# Patient Record
Sex: Female | Born: 1955 | Race: Black or African American | Hispanic: No | Marital: Single | Smoking: Never smoker
Health system: Southern US, Community
[De-identification: ages and names within clinical notes are randomized; demographics above are authoritative.]

## PROBLEM LIST (undated history)

## (undated) DIAGNOSIS — D649 Anemia, unspecified: Secondary | ICD-10-CM

## (undated) DIAGNOSIS — C787 Secondary malignant neoplasm of liver and intrahepatic bile duct: Secondary | ICD-10-CM

## (undated) DIAGNOSIS — R011 Cardiac murmur, unspecified: Secondary | ICD-10-CM

## (undated) DIAGNOSIS — L309 Dermatitis, unspecified: Secondary | ICD-10-CM

## (undated) DIAGNOSIS — I10 Essential (primary) hypertension: Secondary | ICD-10-CM

## (undated) DIAGNOSIS — Z923 Personal history of irradiation: Secondary | ICD-10-CM

## (undated) DIAGNOSIS — Z9289 Personal history of other medical treatment: Secondary | ICD-10-CM

## (undated) DIAGNOSIS — K921 Melena: Secondary | ICD-10-CM

## (undated) DIAGNOSIS — C801 Malignant (primary) neoplasm, unspecified: Secondary | ICD-10-CM

## (undated) DIAGNOSIS — C189 Malignant neoplasm of colon, unspecified: Secondary | ICD-10-CM

## (undated) DIAGNOSIS — G709 Myoneural disorder, unspecified: Secondary | ICD-10-CM

## (undated) DIAGNOSIS — F419 Anxiety disorder, unspecified: Secondary | ICD-10-CM

## (undated) HISTORY — PX: ABDOMINAL HYSTERECTOMY: SHX81

## (undated) HISTORY — PX: BREAST LUMPECTOMY: SHX2

## (undated) HISTORY — DX: Malignant (primary) neoplasm, unspecified: C80.1

---

## 1997-08-23 ENCOUNTER — Encounter: Admission: RE | Admit: 1997-08-23 | Discharge: 1997-08-23 | Payer: Self-pay | Admitting: Hematology and Oncology

## 1997-09-12 ENCOUNTER — Encounter: Admission: RE | Admit: 1997-09-12 | Discharge: 1997-09-12 | Payer: Self-pay | Admitting: Internal Medicine

## 1997-12-05 ENCOUNTER — Encounter: Admission: RE | Admit: 1997-12-05 | Discharge: 1997-12-05 | Payer: Self-pay | Admitting: Internal Medicine

## 1998-11-13 ENCOUNTER — Emergency Department (HOSPITAL_COMMUNITY): Admission: EM | Admit: 1998-11-13 | Discharge: 1998-11-13 | Payer: Self-pay | Admitting: Emergency Medicine

## 1998-11-13 ENCOUNTER — Encounter: Payer: Self-pay | Admitting: Emergency Medicine

## 1999-01-01 ENCOUNTER — Encounter: Admission: RE | Admit: 1999-01-01 | Discharge: 1999-01-01 | Payer: Self-pay | Admitting: Internal Medicine

## 1999-02-28 ENCOUNTER — Emergency Department (HOSPITAL_COMMUNITY): Admission: EM | Admit: 1999-02-28 | Discharge: 1999-02-28 | Payer: Self-pay | Admitting: Emergency Medicine

## 1999-02-28 ENCOUNTER — Encounter: Payer: Self-pay | Admitting: Emergency Medicine

## 1999-03-04 ENCOUNTER — Emergency Department (HOSPITAL_COMMUNITY): Admission: EM | Admit: 1999-03-04 | Discharge: 1999-03-04 | Payer: Self-pay | Admitting: Emergency Medicine

## 1999-05-28 ENCOUNTER — Encounter: Payer: Self-pay | Admitting: Emergency Medicine

## 1999-05-28 ENCOUNTER — Emergency Department (HOSPITAL_COMMUNITY): Admission: EM | Admit: 1999-05-28 | Discharge: 1999-05-28 | Payer: Self-pay | Admitting: Emergency Medicine

## 1999-05-30 ENCOUNTER — Encounter: Admission: RE | Admit: 1999-05-30 | Discharge: 1999-05-30 | Payer: Self-pay | Admitting: Internal Medicine

## 1999-07-03 ENCOUNTER — Encounter: Admission: RE | Admit: 1999-07-03 | Discharge: 1999-07-03 | Payer: Self-pay | Admitting: Internal Medicine

## 1999-10-29 ENCOUNTER — Emergency Department (HOSPITAL_COMMUNITY): Admission: EM | Admit: 1999-10-29 | Discharge: 1999-10-29 | Payer: Self-pay | Admitting: *Deleted

## 1999-10-29 ENCOUNTER — Encounter: Payer: Self-pay | Admitting: Emergency Medicine

## 1999-11-05 ENCOUNTER — Encounter: Admission: RE | Admit: 1999-11-05 | Discharge: 1999-11-05 | Payer: Self-pay | Admitting: Hematology and Oncology

## 2000-06-16 ENCOUNTER — Encounter: Admission: RE | Admit: 2000-06-16 | Discharge: 2000-06-16 | Payer: Self-pay | Admitting: Internal Medicine

## 2000-07-29 ENCOUNTER — Encounter: Admission: RE | Admit: 2000-07-29 | Discharge: 2000-07-29 | Payer: Self-pay | Admitting: Internal Medicine

## 2000-07-29 ENCOUNTER — Ambulatory Visit (HOSPITAL_COMMUNITY): Admission: RE | Admit: 2000-07-29 | Discharge: 2000-07-29 | Payer: Self-pay | Admitting: Internal Medicine

## 2000-07-29 ENCOUNTER — Encounter: Payer: Self-pay | Admitting: Internal Medicine

## 2000-08-28 ENCOUNTER — Emergency Department (HOSPITAL_COMMUNITY): Admission: EM | Admit: 2000-08-28 | Discharge: 2000-08-28 | Payer: Self-pay | Admitting: Emergency Medicine

## 2000-09-24 ENCOUNTER — Emergency Department (HOSPITAL_COMMUNITY): Admission: EM | Admit: 2000-09-24 | Discharge: 2000-09-24 | Payer: Self-pay | Admitting: Emergency Medicine

## 2000-10-23 ENCOUNTER — Emergency Department (HOSPITAL_COMMUNITY): Admission: EM | Admit: 2000-10-23 | Discharge: 2000-10-23 | Payer: Self-pay | Admitting: *Deleted

## 2000-12-28 ENCOUNTER — Emergency Department (HOSPITAL_COMMUNITY): Admission: EM | Admit: 2000-12-28 | Discharge: 2000-12-28 | Payer: Self-pay | Admitting: *Deleted

## 2000-12-31 ENCOUNTER — Encounter: Admission: RE | Admit: 2000-12-31 | Discharge: 2000-12-31 | Payer: Self-pay | Admitting: Internal Medicine

## 2001-01-01 ENCOUNTER — Emergency Department (HOSPITAL_COMMUNITY): Admission: EM | Admit: 2001-01-01 | Discharge: 2001-01-01 | Payer: Self-pay

## 2001-01-04 ENCOUNTER — Encounter: Admission: RE | Admit: 2001-01-04 | Discharge: 2001-01-04 | Payer: Self-pay | Admitting: Internal Medicine

## 2001-05-18 ENCOUNTER — Emergency Department (HOSPITAL_COMMUNITY): Admission: EM | Admit: 2001-05-18 | Discharge: 2001-05-18 | Payer: Self-pay | Admitting: Emergency Medicine

## 2001-08-02 ENCOUNTER — Encounter: Admission: RE | Admit: 2001-08-02 | Discharge: 2001-08-02 | Payer: Self-pay

## 2002-01-22 ENCOUNTER — Emergency Department (HOSPITAL_COMMUNITY): Admission: EM | Admit: 2002-01-22 | Discharge: 2002-01-22 | Payer: Self-pay | Admitting: Emergency Medicine

## 2002-03-21 ENCOUNTER — Emergency Department (HOSPITAL_COMMUNITY): Admission: EM | Admit: 2002-03-21 | Discharge: 2002-03-21 | Payer: Self-pay | Admitting: Emergency Medicine

## 2002-03-22 ENCOUNTER — Encounter: Payer: Self-pay | Admitting: Emergency Medicine

## 2002-03-28 ENCOUNTER — Emergency Department (HOSPITAL_COMMUNITY): Admission: EM | Admit: 2002-03-28 | Discharge: 2002-03-28 | Payer: Self-pay | Admitting: Unknown Physician Specialty

## 2002-05-06 ENCOUNTER — Emergency Department (HOSPITAL_COMMUNITY): Admission: EM | Admit: 2002-05-06 | Discharge: 2002-05-06 | Payer: Self-pay | Admitting: Emergency Medicine

## 2002-05-06 ENCOUNTER — Encounter: Payer: Self-pay | Admitting: Emergency Medicine

## 2002-06-20 ENCOUNTER — Other Ambulatory Visit: Admission: RE | Admit: 2002-06-20 | Discharge: 2002-06-20 | Payer: Self-pay | Admitting: Obstetrics and Gynecology

## 2002-06-20 ENCOUNTER — Encounter: Admission: RE | Admit: 2002-06-20 | Discharge: 2002-06-20 | Payer: Self-pay | Admitting: *Deleted

## 2002-06-26 ENCOUNTER — Ambulatory Visit (HOSPITAL_COMMUNITY): Admission: RE | Admit: 2002-06-26 | Discharge: 2002-06-26 | Payer: Self-pay | Admitting: *Deleted

## 2002-07-21 ENCOUNTER — Encounter: Admission: RE | Admit: 2002-07-21 | Discharge: 2002-07-21 | Payer: Self-pay | Admitting: Family Medicine

## 2002-10-06 ENCOUNTER — Encounter: Admission: RE | Admit: 2002-10-06 | Discharge: 2002-10-06 | Payer: Self-pay | Admitting: Internal Medicine

## 2002-11-09 ENCOUNTER — Emergency Department (HOSPITAL_COMMUNITY): Admission: EM | Admit: 2002-11-09 | Discharge: 2002-11-09 | Payer: Self-pay | Admitting: Emergency Medicine

## 2002-12-20 ENCOUNTER — Encounter: Payer: Self-pay | Admitting: Emergency Medicine

## 2002-12-20 ENCOUNTER — Emergency Department (HOSPITAL_COMMUNITY): Admission: EM | Admit: 2002-12-20 | Discharge: 2002-12-20 | Payer: Self-pay | Admitting: Emergency Medicine

## 2002-12-28 ENCOUNTER — Encounter: Admission: RE | Admit: 2002-12-28 | Discharge: 2002-12-28 | Payer: Self-pay | Admitting: Internal Medicine

## 2003-02-20 ENCOUNTER — Inpatient Hospital Stay (HOSPITAL_COMMUNITY): Admission: AD | Admit: 2003-02-20 | Discharge: 2003-02-20 | Payer: Self-pay | Admitting: *Deleted

## 2003-03-19 ENCOUNTER — Inpatient Hospital Stay (HOSPITAL_COMMUNITY): Admission: AD | Admit: 2003-03-19 | Discharge: 2003-03-21 | Payer: Self-pay | Admitting: Obstetrics & Gynecology

## 2003-03-27 ENCOUNTER — Encounter: Admission: RE | Admit: 2003-03-27 | Discharge: 2003-03-27 | Payer: Self-pay | Admitting: Obstetrics and Gynecology

## 2003-03-27 ENCOUNTER — Other Ambulatory Visit: Admission: RE | Admit: 2003-03-27 | Discharge: 2003-03-27 | Payer: Self-pay | Admitting: Obstetrics & Gynecology

## 2003-03-27 ENCOUNTER — Encounter (INDEPENDENT_AMBULATORY_CARE_PROVIDER_SITE_OTHER): Payer: Self-pay

## 2003-03-30 ENCOUNTER — Inpatient Hospital Stay (HOSPITAL_COMMUNITY): Admission: AD | Admit: 2003-03-30 | Discharge: 2003-03-30 | Payer: Self-pay | Admitting: Family Medicine

## 2003-04-03 ENCOUNTER — Encounter: Admission: RE | Admit: 2003-04-03 | Discharge: 2003-04-03 | Payer: Self-pay | Admitting: Obstetrics & Gynecology

## 2003-04-04 ENCOUNTER — Encounter: Admission: RE | Admit: 2003-04-04 | Discharge: 2003-04-04 | Payer: Self-pay | Admitting: Internal Medicine

## 2003-04-09 ENCOUNTER — Encounter: Admission: RE | Admit: 2003-04-09 | Discharge: 2003-04-09 | Payer: Self-pay | Admitting: Internal Medicine

## 2003-04-17 ENCOUNTER — Encounter: Admission: RE | Admit: 2003-04-17 | Discharge: 2003-04-17 | Payer: Self-pay | Admitting: Obstetrics and Gynecology

## 2003-05-02 ENCOUNTER — Ambulatory Visit (HOSPITAL_COMMUNITY): Admission: RE | Admit: 2003-05-02 | Discharge: 2003-05-02 | Payer: Self-pay | Admitting: Internal Medicine

## 2003-05-02 ENCOUNTER — Encounter: Payer: Self-pay | Admitting: Cardiovascular Disease

## 2003-05-29 ENCOUNTER — Encounter: Admission: RE | Admit: 2003-05-29 | Discharge: 2003-05-29 | Payer: Self-pay | Admitting: Obstetrics and Gynecology

## 2003-06-07 ENCOUNTER — Encounter: Admission: RE | Admit: 2003-06-07 | Discharge: 2003-06-07 | Payer: Self-pay | Admitting: Internal Medicine

## 2003-06-17 ENCOUNTER — Emergency Department (HOSPITAL_COMMUNITY): Admission: EM | Admit: 2003-06-17 | Discharge: 2003-06-17 | Payer: Self-pay | Admitting: Emergency Medicine

## 2003-07-13 ENCOUNTER — Ambulatory Visit (HOSPITAL_COMMUNITY): Admission: RE | Admit: 2003-07-13 | Discharge: 2003-07-13 | Payer: Self-pay | Admitting: Endocrinology

## 2003-09-04 ENCOUNTER — Inpatient Hospital Stay (HOSPITAL_COMMUNITY): Admission: RE | Admit: 2003-09-04 | Discharge: 2003-09-08 | Payer: Self-pay | Admitting: Obstetrics

## 2003-09-04 ENCOUNTER — Encounter (INDEPENDENT_AMBULATORY_CARE_PROVIDER_SITE_OTHER): Payer: Self-pay | Admitting: Specialist

## 2004-02-05 ENCOUNTER — Ambulatory Visit: Payer: Self-pay | Admitting: Internal Medicine

## 2004-08-05 ENCOUNTER — Ambulatory Visit: Payer: Self-pay | Admitting: Internal Medicine

## 2004-08-17 ENCOUNTER — Ambulatory Visit: Payer: Self-pay | Admitting: Internal Medicine

## 2004-08-17 ENCOUNTER — Inpatient Hospital Stay (HOSPITAL_COMMUNITY): Admission: EM | Admit: 2004-08-17 | Discharge: 2004-08-19 | Payer: Self-pay | Admitting: Emergency Medicine

## 2004-08-26 ENCOUNTER — Ambulatory Visit: Payer: Self-pay | Admitting: Internal Medicine

## 2004-11-28 ENCOUNTER — Emergency Department (HOSPITAL_COMMUNITY): Admission: EM | Admit: 2004-11-28 | Discharge: 2004-11-28 | Payer: Self-pay | Admitting: Emergency Medicine

## 2005-05-19 ENCOUNTER — Ambulatory Visit: Payer: Self-pay | Admitting: Internal Medicine

## 2005-05-25 ENCOUNTER — Ambulatory Visit: Payer: Self-pay | Admitting: Internal Medicine

## 2005-05-25 ENCOUNTER — Ambulatory Visit (HOSPITAL_COMMUNITY): Admission: RE | Admit: 2005-05-25 | Discharge: 2005-05-25 | Payer: Self-pay | Admitting: Internal Medicine

## 2005-06-10 ENCOUNTER — Ambulatory Visit: Payer: Self-pay | Admitting: Internal Medicine

## 2006-01-24 ENCOUNTER — Emergency Department (HOSPITAL_COMMUNITY): Admission: EM | Admit: 2006-01-24 | Discharge: 2006-01-24 | Payer: Self-pay | Admitting: Emergency Medicine

## 2006-05-28 DIAGNOSIS — D259 Leiomyoma of uterus, unspecified: Secondary | ICD-10-CM | POA: Insufficient documentation

## 2006-05-28 DIAGNOSIS — D649 Anemia, unspecified: Secondary | ICD-10-CM | POA: Insufficient documentation

## 2006-05-28 DIAGNOSIS — I10 Essential (primary) hypertension: Secondary | ICD-10-CM | POA: Insufficient documentation

## 2006-05-28 DIAGNOSIS — E669 Obesity, unspecified: Secondary | ICD-10-CM

## 2006-05-28 DIAGNOSIS — R011 Cardiac murmur, unspecified: Secondary | ICD-10-CM

## 2006-06-01 ENCOUNTER — Encounter (INDEPENDENT_AMBULATORY_CARE_PROVIDER_SITE_OTHER): Payer: Self-pay | Admitting: *Deleted

## 2006-06-01 ENCOUNTER — Ambulatory Visit: Payer: Self-pay | Admitting: Internal Medicine

## 2006-06-01 DIAGNOSIS — L259 Unspecified contact dermatitis, unspecified cause: Secondary | ICD-10-CM

## 2006-06-01 DIAGNOSIS — M1990 Unspecified osteoarthritis, unspecified site: Secondary | ICD-10-CM | POA: Insufficient documentation

## 2006-06-01 LAB — CONVERTED CEMR LAB
ALT: 23 units/L (ref 0–35)
AST: 25 units/L (ref 0–37)
Albumin: 4.4 g/dL (ref 3.5–5.2)
Alkaline Phosphatase: 58 units/L (ref 39–117)
BUN: 19 mg/dL (ref 6–23)
Bilirubin Urine: NEGATIVE
CO2: 24 meq/L (ref 19–32)
Calcium: 9.7 mg/dL (ref 8.4–10.5)
Chloride: 106 meq/L (ref 96–112)
Cholesterol: 220 mg/dL — ABNORMAL HIGH (ref 0–200)
Creatinine, Ser: 0.81 mg/dL (ref 0.40–1.20)
Creatinine, Urine: 215.9 mg/dL
Glucose, Bld: 83 mg/dL (ref 70–99)
HDL: 65 mg/dL (ref >39)
Hemoglobin, Urine: NEGATIVE
Ketones, ur: NEGATIVE mg/dL
LDL Cholesterol: 140 mg/dL — ABNORMAL HIGH (ref 0–99)
Leukocytes, UA: NEGATIVE
Microalb Creat Ratio: 4.6 mg/g (ref 0.0–30.0)
Microalb, Ur: 1 mg/dL (ref 0.00–1.89)
Nitrite: NEGATIVE
Potassium: 4.1 meq/L (ref 3.5–5.3)
Protein, ur: NEGATIVE mg/dL
Sodium: 141 meq/L (ref 135–145)
Specific Gravity, Urine: 1.031 (ref 1.005–1.03)
Total Bilirubin: 0.7 mg/dL (ref 0.3–1.2)
Total CHOL/HDL Ratio: 3.4
Total Protein: 7.6 g/dL (ref 6.0–8.3)
Triglycerides: 77 mg/dL (ref <150)
Urine Glucose: NEGATIVE mg/dL
Urobilinogen, UA: 0.2 (ref 0.0–1.0)
VLDL: 15 mg/dL (ref 0–40)
pH: 6 (ref 5.0–8.0)

## 2006-07-28 ENCOUNTER — Telehealth: Payer: Self-pay | Admitting: *Deleted

## 2007-01-24 ENCOUNTER — Telehealth: Payer: Self-pay | Admitting: *Deleted

## 2007-01-24 ENCOUNTER — Ambulatory Visit: Payer: Self-pay | Admitting: Internal Medicine

## 2007-01-24 DIAGNOSIS — R519 Headache, unspecified: Secondary | ICD-10-CM | POA: Insufficient documentation

## 2007-01-24 DIAGNOSIS — R51 Headache: Secondary | ICD-10-CM

## 2007-01-24 DIAGNOSIS — H0520 Unspecified exophthalmos: Secondary | ICD-10-CM | POA: Insufficient documentation

## 2007-01-25 LAB — CONVERTED CEMR LAB
BUN: 17 mg/dL (ref 6–23)
CO2: 22 meq/L (ref 19–32)
Calcium: 9.8 mg/dL (ref 8.4–10.5)
Chloride: 103 meq/L (ref 96–112)
Creatinine, Ser: 0.92 mg/dL (ref 0.40–1.20)
Glucose, Bld: 90 mg/dL (ref 70–99)
HCT: 39.3 % (ref 36.0–46.0)
Hemoglobin: 12.6 g/dL (ref 12.0–15.0)
MCHC: 32.1 g/dL (ref 30.0–36.0)
MCV: 89.7 fL (ref 78.0–100.0)
Platelets: 200 10*3/uL (ref 150–400)
Potassium: 4.7 meq/L (ref 3.5–5.3)
RBC: 4.38 M/uL (ref 3.87–5.11)
RDW: 14 % (ref 11.5–14.0)
Sodium: 140 meq/L (ref 135–145)
TSH: 0.905 microintl units/mL (ref 0.350–5.50)
WBC: 8.9 10*3/uL (ref 4.0–10.5)

## 2007-01-26 ENCOUNTER — Ambulatory Visit: Payer: Self-pay | Admitting: Internal Medicine

## 2007-01-26 ENCOUNTER — Encounter (INDEPENDENT_AMBULATORY_CARE_PROVIDER_SITE_OTHER): Payer: Self-pay | Admitting: Infectious Diseases

## 2007-01-28 LAB — CONVERTED CEMR LAB
Rheumatoid fact SerPl-aCnc: <20 intl units/mL (ref 0–20)
Sed Rate: 32 mm/hr — ABNORMAL HIGH (ref 0–22)

## 2007-02-17 ENCOUNTER — Telehealth (INDEPENDENT_AMBULATORY_CARE_PROVIDER_SITE_OTHER): Payer: Self-pay | Admitting: *Deleted

## 2010-02-03 ENCOUNTER — Emergency Department (HOSPITAL_COMMUNITY): Admission: EM | Admit: 2010-02-03 | Discharge: 2010-02-03 | Payer: Self-pay | Admitting: Emergency Medicine

## 2010-05-17 ENCOUNTER — Encounter: Payer: Self-pay | Admitting: Obstetrics

## 2010-05-17 ENCOUNTER — Encounter: Payer: Self-pay | Admitting: *Deleted

## 2010-05-18 ENCOUNTER — Encounter: Payer: Self-pay | Admitting: Obstetrics

## 2010-09-12 NOTE — Op Note (Signed)
NAME:  [name], [name]                          ACCOUNT NO.:  [id number]   MEDICAL RECORD NO.:  [id number]                   PATIENT TYPE:  INP   LOCATION:  9306                                 [facility]   PHYSICIAN:  Kathreen Cosier, M.D.           DATE OF BIRTH:  14-Jan-1956   DATE OF PROCEDURE:  09/04/2003  DATE OF DISCHARGE:                                 OPERATIVE REPORT   PREOPERATIVE DIAGNOSES:  1. Anemia.  2. Large myoma uteri.   POSTOPERATIVE DIAGNOSES:  1. Anemia.  2. Large myoma uteri.   SURGEON:  Kathreen Cosier, M.D.   FIRST ASSISTANT:  Leonie Man, M.D.   DESCRIPTION OF PROCEDURE:  The patient was placed on the operating room  table in a supine position.  General anesthesia administered.  Abdomen  prepped and draped.  Bladder emptied with a Foley catheter.  A transverse  suprapubic incision was made and carried down through the rectus fascia.  The fascia cleaned and incised the length of the incision.  The recti  muscles were retracted laterally and the peritoneum incised longitudinally.  She had a 20-22 week size myoma, and this was delivered from the abdominal  cavity.  The left ovary was noted to be enlarged, approximately 6 cm.  The  right round ligament was clasped with a Kelly clamp, cut and suture ligated  with #1 chromic.  Procedure done in a similar fashion on the other side.  Using the Metzenbaum  scissors, a bladder flap was developed and the bladder  dissected off of the cervix.  The right infundibulopelvic ligament was  grasped with a Kelly clamp, cut and suture ligated with #1 chromic.  The  procedure was done in a similar fashion on the other side.  The uterine  vessels were skeletonized bilaterally, double clamped with Heaney clamps on  the right, cut and suture ligated with #1 chromic.  The procedure was done  in a similar fashion on the other side.  A specimen was cut with some tubes  and ovaries removed with the scalpel prior to  removal of the cervix.  The  cervix was grasped with Kocher and then using straight Kocher clamp the  cardinal and the uterosacral ligaments grasped on the right, cut and suture  ligated with #1 chromic.  The procedure done in a similar fashion on the  other side.  The cervix was extremely long, and three bites were placed on  the right and left side prior to removal of the cervix.  After the  uterosacral and then cardinals were cut and suture ligated, the cervix was  removed at the cervical-vaginal junction with Mayo scissors.  Modified  Richardson sutures were placed in angles of the vagina, and then the vaginal  vault run with interlocking suture of #1 chromic.  There was some bleeding  from the bladder and the vault, and hemostasis was achieved with interrupted  sutures  of #1 chromic.  The hemostasis was satisfactory.  The operative  sites reperitonealized with 2-0 chromic.   ESTIMATED BLOOD LOSS:  Approximately 1000 cc.   DISPOSITION:  The lap and sponge counts were correct.  The abdomen closed in  layers.  The peritoneum closed with continuous suture of 0 chromic.  The  fascia closed in suture of 0 Dexon and the skin closed with subcuticular  stitch of 4-0 Monocryl.                                               Kathreen Cosier, M.D.    BAM/MEDQ  D:  09/04/2003  T:  09/04/2003  Job:  [id number]

## 2010-09-12 NOTE — Discharge Summary (Signed)
NAME:  [name], [name]                          ACCOUNT NO.:  [id number]   MEDICAL RECORD NO.:  [id number]                   PATIENT TYPE:  INP   LOCATION:  9306                                 [facility]   PHYSICIAN:  Kathreen Cosier, M.D.           DATE OF BIRTH:  06/24/1955   DATE OF ADMISSION:  09/04/2003  DATE OF DISCHARGE:  09/08/2003                                 DISCHARGE SUMMARY   HOSPITAL COURSE:  The patient is a 55 year old gravida 2 para 2-0-0-2,  history of heavy periods, on iron.  She has to use a tampon and pad.  Endometrial biopsy was negative.  She had large myomas and was offered  embolization of the uterine vessels, myomectomy, and a hysterectomy.  The  patient chose hysterectomy.  She has a history of high blood pressure and  she was on hydrochlorothiazide 25 mg p.o. daily.  The patient underwent a  TAH/BSO for a large, about 22-week-size myomas.  Prior to her surgery her  hemoglobin was 8; postoperative it was 6.  The patient was symptomatic and  was transfused 2 units of packed cells after it was explained to her the  risk of HIV and hepatitis.  Hemoglobin post transfusion was 7.8.  She felt  fine and was discharged home on postoperative day #4, ambulatory, on a  regular diet, on Tylox for pain and ferrous sulfate for anemia.   DISCHARGE DIAGNOSIS:  Status post total abdominal hysterectomy/bilateral  salpingo-oophorectomy for myoma uteri and transfusion for anemia.                                               Kathreen Cosier, M.D.    BAM/MEDQ  D:  09/19/2003  T:  09/19/2003  Job:  [id number]

## 2010-09-12 NOTE — Discharge Summary (Signed)
[name], [name]                ACCOUNT NO.:  [id number]   MEDICAL RECORD NO.:  [id number]          PATIENT TYPE:  INP   LOCATION:  4730                         FACILITY:  [facility]   PHYSICIAN:  Alvester Morin, M.D.  DATE OF BIRTH:  07-16-1955   DATE OF ADMISSION:  08/17/2004  DATE OF DISCHARGE:  08/19/2004                                 DISCHARGE SUMMARY   DISCHARGE DIAGNOSES:  1.  Chest pain with negative cardiac enzymes and a negative stress      Cardiolite.  2.  Hypertension.  3.  Osteoarthritis of bilateral knees.  4.  Status post hysterectomy secondary to menorrhagia and fibroid uterus in      2005.  5.  History of heart murmur with echocardiogram in January 2005 showing      ejection fraction of 55-65%, mildly dilated left ventricle, mildly to      moderately enlarged left wall thickness and mildly dilated left atrium.   DISCHARGE MEDICATIONS:  1.  HCTZ 25 mg daily.  2.  Ambien 5 mg p.o. q.h.s.   PROCEDURE PERFORMED:  1.  Stress Cardiolite performed August 19, 2004, that was negative per EKG      criteria and no obvious ischemia with preserved ejection fraction of      61%.   DISPOSITION AND FOLLOW-UP:  The patient will follow-up with Dr. Reyes Ivan on  Sep 18, 2004, at 3:30 p.m.Marland Kitchen She will follow up with Dr. Darnelle Catalan at Dr. York Ram  next available appointment. It has been stressed to her the importance of  risk factor reduction including weight loss low fat diet, etc.   BRIEF ADMISSION HISTORY AND PHYSICAL:  The patient is a 55 year old black  female with history of hypertension comes in with a complaints of chest pain  and feeling like she was going to pass out and chest tightness on the  morning of admission while walking to the bus stop.  She reports that she  felt some pain in her left chest without nausea, vomiting, diaphoresis or  radiation. She felt chest tightness as well and described it similar to  indigestion. After cessation of activities, the pain eased somewhat  and then  came back as she tried to walk again. She notes that she has felt chest  tightness off and on for several months. She denies any typical exacerbating  factors. She denies dyspnea on exertion or baseline shortness of breath. She  does report that she feels short  of breath with anxiety and has had more  stressors at home than usual secondary to her children and multiple  grandchildren who are currently living at home and not providing any  financial support. Her cardiac risk factors include obesity, hypertension,  questionable family history with a father with CHF and possible MI in the  past. She is a postmenopausal female with a recent hysterectomy -  oophorectomy.   PHYSICAL EXAM:  VITAL SIGNS:  Temperature 97.6, pulse 78, blood pressure  150/89, respirations 24, oxygen sat 100% on room air.  GENERAL:  She is obese black female in no acute distress.  HEENT:  Pupils equal, round and reactive to light and accommodation.  Extraocular movements intact. Sclera nonicteric. Oropharynx is clear with  moist mucous membranes.  NECK:  Supple with no thyromegaly.  CHEST:  Clear to auscultation bilaterally without wheeze.  CARDIAC:  Shows regular rate and rhythm with 2/6 systolic murmur at the  right upper sternal border.  ABDOMEN:  Obese, soft, nontender, nondistended, positive bowel sounds.  EXTREMITIES:  No edema.  NEURO:  Cranial nerves II-XII intact bilaterally. Strength 4/5 globally  sensation intact.  PSYCH:  Appropriate.   Admission point of care markers negative x2.   Chest x-ray shows no acute disease.   EKG showed normal sinus rhythm with no ST - T-wave changes.   HOSPITAL COURSE BY ACTIVE PROBLEM:  PROBLEM #1 - CHEST PAIN:  With her  history of chest pressure with exertion and some risk factors for heart  disease, we felt it was appropriate to get a cardiology consult and Dr.  Reyes Ivan saw the patient and felt that it would be appropriate to do a stress  Cardiolite on  her. Of note, the patient had no more episodes of chest pain  throughout her admission. She was able to do her stress Cardiolite and with  a peak heart rate 157 beats per minute. She showed no signs of inducible  ischemia, actually felt much better than admission and fell and she needed  the rest and time off. Since she had had a fasting lipid panel in October  2005 which showed no significant signs of hyperlipidemia, no changes were  made to her medication profile of just hypertension reduction. She was  treated with an aspirin here in the hospital and we will defer to her  primary care physician and Dr. Reyes Ivan as to whether or not he would like to  continue her on an aspirin.   PROBLEM #2 -  HYPERTENSION:  The patient was kept on her HCTZ only and had  excellent control of her blood pressure with her blood pressure ranging 101  and 132 over 63-73. We did not feel she needs any further titration of her  medications at this point.   PROBLEM #3 -  CARDIAC RISK FACTORS:  Will encourage the patient to work on  weight loss and a low-fat diet. She does not smoke and she will follow-up  with cardiology and with Dr. Darnelle Catalan as needed.   DISCHARGE LAB RESULTS:  On the day prior to discharge the patient had a  white count of 7.1, hemoglobin 12.1, hematocrit 34.8, platelet 230. Basic  metabolic profile shows sodium 528, potassium 4.0, chloride 103, CO2 25,  glucose 97, BUN 13, creatinine 0.9, calcium 9.6.  Her CK was mildly elevated  in the 400s, but her CK-MB was appropriate and troponin was 0.01 or less.  Urinalysis showed trace leukocyte esterase only with no white cells and  no bacteria. Urine drug screen was negative. D-dimer was mildly elevated at  0.62.  A BNP was less than 30. TSH 0.947.   She has been afebrile with stable vital signs throughout this admission.      WW/MEDQ  D:  08/19/2004  T:  08/19/2004  Job:  [id number]  cc:   Hillery Aldo, M.D.  Int. Med. - Resident - [address]   [place], [place] [address]  Fax: [phone]   Elmore Guise., M.D.  [address]  [place], [place] [address]  Fax: [phone]

## 2010-09-12 NOTE — Consult Note (Signed)
[name], [name]                ACCOUNT NO.:  [id number]   MEDICAL RECORD NO.:  [id number]          PATIENT TYPE:  INP   LOCATION:  4730                         FACILITY:  [facility]   PHYSICIAN:  Elmore Guise., M.D.DATE OF BIRTH:  23-Feb-1956   DATE OF CONSULTATION:  08/18/2004  DATE OF DISCHARGE:                                   CONSULTATION   REASON FOR CONSULT:  Left-sided chest pain.   HISTORY OF PRESENT ILLNESS:  The patient is a very pleasant 55 year old Afro-  American female with a past medical history of hypertension, obesity, family  history of coronary disease in her father who was admitted with left  anterior chest pain.  The patient actually reports a normal state of health  over the last couple of weeks, and on Sunday she was getting ready to go to  work, she asked her son to drive her.  She started noticing left anterior  chest pain which she describes as indigestion-like and, like I needed to  burp.  She reported her chest pain was off and on as she was driving to  work.  She was then let out of the car, was walking up the sidewalk,  continued with chest pain, stopped.  Her chest pain did get a little better,  however continued.  She was then taken to the emergency department for  further evaluation.  She states the total time of her chest pain lasted  approximately 20-30 minutes when she arrived to the emergency room.  She  reports relief of her chest pain with oxygen and an anxiety pill.  She  also notes increased stress at home with her son.  Otherwise, she reports  exertional shortness of breath and a history of chest pain in the past which  thought was secondary to anxiety problems.   REVIEW OF SYSTEMS:  Positive for a 20 pound weight gain and knee and joint  pain.  All other review of systems are negative.   CURRENT MEDICATIONS:  Hydrochlorothiazide 25 mg daily.   ALLERGIES:  NONE.   FAMILY HISTORY:  Positive for coronary disease, CHF and peripheral  vascular  disease in her father.  Mother died from breast cancer.   PAST SURGICAL HISTORY:  Includes hysterectomy in 2004.   SOCIAL HISTORY:  She is widowed.  No tobacco, no alcohol.  She does have two  grown children.  She works at [employer] in [employer].   EXAM:  She is afebrile.  Temperature is 96.9, heart rate 62, respirations  are 20, blood pressure is 100/67.  In general, she is a very pleasant middle-  aged African-American female alert and oriented x4, in no acute distress.  NECK:  Supple, no lymphadenopathy, 2+ carotids.  No JVD, no bruits.  LUNGS:  Clear.  HEART:  Regular, however distant with a normal S1, S2.  ABDOMEN:  Obese, soft, nontender, nondistended.  No rebound or guarding.  EXTREMITIES:  Warm with 2+ pulses and no edema.  NEUROLOGICALLY:  IS Intact, no focal deficits.   LABORATORY VALUES:  Show a hemoglobin of 12.2, white count  of 7.1, platelets  of 230, D-dimer of 0.62, BUN and creatinine of 13 and 0.9.  Cardiac enzymes,  CPK are 457 to 400 to 374, MB is 4.4, 3.6 and 3.0, troponin-I is less than  0.01 x3, BNP is less than 30.  ECG shows normal sinus rhythm, normal axis,  no significant ST or T-wave changes.  Chest x-ray shows no acute  cardiopulmonary disease.  An echocardiogram done May 02, 2003, showed an  EF of 55-60% with mild LVH, no significant wall motion abnormalities and no  significant valvular heart disease.   IMPRESSION:  1.  Chest pain.  2.  Post menopausal female with past medical history of hypertension, and      obesity and family history of heart disease.   PLAN:  I agree with current treatment with aspirin and blood pressure  management.  Due to patient's symptoms, would recommend stress Myoview,  patient will be made n.p.o. after midnight for a stress test in the morning.  If low risk, I would recommend weight loss risk factor modification, if  moderate to high risk then cardiac catheterization; this was discussed with   patient extensively.  Please call with any further questions or concerns.      TWK/MEDQ  D:  08/18/2004  T:  08/18/2004  Job:  [id number]

## 2010-09-12 NOTE — Group Therapy Note (Signed)
NAME:  [name], HINCH                          ACCOUNT NO.:  [id number]   MEDICAL RECORD NO.:  [id number]                   PATIENT TYPE:  OUT   LOCATION:  [facility]                           FACILITY:  [facility]   PHYSICIAN:  Elsie Lincoln, MD                   DATE OF BIRTH:  15-Sep-1955   DATE OF SERVICE:  03/27/2003                                    CLINIC NOTE   CHIEF COMPLAINT:  A 55 year old female who presents after admission to the  hospital for menorrhagia.  The patient had a hemoglobin of 4.9 and abdominal  pain.  She received 4 units of packed red cells which increased her  hemoglobin to 9.  She was given several doses of IV Premarin and then  started on p.o. Provera 10 mg a day.  Also during her hospital stay she  received a three-month dose of Lupron IM to prepare her for hysterectomy.  The patient has no insurance but she is enrolling in [facility]  program.  Today, the patient presents for endometrial biopsy.  Last Pap  smear was February 2004 and was normal.  The patient has had a mammogram at  some point but cannot remember when; she said it was normal.  She has a  history of a left breast biopsy that was negative.   PAST MEDICAL PROBLEMS:  Hypertension controlled with hydrochlorothiazide.  The patient needs to follow up with primary care Gusta Marksberry for surgical  clearance.   PAST SURGICAL HISTORY:  Denies.   GYNECOLOGICAL HISTORY:  Dysfunctional uterine bleeding secondary to large  fibroid uterus.  No STDs per patient.  No ovarian cysts.  Left breast biopsy  as described above.   MEDICATIONS:  Hydrochlorothiazide, Provera, Flagyl, and iron.   ALLERGIES:  No known drug allergies.   REVIEW OF SYMPTOMS:  The patient is still experiencing some vaginal  bleeding, not as heavy as before.  No chest pain, shortness of breath,  nausea, vomiting, dizziness.   PHYSICAL EXAMINATION:  VITAL SIGNS:  Today, temperature 98.9, blood pressure  of 122/77, weight is  229.9, height 5 feet 11 inches.  BREASTS:  No masses, nontender, no skin changes.  Well healed scar from left  breast biopsy.  ABDOMEN:  Soft, nontender, nondistended.  Fibroid uterus palpated several  centimeters above the umbilicus.  PELVIC:  External genitalia:  Tanner V.  Vagina:  Small to moderate amount  of blood.  Cervix:  Closed, nontender.  Uterus:  Large secondary to  fibroids.  Adnexa:  Not felt.   ASSESSMENT AND PLAN:  A 55 year old female with dysfunctional uterine  bleeding from fibroids.   1. Endometrial biopsy done today.  2. Stop Provera and start Tri-Levlen 28 one tablet p.o. twice a day for one     week, then one tablet p.o. daily for one pack.  After this, the Lupron     should put her into amenorrhea.  The patient warned about withdrawal     bleeding that can occur two weeks after given Lupron and if extremely     heavy or the patient becomes dizzy, the patient needs to come to the [facility].  3. Mammogram scholarship given so she can have a free mammogram.  4. Follow up with primary care Doloros Kwolek and get medical clearance.  5. Return to clinic in two months or sooner if heavy vaginal bleeding     occurs.                                               Elsie Lincoln, MD    KL/MEDQ  D:  03/27/2003  T:  03/27/2003  Job:  [id number]

## 2010-09-12 NOTE — Group Therapy Note (Signed)
NAME:  [name], [name]                          ACCOUNT NO.:  [id number]   MEDICAL RECORD NO.:  [id number]                   PATIENT TYPE:  OUT   LOCATION:  [facility]                           FACILITY:  [facility]   PHYSICIAN:  Elsie Lincoln, MD                   DATE OF BIRTH:  [birth year]   DATE OF SERVICE:  04/17/2003                                    CLINIC NOTE   REASON FOR VISIT:  The patient is here follow up appointment from April 03, 2003.  She is a 55 year old female who has a known fibroid uterus and  suffers from menorrhagia necessitating four units of packed red blood cell  transfusion.  She received a three-month dose of Lupron approximately the  22nd of November.  She has had some irregular bleeding since.  She was  placed on Ovcon 35 for two weeks; however, she is still continuing it now  and had scant bleeding.  The patient has only had four days of no vaginal  bleeding since Lupron was given in November.  The patient is to stop Ovcon  35 right now.  Lupron should be working well at this point and she should be  amenorrheic very soon.  Her last hemoglobin was done on the 8th and was 8.5.  This is down a little bit from her posttransfusion CBC.  The patient also is  a known hypertensive that is getting follow up care at the outpatient [facility] department.  She is on hydrochlorothiazide and her blood pressure today  is better at 116/78.  Just of note, she tells [place] that the doctor heard a  questionable murmur and looking back on an old ultrasound there is  cardiomegaly.  When medical clearance is received from the internist at  [facility], we need to make sure there is good cardiac clearance before the  surgery.  The patient states she dropped off the medical clearance form with  the doctor and is to be faxed to [place].  If this is not done by early February  this needs to be addressed.  We will schedule hysterectomy for mid February.            Elsie Lincoln, MD    KL/MEDQ  D:  04/17/2003  T:  04/17/2003  Job:  [id number]

## 2010-09-12 NOTE — Group Therapy Note (Signed)
NAME:  [name], [name]                          ACCOUNT NO.:  [id number]   MEDICAL RECORD NO.:  [id number]                   PATIENT TYPE:  OUT   LOCATION:  [facility]                           [facility]ACILITY[facility]   PHYSICIAN:  Elsie Lincoln, MD                   DATE OF BIRTH:  [birth year]   DATE OF SERVICE:  04/03/2003                                    CLINIC NOTE   CHIEF COMPLAINT:  The patient is a 55 year old female who presents for  follow-up for continued vaginal bleeding.  The patient had been admitted in  November for menorrhagia with a hemoglobin of 4.9.  She received 4 units of  packed red blood cells and had a hemoglobin of 9 on discharge.  She had also  received IV Premarin and Provera and one dose of a three-month dose of  Lupron IM.  The patient presented last week with complaints of continued  vaginal bleeding.  The patient was then placed back on OCPs one tablet twice  a day for a week and then one tablet daily for one week.  This would have  gotten her to the two weeks status post Lupron where the Lupron would have  been working.  However, the patient said she continued to have heavy clots  and even was lightheaded yesterday.  This morning the bleeding and signs  subsided but she already this appointment and presents for evaluation.  The  patient is not lightheaded today.   Today, temperature is 99, pulse 86, blood pressure 135/61, weight 235,  height 5 feet 11 inches.  Sterile speculum exam:  Small amount of blood as  well as fishy odor consistent with bacterial vaginosis.   ASSESSMENT AND PLAN:  A 55 year old female with menorrhagia status post  Lupron with menorrhagia resolving.   1. Ovcon 35 one tablet p.o. b.i.d. for 10 days.  2. CBC today.  3. Flagyl 500 mg p.o. b.i.d. for VB.  4. Return to clinic in two weeks.  5. The patient is to get medical clearance for laparotomy and hysterectomy     in two months.  She has a PCP appointment tomorrow where she will be  examined and treated for possible hypertension; blood pressure today     135/61.  The patient was on hydrochlorothiazide but this was stopped     several days ago.  6. Return to clinic in two weeks.                                               Elsie Lincoln, MD    KL/MEDQ  D:  04/03/2003  T:  04/03/2003  Job:  [id number]

## 2011-06-29 ENCOUNTER — Emergency Department (HOSPITAL_COMMUNITY)
Admission: EM | Admit: 2011-06-29 | Discharge: 2011-06-29 | Disposition: A | Discharge status: Home / Self Care | Payer: Self-pay | Attending: Emergency Medicine | Admitting: Emergency Medicine

## 2011-06-29 ENCOUNTER — Other Ambulatory Visit: Payer: Self-pay

## 2011-06-29 ENCOUNTER — Encounter (HOSPITAL_COMMUNITY): Payer: Self-pay | Admitting: Emergency Medicine

## 2011-06-29 DIAGNOSIS — R42 Dizziness and giddiness: Secondary | ICD-10-CM

## 2011-06-29 DIAGNOSIS — D649 Anemia, unspecified: Secondary | ICD-10-CM

## 2011-06-29 DIAGNOSIS — I10 Essential (primary) hypertension: Secondary | ICD-10-CM

## 2011-06-29 HISTORY — DX: Essential (primary) hypertension: I10

## 2011-06-29 LAB — COMPREHENSIVE METABOLIC PANEL
ALT: 18 U/L (ref 0–35)
AST: 19 U/L (ref 0–37)
Albumin: 3.8 g/dL (ref 3.5–5.2)
Alkaline Phosphatase: 74 U/L (ref 39–117)
BUN: 15 mg/dL (ref 6–23)
CO2: 24 mEq/L (ref 19–32)
Calcium: 9.9 mg/dL (ref 8.4–10.5)
Chloride: 104 mEq/L (ref 96–112)
Creatinine, Ser: 0.8 mg/dL (ref 0.50–1.10)
GFR calc Af Amer: >90 mL/min (ref >90)
GFR calc non Af Amer: 81 mL/min — ABNORMAL LOW (ref >90)
Glucose, Bld: 100 mg/dL — ABNORMAL HIGH (ref 70–99)
Potassium: 3.9 mEq/L (ref 3.5–5.1)
Sodium: 139 mEq/L (ref 135–145)
Total Bilirubin: 0.2 mg/dL — ABNORMAL LOW (ref 0.3–1.2)
Total Protein: 7.8 g/dL (ref 6.0–8.3)

## 2011-06-29 LAB — URINALYSIS, ROUTINE W REFLEX MICROSCOPIC
Bilirubin Urine: NEGATIVE
Glucose, UA: NEGATIVE mg/dL
Hgb urine dipstick: NEGATIVE
Ketones, ur: NEGATIVE mg/dL
Nitrite: NEGATIVE
Protein, ur: NEGATIVE mg/dL
Specific Gravity, Urine: 1.023 (ref 1.005–1.030)
Urobilinogen, UA: 0.2 mg/dL (ref 0.0–1.0)
pH: 5.5 (ref 5.0–8.0)

## 2011-06-29 LAB — CBC
HCT: 33.4 % — ABNORMAL LOW (ref 36.0–46.0)
Hemoglobin: 10.9 g/dL — ABNORMAL LOW (ref 12.0–15.0)
MCH: 27.7 pg (ref 26.0–34.0)
MCHC: 32.6 g/dL (ref 30.0–36.0)
MCV: 84.8 fL (ref 78.0–100.0)
Platelets: 236 10*3/uL (ref 150–400)
RBC: 3.94 MIL/uL (ref 3.87–5.11)
RDW: 15.1 % (ref 11.5–15.5)
WBC: 9.6 10*3/uL (ref 4.0–10.5)

## 2011-06-29 LAB — DIFFERENTIAL
Basophils Absolute: 0 10*3/uL (ref 0.0–0.1)
Basophils Relative: 0 % (ref 0–1)
Eosinophils Absolute: 0.3 10*3/uL (ref 0.0–0.7)
Eosinophils Relative: 3 % (ref 0–5)
Lymphocytes Relative: 30 % (ref 12–46)
Lymphs Abs: 2.9 10*3/uL (ref 0.7–4.0)
Monocytes Absolute: 0.5 10*3/uL (ref 0.1–1.0)
Monocytes Relative: 6 % (ref 3–12)
Neutro Abs: 5.8 10*3/uL (ref 1.7–7.7)
Neutrophils Relative %: 61 % (ref 43–77)

## 2011-06-29 LAB — URINE MICROSCOPIC-ADD ON

## 2011-06-29 NOTE — Discharge Instructions (Signed)
Your tests today did not show anything significant except for a mild anemia. Please make sure you drink enough fluids to keep herself appropriately hydrated. Your blood pressure has been elevated today. He should monitor your blood pressure at home. Persistently elevated blood pressure can lead to heart attacks, strokes, and kidney failure. The only way to tell if your blood pressure is properly controlled is to check yourself.  Hypertension As your heart beats, it forces blood through your arteries. This force is your blood pressure. If the pressure is too high, it is called hypertension (HTN) or high blood pressure. HTN is dangerous because you may have it and not know it. High blood pressure may mean that your heart has to work harder to pump blood. Your arteries may be narrow or stiff. The extra work puts you at risk for heart disease, stroke, and other problems.  Blood pressure consists of two numbers, a higher number over a lower, 110/72, for example. It is stated as "110 over 72." The ideal is below 120 for the top number (systolic) and under 80 for the bottom (diastolic). Write down your blood pressure today. You should pay close attention to your blood pressure if you have certain conditions such as:  Heart failure.   Prior heart attack.   Diabetes   Chronic kidney disease.   Prior stroke.   Multiple risk factors for heart disease.  To see if you have HTN, your blood pressure should be measured while you are seated with your arm held at the level of the heart. It should be measured at least twice. A one-time elevated blood pressure reading (especially in the Emergency Department) does not mean that you need treatment. There may be conditions in which the blood pressure is different between your right and left arms. It is important to see your caregiver soon for a recheck. Most people have essential hypertension which means that there is not a specific cause. This type of high blood  pressure may be lowered by changing lifestyle factors such as:  Stress.   Smoking.   Lack of exercise.   Excessive weight.   Drug/tobacco/alcohol use.   Eating less salt.  Most people do not have symptoms from high blood pressure until it has caused damage to the body. Effective treatment can often prevent, delay or reduce that damage. TREATMENT  When a cause has been identified, treatment for high blood pressure is directed at the cause. There are a large number of medications to treat HTN. These fall into several categories, and your caregiver will help you select the medicines that are best for you. Medications may have side effects. You should review side effects with your caregiver. If your blood pressure stays high after you have made lifestyle changes or started on medicines,   Your medication(s) may need to be changed.   Other problems may need to be addressed.   Be certain you understand your prescriptions, and know how and when to take your medicine.   Be sure to follow up with your caregiver within the time frame advised (usually within two weeks) to have your blood pressure rechecked and to review your medications.   If you are taking more than one medicine to lower your blood pressure, make sure you know how and at what times they should be taken. Taking two medicines at the same time can result in blood pressure that is too low.  SEEK IMMEDIATE MEDICAL CARE IF:  You develop a severe headache, blurred  or changing vision, or confusion.   You have unusual weakness or numbness, or a faint feeling.   You have severe chest or abdominal pain, vomiting, or breathing problems.  MAKE SURE YOU:   Understand these instructions.   Will watch your condition.   Will get help right away if you are not doing well or get worse.  Document Released: 04/13/2005 Document Revised: 04/02/2011 Document Reviewed: 12/02/2007 [facility] Patient Information 2012 [place], [place].  RESOURCE  GUIDE  Dental Problems  Patients with Medicaid: [facility] [phone]                                           [phone] [address] Phone:  [phone]                                                  Phone:  [phone]  If unable to pay or uninsured, contact:  [facility] or [facility]. to become qualified for the adult dental clinic.  Insufficient Money for Medicine Contact [employer]:  call "211" or [facility] [phone].  No Primary Care Doctor Call [facility]  [phone] Other agencies that provide inexpensive medical care    [facility]  [phone]    [facility]  [phone]    [facility]  [phone]    [facility]  [phone]    [facility]  [phone]    [facility]  [phone]  Psychological Services [facility] Behavioral [facility]  [phone] [facility]  [phone] [facility]   [phone] (emergency services [phone])  Substance Abuse [facility]  [phone] [facility] [phone] [facility] [phone] Floydene Flock [phone] Residential & Outpatient Substance Abuse Program  [phone]  Abuse/Neglect [facility] Child Abuse Hotline [phone] [facility] Child Abuse Hotline [phone] (After Hours)  Emergency Shelter [facility] [phone]

## 2011-06-29 NOTE — ED Provider Notes (Signed)
History     CSN: [id number]  Arrival date & time 06/29/11  4098   First MD Initiated Contact with Patient 06/29/11 [phone]      Chief Complaint  Patient presents with  . Dizziness  . Headache    (Consider location/radiation/quality/duration/timing/severity/associated sxs/prior treatment) The history is provided by the patient.  She noticed an episode of being lightheaded 4 days ago. This occurred while she was changing buses coming home from work. She was off the next several days so mainly stayed in bed. During that time, she still noticed she got lightheaded if she stood up. Today, she also noted some lightheaded feeling when she stood up so she came in to be evaluated. She describes a vague sensation like a headache is about to start, but she is not actually having a headache currently. She was worried that her blood pressure might be up. She had been on blood pressure medication 25 years ago, but stopped on her own and has not been monitoring her blood pressure. She denies chest pain, heaviness, tightness, or pressure. She denies nausea or vomiting.  Past Medical History  Diagnosis Date  . Hypertension     pt states she stopped taking meds while in her 20's    Past Surgical History  Procedure Date  . Abdominal hysterectomy     History reviewed. No pertinent family history.  History  Substance Use Topics  . Smoking status: Never Smoker   . Smokeless tobacco: Never Used  . Alcohol Use: Yes     occassional    OB History    Grav Para Term Preterm Abortions TAB SAB Ect Mult Living                  Review of Systems  Neurological: Positive for headaches.  All other systems reviewed and are negative.    Allergies  Review of patient's allergies indicates no known allergies.  Home Medications   Current Outpatient Rx  Name Route Sig Dispense Refill  . ASPIRIN 325 MG PO TABS Oral Take 325 mg by mouth daily as needed. For pain      BP 140/100  Pulse 82  Temp(Src)  97.5 F (36.4 C) (Oral)  Resp 16  Ht 5\' 11"  (1.803 m)  Wt 260 lb (117.935 kg)  BMI 36.26 kg/m2  SpO2 100%  Physical Exam  Nursing note and vitals reviewed.  56 year old female who is resting comfortably and in no acute distress. Vital signs are significant for mild hypertension with blood pressure 140/100. Oxygen saturation is 100% which is normal. She is moderately obese. Head is normocephalic and atraumatic. PERRLA, EOMI. Moderate exophthalmus is noted and there is mild to moderate lid lag. Oropharynx is clear. Neck is supple without adenopathy, JVD, or bruit. Back is nontender. Lungs are clear without rales, wheezes, rhonchi. Heart has regular rate rhythm without murmur. There is no chest wall tenderness. Abdomen is soft, flat, nontender without masses or hepatosplenomegaly. Extremities have no cyanosis or edema, full range of motion is present. Skin is warm and dry without rash. Neurologic: Mental status is normal, cranial nerves are intact, there no focal motor or sensory deficits.  ED Course  Procedures (including critical care time)  Results for orders placed during the hospital encounter of 06/29/11  CBC      Component Value Range   WBC 9.6  4.0 - 10.5 (K/uL)   RBC 3.94  3.87 - 5.11 (MIL/uL)   Hemoglobin 10.9 (*) 12.0 - 15.0 (g/dL)   HCT  33.4 (*) 36.0 - 46.0 (%)   MCV 84.8  78.0 - 100.0 (fL)   MCH 27.7  26.0 - 34.0 (pg)   MCHC 32.6  30.0 - 36.0 (g/dL)   RDW 16.1  09.6 - 04.5 (%)   Platelets 236  150 - 400 (K/uL)  DIFFERENTIAL      Component Value Range   Neutrophils Relative 61  43 - 77 (%)   Neutro Abs 5.8  1.7 - 7.7 (K/uL)   Lymphocytes Relative 30  12 - 46 (%)   Lymphs Abs 2.9  0.7 - 4.0 (K/uL)   Monocytes Relative 6  3 - 12 (%)   Monocytes Absolute 0.5  0.1 - 1.0 (K/uL)   Eosinophils Relative 3  0 - 5 (%)   Eosinophils Absolute 0.3  0.0 - 0.7 (K/uL)   Basophils Relative 0  0 - 1 (%)   Basophils Absolute 0.0  0.0 - 0.1 (K/uL)  COMPREHENSIVE METABOLIC PANEL       Component Value Range   Sodium 139  135 - 145 (mEq/L)   Potassium 3.9  3.5 - 5.1 (mEq/L)   Chloride 104  96 - 112 (mEq/L)   CO2 24  19 - 32 (mEq/L)   Glucose, Bld 100 (*) 70 - 99 (mg/dL)   BUN 15  6 - 23 (mg/dL)   Creatinine, Ser 4.09  0.50 - 1.10 (mg/dL)   Calcium 9.9  8.4 - 81.1 (mg/dL)   Total Protein 7.8  6.0 - 8.3 (g/dL)   Albumin 3.8  3.5 - 5.2 (g/dL)   AST 19  0 - 37 (U/L)   ALT 18  0 - 35 (U/L)   Alkaline Phosphatase 74  39 - 117 (U/L)   Total Bilirubin 0.2 (*) 0.3 - 1.2 (mg/dL)   GFR calc non Af Amer 81 (*) >90 (mL/min)   GFR calc Af Amer >90  >90 (mL/min)  URINALYSIS, ROUTINE W REFLEX MICROSCOPIC      Component Value Range   Color, Urine YELLOW  YELLOW    APPearance CLOUDY (*) CLEAR    Specific Gravity, Urine 1.023  1.005 - 1.030    pH 5.5  5.0 - 8.0    Glucose, UA NEGATIVE  NEGATIVE (mg/dL)   Hgb urine dipstick NEGATIVE  NEGATIVE    Bilirubin Urine NEGATIVE  NEGATIVE    Ketones, ur NEGATIVE  NEGATIVE (mg/dL)   Protein, ur NEGATIVE  NEGATIVE (mg/dL)   Urobilinogen, UA 0.2  0.0 - 1.0 (mg/dL)   Nitrite NEGATIVE  NEGATIVE    Leukocytes, UA SMALL (*) NEGATIVE   URINE MICROSCOPIC-ADD ON      Component Value Range   Squamous Epithelial / LPF MANY (*) RARE    WBC, UA 3-6  <3 (WBC/hpf)   Bacteria, UA MANY (*) RARE      ECG shows normal sinus rhythm with a rate of 75, no ectopy. Normal axis. Normal P wave. Normal QRS. Normal intervals. Normal ST and T waves. Impression: normal ECG. When compared with ECG of 01/24/2006, no significant changes are noted.  Orthostatic vital signs did not show significant rise in pulse or drop in blood pressure, although the heart rate did rise with sitting. Blood pressure has come down with resting in the ED although she still has a slightly elevated systolic blood pressure. Patient was advised of the results of her workup and is instructed to monitor her blood pressure at home. She'll be given resource guide to try and find appropriate primary  care physician.  1. Dizziness   2. Anemia   3. Hypertension       MDM  Dizziness of uncertain etiology. Mild hypertension. Orthostatic vital signs will be checked as well as CBC and chemistry panel. Of note, exophthalmus and lid lag isn't suggestive of Graves' disease with IM and aerated however, she says her eyes have always been prominent and she has had thyroid tests which have been normal. Prior labs were reviewed and she indeed had a normal TSH in 2008.        Dione Booze, MD 06/29/11 [phone]

## 2011-06-29 NOTE — ED Notes (Signed)
Pt states dizziness began Thursday and worse today.  Accompanied by intermittent headache pain today.  Denies weakness and describes dizziness as "light headed".

## 2011-06-29 NOTE — ED Notes (Signed)
Pt reports dizziness with ambulation to restroom to obtain urine sample.

## 2011-06-29 NOTE — ED Notes (Signed)
Pt denies dizziness while performing orthostatics.

## 2011-06-29 NOTE — ED Notes (Signed)
Dr. Glick at pt bedside  

## 2011-07-10 ENCOUNTER — Encounter: Payer: Self-pay | Admitting: Internal Medicine

## 2011-10-10 ENCOUNTER — Emergency Department (HOSPITAL_COMMUNITY): Payer: Self-pay

## 2011-10-10 ENCOUNTER — Encounter (HOSPITAL_COMMUNITY): Payer: Self-pay

## 2011-10-10 ENCOUNTER — Emergency Department (HOSPITAL_COMMUNITY)
Admission: EM | Admit: 2011-10-10 | Discharge: 2011-10-10 | Disposition: A | Discharge status: Home / Self Care | Payer: Self-pay | Attending: Emergency Medicine | Admitting: Emergency Medicine

## 2011-10-10 DIAGNOSIS — I10 Essential (primary) hypertension: Secondary | ICD-10-CM | POA: Insufficient documentation

## 2011-10-10 DIAGNOSIS — R109 Unspecified abdominal pain: Secondary | ICD-10-CM | POA: Insufficient documentation

## 2011-10-10 DIAGNOSIS — K5900 Constipation, unspecified: Secondary | ICD-10-CM | POA: Insufficient documentation

## 2011-10-10 LAB — URINALYSIS, ROUTINE W REFLEX MICROSCOPIC
Glucose, UA: NEGATIVE mg/dL
Hgb urine dipstick: NEGATIVE
Specific Gravity, Urine: 1.014 (ref 1.005–1.030)

## 2011-10-10 LAB — POCT I-STAT, CHEM 8
BUN: 12 mg/dL (ref 6–23)
Creatinine, Ser: 0.8 mg/dL (ref 0.50–1.10)
Hemoglobin: 11.9 g/dL — ABNORMAL LOW (ref 12.0–15.0)
Potassium: 3.5 mEq/L (ref 3.5–5.1)
Sodium: 140 mEq/L (ref 135–145)

## 2011-10-10 MED ORDER — MAGNESIUM CITRATE PO SOLN: 296.0000 mL | Freq: Once | ORAL | Status: AC

## 2011-10-10 MED ORDER — POLYETHYLENE GLYCOL 3350 17 GM/SCOOP PO POWD: 17.0000 g | Freq: Every day | ORAL | Status: AC

## 2011-10-10 NOTE — ED Notes (Signed)
Pt in from home via ems with abd pain states onset yesterday worsening this am states last BM was Monday states pain is lower abd area cramping in sensation denies n/v non radiating

## 2011-10-10 NOTE — ED Notes (Signed)
Pt. returned from XR. 

## 2011-10-10 NOTE — ED Notes (Signed)
Patient transported to X-ray 

## 2011-10-10 NOTE — Discharge Instructions (Signed)
Your xray shows that you appear to be constipated. You have been given prescriptions for mag citrate and Miralax. Get both of these filled and take the mag citrate bottle this evening. Start taking the Miralax every day - you can take it twice a day for the next several days. If you have increased or changing pain, high fever, nausea/vomiting, are still unable to have a bowel movement, or with other worrisome symptoms, please return to the ED.

## 2011-10-10 NOTE — ED Provider Notes (Signed)
History     CSN: [id number]  Arrival date & time 10/10/11  1528   First MD Initiated Contact with Patient 10/10/11 1558      Chief Complaint  Patient presents with  . Abdominal Pain    (Consider location/radiation/quality/duration/timing/severity/associated sxs/prior treatment) HPI History from patient. 56yo F who presents with c/o abdominal pain which began yesterday morning. Pain described as crampy in nature and located to the lower abd. Pain is intermittent with no known aggravating/alleviating factors. Pt was last able to have a bowel movement on Monday, 5 days ago; states she's had trouble with constipation in the past and abd felt similar with that. She tried Exlax but this did not facilitate a BM. Has been able to pass flatus. Denies nausea, vomiting, changes in appetite. She has not had a fever or chills.  Pertinent surg hx: abd hysterectomy.  Past Medical History  Diagnosis Date  . Hypertension     pt states she stopped taking meds while in her 20's    Past Surgical History  Procedure Date  . Abdominal hysterectomy     No family history on file.  History  Substance Use Topics  . Smoking status: Never Smoker   . Smokeless tobacco: Never Used  . Alcohol Use: Yes     occassional    OB History    Grav Para Term Preterm Abortions TAB SAB Ect Mult Living                  Review of Systems as per HPI, 10 pt ROS otherwise neg  Allergies  Review of patient's allergies indicates no known allergies.  Home Medications   Current Outpatient Rx  Name Route Sig Dispense Refill  . WOMENS DAILY FORMULA PO Oral Take 1 tablet by mouth daily.      BP 186/91  Pulse 75  Temp 98.2 F (36.8 C) (Oral)  Resp 13  SpO2 100%  Physical Exam  Nursing note and vitals reviewed. Constitutional: She appears well-developed and well-nourished. No distress.       Patient in NAD, sitting in chair at bedside  HENT:  Head: Normocephalic and atraumatic.  Mouth/Throat:  Oropharynx is clear and moist. No oropharyngeal exudate.  Eyes:       Normal appearance  Neck: Normal range of motion.  Cardiovascular: Normal rate, regular rhythm and normal heart sounds.  Exam reveals no gallop and no friction rub.   No murmur heard. Pulmonary/Chest: Effort normal and breath sounds normal. She exhibits no tenderness.  Abdominal: Soft. Bowel sounds are normal. She exhibits no distension and no mass. There is no tenderness. There is no rebound and no guarding.       Abd is not distended appearing, normal bowel sounds, no focal tenderness to palpation  Musculoskeletal: Normal range of motion. She exhibits no edema.  Neurological: She is alert.  Skin: Skin is warm and dry. She is not diaphoretic.  Psychiatric: She has a normal mood and affect.    ED Course  Procedures (including critical care time)  Labs Reviewed  POCT I-STAT, CHEM 8 - Abnormal; Notable for the following:    Hemoglobin 11.9 (*)     HCT 35.0 (*)     All other components within normal limits  URINALYSIS, ROUTINE W REFLEX MICROSCOPIC   Dg Abd Acute W/chest  10/10/2011  *RADIOLOGY REPORT*  Clinical Data: Abdominal pain.  Constipation.  High blood pressure.  ACUTE ABDOMEN SERIES (ABDOMEN 2 VIEW & CHEST 1 VIEW)  Comparison: 08/17/2004 chest  x-ray.  Findings: Heart size within normal limits. No infiltrate, congestive heart failure or pneumothorax.  Minimal prominence aortic knob.  Mild scoliosis thoracic spine.  Nonspecific bowel gas pattern without plain film evidence of bowel obstruction or free intraperitoneal air.  Mild to moderate amount of stool in the colon.  IMPRESSION: No plain film evidence of bowel obstruction or free intraperitoneal air.  Please see above.  Original Report Authenticated By: Fuller Canada, M.D.     1. Constipation       MDM  Pt with crampy abd pain x 2 days; unable to have bowel movement x 5 days. Has had trouble with constipation in past and states this feels similar. She has  been able to pass gas and has not had n/v. On exam, in no acute distress; no focal tenderness to palpation. Abd series which I reviewed with mild-mod stool in colon, no plain film evidence for obstruction. Feel this most likely represents constipation. Patient given prescriptions for mag citrate, Miralax which she was instructed to use this evening. High fiber diet discussed. Strict return precautions given including: worsening or changing pain, abd distension, development of nausea/vomiting, continued inability to have BM, or with any other concerns about worsening condition. Pt verbalized understanding, agreeable with this plan.       Grant Fontana, PA-C 10/10/11 2316

## 2011-10-10 NOTE — ED Notes (Signed)
Pt requesting information and resources for PCP,HTN and finical ability to pay for meds. Request given to EDPA

## 2011-10-12 NOTE — ED Provider Notes (Signed)
Medical screening examination/treatment/procedure(s) were performed by non-physician practitioner and as supervising physician I was immediately available for consultation/collaboration.  Flint Melter, MD 10/12/11 1525

## 2012-01-04 ENCOUNTER — Emergency Department (HOSPITAL_COMMUNITY)
Admission: EM | Admit: 2012-01-04 | Discharge: 2012-01-04 | Disposition: A | Discharge status: Home / Self Care | Payer: Self-pay | Attending: Emergency Medicine | Admitting: Emergency Medicine

## 2012-01-04 ENCOUNTER — Emergency Department (HOSPITAL_COMMUNITY): Payer: Self-pay

## 2012-01-04 ENCOUNTER — Encounter (HOSPITAL_COMMUNITY): Payer: Self-pay | Admitting: Emergency Medicine

## 2012-01-04 DIAGNOSIS — D649 Anemia, unspecified: Secondary | ICD-10-CM

## 2012-01-04 DIAGNOSIS — M79609 Pain in unspecified limb: Secondary | ICD-10-CM | POA: Insufficient documentation

## 2012-01-04 DIAGNOSIS — R42 Dizziness and giddiness: Secondary | ICD-10-CM | POA: Insufficient documentation

## 2012-01-04 DIAGNOSIS — Z7982 Long term (current) use of aspirin: Secondary | ICD-10-CM | POA: Insufficient documentation

## 2012-01-04 DIAGNOSIS — R0602 Shortness of breath: Secondary | ICD-10-CM | POA: Insufficient documentation

## 2012-01-04 DIAGNOSIS — I10 Essential (primary) hypertension: Secondary | ICD-10-CM | POA: Insufficient documentation

## 2012-01-04 DIAGNOSIS — M542 Cervicalgia: Secondary | ICD-10-CM | POA: Insufficient documentation

## 2012-01-04 DIAGNOSIS — R1013 Epigastric pain: Secondary | ICD-10-CM | POA: Insufficient documentation

## 2012-01-04 DIAGNOSIS — R197 Diarrhea, unspecified: Secondary | ICD-10-CM | POA: Insufficient documentation

## 2012-01-04 DIAGNOSIS — R112 Nausea with vomiting, unspecified: Secondary | ICD-10-CM | POA: Insufficient documentation

## 2012-01-04 LAB — CBC WITH DIFFERENTIAL/PLATELET
Eosinophils Absolute: 0 10*3/uL (ref 0.0–0.7)
Hemoglobin: 9.1 g/dL — ABNORMAL LOW (ref 12.0–15.0)
Lymphs Abs: 1.2 10*3/uL (ref 0.7–4.0)
MCH: 25.6 pg — ABNORMAL LOW (ref 26.0–34.0)
MCV: 79.8 fL (ref 78.0–100.0)
Monocytes Relative: 5 % (ref 3–12)
Neutrophils Relative %: 83 % — ABNORMAL HIGH (ref 43–77)
RBC: 3.56 MIL/uL — ABNORMAL LOW (ref 3.87–5.11)

## 2012-01-04 LAB — COMPREHENSIVE METABOLIC PANEL
Alkaline Phosphatase: 79 U/L (ref 39–117)
BUN: 14 mg/dL (ref 6–23)
CO2: 22 mEq/L (ref 19–32)
GFR calc Af Amer: 44 mL/min — ABNORMAL LOW (ref >90)
GFR calc non Af Amer: 38 mL/min — ABNORMAL LOW (ref >90)
Glucose, Bld: 100 mg/dL — ABNORMAL HIGH (ref 70–99)
Potassium: 3.7 mEq/L (ref 3.5–5.1)
Total Bilirubin: 0.6 mg/dL (ref 0.3–1.2)
Total Protein: 8.1 g/dL (ref 6.0–8.3)

## 2012-01-04 LAB — POCT I-STAT TROPONIN I
Troponin i, poc: 0.01 ng/mL (ref 0.00–0.08)
Troponin i, poc: 0 ng/mL (ref 0.00–0.08)

## 2012-01-04 MED ORDER — PANTOPRAZOLE SODIUM 40 MG IV SOLR
40.0000 mg | Freq: Once | INTRAVENOUS | Status: AC
  Administered 2012-01-04: 40 mg via INTRAVENOUS
  Filled 2012-01-04: qty 40

## 2012-01-04 MED ORDER — ONDANSETRON HCL 4 MG/2ML IJ SOLN
INTRAMUSCULAR | Status: AC
  Filled 2012-01-04: qty 2

## 2012-01-04 MED ORDER — ONDANSETRON HCL 4 MG/2ML IJ SOLN
4.0000 mg | Freq: Once | INTRAMUSCULAR | Status: AC
  Administered 2012-01-04: 4 mg via INTRAVENOUS
  Filled 2012-01-04: qty 2

## 2012-01-04 MED ORDER — FAMOTIDINE 20 MG PO TABS: 20.0000 mg | ORAL_TABLET | Freq: Two times a day (BID) | ORAL | Status: DC

## 2012-01-04 MED ORDER — SODIUM CHLORIDE 0.9 % IV BOLUS (SEPSIS)
1000.0000 mL | Freq: Once | INTRAVENOUS | Status: AC
  Administered 2012-01-04: 1000 mL via INTRAVENOUS

## 2012-01-04 MED ORDER — SODIUM CHLORIDE 0.9 % IV SOLN: INTRAVENOUS | Status: DC

## 2012-01-04 MED ORDER — TRAMADOL HCL 50 MG PO TABS: 50.0000 mg | ORAL_TABLET | Freq: Four times a day (QID) | ORAL | Status: AC | PRN

## 2012-01-04 MED ORDER — IOHEXOL 300 MG/ML  SOLN
80.0000 mL | Freq: Once | INTRAMUSCULAR | Status: AC | PRN
  Administered 2012-01-04: 80 mL via INTRAVENOUS

## 2012-01-04 NOTE — ED Provider Notes (Signed)
Assumed patient care in the CDU while waiting for abdominal CT scan for epigastric pain. CT results reviewed by me shows edema of the distal and terminal ileum suspicious for inflammatory bowel disease. There is also a vague low attenuation in the right lower pelvis which could indicate an abscess. Patient is currently feeling better and abdomen is nontender on exam. I discussed with my attending who recommends discharge patient with pain medication only. Patient will be recommended to follow up with GI for further evaluation.  10:34 PM Pt request to be discharge so she can catch the bus.  I will give pt referral to GI for f/u.  Recommend f/[facility] with [facility] as well to reestablished care.  Pt will be d/c with pain medication.  Recommend strict return precaution if sxs worsen.  Pt voice understanding and agrees with plan.    Nursing notes reviewed and considered in documentation  Previous records reviewed and considered  All labs/vitals reviewed and considered  xrays reviewed and considered  BP 101/75  Pulse 70  Temp 98.7 F (37.1 C) (Oral)  Resp 16  Ht 5\' 11"  (1.803 m)  Wt 200 lb (90.719 kg)  BMI 27.89 kg/m2  SpO2 99%   Results for orders placed during the hospital encounter of 01/04/12  CBC WITH DIFFERENTIAL      Component Value Range   WBC 11.0 (*) 4.0 - 10.5 K/uL   RBC 3.56 (*) 3.87 - 5.11 MIL/uL   Hemoglobin 9.1 (*) 12.0 - 15.0 g/dL   HCT 16.1 (*) 09.6 - 04.5 %   MCV 79.8  78.0 - 100.0 fL   MCH 25.6 (*) 26.0 - 34.0 pg   MCHC 32.0  30.0 - 36.0 g/dL   RDW 40.9 (*) 81.1 - 91.4 %   Platelets 241  150 - 400 K/uL   Neutrophils Relative 83 (*) 43 - 77 %   Neutro Abs 9.1 (*) 1.7 - 7.7 K/uL   Lymphocytes Relative 11 (*) 12 - 46 %   Lymphs Abs 1.2  0.7 - 4.0 K/uL   Monocytes Relative 5  3 - 12 %   Monocytes Absolute 0.6  0.1 - 1.0 K/uL   Eosinophils Relative 0  0 - 5 %   Eosinophils Absolute 0.0  0.0 - 0.7 K/uL   Basophils Relative 0  0 - 1 %   Basophils Absolute 0.0  0.0 - 0.1 K/uL    COMPREHENSIVE METABOLIC PANEL      Component Value Range   Sodium 137  135 - 145 mEq/L   Potassium 3.7  3.5 - 5.1 mEq/L   Chloride 101  96 - 112 mEq/L   CO2 22  19 - 32 mEq/L   Glucose, Bld 100 (*) 70 - 99 mg/dL   BUN 14  6 - 23 mg/dL   Creatinine, Ser 7.82 (*) 0.50 - 1.10 mg/dL   Calcium 9.8  8.4 - 95.6 mg/dL   Total Protein 8.1  6.0 - 8.3 g/dL   Albumin 3.8  3.5 - 5.2 g/dL   AST 25  0 - 37 U/L   ALT 15  0 - 35 U/L   Alkaline Phosphatase 79  39 - 117 U/L   Total Bilirubin 0.6  0.3 - 1.2 mg/dL   GFR calc non Af Amer 38 (*) >90 mL/min   GFR calc Af Amer 44 (*) >90 mL/min  POCT I-STAT TROPONIN I      Component Value Range   Troponin i, poc 0.01  0.00 - 0.08  ng/mL   Comment 3           LIPASE, BLOOD      Component Value Range   Lipase 40  11 - 59 U/L  POCT I-STAT TROPONIN I      Component Value Range   Troponin i, poc 0.00  0.00 - 0.08 ng/mL   Comment 3            Dg Chest 2 View  01/04/2012  *RADIOLOGY REPORT*  Clinical Data: Dizziness.  Left arm pain.  CHEST - 2 VIEW  Comparison: 08/17/2004  Findings: Heart size is normal.  The aorta is slightly unfolded. Lungs are clear.  The pulmonary vascularity is normal.  No effusions.  Ordinary degenerative changes effect the spine.  IMPRESSION: No active disease.   Original Report Authenticated By: Thomasenia Sales, M.D.    Ct Abdomen Pelvis W Contrast  01/04/2012  *RADIOLOGY REPORT*  Clinical Data: Epigastric abdominal pain, vomiting, diarrhea  CT ABDOMEN AND PELVIS WITH CONTRAST  Technique:  Multidetector CT imaging of the abdomen and pelvis was performed following the standard protocol during bolus administration of intravenous contrast.  Contrast: 80mL OMNIPAQUE IOHEXOL 300 MG/ML  SOLN  Comparison: Abdomen films of 10/10/2011  Findings: The lung bases are clear. The liver enhances and there may be mild fatty infiltration present.  There is an area of increased enhancement medially in the right lobe of liver caudally which could represent  hemangioma.  No calcified gallstones are seen.  The pancreas is normal in size and the pancreatic duct is not dilated.  The adrenal glands and spleen are unremarkable.  The stomach is not well distended.  The kidneys enhance with no calculus or mass and no hydronephrosis is seen.  The abdominal aorta is normal in caliber.  The mesenteric vasculature appears patent.  No adenopathy is seen.  The colon is unremarkable.  The urinary bladder is unremarkable. The uterus has been removed previously.  No adnexal lesion is seen. No fluid is noted within the pelvis.  However, although the distal and terminal ileum are not well distended with oral contrast, there does appear to be diffuse mucosal edema of the distal and terminal ileum.  There is a low attenuation structure within the right pelvis adjacent to the edematous distal ileum which may represent a small abscess.  These findings are suspicious for inflammatory bowel disease such as Crohn's disease with infectious or ischemic etiology less likely.  Only a tiny amount of fluid is noted within the pelvis.  Degenerative change is noted in the facet joints of the lower lumbar spine and also within the SI joints.  IMPRESSION:  1.  Edema of the distal and terminal ileum, suspicious for inflammatory bowel disease.  Infectious or ischemic etiology are also considerations. 2.  Vague low attenuation area in the right lower pelvis adjacent to the edematous small bowel could indicate abscess.   Original Report Authenticated By: Juline Patch, M.D.       Fayrene Helper, PA-C 01/04/12 2241

## 2012-01-04 NOTE — ED Notes (Signed)
Patient transported to CT 

## 2012-01-04 NOTE — ED Notes (Signed)
Dizziness sob and  Nausea since 7am went to work but got sicker also having neck pain rads down left arm

## 2012-01-04 NOTE — ED Notes (Signed)
MD at bedside. 

## 2012-01-04 NOTE — ED Notes (Signed)
CT informed pt has finished her oral contrast

## 2012-01-04 NOTE — ED Notes (Signed)
Pt continues to have dizziness off and on.  No other complaints at this time.  Pt alert and oriented x's 3, skin warm and dry, color appropriate.

## 2012-01-04 NOTE — ED Provider Notes (Signed)
History     CSN: [id number]  Arrival date & time 01/04/12  1307   First MD Initiated Contact with Patient 01/04/12 1508      Chief Complaint  Patient presents with  . Dizziness    (Consider location/radiation/quality/duration/timing/severity/associated sxs/prior treatment) The history is provided by the patient and the EMS personnel.   patient is a 56 year old female brought in by EMS. For onset this morning at 10:30 of diaphoresis shortness of breath some neck pain some left arm pain and dizziness no chest pain. Patient has been sick since Saturday which would be 2-3 days ago having epigastric abdominal pain with vomiting and some diarrhea. Yesterday patient has 6-7 loose bowel movements no blood in either one. Patient's past medical history supposedly from hypertension which currently not taking any medications her blood pressures can hear. Epigastric abdominal pain is described as currently 6/10 at its worse several days ago it was a 10 out of 10. Pain is nonradiating epigastric pain is described as sharp at times and 8 other times.  Past Medical History  Diagnosis Date  . Hypertension     pt states she stopped taking meds while in her 20's    Past Surgical History  Procedure Date  . Abdominal hysterectomy     No family history on file.  History  Substance Use Topics  . Smoking status: Never Smoker   . Smokeless tobacco: Never Used  . Alcohol Use: Yes     occassional    OB History    Grav Para Term Preterm Abortions TAB SAB Ect Mult Living                  Review of Systems  Constitutional: Positive for diaphoresis and fatigue. Negative for fever.  HENT: Positive for neck pain. Negative for congestion.   Eyes: Negative for visual disturbance.  Cardiovascular: Negative for palpitations and leg swelling.  Gastrointestinal: Positive for nausea, vomiting, abdominal pain and diarrhea. Negative for blood in stool.  Genitourinary: Negative for dysuria.  Musculoskeletal:  Negative for back pain.  Skin: Negative for rash.  Neurological: Positive for dizziness. Negative for headaches.  Hematological: Does not bruise/bleed easily.    Allergies  Review of patient's allergies indicates no known allergies.  Home Medications   Current Outpatient Rx  Name Route Sig Dispense Refill  . ASPIRIN EC 325 MG PO TBEC Oral Take 325-650 mg by mouth every 6 (six) hours as needed. For pain    . NAPROXEN SODIUM 220 MG PO TABS Oral Take 220 mg by mouth 2 (two) times daily as needed. For pain      BP 113/66  Pulse 75  Temp 98.7 F (37.1 C) (Oral)  Resp 15  Ht 5\' 11"  (1.803 m)  Wt 200 lb (90.719 kg)  BMI 27.89 kg/m2  SpO2 98%  Physical Exam  Nursing note and vitals reviewed. Constitutional: She is oriented to person, place, and time. She appears well-developed and well-nourished.  HENT:  Head: Normocephalic and atraumatic.  Mouth/Throat: Oropharynx is clear and moist.  Eyes: Conjunctivae and EOM are normal. Pupils are equal, round, and reactive to light.  Neck: Normal range of motion. Neck supple.  Cardiovascular: Normal rate and regular rhythm.   No murmur heard. Pulmonary/Chest: Effort normal and breath sounds normal. No respiratory distress. She has no wheezes. She has no rales. She exhibits no tenderness.  Abdominal: Soft. Bowel sounds are normal. There is no tenderness.  Musculoskeletal: Normal range of motion. She exhibits no edema and no  tenderness.  Neurological: She is alert and oriented to person, place, and time. No cranial nerve deficit. She exhibits normal muscle tone. Coordination normal.  Skin: Skin is warm. No rash noted. She is not diaphoretic.    ED Course  Procedures (including critical care time)  Labs Reviewed  CBC WITH DIFFERENTIAL - Abnormal; Notable for the following:    WBC 11.0 (*)     RBC 3.56 (*)     Hemoglobin 9.1 (*)     HCT 28.4 (*)     MCH 25.6 (*)     RDW 15.7 (*)     Neutrophils Relative 83 (*)     Neutro Abs 9.1 (*)      Lymphocytes Relative 11 (*)     All other components within normal limits  COMPREHENSIVE METABOLIC PANEL - Abnormal; Notable for the following:    Glucose, Bld 100 (*)     Creatinine, Ser 1.50 (*)     GFR calc non Af Amer 38 (*)     GFR calc Af Amer 44 (*)     All other components within normal limits  POCT I-STAT TROPONIN I  LIPASE, BLOOD  POCT I-STAT TROPONIN I   Dg Chest 2 View  01/04/2012  *RADIOLOGY REPORT*  Clinical Data: Dizziness.  Left arm pain.  CHEST - 2 VIEW  Comparison: 08/17/2004  Findings: Heart size is normal.  The aorta is slightly unfolded. Lungs are clear.  The pulmonary vascularity is normal.  No effusions.  Ordinary degenerative changes effect the spine.  IMPRESSION: No active disease.   Original Report Authenticated By: Thomasenia Sales, M.D.    Date: 01/04/2012  Rate: 66  Rhythm: normal sinus rhythm  QRS Axis: normal  Intervals: normal  ST/T Wave abnormalities: normal  Conduction Disutrbances:none  Narrative Interpretation:   Old EKG Reviewed: none available  Results for orders placed during the hospital encounter of 01/04/12  CBC WITH DIFFERENTIAL      Component Value Range   WBC 11.0 (*) 4.0 - 10.5 K/uL   RBC 3.56 (*) 3.87 - 5.11 MIL/uL   Hemoglobin 9.1 (*) 12.0 - 15.0 g/dL   HCT 40.9 (*) 81.1 - 91.4 %   MCV 79.8  78.0 - 100.0 fL   MCH 25.6 (*) 26.0 - 34.0 pg   MCHC 32.0  30.0 - 36.0 g/dL   RDW 78.2 (*) 95.6 - 21.3 %   Platelets 241  150 - 400 K/uL   Neutrophils Relative 83 (*) 43 - 77 %   Neutro Abs 9.1 (*) 1.7 - 7.7 K/uL   Lymphocytes Relative 11 (*) 12 - 46 %   Lymphs Abs 1.2  0.7 - 4.0 K/uL   Monocytes Relative 5  3 - 12 %   Monocytes Absolute 0.6  0.1 - 1.0 K/uL   Eosinophils Relative 0  0 - 5 %   Eosinophils Absolute 0.0  0.0 - 0.7 K/uL   Basophils Relative 0  0 - 1 %   Basophils Absolute 0.0  0.0 - 0.1 K/uL  COMPREHENSIVE METABOLIC PANEL      Component Value Range   Sodium 137  135 - 145 mEq/L   Potassium 3.7  3.5 - 5.1 mEq/L    Chloride 101  96 - 112 mEq/L   CO2 22  19 - 32 mEq/L   Glucose, Bld 100 (*) 70 - 99 mg/dL   BUN 14  6 - 23 mg/dL   Creatinine, Ser 0.86 (*) 0.50 - 1.10 mg/dL  Calcium 9.8  8.4 - 10.5 mg/dL   Total Protein 8.1  6.0 - 8.3 g/dL   Albumin 3.8  3.5 - 5.2 g/dL   AST 25  0 - 37 U/L   ALT 15  0 - 35 U/L   Alkaline Phosphatase 79  39 - 117 U/L   Total Bilirubin 0.6  0.3 - 1.2 mg/dL   GFR calc non Af Amer 38 (*) >90 mL/min   GFR calc Af Amer 44 (*) >90 mL/min  POCT I-STAT TROPONIN I      Component Value Range   Troponin i, poc 0.01  0.00 - 0.08 ng/mL   Comment 3           LIPASE, BLOOD      Component Value Range   Lipase 40  11 - 59 U/L  POCT I-STAT TROPONIN I      Component Value Range   Troponin i, poc 0.00  0.00 - 0.08 ng/mL   Comment 3               1. Epigastric abdominal pain   2. Dizziness       MDM  Patient with 2 day history of feeling sick with abdominal pain mostly epigastric area and with vomiting. Today the abdominal pain persisted but the vomiting had stopped nausea persisted. She gets short of breath and diaphoresis she had some neck pain left arm and dizziness. No chest pain does have some mild epigastric pain. She has had some diarrhea 6-7 loose bowel movements a yesterday no vomiting today but nausea persists. No fever. Onset of the other symptoms the left arm pain and neck pain dizziness shortness of breath and diaphoresis that occurred just today at 10:30 this morning.  Have ruled out an acute cardiac event with 2 troponins. EKG without acute findings. Chest x-rays negative for pneumonia pneumothorax. For the epigastric abdominal pain we'll proceed with a CT of abdomen and pelvis with contrast and will move patient to the CDU if that's negative patient can be discharged home. Based on liver function tests is no significant abnormalities lipase is not elevated. Mild elevation in the creatinine of 1.5 which could be persistent with the diarrhea and some of the vomiting  over the weekend. Mild leukocytosis and hemoglobin hematocrit is noted for anemia transfusion not required that this is a change compared to her recent labs. Patient may be having some GI blood loss do to a stomach ulcer. patient was given protonic since the emergency department along with the Zofran and this did help the pain.        Shelda Jakes, MD 01/04/12 [phone]

## 2012-01-04 NOTE — ED Notes (Signed)
Per ems has IV rt hand 20

## 2012-01-07 ENCOUNTER — Encounter (HOSPITAL_COMMUNITY): Payer: Self-pay | Admitting: *Deleted

## 2012-01-07 ENCOUNTER — Emergency Department (HOSPITAL_COMMUNITY): Payer: Self-pay

## 2012-01-07 ENCOUNTER — Emergency Department (HOSPITAL_COMMUNITY)
Admission: EM | Admit: 2012-01-07 | Discharge: 2012-01-07 | Disposition: A | Discharge status: Home / Self Care | Payer: Self-pay | Attending: Emergency Medicine | Admitting: Emergency Medicine

## 2012-01-07 DIAGNOSIS — I10 Essential (primary) hypertension: Secondary | ICD-10-CM | POA: Insufficient documentation

## 2012-01-07 DIAGNOSIS — K5720 Diverticulitis of large intestine with perforation and abscess without bleeding: Secondary | ICD-10-CM

## 2012-01-07 DIAGNOSIS — Z7982 Long term (current) use of aspirin: Secondary | ICD-10-CM | POA: Insufficient documentation

## 2012-01-07 DIAGNOSIS — K5732 Diverticulitis of large intestine without perforation or abscess without bleeding: Secondary | ICD-10-CM | POA: Insufficient documentation

## 2012-01-07 DIAGNOSIS — R109 Unspecified abdominal pain: Secondary | ICD-10-CM

## 2012-01-07 LAB — CBC WITH DIFFERENTIAL/PLATELET
Basophils Relative: 0 % (ref 0–1)
Eosinophils Absolute: 0.1 10*3/uL (ref 0.0–0.7)
MCH: 25.1 pg — ABNORMAL LOW (ref 26.0–34.0)
MCHC: 32 g/dL (ref 30.0–36.0)
Monocytes Relative: 4 % (ref 3–12)
Neutrophils Relative %: 84 % — ABNORMAL HIGH (ref 43–77)
Platelets: 275 10*3/uL (ref 150–400)

## 2012-01-07 LAB — COMPREHENSIVE METABOLIC PANEL
Albumin: 3.5 g/dL (ref 3.5–5.2)
Alkaline Phosphatase: 77 U/L (ref 39–117)
BUN: 14 mg/dL (ref 6–23)
Calcium: 9.8 mg/dL (ref 8.4–10.5)
Potassium: 3.4 mEq/L — ABNORMAL LOW (ref 3.5–5.1)
Sodium: 138 mEq/L (ref 135–145)
Total Protein: 8 g/dL (ref 6.0–8.3)

## 2012-01-07 LAB — LIPASE, BLOOD: Lipase: 28 U/L (ref 11–59)

## 2012-01-07 MED ORDER — CIPROFLOXACIN HCL 500 MG PO TABS: 500.0000 mg | ORAL_TABLET | Freq: Two times a day (BID) | ORAL | Status: AC

## 2012-01-07 MED ORDER — IOHEXOL 300 MG/ML  SOLN
100.0000 mL | Freq: Once | INTRAMUSCULAR | Status: AC | PRN
  Administered 2012-01-07: 100 mL via INTRAVENOUS

## 2012-01-07 MED ORDER — SODIUM CHLORIDE 0.9 % IV BOLUS (SEPSIS)
1000.0000 mL | Freq: Once | INTRAVENOUS | Status: AC
  Administered 2012-01-07: 1000 mL via INTRAVENOUS

## 2012-01-07 MED ORDER — METRONIDAZOLE 500 MG PO TABS: 500.0000 mg | ORAL_TABLET | Freq: Two times a day (BID) | ORAL | Status: AC

## 2012-01-07 MED ORDER — METRONIDAZOLE 500 MG PO TABS
500.0000 mg | ORAL_TABLET | Freq: Once | ORAL | Status: AC
  Administered 2012-01-07: 500 mg via ORAL
  Filled 2012-01-07: qty 1

## 2012-01-07 MED ORDER — ONDANSETRON HCL 4 MG/2ML IJ SOLN
4.0000 mg | INTRAMUSCULAR | Status: AC
  Administered 2012-01-07: 4 mg via INTRAVENOUS
  Filled 2012-01-07: qty 2

## 2012-01-07 MED ORDER — IOHEXOL 300 MG/ML  SOLN: 20.0000 mL | INTRAMUSCULAR | Status: AC

## 2012-01-07 MED ORDER — HYDROCODONE-ACETAMINOPHEN 5-325 MG PO TABS: 1.0000 | ORAL_TABLET | ORAL | Status: AC | PRN

## 2012-01-07 MED ORDER — HYDROMORPHONE HCL PF 1 MG/ML IJ SOLN
1.0000 mg | Freq: Once | INTRAMUSCULAR | Status: AC
  Administered 2012-01-07: 1 mg via INTRAVENOUS
  Filled 2012-01-07: qty 1

## 2012-01-07 MED ORDER — CIPROFLOXACIN HCL 500 MG PO TABS
500.0000 mg | ORAL_TABLET | Freq: Once | ORAL | Status: AC
  Administered 2012-01-07: 500 mg via ORAL
  Filled 2012-01-07: qty 1

## 2012-01-07 NOTE — ED Provider Notes (Signed)
History     CSN: [id number]  Arrival date & time 01/07/12  4098   First MD Initiated Contact with Patient 01/07/12 0754      Chief Complaint  Patient presents with  . Abdominal Pain    (Consider location/radiation/quality/duration/timing/severity/associated sxs/prior treatment) HPI Comments: Patient presents with epigastric abdominal pain that started gradually about 1 week ago. She was seen 3 days ago in the ED for the same epigastric pain. She describes the pain as intermittent, sharp and cramping. Her pain has been continuous and progressively worse since her visit 3 days ago. She did not get her medications filled from her visit 3 days ago and has not taken anything for pain. She was unable to follow up with GI as instructed. She denies any alleviating/aggravating factors. She reports associated nausea but no vomiting. She denies chest pain, SOB, headache, fever, diarrhea.   Patient is a 56 y.o. female presenting with abdominal pain.  Abdominal Pain The primary symptoms of the illness include abdominal pain and nausea.    Past Medical History  Diagnosis Date  . Hypertension     pt states she stopped taking meds while in her 20's    Past Surgical History  Procedure Date  . Abdominal hysterectomy     History reviewed. No pertinent family history.  History  Substance Use Topics  . Smoking status: Never Smoker   . Smokeless tobacco: Never Used  . Alcohol Use: Yes     occassional    OB History    Grav Para Term Preterm Abortions TAB SAB Ect Mult Living                  Review of Systems  Gastrointestinal: Positive for nausea and abdominal pain.  All other systems reviewed and are negative.    Allergies  Review of patient's allergies indicates no known allergies.  Home Medications   Current Outpatient Rx  Name Route Sig Dispense Refill  . ASPIRIN EC 325 MG PO TBEC Oral Take 325-650 mg by mouth every 6 (six) hours as needed. For pain    . FAMOTIDINE 20 MG  PO TABS Oral Take 1 tablet (20 mg total) by mouth 2 (two) times daily. 30 tablet 0  . TRAMADOL HCL 50 MG PO TABS Oral Take 1 tablet (50 mg total) by mouth every 6 (six) hours as needed for pain. 15 tablet 0    BP 131/86  Pulse 80  Temp 98.7 F (37.1 C) (Oral)  Resp 18  SpO2 100%  Physical Exam  Nursing note and vitals reviewed. Constitutional: She is oriented to person, place, and time. She appears well-developed and well-nourished.       Patient uncomfortable and in pain.   HENT:  Head: Normocephalic and atraumatic.  Mouth/Throat: No oropharyngeal exudate.  Eyes: Conjunctivae normal are normal. No scleral icterus.  Neck: Normal range of motion. Neck supple.  Cardiovascular: Normal rate and regular rhythm.  Exam reveals no gallop and no friction rub.   No murmur heard. Pulmonary/Chest: Effort normal and breath sounds normal. No respiratory distress. She has no wheezes. She has no rales. She exhibits no tenderness.  Abdominal: Soft. She exhibits no distension. There is tenderness. There is no guarding.       Generalized tenderness to palpation of abdomen, most notable in suprapubic and epigastric areas.   Musculoskeletal: Normal range of motion.  Neurological: She is alert and oriented to person, place, and time. Coordination normal.  Skin: Skin is warm and dry. No  rash noted. She is not diaphoretic.  Psychiatric: She has a normal mood and affect. Her behavior is normal.    ED Course  Procedures (including critical care time)  Labs Reviewed  CBC WITH DIFFERENTIAL - Abnormal; Notable for the following:    WBC 12.1 (*)     RBC 3.74 (*)     Hemoglobin 9.4 (*)     HCT 29.4 (*)     MCH 25.1 (*)     Neutrophils Relative 84 (*)     Neutro Abs 10.2 (*)     Lymphocytes Relative 11 (*)     All other components within normal limits  COMPREHENSIVE METABOLIC PANEL - Abnormal; Notable for the following:    Potassium 3.4 (*)     Glucose, Bld 103 (*)     GFR calc non Af Amer 78 (*)       All other components within normal limits  LIPASE, BLOOD   No results found.   No diagnosis found.    MDM  11:06 AM Patient have persistent abdominal pain that she was seen 3 days ago for. Previous imaging from 3 days ago suspicious for right pelvic abscess. We will repeat CT of abdomen and pelvis and reassess after results.   1:40 PM CT has resulted revealing a small abscess and partial small bowel obstruction. General Surgery will see the patient.   General surgery saw the patient and concluded that she can be sent home. Patient will be discharged with Flagyl and Cipro for diverticulitis. She should return to the ED with worsening or concerning symptoms.     Emilia Beck, PA-C 01/07/12 2321

## 2012-01-07 NOTE — Consult Note (Signed)
Reason for Consult: Abdominal pain abnormal CT scan Referring Physician: Dr. Ignacia Palma (ER) [name] is an 56 y.o. female.  HPI: Patient is a 56 year old female who presents with  abdominal pain which woke her up around 3 AM this morning. He said he couldn't pass gas she had some nausea, no vomiting she denies fever no diarrhea no constipation she did have a small bowel movement today. She has bowel movements every day her last large normal movement was on Saturday. The discomfort is in mid abdomen and currently is resolved after pain medication. She was seen on 9/9 with complaints of chest and abdominal pain. MI was ruled out. Her CT scan that time showed edema in the distal and terminal ileum suspicious for inflammatory bowel. Infectious or ischemic etiology were considered. There was  Concern that she might have an abscess in the small bowel. She requested discharge in order to catch the bus and was referred to GI for followup.  She did well on 9/10 and 9/11 had no symptoms until this morning. Workup in the ER he has a white count of 12,100 and potassium of 3.4 and otherwise normal lab work. CT scan showed no significant change in the distal small bowel wall thickening and surrounding inflammatory changes. There is a 2 cm fluid collection in the central pelvic small bowel mesentery with a small spot of air in here which was suspicious for a small abscess. There is increased proximal small bowel dilatation consistent with partial small bowel obstruction. Patient was seen and examined in the ER, we offered admission, bowel rest and antibiotics. Patient prefers not to be admitted at this time, and not interested in not having surgery. She has no history of Crohn's disease or any other abdominal discomfort prior to 01/04/12.  Dr. Lindie Spruce has recommended that she go home on 10 days of antibiotics, Flagyl and Cipro. And that she return if she has any further symptoms. He did warn the patient that if she had further  problems that might require an acute surgery. Past Medical History  Diagnosis Date  . Hypertension     pt states she stopped taking meds while in her 20's    Past Surgical History  Procedure Date  . Abdominal hysterectomy     History reviewed. No pertinent family history.  Social History:  reports that she has never smoked. She has never used smokeless tobacco. She reports that she drinks alcohol. She reports that she does not use illicit drugs.  Allergies: No Known Allergies  Medications:  Prior to Admission medications   Medication Sig Start Date End Date Taking? Authorizing Provider  aspirin EC 325 MG tablet Take 325-650 mg by mouth every 6 (six) hours as needed. For pain   Yes Historical Provider, MD  famotidine (PEPCID) 20 MG tablet Take 1 tablet (20 mg total) by mouth 2 (two) times daily. 01/04/12 01/03/13 Yes Fayrene Helper, PA-C  traMADol (ULTRAM) 50 MG tablet Take 1 tablet (50 mg total) by mouth every 6 (six) hours as needed for pain. 01/04/12 01/14/12 Yes Fayrene Helper, PA-C    Prior to Admission:  (Not in a hospital admission) Scheduled:   .  HYDROmorphone (DILAUDID) injection  1 mg Intravenous Once  .  HYDROmorphone (DILAUDID) injection  1 mg Intravenous Once  . iohexol  20 mL Oral Q1 Hr x 2  . ondansetron (ZOFRAN) IV  4 mg Intravenous STAT  . ondansetron (ZOFRAN) IV  4 mg Intravenous STAT  . sodium chloride  1,000  mL Intravenous Once  . sodium chloride  1,000 mL Intravenous Once   Continuous:  AOZ:HYQMVHQ Anti-infectives    None      Results for orders placed during the hospital encounter of 01/07/12 (from the past 48 hour(s))  CBC WITH DIFFERENTIAL     Status: Abnormal   Collection Time   01/07/12  8:42 AM      Component Value Range Comment   WBC 12.1 (*) 4.0 - 10.5 K/uL    RBC 3.74 (*) 3.87 - 5.11 MIL/uL    Hemoglobin 9.4 (*) 12.0 - 15.0 g/dL    HCT 46.9 (*) 62.9 - 46.0 %    MCV 78.6  78.0 - 100.0 fL    MCH 25.1 (*) 26.0 - 34.0 pg    MCHC 32.0  30.0 - 36.0 g/dL     RDW 52.8  41.3 - 24.4 %    Platelets 275  150 - 400 K/uL    Neutrophils Relative 84 (*) 43 - 77 %    Neutro Abs 10.2 (*) 1.7 - 7.7 K/uL    Lymphocytes Relative 11 (*) 12 - 46 %    Lymphs Abs 1.3  0.7 - 4.0 K/uL    Monocytes Relative 4  3 - 12 %    Monocytes Absolute 0.5  0.1 - 1.0 K/uL    Eosinophils Relative 1  0 - 5 %    Eosinophils Absolute 0.1  0.0 - 0.7 K/uL    Basophils Relative 0  0 - 1 %    Basophils Absolute 0.0  0.0 - 0.1 K/uL   COMPREHENSIVE METABOLIC PANEL     Status: Abnormal   Collection Time   01/07/12  8:42 AM      Component Value Range Comment   Sodium 138  135 - 145 mEq/L    Potassium 3.4 (*) 3.5 - 5.1 mEq/L    Chloride 103  96 - 112 mEq/L    CO2 22  19 - 32 mEq/L    Glucose, Bld 103 (*) 70 - 99 mg/dL    BUN 14  6 - 23 mg/dL    Creatinine, Ser 0.10  0.50 - 1.10 mg/dL    Calcium 9.8  8.4 - 27.2 mg/dL    Total Protein 8.0  6.0 - 8.3 g/dL    Albumin 3.5  3.5 - 5.2 g/dL    AST 20  0 - 37 U/L    ALT 15  0 - 35 U/L    Alkaline Phosphatase 77  39 - 117 U/L    Total Bilirubin 0.4  0.3 - 1.2 mg/dL    GFR calc non Af Amer 78 (*) >90 mL/min    GFR calc Af Amer >90  >90 mL/min   LIPASE, BLOOD     Status: Normal   Collection Time   01/07/12  8:42 AM      Component Value Range Comment   Lipase 28  11 - 59 U/L     Ct Abdomen Pelvis W Contrast  01/07/2012  *RADIOLOGY REPORT*  Clinical Data: Worsening diffuse abdominal pain. Nausea.  CT ABDOMEN AND PELVIS WITH CONTRAST  Technique:  Multidetector CT imaging of the abdomen and pelvis was performed following the standard protocol during bolus administration of intravenous contrast.  Contrast: OMNIPAQUE IOHEXOL 300 MG/ML  SOLN  Comparison: 01/04/2012  Findings: Moderate wall thickening of distal ileal small bowel loops is seen in the pelvis, which appears to involve the terminal ileum.  Surrounding mesenteric inflammatory changes are again  seen without significant change.  A 2 cm fluid collection containing air bubble is  seen within the central pelvic small bowel mesentery adjacent to the thickened bowel loops this is suspicious for small abscess.  There is mild increase in mild small small dilatation proximally.  A small amount of free fluid is noted in the abdomen and pelvis, but no abscess is identified.  Mild hepatic steatosis again noted.  No liver masses are identified.  The gallbladder, spleen, pancreas, adrenal glands, and kidneys are normal in appearance.  No evidence of hydronephrosis. Retroaortic left renal vein incidentally noted.  No soft tissue masses or lymphadenopathy identified.  Prior hysterectomy noted.  IMPRESSION:  1.  No significant change in moderate distal small bowel wall thickening and and surrounding inflammatory changes.  Differential diagnosis includes Crohn's disease and in the fraction is enteritis, with bowel ischemia considered less likely. 2.  2 cm fluid collection in the central pelvic small bowel mesentery, suspicious for small abscess. 3.  Increased proximal small bowel dilatation consistent with partial small bowel obstruction.   Original Report Authenticated By: Danae Orleans, M.D.     Review of Systems  Constitutional: Negative.   HENT: Negative.   Eyes: Negative.   Respiratory: Negative.   Cardiovascular: Negative.   Gastrointestinal: Positive for nausea, abdominal pain, constipation and blood in stool (seen in ER about 3 mo ago and treated doing better since.). Negative for heartburn, vomiting and diarrhea. Melena: occasionally with constipation.  Genitourinary: Negative.   Musculoskeletal: Negative.   Skin: Positive for rash (rash left hand 2nd fineger for some years.). Negative for itching.  Neurological: Negative.   Endo/Heme/Allergies: Negative.   Psychiatric/Behavioral: Negative.    Blood pressure 147/66, pulse 84, temperature 98.7 F (37.1 C), temperature source Oral, resp. rate 16, SpO2 100.00%. Physical Exam  Constitutional: She is oriented to person, place, and  time. She appears well-developed and well-nourished. No distress.  HENT:  Head: Normocephalic and atraumatic.  Nose: Nose normal.  Eyes: Conjunctivae normal and EOM are normal. Pupils are equal, round, and reactive to light. Right eye exhibits no discharge. Left eye exhibits no discharge. No scleral icterus.  Neck: Normal range of motion. Neck supple. No JVD present. No tracheal deviation present. No thyromegaly present.  Cardiovascular: Normal rate, regular rhythm and intact distal pulses.  Exam reveals no gallop.   Murmur (soft aortic mumur) heard. Respiratory: Breath sounds normal. No stridor. No respiratory distress. She has no wheezes. She has no rales. She exhibits no tenderness.  GI: Soft. Bowel sounds are normal. She exhibits no distension and no mass. There is no tenderness. There is no rebound and no guarding.  Musculoskeletal: Normal range of motion. She exhibits no edema.  Lymphadenopathy:    She has no cervical adenopathy.  Neurological: She is alert and oriented to person, place, and time. She has normal reflexes.  Skin: Skin is warm and dry. No rash noted. She is not diaphoretic. No erythema (Rash left 2nd finger says it's been there for years.).  Psychiatric: Her behavior is normal. Judgment and thought content normal.    Assessment/Plan: 1. Small bowel inflammatory changes, possible small abscess with air. 2. Probable mild diverticular disease. She has never had a colonoscopy. 3. History of hypertension, the patient says she has not needed treatment for 20 years. Plan: Dr. Lindie Spruce saw the patient and recommended admission with bowel rest, IV antibiotic, and observation. Patient was concerned, and did not warrant admission at this point. Her pain had been relieved with  medication, she had no peritonitis, she did not appear septic, or acutely ill. At that point Dr. Lindie Spruce recommended oral antibiotics for 10 days and liquids for the next 48 hour. Patient will not be by medicines  until tomorrow so we plan to give her a dose of Cipro and Flagyl in the hospital. She can followup as an outpatient on a when necessary basis. If she has problems she is to return to the hospital for evaluation as soon as possible.  [facility] physician assistant for Dr. Megan Mans.  Garron Eline 01/07/2012, 3:33 PM

## 2012-01-07 NOTE — Consult Note (Signed)
The patient does not appear very sick.  However, she has had some pain medication.  Very little tenderness.  WBC 12K, great bowel sounds.  I believe this can be treated with oral antibiotics.  Seems like a small peri-diverticular abscess.  Will follow as outpatient.  [name]. Gae Bon, MD, FACS [phone] [facility]

## 2012-01-07 NOTE — ED Notes (Signed)
Surgery in with patient

## 2012-01-07 NOTE — Progress Notes (Signed)
56 yo woman with epigastric and RLQ pain for the past 4 days.  Seen 3 days ago, and CT suggested inflammatory changes in the distal small bowel and a possible intra-abdominal abscess.  She was released on pain medications only.  Her pain has persisted and she therefore sought re-evaluation.  Exam shows her to be in moderate distress with abdominal pain.  She localizes the pain to the epigastrium and RLQ; she has mild tenderness in the RLQ.  Repeat lab and CT of the abdomen continue to show a small intra-abdominal abscess.  Pt had consultation with Jimmye Norman, M.D., who felt she had a microperforation, that should be treated as an outpatient with Cipro/Flagyl and pain medication.

## 2012-01-07 NOTE — ED Provider Notes (Signed)
Medical screening examination/treatment/procedure(s) were conducted as a shared visit with non-physician practitioner(s) and myself.  I personally evaluated the patient during the encounter  See my note and eval.    Shelda Jakes, MD 01/07/12 2249

## 2012-01-07 NOTE — ED Notes (Addendum)
Patient was seen here on Monday with IBS and given prescription for same.  She states "unable to get medications and is having pain".  Unable to get to work today.

## 2012-01-07 NOTE — ED Notes (Signed)
Finished drinking contrast-CT notified.

## 2012-01-08 NOTE — ED Provider Notes (Signed)
Medical screening examination/treatment/procedure(s) were conducted as a shared visit with non-physician practitioner(s) and myself.  I personally evaluated the patient during the encounter 56 yo woman with epigastric and RLQ pain for the past 4 days. Seen 3 days ago, and CT suggested inflammatory changes in the distal small bowel and a possible intra-abdominal abscess. She was released on pain medications only. Her pain has persisted and she therefore sought re-evaluation. Exam shows her to be in moderate distress with abdominal pain. She localizes the pain to the epigastrium and RLQ; she has mild tenderness in the RLQ. Repeat lab and CT of the abdomen continue to show a small intra-abdominal abscess. Pt had consultation with Jimmye Norman, M.D., who felt she had a microperforation, that should be treated as an outpatient with Cipro/Flagyl and pain medication.    Carleene Cooper III, MD 01/08/12 [phone]

## 2012-02-16 ENCOUNTER — Inpatient Hospital Stay (HOSPITAL_COMMUNITY): Payer: Medicaid Other

## 2012-02-16 ENCOUNTER — Encounter (HOSPITAL_COMMUNITY): Payer: Self-pay | Admitting: Emergency Medicine

## 2012-02-16 ENCOUNTER — Inpatient Hospital Stay (HOSPITAL_COMMUNITY)
Admission: EM | Admit: 2012-02-16 | Discharge: 2012-02-24 | DRG: 375 | Disposition: A | Discharge status: Home / Self Care | Payer: Medicaid Other | Attending: Internal Medicine | Admitting: Internal Medicine

## 2012-02-16 DIAGNOSIS — R45851 Suicidal ideations: Secondary | ICD-10-CM

## 2012-02-16 DIAGNOSIS — C189 Malignant neoplasm of colon, unspecified: Secondary | ICD-10-CM | POA: Diagnosis present

## 2012-02-16 DIAGNOSIS — K5730 Diverticulosis of large intestine without perforation or abscess without bleeding: Secondary | ICD-10-CM | POA: Diagnosis present

## 2012-02-16 DIAGNOSIS — R7881 Bacteremia: Secondary | ICD-10-CM | POA: Diagnosis present

## 2012-02-16 DIAGNOSIS — K644 Residual hemorrhoidal skin tags: Secondary | ICD-10-CM | POA: Diagnosis present

## 2012-02-16 DIAGNOSIS — E876 Hypokalemia: Secondary | ICD-10-CM | POA: Diagnosis not present

## 2012-02-16 DIAGNOSIS — R05 Cough: Secondary | ICD-10-CM | POA: Diagnosis not present

## 2012-02-16 DIAGNOSIS — N179 Acute kidney failure, unspecified: Secondary | ICD-10-CM

## 2012-02-16 DIAGNOSIS — IMO0002 Reserved for concepts with insufficient information to code with codable children: Secondary | ICD-10-CM | POA: Diagnosis present

## 2012-02-16 DIAGNOSIS — D126 Benign neoplasm of colon, unspecified: Secondary | ICD-10-CM | POA: Diagnosis present

## 2012-02-16 DIAGNOSIS — D649 Anemia, unspecified: Secondary | ICD-10-CM | POA: Diagnosis present

## 2012-02-16 DIAGNOSIS — I10 Essential (primary) hypertension: Secondary | ICD-10-CM | POA: Diagnosis present

## 2012-02-16 DIAGNOSIS — D62 Acute posthemorrhagic anemia: Secondary | ICD-10-CM | POA: Diagnosis present

## 2012-02-16 DIAGNOSIS — C187 Malignant neoplasm of sigmoid colon: Principal | ICD-10-CM | POA: Diagnosis present

## 2012-02-16 DIAGNOSIS — K769 Liver disease, unspecified: Secondary | ICD-10-CM | POA: Diagnosis present

## 2012-02-16 DIAGNOSIS — C787 Secondary malignant neoplasm of liver and intrahepatic bile duct: Secondary | ICD-10-CM | POA: Diagnosis present

## 2012-02-16 DIAGNOSIS — R197 Diarrhea, unspecified: Secondary | ICD-10-CM | POA: Diagnosis present

## 2012-02-16 DIAGNOSIS — K648 Other hemorrhoids: Secondary | ICD-10-CM | POA: Diagnosis present

## 2012-02-16 DIAGNOSIS — R109 Unspecified abdominal pain: Secondary | ICD-10-CM | POA: Diagnosis present

## 2012-02-16 DIAGNOSIS — R059 Cough, unspecified: Secondary | ICD-10-CM | POA: Diagnosis not present

## 2012-02-16 DIAGNOSIS — K922 Gastrointestinal hemorrhage, unspecified: Secondary | ICD-10-CM | POA: Diagnosis present

## 2012-02-16 LAB — IRON AND TIBC

## 2012-02-16 LAB — COMPREHENSIVE METABOLIC PANEL
Alkaline Phosphatase: 77 U/L (ref 39–117)
BUN: 16 mg/dL (ref 6–23)
CO2: 23 mEq/L (ref 19–32)
Chloride: 102 mEq/L (ref 96–112)
Creatinine, Ser: 1.15 mg/dL — ABNORMAL HIGH (ref 0.50–1.10)
GFR calc Af Amer: 60 mL/min — ABNORMAL LOW (ref >90)
GFR calc non Af Amer: 52 mL/min — ABNORMAL LOW (ref >90)
Glucose, Bld: 99 mg/dL (ref 70–99)
Potassium: 4 mEq/L (ref 3.5–5.1)
Total Bilirubin: 0.2 mg/dL — ABNORMAL LOW (ref 0.3–1.2)

## 2012-02-16 LAB — CBC WITH DIFFERENTIAL/PLATELET
Basophils Relative: 0 % (ref 0–1)
HCT: 21 % — ABNORMAL LOW (ref 36.0–46.0)
Hemoglobin: 6.6 g/dL — CL (ref 12.0–15.0)
Lymphocytes Relative: 12 % (ref 12–46)
Lymphs Abs: 1.4 10*3/uL (ref 0.7–4.0)
MCHC: 31.4 g/dL (ref 30.0–36.0)
Monocytes Absolute: 0.4 10*3/uL (ref 0.1–1.0)
Monocytes Relative: 4 % (ref 3–12)
Neutro Abs: 9.3 10*3/uL — ABNORMAL HIGH (ref 1.7–7.7)
RBC: 2.75 MIL/uL — ABNORMAL LOW (ref 3.87–5.11)

## 2012-02-16 LAB — RETICULOCYTES
RBC.: 2.62 MIL/uL — ABNORMAL LOW (ref 3.87–5.11)
Retic Ct Pct: 2.2 % (ref 0.4–3.1)

## 2012-02-16 LAB — OCCULT BLOOD, POC DEVICE: Fecal Occult Bld: POSITIVE

## 2012-02-16 LAB — LIPID PANEL
Cholesterol: 168 mg/dL (ref 0–200)
LDL Cholesterol: 105 mg/dL — ABNORMAL HIGH (ref 0–99)
Total CHOL/HDL Ratio: 3.1 RATIO
VLDL: 8 mg/dL (ref 0–40)

## 2012-02-16 LAB — PROTIME-INR: INR: 1.13 (ref 0.00–1.49)

## 2012-02-16 LAB — VITAMIN B12: Vitamin B-12: 467 pg/mL (ref 211–911)

## 2012-02-16 LAB — PREPARE RBC (CROSSMATCH)

## 2012-02-16 MED ORDER — IOHEXOL 300 MG/ML  SOLN
100.0000 mL | Freq: Once | INTRAMUSCULAR | Status: AC | PRN
  Administered 2012-02-16: 100 mL via INTRAVENOUS

## 2012-02-16 MED ORDER — PANTOPRAZOLE SODIUM 40 MG IV SOLR
40.0000 mg | Freq: Two times a day (BID) | INTRAVENOUS | Status: DC
Start: 2012-02-16 — End: 2012-02-19
  Administered 2012-02-17 – 2012-02-19 (×6): 40 mg via INTRAVENOUS
  Filled 2012-02-16 (×8): qty 40

## 2012-02-16 MED ORDER — SODIUM CHLORIDE 0.9 % IV SOLN
INTRAVENOUS | Status: DC
  Administered 2012-02-17 – 2012-02-20 (×7): via INTRAVENOUS
  Administered 2012-02-20 – 2012-02-21 (×2): 75 mL via INTRAVENOUS
  Administered 2012-02-21: 1000 mL via INTRAVENOUS
  Administered 2012-02-22 – 2012-02-23 (×3): via INTRAVENOUS
  Administered 2012-02-24: 75 mL/h via INTRAVENOUS

## 2012-02-16 MED ORDER — IOHEXOL 300 MG/ML  SOLN
20.0000 mL | INTRAMUSCULAR | Status: AC
  Administered 2012-02-16 (×2): 20 mL via ORAL

## 2012-02-16 MED ORDER — ONDANSETRON HCL 4 MG/2ML IJ SOLN
4.0000 mg | INTRAMUSCULAR | Status: DC | PRN
  Administered 2012-02-16: 4 mg via INTRAVENOUS
  Filled 2012-02-16: qty 2

## 2012-02-16 MED ORDER — ONDANSETRON HCL 4 MG PO TABS: 4.0000 mg | ORAL_TABLET | Freq: Four times a day (QID) | ORAL | Status: DC | PRN

## 2012-02-16 MED ORDER — ONDANSETRON HCL 4 MG/2ML IJ SOLN
4.0000 mg | Freq: Four times a day (QID) | INTRAMUSCULAR | Status: DC | PRN
  Administered 2012-02-17: 4 mg via INTRAVENOUS
  Filled 2012-02-16: qty 2

## 2012-02-16 MED ORDER — MORPHINE SULFATE 2 MG/ML IJ SOLN: 1.0000 mg | INTRAMUSCULAR | Status: DC | PRN

## 2012-02-16 MED ORDER — MORPHINE SULFATE 4 MG/ML IJ SOLN
4.0000 mg | Freq: Once | INTRAMUSCULAR | Status: AC
  Administered 2012-02-16: 4 mg via INTRAVENOUS
  Filled 2012-02-16: qty 1

## 2012-02-16 MED ORDER — SODIUM CHLORIDE 0.9 % IV BOLUS (SEPSIS)
1000.0000 mL | Freq: Once | INTRAVENOUS | Status: AC
  Administered 2012-02-16: 1000 mL via INTRAVENOUS

## 2012-02-16 NOTE — ED Provider Notes (Signed)
History     CSN: [id number]  Arrival date & time 02/16/12  1236   First MD Initiated Contact with Patient 02/16/12 1307      Chief Complaint  Patient presents with  . Abdominal Pain    (Consider location/radiation/quality/duration/timing/severity/associated sxs/prior treatment) HPI Comments: Patient is a 56 year old female with a history of hypertension and recent diagnosis of small diverticular abscess (diagnosed on 9/12, Dr. Lindie Spruce treated as an outpatient with Flagyl and Cipro) that presents emergency department today complaining of abdominal pain.  Onset of pain began today while at work at approximately 1130 a.m., was rated at 8/10 in severity, but now 0/10.  Symptoms were associated with cold sweats, & feeling very dizzy and weak lately.  In addition she reports having pink diarrhea 6-7 times since yesterday. She denies syncope, fever, NS, chills, hematemesis, melena, nausea, vomiting, rectal pain or HA.   The history is provided by the patient.    Past Medical History  Diagnosis Date  . Hypertension     pt states she stopped taking meds while in her 20's    Past Surgical History  Procedure Date  . Abdominal hysterectomy     No family history on file.  History  Substance Use Topics  . Smoking status: Never Smoker   . Smokeless tobacco: Never Used  . Alcohol Use: Yes     occassional    OB History    Grav Para Term Preterm Abortions TAB SAB Ect Mult Living                  Review of Systems  Constitutional: Positive for fatigue. Negative for fever, diaphoresis and activity change.  HENT: Negative for congestion and neck pain.   Respiratory: Negative for cough.   Gastrointestinal: Positive for abdominal pain, diarrhea and blood in stool.  Genitourinary: Negative for dysuria.  Musculoskeletal: Negative for myalgias.  Skin: Negative for color change and wound.  Neurological: Positive for dizziness, weakness and light-headedness. Negative for headaches.  All  other systems reviewed and are negative.    Allergies  Review of patient's allergies indicates no known allergies.  Home Medications   Current Outpatient Rx  Name Route Sig Dispense Refill  . IBUPROFEN 200 MG PO TABS Oral Take 200-400 mg by mouth every 6 (six) hours as needed. For pain      BP 136/75  Pulse 84  Temp 98.6 F (37 C) (Oral)  Resp 18  Ht 5\' 10"  (1.778 m)  Wt 220 lb (99.791 kg)  BMI 31.57 kg/m2  SpO2 100%  Physical Exam  Constitutional: She is oriented to person, place, and time. She appears well-developed and well-nourished. No distress.       Appears tired, fatigued  HENT:  Head: Normocephalic and atraumatic.  Mouth/Throat: Oropharynx is clear and moist. No oropharyngeal exudate.  Eyes: Conjunctivae normal and EOM are normal. Pupils are equal, round, and reactive to light. No scleral icterus.  Neck: Normal range of motion. Neck supple. No tracheal deviation present. No thyromegaly present.  Cardiovascular: Normal rate, regular rhythm, normal heart sounds and intact distal pulses.   Pulmonary/Chest: Effort normal and breath sounds normal. No stridor. No respiratory distress. She has no wheezes.  Abdominal:       Hyperactive bowel sounds. Soft, non tender. No peritoneal signs  Musculoskeletal: Normal range of motion. She exhibits no edema and no tenderness.  Neurological: She is alert and oriented to person, place, and time. Coordination normal.  Skin: Skin is warm and dry. No  rash noted. She is not diaphoretic. No erythema.  Psychiatric: She has a normal mood and affect. Her behavior is normal.    ED Course  Procedures (including critical care time)  Labs Reviewed  CBC WITH DIFFERENTIAL - Abnormal; Notable for the following:    WBC 11.2 (*)     RBC 2.75 (*)     Hemoglobin 6.6 (*)     HCT 21.0 (*)     MCV 76.4 (*)     MCH 24.0 (*)     RDW 16.3 (*)     Neutrophils Relative 83 (*)     Neutro Abs 9.3 (*)     All other components within normal limits    COMPREHENSIVE METABOLIC PANEL - Abnormal; Notable for the following:    Creatinine, Ser 1.15 (*)     Total Bilirubin 0.2 (*)     GFR calc non Af Amer 52 (*)     GFR calc Af Amer 60 (*)     All other components within normal limits  URINALYSIS, ROUTINE W REFLEX MICROSCOPIC  TYPE AND SCREEN  PREPARE RBC (CROSSMATCH)   No results found.   No diagnosis found.    MDM  Anemia, GI bleed, abdominal pain   56 year old female with a recent diagnosis of small peridiverticular abscess treated as an OP with Flagyl and Cipro.  Full course of antibiotics completed.  Patient presented today with abdominal pain, bloody diarrhea, lightheadedness, dizziness and fatigue.  Hemoglobin found to be 6.6.  2 units of packed red blood cells prepared and ordered to be transfused.  Patient will be admitted for anemia, GI bleed and abdominal pain.  Results discussed with patient who is agreeable with plan.  Colonoscopy recommended.. The patient appears reasonably stabilized for admission considering the current resources, flow, and capabilities available in the ED at this time, and I doubt any other [facility] requiring further screening and/or treatment in the ED prior to admission.        Jaci Carrel, [place] 02/16/12 1553

## 2012-02-16 NOTE — ED Notes (Signed)
Consent for blood product administration signed per pt

## 2012-02-16 NOTE — ED Notes (Signed)
Per patient report, she was at work today and started having severe abdominal pain.   Patient claims that she got "lightheaded, but no vomiting or nausea".  Patient claims that she "had this last time and it was a abscess in my lower intestines".   Patient claims that she having "cramps".

## 2012-02-16 NOTE — ED Provider Notes (Signed)
Medical screening examination/treatment/procedure(s) were conducted as a shared visit with non-physician practitioner(s) and myself.  I personally evaluated the patient during the encounter    Nelia Shi, MD 02/16/12 1555

## 2012-02-16 NOTE — ED Notes (Signed)
Report called to CDU RN - pt to go to CDU room 9

## 2012-02-16 NOTE — ED Notes (Signed)
Patient advised that we do need a urine.  Patient requested to wait until bolus went in to try.

## 2012-02-16 NOTE — ED Notes (Signed)
Lab called critical result to Jaycee Pelzer, RN - gave report to [facility], RN and advised of HgB 6.6.   Radford Pax, MD advised.

## 2012-02-16 NOTE — ED Notes (Signed)
Care transferred and report given. 

## 2012-02-16 NOTE — ED Notes (Signed)
Patient was symptomatic when standing.  Patient claims could not complete vital signs standing.

## 2012-02-16 NOTE — H&P (Signed)
Hospital Admission Note Date: 02/16/2012  Patient name: [name] Medical record number: [id number] Date of birth: [birth year] Age: 56 y.o. Gender: female PCP: DEFAULT,PROVIDER, MD  Medical Service: Internal Medicine Teaching Service--[place]  Attending physician:  Dr. Eben Burow    1st Contact: Dr. Cicero Duck 2nd Contact: Dr. Tamela Gammon After 5 pm or weekends: 1st Contact:      Pager: [phone] 2nd Contact:      Pager: [phone]  Chief Complaint: Abdominal Pain  History of Present Illness: [name] is a pleasant 56 year old African American female with past medical history of hypertension and diverticular abscess, partial small bowel obstruction, and microperforation (September 2013 treated with course of Cipro and Flagyl) presenting to the emergency room today complaining of severe abdominal pain while at work. [name] claims around lunchtime this afternoon she started experiencing severe sharp periumbilical abdominal pain 8/10 in severity, non-radiating, and associated with cold sweat, dizziness, bilateral shoulder pain, and feeling like she was going to faint. She claims she's been having crampy abdominal pain occasionally for at least one year but her last similar episode was last month. Her abdominal pain resolved while in the emergency room. She also endorses feeling tired and sluggish for the past 4 months, difficulty sleeping, and worries quite a lot about her financial troubles. She also claims to have pink/red colored bowel movements for the past month with her most recent episode of 6 loose bowel movements and crampy abdominal pain yesterday. The blood in her stool is noted more on her toilet paper. She denies any history of hemorrhoids but does claim that she has bouts of constipation or diarrhea in the past. She also denies fever, chills, headaches, chest pain, shortness of breath, or any urinary complaints at this time.  In the emergency room she is noted to have a  hemoglobin of 6.6 which is down from 9.4 in September 2013. She claims she has had one prior blood transfusion in 2003 when she had a hysterectomy for fibroids. She endorses no other past medical history or any other complaints at this time.  Meds: Current Outpatient Rx  Name Route Sig Dispense Refill  . IBUPROFEN 200 MG PO TABS Oral Take 200-400 mg by mouth every 6 (six) hours as needed. For pain     Allergies: Allergies as of 02/16/2012  . (No Known Allergies)   Past Medical History  Diagnosis Date  . Hypertension     pt states she stopped taking meds while in her 20's   Past Surgical History  Procedure Date  . Abdominal hysterectomy    No family history on file. History   Social History  . Marital Status: Single    Spouse Name: N/A    Number of Children: N/A  . Years of Education: N/A   Occupational History  . Not on file.   Social History Main Topics  . Smoking status: Never Smoker   . Smokeless tobacco: Never Used  . Alcohol Use: Yes     occassional  . Drug Use: No  . Sexually Active:    Other Topics Concern  . Not on file   Social History Narrative  . No narrative on file   Review of Systems: Pertinent items are noted in HPI.  Physical Exam: Blood pressure 154/83, pulse 80, temperature 98.7 F (37.1 C), temperature source Oral, resp. rate 16, height 5\' 10"  (1.778 m), weight 220 lb (99.791 kg), SpO2 100.00%. Vitals reviewed. General: resting in bed, NAD  HEENT: PERRLA, EOMI, no scleral icterus Cardiac: tachycardia, + systolic murmur 2-3/6 right sternal border Pulm: clear to auscultation bilaterally, no wheezes, rales, or rhonchi Abd: obese, soft, nontender to deep palpation, nondistended, +BS present, stretch marks, +FOBT, - rigid, -rebound tenderness, -cva tenderness Ext: warm and well perfused, no pedal edema, +2DP B/L Neuro: alert and oriented X3, cranial nerves II-XII grossly intact, strength and sensation to light touch equal in bilateral upper and  lower extremities  Lab results: Basic Metabolic Panel:  Basename 02/16/12 1433  NA 137  K 4.0  CL 102  CO2 23  GLUCOSE 99  BUN 16  CREATININE 1.15*  CALCIUM 9.7  MG --  PHOS --   Liver Function Tests:  Basename 02/16/12 1433  AST 14  ALT 9  ALKPHOS 77  BILITOT 0.2*  PROT 8.2  ALBUMIN 3.8   CBC:  Basename 02/16/12 1433  WBC 11.2*  NEUTROABS 9.3*  HGB 6.6*  HCT 21.0*  MCV 76.4*  PLT 336   Assessment & Plan by Problem: [name] is a pleasant 56 year old African American female with past medical history of hypertension and diverticular abscess, partial small bowel obstruction, and possible microperforation (September 2013 treated with course of Cipro and Flagyl) admitted for acute blood loss anemia (Hb on admission 6.6), blood in stool, and abdominal pain. CT A/P 12/2011: No significant change in moderate distal small bowel wall thickening and and surrounding inflammatory changes. Differential diagnosis includes Crohn's disease and in the fraction is enteritis, with bowel ischemia considered less likely. 2 cm fluid collection in the central pelvic small bowel mesentery, suspicious for small abscess. Increased proximal small bowel dilatation consistent with partial small bowel obstruction.   #1: Acute blood loss anemia--Hb 6.6 on admission, down from 9.4 and September 2013.  FOBT positive.  Possibly secondary to GI bleed as [name] reports pink/bright red blood in stool for approximately one month.  Other causes of blood loss may also be perforation of diverticular abscess or possible ulcer vs diverticulitis vs ulcerative colitis. Also complains of fatigue and occasional dizziness x 4 months.  - Admit to step down - Transfuse 2 units packed red blood cells -  f/u post transfusion Hb/Hct -  Followup EKG--NSR 77bpm -  monitor H/H 8H -  IVF bolus x1  -  IVF -  f/u orthostatic vital signs -  f/u TSH   #2: Abdominal Pain and Diarrhea--reports long-term history of crampy  abdominal pain with episodes of constipation and diarrhea. 6 loose bowel movements this morning. History of partial small bowel obstruction and diverticular abscess. Possibly secondary to diverticular abscess vs perforation vs diverticulitis or diverticulosis vs IBS or IBD. Given previous CT results from September 2013 showing moderate distal small bowel wall thickening and surrounding inflammatory changes along with bouts of diarrhea and constipation may be suggestive of possible inflammatory bowel disease.   -WBC 11.2--continue to monitor -f/u CT abdomen/pelvis -GI Consult in AM -morphine PRN   #3: GI bleed--FOBT x1 positive. Hemoglobin 6.6. See #1 and #2 -monitor H/H -IV protonix -Zofran PRN -consider colonoscopy  #4: Acute Kidney Injury: Cr 1.15 on admission; possibly prerenal secondary to dehydration.  Has not eaten or drank fluids today.  -f/u urine sodium and Cr -IVF -F/U bmet  Diet: NPO for now DVT Px: SCDs Dispo: pending clinical improvement    Signed: Darden Palmer 02/16/2012, 5:20 PM

## 2012-02-17 LAB — SODIUM, URINE, RANDOM: Sodium, Ur: 61 mEq/L

## 2012-02-17 LAB — CBC
HCT: 23.2 % — ABNORMAL LOW (ref 36.0–46.0)
Hemoglobin: 7.5 g/dL — ABNORMAL LOW (ref 12.0–15.0)
MCH: 25.3 pg — ABNORMAL LOW (ref 26.0–34.0)
MCHC: 32.4 g/dL (ref 30.0–36.0)
MCHC: 32.3 g/dL (ref 30.0–36.0)
MCHC: 31.5 g/dL (ref 30.0–36.0)
MCV: 77.9 fL — ABNORMAL LOW (ref 78.0–100.0)
Platelets: 220 10*3/uL (ref 150–400)
Platelets: 283 10*3/uL (ref 150–400)
Platelets: 288 10*3/uL (ref 150–400)
RBC: 3.17 MIL/uL — ABNORMAL LOW (ref 3.87–5.11)
RDW: 17.4 % — ABNORMAL HIGH (ref 11.5–15.5)
RDW: 17.2 % — ABNORMAL HIGH (ref 11.5–15.5)
RDW: 17.1 % — ABNORMAL HIGH (ref 11.5–15.5)
RDW: 17.2 % — ABNORMAL HIGH (ref 11.5–15.5)
WBC: 9.3 10*3/uL (ref 4.0–10.5)
WBC: 10 10*3/uL (ref 4.0–10.5)
WBC: 9.9 10*3/uL (ref 4.0–10.5)

## 2012-02-17 LAB — URINALYSIS, ROUTINE W REFLEX MICROSCOPIC
Ketones, ur: NEGATIVE mg/dL
Leukocytes, UA: NEGATIVE
Protein, ur: NEGATIVE mg/dL
Urobilinogen, UA: 0.2 mg/dL (ref 0.0–1.0)

## 2012-02-17 LAB — BASIC METABOLIC PANEL
Calcium: 9.1 mg/dL (ref 8.4–10.5)
GFR calc Af Amer: 81 mL/min — ABNORMAL LOW (ref >90)
GFR calc non Af Amer: 70 mL/min — ABNORMAL LOW (ref >90)
Glucose, Bld: 86 mg/dL (ref 70–99)
Potassium: 4.2 mEq/L (ref 3.5–5.1)
Sodium: 136 mEq/L (ref 135–145)

## 2012-02-17 LAB — CREATININE, URINE, RANDOM: Creatinine, Urine: 55.18 mg/dL

## 2012-02-17 LAB — HEMOGLOBIN A1C: Hgb A1c MFr Bld: 5.9 % — ABNORMAL HIGH (ref <5.7)

## 2012-02-17 MED ORDER — CIPROFLOXACIN HCL 500 MG PO TABS
500.0000 mg | ORAL_TABLET | Freq: Two times a day (BID) | ORAL | Status: DC
  Administered 2012-02-17 – 2012-02-18 (×3): 500 mg via ORAL
  Filled 2012-02-17 (×5): qty 1

## 2012-02-17 MED ORDER — GUAIFENESIN 200 MG PO TABS
200.0000 mg | ORAL_TABLET | ORAL | Status: DC | PRN
  Administered 2012-02-17: 200 mg via ORAL
  Filled 2012-02-17 (×2): qty 1

## 2012-02-17 MED ORDER — METRONIDAZOLE 500 MG PO TABS
500.0000 mg | ORAL_TABLET | Freq: Three times a day (TID) | ORAL | Status: DC
  Administered 2012-02-17 – 2012-02-24 (×20): 500 mg via ORAL
  Filled 2012-02-17 (×24): qty 1

## 2012-02-17 MED ORDER — PEG 3350-KCL-NA BICARB-NACL 420 G PO SOLR
4000.0000 mL | Freq: Once | ORAL | Status: AC
  Administered 2012-02-17: 4000 mL via ORAL
  Filled 2012-02-17: qty 4000

## 2012-02-17 MED ORDER — SODIUM CHLORIDE 0.9 % IJ SOLN
INTRAMUSCULAR | Status: AC
  Administered 2012-02-17: 10 mL
  Filled 2012-02-17: qty 10

## 2012-02-17 NOTE — H&P (Signed)
Internal Medicine Attending Admission Note Date: 02/17/2012  Patient name: [name] Medical record number: [id number] Date of birth: Nov 18, 1955 Age: 56 y.o. Gender: female  I saw and evaluated the patient. I reviewed the resident's note and I agree with the resident's findings and plan as documented in the resident's note.  Chief Complaint(s): Dizziness  History - key components related to admission: [name] is a very pleasant 56 year old female with PMH most significant of diverticular abscess back in September which was treated with antibiotics presented to ER with new onset dizziness and abdominal pain. 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 She was at work yesterday then she suddenly started feeling dizzy. Dizziness described as feeling of about to fall. A/w 8/10 abdominal pain, present in the middle of her belly, non radiating and was associated with cold sweats. She denies any chest pain, palpitations, fever, chills, nausea or vomiting. She has regular BM but since 2-3 days she has had loose stools. No blood noted in last 3 days but she does have h/o hemarhoids and reports several episodes of blood tinged stools in last several weeks.    Patient did have a history of murmur as a child.  Physical Exam - key components related to admission:  Filed Vitals:   02/16/12 2345 02/17/12 0045 02/17/12 0349 02/17/12 0800  BP: 135/56 155/72 158/69 178/86  Pulse:   74 71  Temp: 99.6 F (37.6 C) 98.2 F (36.8 C) 98.7 F (37.1 C) 98.6 F (37 C)  TempSrc: Oral  Oral Oral  Resp:   21 15  Height:      Weight:      SpO2:   99% 99%  Lying in bed comfortably CVS: III/IV, mid systolic late peaking murmur, best heard at right sternal border, Loud S2, no rubs or gallops, RRR Chest: CTAB Abd: Soft, NT, ND, no organomegaly Ext: no edema noted  Lab results:   Basic Metabolic Panel:  Basename 02/17/12 0139 02/16/12 1433  NA 136 137  K 4.2 4.0  CL 105 102  CO2 19 23  GLUCOSE 86 99  BUN  12 16  CREATININE 0.90 1.15*  CALCIUM 9.1 9.7  MG -- --  PHOS -- --   Liver Function Tests:  [facility] 02/16/12 1433  AST 14  ALT 9  ALKPHOS 77  BILITOT 0.2*  PROT 8.2  ALBUMIN 3.8   CBC:  Basename 02/17/12 0139 02/16/12 1433  WBC 10.7* 11.2*  NEUTROABS -- 9.3*  HGB 7.9* 6.6*  HCT 24.4* 21.0*  MCV 78.2 76.4*  PLT 220 336   Cardiac Enzymes:  Basename 02/16/12 1744  CKTOTAL --  CKMB --  CKMBINDEX --  TROPONINI <0.30   Hemoglobin A1C:  Basename 02/16/12 1744  HGBA1C 5.9*   Fasting Lipid Panel:  Basename 02/16/12 1744  CHOL 168  HDL 55  LDLCALC 105*  TRIG 42  CHOLHDL 3.1  LDLDIRECT --   Thyroid Function Tests:  Basename 02/16/12 1744  TSH 1.218  T4TOTAL --  FREET4 --  T3FREE --  THYROIDAB --   Anemia Panel:  Basename 02/16/12 1744  VITAMINB12 467  FOLATE 16.7  FERRITIN 5*  TIBC Not calculated due to Iron <10.  IRON <10*  RETICCTPCT 2.2   Coagulation:  Basename 02/16/12 1744  INR 1.13     Imaging results:  Ct Abdomen Pelvis W Contrast  02/16/2012 IMPRESSION:  1.  Lesion in segment 6 of the liver is mildly hyperdense with a low density rim.  Differential  diagnostic considerations include hepatic adenoma, inflammatory pseudotumor/infection, hepatocellular carcinoma, hypervascular metastatic lesion, and peripheral cholangiocarcinoma.  MRI with without contrast using dynamic liver protocol can sometimes help in differentiation.  Biopsy may be warranted. 2.  Persistent marked wall thickening of a loop of distal ileum, with attenuation of the lumen but no obstruction, compatible with transmural inflammation associated with inflammatory bowel disease or mass.  There is also adjacent wall thickening in the sigmoid colon, possibly due to secondary inflammation or underlying mass. Indistinct mesenteric stranding is present in this vicinity.  No drainable abscess observed. 3.  Bilateral sacroiliitis.   Original Report Authenticated By: Dellia Cloud, M.D.     Other results: EKG: Normal sinus rhythm without any ST or T wave changes  Assessment & Plan by Problem:  Patient is a 56 year old female with past medical history of hypertension and diverticular abscess now comes in with new onset dizziness and abdominal pain.  #1 Abdominal pain: Most likely secondary to infectious etiology like ileitis or abscess as seen on previous CT scans but inflammatory bowel disease is high on differential. CT scan is suggestive of inflammation of the terminal ileum and wall thickening in sigmoid colon. We would start Cipro and Flagyl to cover for anaerobic organisms. Blood cultures pending. Patient will need a colonoscopy to assess the cause of her recurrent inflammation. GI consult pending.  #2 GI bleed: Although patient has had history of hemorrhoids, the pattern of inflammation is consistent with Crohn's disease. Patient will need colonoscopy to assess for the same. Patient may also benefit from disease modifying agents like sulfasalazine once we have her diagnosis at hand. Patient currently does not have bowel movements with active bleeding and hence we can hold steroids or sulfasalazine at this time. Patient was transfused 2 units for symptomatic anemia. Hold transfusions until patient's hemoglobin is less than 7.  #3 liver mass: Differentials include hepatic adenoma, inflammatory pseudotumor or infection, hepatocellular carcinoma, hypervascular metastatic lesion and peripheral cholangiocarcinoma. Radiology has suggested MRI with and without contrast using dynamic liver protocol to further differentiate the mass. I am in favor of consulting surgeons for possible liver biopsy. We would appreciate GI to help make the decision regarding further management.   Rest of the medical management as per resident's note.  Lars Mage MD Pager [phone]

## 2012-02-17 NOTE — Progress Notes (Signed)
Subjective: [name] was seen and examined at bedside today.  She claims to be feeling much better today and less tired than admission with no complaints of abdominal pain.  She was transfused 2 units PRBCs yesterday and Hb 7.5 today.  She has not had a BM this morning.  [name] is very hungry and wishes to have a regular meal.  I have explained to her the results of her CT scan in detail and that GI was consulted.  She denies any fever, chills, N/V/D, abdominal pain, chest pain, or shortness of breath at this time.    Objective: Vital signs in last 24 hours: Filed Vitals:   02/17/12 0349 02/17/12 0800 02/17/12 1022 02/17/12 1200  BP: 158/69 178/86 158/95 154/89  Pulse: 74 71 78 78  Temp: 98.7 F (37.1 C) 98.6 F (37 C)  98.5 F (36.9 C)  TempSrc: Oral Oral  Oral  Resp: 21 15 14 17   Height:      Weight:      SpO2: 99% 99% 99% 100%   Weight change:   Intake/Output Summary (Last 24 hours) at 02/17/12 1420 Last data filed at 02/17/12 1200  Gross per 24 hour  Intake   2610 ml  Output    700 ml  Net   1910 ml   Vitals reviewed. General: resting in bed, NAD HEENT: PERRLA, EOMI, no scleral icterus Cardiac: RRR,+ systolic murmur 2-3/6 loudest at right sternal border Pulm: clear to auscultation bilaterally, no wheezes, rales, or rhonchi Abd: soft, obese, nontender, nondistended, BS present, stretch marks Ext: warm and well perfused, no pedal edema, +2dp b/l Neuro: alert and oriented X3, cranial nerves II-XII grossly intact, strength and sensation to light touch equal in bilateral upper and lower extremities  Lab Results: Basic Metabolic Panel:  Lab 02/17/12 9562 02/16/12 1433  NA 136 137  K 4.2 4.0  CL 105 102  CO2 19 23  GLUCOSE 86 99  BUN 12 16  CREATININE 0.90 1.15*  CALCIUM 9.1 9.7  MG -- --  PHOS -- --   Liver Function Tests:  Lab 02/16/12 1433  AST 14  ALT 9  ALKPHOS 77  BILITOT 0.2*  PROT 8.2  ALBUMIN 3.8   CBC:  Lab 02/17/12 1100 02/17/12 0139 02/16/12  1433  WBC 9.3 10.7* --  NEUTROABS -- -- 9.3*  HGB 7.5* 7.9* --  HCT 23.2* 24.4* --  MCV 78.1 78.2 --  PLT 283 220 --   Cardiac Enzymes:  Lab 02/16/12 1744  CKTOTAL --  CKMB --  CKMBINDEX --  TROPONINI <0.30   Hemoglobin A1C:  Lab 02/16/12 1744  HGBA1C 5.9*   Fasting Lipid Panel:  Lab 02/16/12 1744  CHOL 168  HDL 55  LDLCALC 105*  TRIG 42  CHOLHDL 3.1  LDLDIRECT --   Thyroid Function Tests:  Lab 02/16/12 1744  TSH 1.218  T4TOTAL --  FREET4 --  T3FREE --  THYROIDAB --   Coagulation:  Lab 02/16/12 1744  LABPROT 14.3  INR 1.13   Anemia Panel:  Lab 02/16/12 1744  VITAMINB12 467  FOLATE 16.7  FERRITIN 5*  TIBC Not calculated due to Iron <10.  IRON <10*  RETICCTPCT 2.2   Urinalysis:  Lab 02/17/12 0312  COLORURINE YELLOW  LABSPEC 1.019  PHURINE 5.5  GLUCOSEU NEGATIVE  HGBUR NEGATIVE  BILIRUBINUR NEGATIVE  KETONESUR NEGATIVE  PROTEINUR NEGATIVE  UROBILINOGEN 0.2  NITRITE NEGATIVE  LEUKOCYTESUR NEGATIVE   Micro Results: Recent Results (from the past 240 hour(s))  MRSA PCR SCREENING     Status: Normal   Collection Time   02/16/12 11:18 PM      Component Value Range Status Comment   MRSA by PCR NEGATIVE  NEGATIVE Final    Studies/Results: Ct Abdomen Pelvis W Contrast  02/16/2012  *RADIOLOGY REPORT*  Clinical Data: Abdomen pain, diarrhea, hx SBO and bowel abscess  CT ABDOMEN AND PELVIS WITH CONTRAST  Technique:  Multidetector CT imaging of the abdomen and pelvis was performed following the standard protocol during bolus administration of intravenous contrast.  Contrast: OMNIPAQUE IOHEXOL 300 MG/ML  SOLN  Comparison: 01/07/2012  Findings: Minimal scarring noted in the posterior basal segment left lower lobe.  Increased conspicuity of a lesion in segment 6 of the liver noted, measuring 4.5 x 3.1 cm with an approximately 1.1 cm low density periphery and a 2.3 cm central portion of higher density than the liver.  The central portion remains high  in density on delayed images.  No additional liver lesions observed.  Small accessory spleen noted.  No perisplenic ascites.  Pancreas unremarkable.  Adrenal glands unremarkable.  The gallbladder and biliary system appear unremarkable.  Small retroperitoneal lymph nodes are not pathologically enlarged by size criteria.  Retroaortic left renal vein noted.  Orally administered contrast extends to the cecum.  Abnormal thickened loop of distal ileum is again observed with poor definition of the bowel wall and adjacent prominent mesenteric stranding on image 60 of series 2.  No extraluminal oral contrast medium.  The proximal segment of the appendix appears unremarkable, with the distal portion extending towards the mesenteric edema and somewhat obscured.  There is also wall thickening of the sigmoid colon adjacent to the thickened loop of ileum.  I do not observe a drainable abscess.  Urinary bladder unremarkable.  Mild sclerosis in the left pubic body noted, and there is sacroiliac joint sclerosis suggesting bilateral sacroiliitis.  Mildly exaggerated lumbar lordosis noted with facet arthropathy. Uterus absent.  IMPRESSION:  1.  Lesion in segment 6 of the liver is mildly hyperdense with a low density rim.  Differential diagnostic considerations include hepatic adenoma, inflammatory pseudotumor/infection, hepatocellular carcinoma, hypervascular metastatic lesion, and peripheral cholangiocarcinoma.  MRI with without contrast using dynamic liver protocol can sometimes help in differentiation.  Biopsy may be warranted. 2.  Persistent marked wall thickening of a loop of distal ileum, with attenuation of the lumen but no obstruction, compatible with transmural inflammation associated with inflammatory bowel disease or mass.  There is also adjacent wall thickening in the sigmoid colon, possibly due to secondary inflammation or underlying mass. Indistinct mesenteric stranding is present in this vicinity.  No drainable abscess  observed. 3.  Bilateral sacroiliitis.   Original Report Authenticated By: Dellia Cloud, M.D.    Medications: I have reviewed the patient's current medications. Scheduled Meds:    . ciprofloxacin  500 mg Oral BID  . iohexol  20 mL Oral Q1 Hr x 2  . metroNIDAZOLE  500 mg Oral Q8H  .  morphine injection  4 mg Intravenous Once  . pantoprazole (PROTONIX) IV  40 mg Intravenous Q12H  . sodium chloride  1,000 mL Intravenous Once  . sodium chloride       Continuous Infusions:    . sodium chloride 150 mL/hr at 02/17/12 1200   PRN Meds:.iohexol, morphine injection, ondansetron (ZOFRAN) IV, ondansetron, DISCONTD: ondansetron Assessment/Plan:  [name] is a pleasant 56 year old African American female with past medical history of hypertension and diverticular abscess, partial small bowel obstruction,  and possible microperforation (September 2013 treated with course of Cipro and Flagyl) admitted for acute blood loss anemia (Hb on admission 6.6), blood in stool, and abdominal pain. CT A/P Lesion in segment 6 of the liver is mildly hyperdense with a low density rim.  Persistent marked wall thickening of a loop of distal ileum compatible with transmural inflammation associated with inflammatory bowel disease or mass. There is also adjacent wall thickening in the sigmoid colon, possibly due to secondary inflammation or underlying mass. Indistinct mesenteric stranding is present in this vicinity. Bilateral sacroiliitis.  #1: Acute blood loss anemia--Hb 6.6 on admission, down from 9.4 and September 2013. FOBT positive.Likely secondary to GI bleed as [name] reports pink/bright red blood in stool for approximately one month. Other causes of blood loss may also be perforation of diverticular abscess or possible ulcer vs diverticulitis vs IBD--crohn's or ulcerative colitis. Also complains of fatigue and occasional dizziness x 4 months.  - Transfer to med/surg  - Transfused 2 units packed red blood cells  02/16/12  - f/u post transfusion Hb/Hct--7.5   - Followup EKG--NSR 77bpm  - monitor H/H 6H--consider transfusing 1 unit PRBC if repeat cbc <7.5  - IVF  - f/u orthostatic vital signs  - f/u TSH--1.218  #2: Abdominal Pain and Diarrhea--reports long-term history of crampy abdominal pain with episodes of constipation and diarrhea. 6 loose bowel movements this morning. History of partial small bowel obstruction and diverticular abscess. Possibly secondary to mass vs. IBS or IBD vs. diverticular abscess vs perforation vs diverticulitis or diverticulosis. Given previous CT results from September 2013 showing moderate distal small bowel wall thickening and surrounding inflammatory changes along with bouts of diarrhea and constipation may be suggestive of possible inflammatory bowel disease.  -WBC 11.2->10.7--continue to monitor  -f/u CT abdomen/pelvis--Lesion in segment 6 of the liver is mildly hyperdense with a low density rim.  Persistent marked wall thickening of a loop of distal ileum compatible with transmural inflammation associated with inflammatory bowel disease or mass. There is also adjacent wall thickening in the sigmoid colon, possibly due to secondary inflammation or underlying mass. Indistinct mesenteric stranding is present in this vicinity. Bilateral sacroiliitis. -GI Consulted--Dr. Loreta Ave  -morphine PRN   #3: GI bleed--FOBT x1 positive. Hemoglobin 6.6 on admission. See #1 and #2  -monitor H/H Q6H--s/p transfusion 7.5 -IV protonix  -Zofran PRN  -consider colonoscopy -GI Consulted  #4: Acute Kidney Injury: Cr 1.15 on admission; possibly prerenal secondary to dehydration. Has not eaten or drank fluids today.  -f/u urine sodium and Cr--FeNA: 0.06 -cr: 0.90 today  -IVF  -F/U bmet   #5: Hypertension: self reported hx of hypertension many years ago but was never on medication.  BP on admission 154/83 -continue to monitor for now given acute blood loss and s/p transfusion -hydralazine  10mg  IV Q4H PRN SBP>180 DBP>100  Diet: regular diet DVT Px: SCDs  Dispo: pending clinical improvement   LOS: 1 day   Darden Palmer 02/17/2012, 2:20 PM

## 2012-02-17 NOTE — Progress Notes (Signed)
Pts BP 175/ 82. MD on call notified. No new orders given.

## 2012-02-17 NOTE — Progress Notes (Signed)
UR COMPLETED  

## 2012-02-17 NOTE — Consult Note (Signed)
Reason for Consult: Abnormal CT scan of the abdomen. Referring Physician: [facility]-Dr. Eben Burow.   [name] is an 56 y.o. female.  HPI: Patient gives a history of being dizzy at work-she claims this is what prompted her to come to the hospital. Found to have a hemoglobin of 6.6 gm/dl on admission. She denies having any abdominal pain at the time of admission or at the present time. She gives a history of constipation about 2 months ago. She was treated for complicated diverticulitis in September. She denies having any nausea, vomiting, dysphagia, odynophagia or abnormal weight loss.   Past Medical History  Diagnosis Date  . Hypertension     pt states she stopped taking meds while in her 20's   Past Surgical History  Procedure Date  . Abdominal hysterectomy    No family history on file.  Social History:  reports that she has never smoked. She has never used smokeless tobacco. She reports that she drinks alcohol. She reports that she does not use illicit drugs.  Allergies: No Known Allergies  Medications: I have reviewed the patient's current medications.  Results for orders placed during the hospital encounter of 02/16/12 (from the past 48 hour(s))  CBC WITH DIFFERENTIAL     Status: Abnormal   Collection Time   02/16/12  2:33 PM      Component Value Range Comment   WBC 11.2 (*) 4.0 - 10.5 K/uL    RBC 2.75 (*) 3.87 - 5.11 MIL/uL    Hemoglobin 6.6 (*) 12.0 - 15.0 g/dL    HCT 16.1 (*) 09.6 - 46.0 %    MCV 76.4 (*) 78.0 - 100.0 fL    MCH 24.0 (*) 26.0 - 34.0 pg    MCHC 31.4  30.0 - 36.0 g/dL    RDW 04.5 (*) 40.9 - 15.5 %    Platelets 336  150 - 400 K/uL    Neutrophils Relative 83 (*) 43 - 77 %    Neutro Abs 9.3 (*) 1.7 - 7.7 K/uL    Lymphocytes Relative 12  12 - 46 %    Lymphs Abs 1.4  0.7 - 4.0 K/uL    Monocytes Relative 4  3 - 12 %    Monocytes Absolute 0.4  0.1 - 1.0 K/uL    Eosinophils Relative 0  0 - 5 %    Eosinophils Absolute 0.0  0.0 - 0.7 K/uL    Basophils Relative 0  0  - 1 %    Basophils Absolute 0.0  0.0 - 0.1 K/uL   COMPREHENSIVE METABOLIC PANEL     Status: Abnormal   Collection Time   02/16/12  2:33 PM      Component Value Range Comment   Sodium 137  135 - 145 mEq/L    Potassium 4.0  3.5 - 5.1 mEq/L    Chloride 102  96 - 112 mEq/L    CO2 23  19 - 32 mEq/L    Glucose, Bld 99  70 - 99 mg/dL    BUN 16  6 - 23 mg/dL    Creatinine, Ser 8.11 (*) 0.50 - 1.10 mg/dL    Calcium 9.7  8.4 - 91.4 mg/dL    Total Protein 8.2  6.0 - 8.3 g/dL    Albumin 3.8  3.5 - 5.2 g/dL    AST 14  0 - 37 U/L    ALT 9  0 - 35 U/L    Alkaline Phosphatase 77  39 - 117 U/L  Total Bilirubin 0.2 (*) 0.3 - 1.2 mg/dL    GFR calc non Af Amer 52 (*) >90 mL/min    GFR calc Af Amer 60 (*) >90 mL/min   TYPE AND SCREEN     Status: Normal   Collection Time   02/16/12  3:32 PM      Component Value Range Comment   ABO/RH(D) A POS      Antibody Screen NEG      Sample Expiration 02/19/2012      PT AG Type NEGATIVE FOR LEWIS A ANTIGEN      Unit Number [id number]      Blood Component Type RED CELLS,LR      Unit division 00      Status of Unit ISSUED,FINAL      Transfusion Status OK TO TRANSFUSE      Crossmatch Result Compatible      Unit Number [id number]      Blood Component Type RED CELLS,LR      Unit division 00      Status of Unit ISSUED,FINAL      Transfusion Status OK TO TRANSFUSE      Crossmatch Result Compatible     PREPARE RBC (CROSSMATCH)     Status: Normal   Collection Time   02/16/12  3:32 PM      Component Value Range Comment   Order Confirmation ORDER PROCESSED BY BLOOD BANK     ABO/RH     Status: Normal   Collection Time   02/16/12  4:18 PM      Component Value Range Comment   ABO/RH(D) A POS     OCCULT BLOOD, POC DEVICE     Status: Normal   Collection Time   02/16/12  5:32 PM      Component Value Range Comment   Fecal Occult Bld NEGATIVE     HEMOGLOBIN A1C     Status: Abnormal   Collection Time   02/16/12  5:44 PM      Component Value Range  Comment   Hemoglobin A1C 5.9 (*) <5.7 %    Mean Plasma Glucose 123 (*) <117 mg/dL   TROPONIN I     Status: Normal   Collection Time   02/16/12  5:44 PM      Component Value Range Comment   Troponin I <0.30  <0.30 ng/mL   TSH     Status: Normal   Collection Time   02/16/12  5:44 PM      Component Value Range Comment   TSH 1.218  0.350 - 4.500 uIU/mL   LIPID PANEL     Status: Abnormal   Collection Time   02/16/12  5:44 PM      Component Value Range Comment   Cholesterol 168  0 - 200 mg/dL    Triglycerides 42  <413 mg/dL    HDL 55  >24 mg/dL    Total CHOL/HDL Ratio 3.1      VLDL 8  0 - 40 mg/dL    LDL Cholesterol 401 (*) 0 - 99 mg/dL   HIV ANTIBODY (ROUTINE TESTING)     Status: Normal   Collection Time   02/16/12  5:44 PM      Component Value Range Comment   HIV NON REACTIVE  NON REACTIVE   VITAMIN B12     Status: Normal   Collection Time   02/16/12  5:44 PM      Component Value Range Comment   Vitamin B-12 467  211 -  911 pg/mL   FOLATE     Status: Normal   Collection Time   02/16/12  5:44 PM      Component Value Range Comment   Folate 16.7     IRON AND TIBC     Status: Abnormal   Collection Time   02/16/12  5:44 PM      Component Value Range Comment   Iron <10 (*) 42 - 135 ug/dL    TIBC Not calculated due to Iron <10.  250 - 470 ug/dL    Saturation Ratios Not calculated due to Iron <10.  20 - 55 %    UIBC 339  125 - 400 ug/dL   FERRITIN     Status: Abnormal   Collection Time   02/16/12  5:44 PM      Component Value Range Comment   Ferritin 5 (*) 10 - 291 ng/mL   RETICULOCYTES     Status: Abnormal   Collection Time   02/16/12  5:44 PM      Component Value Range Comment   Retic Ct Pct 2.2  0.4 - 3.1 %    RBC. 2.62 (*) 3.87 - 5.11 MIL/uL    Retic Count, Manual 57.6  19.0 - 186.0 K/uL   PROTIME-INR     Status: Normal   Collection Time   02/16/12  5:44 PM      Component Value Range Comment   Prothrombin Time 14.3  11.6 - 15.2 seconds    INR 1.13  0.00 - 1.49     APTT     Status: Normal   Collection Time   02/16/12  5:44 PM      Component Value Range Comment   aPTT 27  24 - 37 seconds   OCCULT BLOOD, POC DEVICE     Status: Normal   Collection Time   02/16/12  6:30 PM      Component Value Range Comment   Fecal Occult Bld POSITIVE     MRSA PCR SCREENING     Status: Normal   Collection Time   02/16/12 11:18 PM      Component Value Range Comment   MRSA by PCR NEGATIVE  NEGATIVE   CBC     Status: Abnormal   Collection Time   02/17/12  1:39 AM      Component Value Range Comment   WBC 10.7 (*) 4.0 - 10.5 K/uL    RBC 3.12 (*) 3.87 - 5.11 MIL/uL    Hemoglobin 7.9 (*) 12.0 - 15.0 g/dL    HCT 16.1 (*) 09.6 - 46.0 %    MCV 78.2  78.0 - 100.0 fL    MCH 25.3 (*) 26.0 - 34.0 pg    MCHC 32.4  30.0 - 36.0 g/dL    RDW 04.5 (*) 40.9 - 15.5 %    Platelets 220  150 - 400 K/uL DELTA CHECK NOTED  BASIC METABOLIC PANEL     Status: Abnormal   Collection Time   02/17/12  1:39 AM      Component Value Range Comment   Sodium 136  135 - 145 mEq/L    Potassium 4.2  3.5 - 5.1 mEq/L    Chloride 105  96 - 112 mEq/L    CO2 19  19 - 32 mEq/L    Glucose, Bld 86  70 - 99 mg/dL    BUN 12  6 - 23 mg/dL    Creatinine, Ser 8.11  0.50 - 1.10 mg/dL    Calcium  9.1  8.4 - 10.5 mg/dL    GFR calc non Af Amer 70 (*) >90 mL/min    GFR calc Af Amer 81 (*) >90 mL/min   URINALYSIS, ROUTINE W REFLEX MICROSCOPIC     Status: Normal   Collection Time   02/17/12  3:12 AM      Component Value Range Comment   Color, Urine YELLOW  YELLOW    APPearance CLEAR  CLEAR    Specific Gravity, Urine 1.019  1.005 - 1.030    pH 5.5  5.0 - 8.0    Glucose, UA NEGATIVE  NEGATIVE mg/dL    Hgb urine dipstick NEGATIVE  NEGATIVE    Bilirubin Urine NEGATIVE  NEGATIVE    Ketones, ur NEGATIVE  NEGATIVE mg/dL    Protein, ur NEGATIVE  NEGATIVE mg/dL    Urobilinogen, UA 0.2  0.0 - 1.0 mg/dL    Nitrite NEGATIVE  NEGATIVE    Leukocytes, UA NEGATIVE  NEGATIVE MICROSCOPIC NOT DONE ON URINES WITH NEGATIVE  PROTEIN, BLOOD, LEUKOCYTES, NITRITE, OR GLUCOSE <1000 mg/dL.  SODIUM, URINE, RANDOM     Status: Normal   Collection Time   02/17/12  3:12 AM      Component Value Range Comment   Sodium, Ur 61     CREATININE, URINE, RANDOM     Status: Normal   Collection Time   02/17/12  3:12 AM      Component Value Range Comment   Creatinine, Urine 55.18     CBC     Status: Abnormal   Collection Time   02/17/12 11:00 AM      Component Value Range Comment   WBC 9.3  4.0 - 10.5 K/uL    RBC 2.97 (*) 3.87 - 5.11 MIL/uL    Hemoglobin 7.5 (*) 12.0 - 15.0 g/dL    HCT 40.9 (*) 81.1 - 46.0 %    MCV 78.1  78.0 - 100.0 fL    MCH 25.3 (*) 26.0 - 34.0 pg    MCHC 32.3  30.0 - 36.0 g/dL    RDW 91.4 (*) 78.2 - 15.5 %    Platelets 283  150 - 400 K/uL   CBC     Status: Abnormal   Collection Time   02/17/12  5:17 PM      Component Value Range Comment   WBC 10.0  4.0 - 10.5 K/uL    RBC 3.17 (*) 3.87 - 5.11 MIL/uL    Hemoglobin 7.8 (*) 12.0 - 15.0 g/dL    HCT 95.6 (*) 21.3 - 46.0 %    MCV 77.9 (*) 78.0 - 100.0 fL    MCH 24.6 (*) 26.0 - 34.0 pg    MCHC 31.6  30.0 - 36.0 g/dL    RDW 08.6 (*) 57.8 - 15.5 %    Platelets 288  150 - 400 K/uL    Ct Abdomen Pelvis W Contrast  02/16/2012  *RADIOLOGY REPORT*  Clinical Data: Abdomen pain, diarrhea, hx SBO and bowel abscess  CT ABDOMEN AND PELVIS WITH CONTRAST  Technique:  Multidetector CT imaging of the abdomen and pelvis was performed following the standard protocol during bolus administration of intravenous contrast.  Contrast: OMNIPAQUE IOHEXOL 300 MG/ML  SOLN  Comparison: 01/07/2012  Findings: Minimal scarring noted in the posterior basal segment left lower lobe.  Increased conspicuity of a lesion in segment 6 of the liver noted, measuring 4.5 x 3.1 cm with an approximately 1.1 cm low density periphery and a 2.3 cm central portion of higher  density than the liver.  The central portion remains high in density on delayed images.  No additional liver lesions observed.   Small accessory spleen noted.  No perisplenic ascites.  Pancreas unremarkable.  Adrenal glands unremarkable.  The gallbladder and biliary system appear unremarkable.  Small retroperitoneal lymph nodes are not pathologically enlarged by size criteria.  Retroaortic left renal vein noted.  Orally administered contrast extends to the cecum.  Abnormal thickened loop of distal ileum is again observed with poor definition of the bowel wall and adjacent prominent mesenteric stranding on image 60 of series 2.  No extraluminal oral contrast medium.  The proximal segment of the appendix appears unremarkable, with the distal portion extending towards the mesenteric edema and somewhat obscured.  There is also wall thickening of the sigmoid colon adjacent to the thickened loop of ileum.  I do not observe a drainable abscess.  Urinary bladder unremarkable.  Mild sclerosis in the left pubic body noted, and there is sacroiliac joint sclerosis suggesting bilateral sacroiliitis.  Mildly exaggerated lumbar lordosis noted with facet arthropathy. Uterus absent.  IMPRESSION:  1.  Lesion in segment 6 of the liver is mildly hyperdense with a low density rim.  Differential diagnostic considerations include hepatic adenoma, inflammatory pseudotumor/infection, hepatocellular carcinoma, hypervascular metastatic lesion, and peripheral cholangiocarcinoma.  MRI with without contrast using dynamic liver protocol can sometimes help in differentiation.  Biopsy may be warranted. 2.  Persistent marked wall thickening of a loop of distal ileum, with attenuation of the lumen but no obstruction, compatible with transmural inflammation associated with inflammatory bowel disease or mass.  There is also adjacent wall thickening in the sigmoid colon, possibly due to secondary inflammation or underlying mass. Indistinct mesenteric stranding is present in this vicinity.  No drainable abscess observed. 3.  Bilateral sacroiliitis.   Original Report Authenticated  By: Dellia Cloud, M.D.    Review of Systems  Constitutional: Positive for malaise/fatigue. Negative for fever, chills, weight loss and diaphoresis.  HENT: Negative.   Eyes: Negative.   Respiratory: Negative.   Cardiovascular: Negative.   Gastrointestinal: Positive for abdominal pain and constipation. Negative for heartburn, nausea, vomiting, blood in stool and melena.  Genitourinary: Negative.   Musculoskeletal: Negative.   Skin: Negative.   Neurological: Positive for weakness.  Psychiatric/Behavioral: Negative.    Blood pressure 153/84, pulse 77, temperature 98.6 F (37 C), temperature source Oral, resp. rate 19, height 5\' 2"  (1.575 m), weight 112.2 kg (247 lb 5.7 oz), SpO2 100.00%. Physical Exam  Constitutional: She is oriented to person, place, and time. She appears well-developed and well-nourished.  HENT:  Head: Normocephalic and atraumatic.  Eyes: Conjunctivae normal and EOM are normal. Pupils are equal, round, and reactive to light.  Neck: Normal range of motion. Neck supple.  Cardiovascular: Normal rate and regular rhythm.   Respiratory: Effort normal and breath sounds normal.  GI: Soft. Bowel sounds are normal. She exhibits no distension and no mass. There is no tenderness. There is no rebound and no guarding.  Musculoskeletal: Normal range of motion.  Neurological: She is alert and oriented to person, place, and time. She has normal reflexes.  Skin: Skin is warm and dry.  Psychiatric: She has a normal mood and affect. Her behavior is normal. Judgment and thought content normal.    Assessment/Plan: 1) Severe iron deficiency anemia with abnormal thickening of the sigmoid colon and adjacent loops of small bowel and a liver lesion: WIll plan to do a colonoscopy tomorrow. Will prep tonight. Change patient's diet  to clear liquids and try to make a tissue diagnosis.  [name] 02/17/2012, 6:08 PM

## 2012-02-18 ENCOUNTER — Encounter (HOSPITAL_COMMUNITY): Payer: Self-pay

## 2012-02-18 ENCOUNTER — Encounter (HOSPITAL_COMMUNITY)
Admission: EM | Disposition: A | Discharge status: Home / Self Care | Payer: Self-pay | Source: Home / Self Care | Attending: Internal Medicine

## 2012-02-18 DIAGNOSIS — K769 Liver disease, unspecified: Secondary | ICD-10-CM | POA: Diagnosis present

## 2012-02-18 DIAGNOSIS — R1900 Intra-abdominal and pelvic swelling, mass and lump, unspecified site: Secondary | ICD-10-CM

## 2012-02-18 HISTORY — PX: COLONOSCOPY: SHX5424

## 2012-02-18 LAB — CBC
HCT: 26.6 % — ABNORMAL LOW (ref 36.0–46.0)
HCT: 25.8 % — ABNORMAL LOW (ref 36.0–46.0)
Hemoglobin: 8.3 g/dL — ABNORMAL LOW (ref 12.0–15.0)
Hemoglobin: 8.6 g/dL — ABNORMAL LOW (ref 12.0–15.0)
Hemoglobin: 8.3 g/dL — ABNORMAL LOW (ref 12.0–15.0)
MCH: 25.5 pg — ABNORMAL LOW (ref 26.0–34.0)
MCHC: 32.3 g/dL (ref 30.0–36.0)
MCHC: 32.2 g/dL (ref 30.0–36.0)
MCV: 78.4 fL (ref 78.0–100.0)
Platelets: 270 10*3/uL (ref 150–400)
RBC: 3.29 MIL/uL — ABNORMAL LOW (ref 3.87–5.11)
RBC: 3.37 MIL/uL — ABNORMAL LOW (ref 3.87–5.11)
RDW: 17 % — ABNORMAL HIGH (ref 11.5–15.5)
WBC: 10.2 10*3/uL (ref 4.0–10.5)

## 2012-02-18 SURGERY — COLONOSCOPY
Anesthesia: Moderate Sedation

## 2012-02-18 MED ORDER — FENTANYL CITRATE 0.05 MG/ML IJ SOLN
INTRAMUSCULAR | Status: DC | PRN
  Administered 2012-02-18 (×3): 25 ug via INTRAVENOUS

## 2012-02-18 MED ORDER — MIDAZOLAM HCL 5 MG/5ML IJ SOLN
INTRAMUSCULAR | Status: DC | PRN
  Administered 2012-02-18 (×2): 1 mg via INTRAVENOUS
  Administered 2012-02-18 (×3): 2 mg via INTRAVENOUS

## 2012-02-18 MED ORDER — FENTANYL CITRATE 0.05 MG/ML IJ SOLN
INTRAMUSCULAR | Status: AC
  Filled 2012-02-18: qty 2

## 2012-02-18 MED ORDER — MIDAZOLAM HCL 5 MG/ML IJ SOLN
INTRAMUSCULAR | Status: AC
  Filled 2012-02-18: qty 2

## 2012-02-18 MED ORDER — DEXTROSE 5 % IV SOLN
2.0000 g | INTRAVENOUS | Status: DC
  Administered 2012-02-19 – 2012-02-23 (×5): 2 g via INTRAVENOUS
  Filled 2012-02-18 (×8): qty 2

## 2012-02-18 MED ORDER — SPOT INK MARKER SYRINGE KIT
PACK | SUBMUCOSAL | Status: AC
  Filled 2012-02-18: qty 5

## 2012-02-18 NOTE — Progress Notes (Signed)
While talking one-on-one with patient, she began crying and said that her situation was hopeless. I asked patient if she wanted to harm herself and she stated "I probably will." When asked if she had a plan, she stated "I haven't gotten that far yet." She did state that she wanted to speak to her doctor ASAP regarding her treatment options and expected outcome.

## 2012-02-18 NOTE — Progress Notes (Signed)
Patient ordered Rocephin IV and Flagyl PO. Doses scheduled while patient off the floor for procedure. Attempted to administer and patient refused stating "It isn't going to do any good."

## 2012-02-18 NOTE — Op Note (Signed)
[facility] [address] [place], [address]   OPERATIVE PROCEDURE REPORT  PATIENT: Linda, [name]  MR#: [id number] BIRTHDATE: June 14, 1955  GENDER: Female ENDOSCOPIST: Jeani Hawking, MD ASSISTANT:   Olene Craven, technician Oletha Blend, technician Anthony Sar, RN PROCEDURE DATE: 02/18/2012 PROCEDURE:   Colonoscopy with snare polypectomy and Colonoscopy with biopsy ASA CLASS:   Class III INDICATIONS:an abnormal imaging results. MEDICATIONS: Versed 10 mg IV and Fentanyl 100 mcg IV  DESCRIPTION OF PROCEDURE:   After the risks benefits and alternatives of the procedure were thoroughly explained, informed consent was obtained.        The Pentax Colonoscope [id number] endoscope was introduced through the anus  and advanced to the terminal ileum which was intubated for a short distance , No adverse events experienced.    The quality of the prep was good. . The instrument was then slowly withdrawn as the colon was fully examined.    FINDINGS: Afrom 15-20 cm a 5 cm malignant stricture was encountered. The adult colonoscope was not able to pass through and then a pediatric colonoscope was utilized.  With gentle manuvering the colonoscope was able to pass through the malignant stricture.  The TI was intubated and there was no evidence of any inflammation as noted on the CT scan.  A large 1.5-2 cm hepatic flexure polyp was identified.  a total of 5 ml of normal saline was injected into the base, which lifted the lesion.  The polyp was removed in one pass with a hot snare.  Unfortunateloy the polyp was too large to suction and it was resected into multiple smaller segments.  Left sided diverticula were identified.  During the withdrawal of the colonoscope, the malignant stricture was encountered again. Multiple cold biopsies were obtained and the proximal and distal edges were tattooed with SPOT.  No other abnormalities were identified.  The prep was  very good given the high grade malignant stricture.   Retroflexed views revealed internal/external hemorrhoids.     The scope was then withdrawn from the patient and the procedure terminated.  COMPLICATIONS: There were no complications.  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 2) Hepatic flexure polyp. 3) Left sided diverticula.  RECOMMENDATIONS: 1) Await biopsy results. 2) Clear liquid diet. 3) Surgical consultation. 4) Check CEA level.   _______________________________ eSigned:  Jeani Hawking, MD 02/18/2012 4:02 PM   PATIENT NAME:  Linda, [name] MR#: [id number]

## 2012-02-18 NOTE — H&P (View-Only) (Signed)
Internal Medicine Teaching Service Attending Note Date: 02/18/2012  Patient name: [name]  Medical record number: [id number]  Date of birth: 10/26/1955    This patient has been seen and discussed with the house staff. Please see their note for complete details. I concur with their findings with the following additions/corrections: patient feels well overall. Has had BM without any blood. She will get a colonoscopy this evening. BP 169/85  Pulse 70  Temp 98.3 F (36.8 C) (Oral)  Resp 18  Ht 5' 2" (1.575 m)  Wt 247 lb 5.7 oz (112.2 kg)  BMI 45.24 kg/m2  SpO2 99% Physical exam is unchanged. Plan: - Colonoscopy today to establish tissue diagnosis - Continue current antibiotics for GNR - Monitor CBC Q12 and transfuse if Hbg<7 - MRI liver to delineate the mass pending GI recommendations/colonoscopy - Symptomatic management of pain  Rest of the management per residents.   Kaison Mcparland 02/18/2012, 1:39 PM Pager [phone]  

## 2012-02-18 NOTE — Progress Notes (Addendum)
Subjective: Ms. Sunderlin was seen and examined at bedside today.  She claims to be feeling good today, less tired, and no abdominal pain.  She was transfused 1 units PRBCs last night and Hb 8.3 today.  She was seen by GI yesterday and is scheduled for colonoscopy today.  She is a little nervous about her colonoscopy and anesthesia.  I have gone over the procedure in detail and she claims to feel more comfortable.  Overnight, she complained of mild cough and congestion, was given guaifenesin which resolved her complaints. She denies any fever, chills, N/V/D, abdominal pain, chest pain, or shortness of breath at this time.    Objective: Vital signs in last 24 hours: Filed Vitals:   02/18/12 0114 02/18/12 0200 02/18/12 0300 02/18/12 0401  BP: 162/82 163/82 148/74 151/79  Pulse: 80 77  72  Temp: 98.5 F (36.9 C) 98.3 F (36.8 C) 98.1 F (36.7 C) 98.3 F (36.8 C)  TempSrc: Oral Oral  Oral  Resp: 20 18 20 16   Height:      Weight:      SpO2:    98%   Weight change:   Intake/Output Summary (Last 24 hours) at 02/18/12 1022 Last data filed at 02/18/12 0917  Gross per 24 hour  Intake  312.5 ml  Output      0 ml  Net  312.5 ml   Vitals reviewed. General: sitting up in bed, NAD HEENT: PERRLA, EOMI, no scleral icterus Cardiac: RRR,+ systolic murmur 2-3/6 loudest at right sternal border Pulm: clear to auscultation bilaterally, no wheezes, rales, or rhonchi Abd: soft, obese, nontender, nondistended, BS present, stretch marks Ext: warm and well perfused, no pedal edema, +2dp b/l Neuro: alert and oriented X3, cranial nerves II-XII grossly intact, strength and sensation to light touch equal in bilateral upper and lower extremities  Lab Results: Basic Metabolic Panel:  Lab 02/17/12 1610 02/16/12 1433  NA 136 137  K 4.2 4.0  CL 105 102  CO2 19 23  GLUCOSE 86 99  BUN 12 16  CREATININE 0.90 1.15*  CALCIUM 9.1 9.7  MG -- --  PHOS -- --   Liver Function Tests:  Lab 02/16/12 1433  AST 14    ALT 9  ALKPHOS 77  BILITOT 0.2*  PROT 8.2  ALBUMIN 3.8   CBC:  Lab 02/18/12 0605 02/17/12 2237 02/16/12 1433  WBC 10.2 9.9 --  NEUTROABS -- -- 9.3*  HGB 8.3* 7.5* --  HCT 25.9* 23.8* --  MCV 78.7 78.0 --  PLT 270 266 --   Cardiac Enzymes:  Lab 02/16/12 1744  CKTOTAL --  CKMB --  CKMBINDEX --  TROPONINI <0.30   Hemoglobin A1C:  Lab 02/16/12 1744  HGBA1C 5.9*   Fasting Lipid Panel:  Lab 02/16/12 1744  CHOL 168  HDL 55  LDLCALC 105*  TRIG 42  CHOLHDL 3.1  LDLDIRECT --   Thyroid Function Tests:  Lab 02/16/12 1744  TSH 1.218  T4TOTAL --  FREET4 --  T3FREE --  THYROIDAB --   Coagulation:  Lab 02/16/12 1744  LABPROT 14.3  INR 1.13   Anemia Panel:  Lab 02/16/12 1744  VITAMINB12 467  FOLATE 16.7  FERRITIN 5*  TIBC Not calculated due to Iron <10.  IRON <10*  RETICCTPCT 2.2   Urinalysis:  Lab 02/17/12 0312  COLORURINE YELLOW  LABSPEC 1.019  PHURINE 5.5  GLUCOSEU NEGATIVE  HGBUR NEGATIVE  BILIRUBINUR NEGATIVE  KETONESUR NEGATIVE  PROTEINUR NEGATIVE  UROBILINOGEN 0.2  NITRITE NEGATIVE  LEUKOCYTESUR NEGATIVE   Micro Results: Recent Results (from the past 240 hour(s))  MRSA PCR SCREENING     Status: Normal   Collection Time   02/16/12 11:18 PM      Component Value Range Status Comment   MRSA by PCR NEGATIVE  NEGATIVE Final   CULTURE, BLOOD (ROUTINE X 2)     Status: Normal (Preliminary result)   Collection Time   02/17/12  9:43 AM      Component Value Range Status Comment   Specimen Description BLOOD RIGHT HAND   Final    Special Requests BOTTLES DRAWN AEROBIC AND ANAEROBIC 10CC   Final    Culture  Setup Time 02/17/2012 16:28   Final    Culture     Final    Value:        BLOOD CULTURE RECEIVED NO GROWTH TO DATE CULTURE WILL BE HELD FOR 5 DAYS BEFORE ISSUING A FINAL NEGATIVE REPORT   Report Status PENDING   Incomplete   CULTURE, BLOOD (ROUTINE X 2)     Status: Normal (Preliminary result)   Collection Time   02/17/12 10:34 AM       Component Value Range Status Comment   Specimen Description BLOOD RIGHT ARM   Final    Special Requests BOTTLES DRAWN AEROBIC AND ANAEROBIC 10CC   Final    Culture  Setup Time 02/17/2012 16:27   Final    Culture     Final    Value: GRAM NEGATIVE RODS     Note: Gram Stain Report Called to,Read Back By and Verified With: AERIELLE MOHAMMED @0545  ON 02/18/2012 BY MCLET   Report Status PENDING   Incomplete    Studies/Results: Ct Abdomen Pelvis W Contrast  02/16/2012  *RADIOLOGY REPORT*  Clinical Data: Abdomen pain, diarrhea, hx SBO and bowel abscess  CT ABDOMEN AND PELVIS WITH CONTRAST  Technique:  Multidetector CT imaging of the abdomen and pelvis was performed following the standard protocol during bolus administration of intravenous contrast.  Contrast: OMNIPAQUE IOHEXOL 300 MG/ML  SOLN  Comparison: 01/07/2012  Findings: Minimal scarring noted in the posterior basal segment left lower lobe.  Increased conspicuity of a lesion in segment 6 of the liver noted, measuring 4.5 x 3.1 cm with an approximately 1.1 cm low density periphery and a 2.3 cm central portion of higher density than the liver.  The central portion remains high in density on delayed images.  No additional liver lesions observed.  Small accessory spleen noted.  No perisplenic ascites.  Pancreas unremarkable.  Adrenal glands unremarkable.  The gallbladder and biliary system appear unremarkable.  Small retroperitoneal lymph nodes are not pathologically enlarged by size criteria.  Retroaortic left renal vein noted.  Orally administered contrast extends to the cecum.  Abnormal thickened loop of distal ileum is again observed with poor definition of the bowel wall and adjacent prominent mesenteric stranding on image 60 of series 2.  No extraluminal oral contrast medium.  The proximal segment of the appendix appears unremarkable, with the distal portion extending towards the mesenteric edema and somewhat obscured.  There is also wall  thickening of the sigmoid colon adjacent to the thickened loop of ileum.  I do not observe a drainable abscess.  Urinary bladder unremarkable.  Mild sclerosis in the left pubic body noted, and there is sacroiliac joint sclerosis suggesting bilateral sacroiliitis.  Mildly exaggerated lumbar lordosis noted with facet arthropathy. Uterus absent.  IMPRESSION:  1.  Lesion in segment 6 of the liver is mildly  hyperdense with a low density rim.  Differential diagnostic considerations include hepatic adenoma, inflammatory pseudotumor/infection, hepatocellular carcinoma, hypervascular metastatic lesion, and peripheral cholangiocarcinoma.  MRI with without contrast using dynamic liver protocol can sometimes help in differentiation.  Biopsy may be warranted. 2.  Persistent marked wall thickening of a loop of distal ileum, with attenuation of the lumen but no obstruction, compatible with transmural inflammation associated with inflammatory bowel disease or mass.  There is also adjacent wall thickening in the sigmoid colon, possibly due to secondary inflammation or underlying mass. Indistinct mesenteric stranding is present in this vicinity.  No drainable abscess observed. 3.  Bilateral sacroiliitis.   Original Report Authenticated By: Dellia Cloud, M.D.    Medications: I have reviewed the patient's current medications. Scheduled Meds:    . ciprofloxacin  500 mg Oral BID  . metroNIDAZOLE  500 mg Oral Q8H  . pantoprazole (PROTONIX) IV  40 mg Intravenous Q12H  . polyethylene glycol-electrolytes  4,000 mL Oral Once   Continuous Infusions:    . sodium chloride 150 mL/hr at 02/17/12 1200   PRN Meds:.guaiFENesin, morphine injection, ondansetron (ZOFRAN) IV, ondansetron Assessment/Plan:  Ms. Athens is a pleasant 56 year old African American female with past medical history of hypertension and diverticular abscess, partial small bowel obstruction, and possible microperforation (September 2013 treated with course  of Cipro and Flagyl) admitted for acute blood loss anemia (Hb on admission 6.6), blood in stool, and abdominal pain. CT A/P Lesion in segment 6 of the liver is mildly hyperdense with a low density rim.  Persistent marked wall thickening of a loop of distal ileum compatible with transmural inflammation associated with inflammatory bowel disease or mass. There is also adjacent wall thickening in the sigmoid colon, possibly due to secondary inflammation or underlying mass. Indistinct mesenteric stranding is present in this vicinity. Bilateral sacroiliitis.  #1: Acute blood loss anemia--Hb 6.6 on admission, down from 9.4 and September 2013. FOBT positive. Likely secondary to GI bleed as Ms. Schedler reports pink/bright red blood in stool for approximately one month. Other causes of blood loss may also be perforation of diverticular abscess or possible ulcer vs diverticulitis vs IBD--crohn's or ulcerative colitis. Also complains of fatigue and occasional dizziness x 4 months.  - Transfer to med/surg  - Transfused 3 units packed red blood cells since admission - Followup EKG--NSR 77bpm  - monitor H/H 6H--Hb this AM 8.3 - IVF  - f/u TSH--1.218  #2: Abdominal Pain and Diarrhea--reports long-term history of crampy abdominal pain with episodes of constipation and diarrhea. Abdominal pain resolved since admission.  Continues to have loose BM.  History of partial small bowel obstruction and diverticular abscess. Possibly secondary to mass vs. IBS or IBD vs. diverticular abscess vs perforation vs diverticulitis or diverticulosis. Given CT results showing persistent marked wall thickening of a loop of distal ileum compatible with transmural inflammation associated with inflammatory bowel disease or mass. There is also adjacent wall thickening in the sigmoid colon along with a lesion in segment 6 of the liver is mildly hyperdense with a low density rim and b/l sacroilitis.  -WBC 11.2->10.2--continue to monitor  -GI  following--Dr. Mann--will proceed with colonoscopy today -morphine PRN -blood cx : positive for gram negative rods -on cipro 500mg  PO BID and flagyl 500mg  PO TID day #2  #3: GI bleed--FOBT x1 positive. Hemoglobin 6.6 on admission. See #1 and #2  -monitor H/H Q6H--s/p transfusion 7.5 -IV protonix  -Zofran PRN  -colonoscopy today -GI Consulted  #4: Acute Kidney Injury: Cr 1.15  on admission; possibly prerenal secondary to dehydration. Has not eaten or drank fluids today.  -f/u urine sodium and Cr--FeNA: 0.06 -cr: 0.90  -IVF  -continue to monitor  #5: Hypertension: self reported hx of hypertension many years ago but was never on medication.  BP on admission 154/83 -continue to monitor for now given acute blood loss and s/p transfusion  #6: Liver lesion: Increased in size noted by CT Abdomen from September 2013 to this admission, reviewed with radiology--Increased conspicuity of a lesion in segment 6 of the liver noted, measuring 4.5 x 3.1 cm with an approximately 1.1 cm low density periphery and a 2.3 cm central portion of higher density than the liver. The central portion remains high in density on delayed images. Unknown etiology, possibly infectious vs. Concerning for mass/metastasis? Benign vs malignant -Consider MRI pending GI recommendations/colonoscopy findings  Diet: NPO for now until colonoscopy DVT Px: SCDs  Dispo: pending clinical improvement   LOS: 2 days   Darden Palmer 02/18/2012, 10:22 AM

## 2012-02-18 NOTE — Progress Notes (Signed)
ANTIBIOTIC CONSULT NOTE - INITIAL  Pharmacy Consult for Ceftriaxone Indication: Gram-negative bacteremia  No Known Allergies  Patient Measurements: Height: 5\' 2"  (157.5 cm) Weight: 247 lb 5.7 oz (112.2 kg) IBW/kg (Calculated) : 50.1   Vital Signs: Temp: 98.3 F (36.8 C) (10/24 1216) Temp src: Oral (10/24 1216) BP: 169/85 mmHg (10/24 1216) Pulse Rate: 70  (10/24 1216) Intake/Output from previous day: 10/23 0701 - 10/24 0700 In: 762.5 [I.V.:750; Blood:12.5] Out: 0  Intake/Output from this shift: Total I/O In: 10 [IV Piggyback:10] Out: -   Labs:  Basename 02/18/12 1102 02/18/12 0605 02/17/12 2237 02/17/12 0312 02/17/12 0139 02/16/12 1433  WBC 10.2 10.2 9.9 -- -- --  HGB 8.6* 8.3* 7.5* -- -- --  PLT 262 270 266 -- -- --  LABCREA -- -- -- 55.18 -- --  CREATININE -- -- -- -- 0.90 1.15*   Estimated Creatinine Clearance: 81.5 ml/min (by C-G formula based on Cr of 0.9). No results found for this basename: VANCOTROUGH:2,VANCOPEAK:2,VANCORANDOM:2,GENTTROUGH:2,GENTPEAK:2,GENTRANDOM:2,TOBRATROUGH:2,TOBRAPEAK:2,TOBRARND:2,AMIKACINPEAK:2,AMIKACINTROU:2,AMIKACIN:2, in the last 72 hours   Microbiology: Recent Results (from the past 720 hour(s))  MRSA PCR SCREENING     Status: Normal   Collection Time   02/16/12 11:18 PM      Component Value Range Status Comment   MRSA by PCR NEGATIVE  NEGATIVE Final   CULTURE, BLOOD (ROUTINE X 2)     Status: Normal (Preliminary result)   Collection Time   02/17/12  9:43 AM      Component Value Range Status Comment   Specimen Description BLOOD RIGHT HAND   Final    Special Requests BOTTLES DRAWN AEROBIC AND ANAEROBIC 10CC   Final    Culture  Setup Time 02/17/2012 16:28   Final    Culture     Final    Value:        BLOOD CULTURE RECEIVED NO GROWTH TO DATE CULTURE WILL BE HELD FOR 5 DAYS BEFORE ISSUING A FINAL NEGATIVE REPORT   Report Status PENDING   Incomplete   CULTURE, BLOOD (ROUTINE X 2)     Status: Normal (Preliminary result)   Collection  Time   02/17/12 10:34 AM      Component Value Range Status Comment   Specimen Description BLOOD RIGHT ARM   Final    Special Requests BOTTLES DRAWN AEROBIC AND ANAEROBIC 10CC   Final    Culture  Setup Time 02/17/2012 16:27   Final    Culture     Final    Value: GRAM NEGATIVE RODS     Note: Gram Stain Report Called to,Read Back By and Verified With: AERIELLE MOHAMMED @0545  ON 02/18/2012 BY MCLET   Report Status PENDING   Incomplete     Medical History: Past Medical History  Diagnosis Date  . Hypertension     pt states she stopped taking meds while in her 20's    Medications:  Anti-infectives     Start     Dose/Rate Route Frequency Ordered Stop   02/17/12 1000   ciprofloxacin (CIPRO) tablet 500 mg  Status:  Discontinued     Comments: PLEASE GIVE AFTER BLOOD CULTURES DRAWN      500 mg Oral 2 times daily 02/17/12 0939 02/18/12 1315   02/17/12 1000   metroNIDAZOLE (FLAGYL) tablet 500 mg     Comments: PLEASE GIVE AFTER BLOOD CULTURES DRAWN      500 mg Oral 3 times per day 02/17/12 1610           Assessment: Gram-negative bacteremia:  Changing  antimicrobial coverage from Cipro to Ceftriaxone empirically based on local susceptibility patterns.  Goal of Therapy:  Eradication of infection  Plan:  Rocephin 2gm IV q24h As no dosage adjustments are anticipated pharmacy will sign off.  Thank you for the consult.  Estella Husk, Pharm.D., BCPS Clinical Pharmacist  Phone [phone] Pager [phone] 02/18/2012, 1:36 PM

## 2012-02-18 NOTE — Progress Notes (Signed)
Blood cultures in the anerobic vial are positive for gram negative rods. On call MD notified.

## 2012-02-18 NOTE — Care Management Note (Signed)
    Page 1 of 1   02/24/2012     2:32:31 PM   CARE MANAGEMENT NOTE 02/24/2012  Patient:  [name],[name]   Account Number:  [id number]  Date Initiated:  02/18/2012  Documentation initiated by:  Letha Cape  Subjective/Objective Assessment:   dx abla  admit- lives alone     Action/Plan:   Anticipated DC Date:  02/24/2012   Anticipated DC Plan:  HOME/SELF CARE  In-house referral  Clinical Social Worker      DC Planning Services  CM consult      Choice offered to / List presented to:             Status of service:  Completed, signed off Medicare Important Message given?   (If response is "NO", the following Medicare IM given date fields will be blank) Date Medicare IM given:   Date Additional Medicare IM given:    Discharge Disposition:  HOME/SELF CARE  Per UR Regulation:  Reviewed for med. necessity/level of care/duration of stay  If discussed at Long Length of Stay Meetings, dates discussed:    Comments:  02/24/12 14:30 Letha Cape RN, BSN [phone] patient for dc today, CSW gave patient [name] bus pass, informed patient that she could go to [place] to get meds for $4. Patient did not want to use the med ast on this admission because she will have to come back and have surgery, she will use the med ast then.  02/18/12 16:40 Letha Cape RN, BSN [phone] patient lives alone, patient is eligible for med ast if needed.  NCM will continue to follow for dc needs.

## 2012-02-18 NOTE — Progress Notes (Signed)
Internal Medicine Teaching Service Attending Note Date: 02/18/2012  Patient name: [name]  Medical record number: [id number]  Date of birth: 10/25/1955    This patient has been seen and discussed with the house staff. Please see their note for complete details. I concur with their findings with the following additions/corrections: patient feels well overall. Has had BM without any blood. She will get a colonoscopy this evening. BP 169/85  Pulse 70  Temp 98.3 F (36.8 C) (Oral)  Resp 18  Ht 5\' 2"  (1.575 m)  Wt 247 lb 5.7 oz (112.2 kg)  BMI 45.24 kg/m2  SpO2 99% Physical exam is unchanged. Plan: - Colonoscopy today to establish tissue diagnosis - Continue current antibiotics for GNR - Monitor CBC Q12 and transfuse if Hbg<7 - MRI liver to delineate the mass pending GI recommendations/colonoscopy - Symptomatic management of pain  Rest of the management per residents.   Lars Mage 02/18/2012, 1:39 PM Pager [phone]

## 2012-02-18 NOTE — Progress Notes (Signed)
Have been encouraging patient to drink GoLYTELY. She still has about half the bottle left to finish. Will continue to encourage pt to drink.

## 2012-02-18 NOTE — Interval H&P Note (Signed)
History and Physical Interval Note:  02/18/2012 2:47 PM  [name]  has presented today for surgery, with the diagnosis of IDA/Abnormal CT scan with liver lesion-rule out colitis  The various methods of treatment have been discussed with the patient and family. After consideration of risks, benefits and other options for treatment, the patient has consented to  Procedure(s) (LRB) with comments: COLONOSCOPY (N/A) as a surgical intervention .  The patient's history has been reviewed, patient examined, no change in status, stable for surgery.  I have reviewed the patient's chart and labs.  Questions were answered to the patient's satisfaction.     Mohmed Farver D

## 2012-02-19 ENCOUNTER — Encounter (HOSPITAL_COMMUNITY): Payer: Self-pay

## 2012-02-19 ENCOUNTER — Encounter (HOSPITAL_COMMUNITY): Payer: Self-pay | Admitting: General Surgery

## 2012-02-19 ENCOUNTER — Inpatient Hospital Stay (HOSPITAL_COMMUNITY): Payer: Medicaid Other

## 2012-02-19 DIAGNOSIS — K620 Anal polyp: Secondary | ICD-10-CM

## 2012-02-19 DIAGNOSIS — K621 Rectal polyp: Secondary | ICD-10-CM

## 2012-02-19 LAB — TYPE AND SCREEN
ABO/RH(D): A POS
PT AG Type: NEGATIVE
Unit division: 0
Unit division: 0

## 2012-02-19 LAB — CBC
HCT: 26.3 % — ABNORMAL LOW (ref 36.0–46.0)
Hemoglobin: 8.5 g/dL — ABNORMAL LOW (ref 12.0–15.0)
MCHC: 32.3 g/dL (ref 30.0–36.0)
MCV: 78.3 fL (ref 78.0–100.0)
RDW: 17.3 % — ABNORMAL HIGH (ref 11.5–15.5)
WBC: 10 10*3/uL (ref 4.0–10.5)

## 2012-02-19 LAB — CEA: CEA: 306.8 ng/mL — ABNORMAL HIGH (ref 0.0–5.0)

## 2012-02-19 MED ORDER — FENTANYL CITRATE 0.05 MG/ML IJ SOLN
INTRAMUSCULAR | Status: AC
  Filled 2012-02-19: qty 4

## 2012-02-19 MED ORDER — MIDAZOLAM HCL 2 MG/2ML IJ SOLN
INTRAMUSCULAR | Status: AC | PRN
  Administered 2012-02-19: 1 mg via INTRAVENOUS

## 2012-02-19 MED ORDER — FENTANYL CITRATE 0.05 MG/ML IJ SOLN
INTRAMUSCULAR | Status: AC
  Filled 2012-02-19: qty 2

## 2012-02-19 MED ORDER — SODIUM CHLORIDE 0.9 % IV SOLN: INTRAVENOUS | Status: DC

## 2012-02-19 MED ORDER — MIDAZOLAM HCL 2 MG/2ML IJ SOLN
INTRAMUSCULAR | Status: AC
  Filled 2012-02-19: qty 4

## 2012-02-19 MED ORDER — HYDROCODONE-ACETAMINOPHEN 5-325 MG PO TABS
1.0000 | ORAL_TABLET | ORAL | Status: DC | PRN
  Administered 2012-02-19: 1 via ORAL
  Administered 2012-02-21: 2 via ORAL
  Filled 2012-02-19: qty 2
  Filled 2012-02-19: qty 1

## 2012-02-19 MED ORDER — FENTANYL CITRATE 0.05 MG/ML IJ SOLN
INTRAMUSCULAR | Status: AC | PRN
  Administered 2012-02-19: 50 ug via INTRAVENOUS

## 2012-02-19 NOTE — Consult Note (Signed)
[name] Sep 24, 1955  [id number].   Primary Care MD: None Requesting MD: Dr. Jeani Hawking Chief Complaint/Reason for Consult: sigmoid colon stricture, likely malignant HPI: This is a 56 yo female who is otherwise relatively healthy who noticed some BRB in her stools about 4 months ago.  She saw someone at urgent care who told her she had a fissure and that was likely what the bleeding was coming from.  She also noted some increase in constipation.  Last month, she was seen in the ED and a CT scan revealed distal small bowel thickening and PSBO for which she refused admission.  She was noted to have mild diverticular disease at that time.  She did have a small liver lesion, but nothing that was too concerning at that time.  On Tuesday, while she was at work she felt weak and somewhat dizzy.  EMS was called and she was brought to the [facility].  She was admitted for anemia.  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  She also has a lesion in her liver that has increased in size since her scan in September.  She denies any nausea, current abdominal pain, night sweats, or weight loss.  We have been asked to see her for surgical intervention.  Review of Systems: Please see HPI, otherwise negative  FH: Mother, Breast Ca No GI cancers  Past Medical History  Diagnosis Date  . Hypertension     pt states she stopped taking meds while in her 20's    Past Surgical History  Procedure Date  . Abdominal hysterectomy   Lumpectomy of left breast, benign nodule  Social History:  reports that she has never smoked. She has never used smokeless tobacco. She reports that she drinks alcohol. She reports that she does not use illicit drugs.  Allergies: No Known Allergies  Medications Prior to Admission  Medication Sig Dispense Refill  . ibuprofen (ADVIL,MOTRIN) 200 MG tablet Take 200-400 mg by mouth every 6 (six) hours as needed. For pain        Blood  pressure 155/75, pulse 78, temperature 99.2 F (37.3 C), temperature source Oral, resp. rate 18, height 5\' 2"  (1.575 m), weight 247 lb 5.7 oz (112.2 kg), SpO2 100.00%. Physical Exam: General: pleasant, obese black female who is laying in bed in NAD HEENT: head is normocephalic, atraumatic.  Sclera are noninjected.  PERRL.  Ears and nose without any masses or lesions.  Mouth is pink and moist Heart: regular, rate, and rhythm.  Normal s1,s2. No obvious gallops, or rubs noted. +Murmur  Palpable radial and pedal pulses bilaterally Lungs: CTAB, no wheezes, rhonchi, or rales noted.  Respiratory effort nonlabored Abd: soft, NT, ND, +BS, no masses, hernias, or organomegaly MS: all 4 extremities are symmetrical with no cyanosis, clubbing, or edema. Skin: warm and dry with no masses, lesions, or rashes Psych: A&Ox3 with an appropriate affect.    Results for orders placed during the hospital encounter of 02/16/12 (from the past 48 hour(s))  CULTURE, BLOOD (ROUTINE X 2)     Status: Normal (Preliminary result)   Collection Time   02/17/12  9:43 AM      Component Value Range Comment   Specimen Description BLOOD RIGHT HAND      Special Requests BOTTLES DRAWN AEROBIC AND ANAEROBIC 10CC      Culture  Setup Time 02/17/2012 16:28      Culture        Value:  BLOOD CULTURE RECEIVED NO GROWTH TO DATE CULTURE WILL BE HELD FOR 5 DAYS BEFORE ISSUING A FINAL NEGATIVE REPORT   Report Status PENDING     CULTURE, BLOOD (ROUTINE X 2)     Status: Normal (Preliminary result)   Collection Time   02/17/12 10:34 AM      Component Value Range Comment   Specimen Description BLOOD RIGHT ARM      Special Requests BOTTLES DRAWN AEROBIC AND ANAEROBIC 10CC      Culture  Setup Time 02/17/2012 16:27      Culture        Value: GRAM NEGATIVE RODS     Note: Gram Stain Report Called to,Read Back By and Verified With: AERIELLE MOHAMMED @0545  ON 02/18/2012 BY MCLET   Report Status PENDING     CBC     Status: Abnormal    Collection Time   02/17/12 11:00 AM      Component Value Range Comment   WBC 9.3  4.0 - 10.5 K/uL    RBC 2.97 (*) 3.87 - 5.11 MIL/uL    Hemoglobin 7.5 (*) 12.0 - 15.0 g/dL    HCT 09.6 (*) 04.5 - 46.0 %    MCV 78.1  78.0 - 100.0 fL    MCH 25.3 (*) 26.0 - 34.0 pg    MCHC 32.3  30.0 - 36.0 g/dL    RDW 40.9 (*) 81.1 - 15.5 %    Platelets 283  150 - 400 K/uL   CBC     Status: Abnormal   Collection Time   02/17/12  5:17 PM      Component Value Range Comment   WBC 10.0  4.0 - 10.5 K/uL    RBC 3.17 (*) 3.87 - 5.11 MIL/uL    Hemoglobin 7.8 (*) 12.0 - 15.0 g/dL    HCT 91.4 (*) 78.2 - 46.0 %    MCV 77.9 (*) 78.0 - 100.0 fL    MCH 24.6 (*) 26.0 - 34.0 pg    MCHC 31.6  30.0 - 36.0 g/dL    RDW 95.6 (*) 21.3 - 15.5 %    Platelets 288  150 - 400 K/uL   CBC     Status: Abnormal   Collection Time   02/17/12 10:37 PM      Component Value Range Comment   WBC 9.9  4.0 - 10.5 K/uL    RBC 3.05 (*) 3.87 - 5.11 MIL/uL    Hemoglobin 7.5 (*) 12.0 - 15.0 g/dL    HCT 08.6 (*) 57.8 - 46.0 %    MCV 78.0  78.0 - 100.0 fL    MCH 24.6 (*) 26.0 - 34.0 pg    MCHC 31.5  30.0 - 36.0 g/dL    RDW 46.9 (*) 62.9 - 15.5 %    Platelets 266  150 - 400 K/uL   PREPARE RBC (CROSSMATCH)     Status: Normal   Collection Time   02/18/12 12:57 AM      Component Value Range Comment   Order Confirmation ORDER PROCESSED BY BLOOD BANK     CBC     Status: Abnormal   Collection Time   02/18/12  6:05 AM      Component Value Range Comment   WBC 10.2  4.0 - 10.5 K/uL    RBC 3.29 (*) 3.87 - 5.11 MIL/uL    Hemoglobin 8.3 (*) 12.0 - 15.0 g/dL    HCT 52.8 (*) 41.3 - 46.0 %    MCV 78.7  78.0 - 100.0 fL    MCH 25.2 (*) 26.0 - 34.0 pg    MCHC 32.0  30.0 - 36.0 g/dL    RDW 16.1 (*) 09.6 - 15.5 %    Platelets 270  150 - 400 K/uL   CBC     Status: Abnormal   Collection Time   02/18/12 11:02 AM      Component Value Range Comment   WBC 10.2  4.0 - 10.5 K/uL    RBC 3.37 (*) 3.87 - 5.11 MIL/uL    Hemoglobin 8.6 (*) 12.0 - 15.0  g/dL    HCT 04.5 (*) 40.9 - 46.0 %    MCV 78.9  78.0 - 100.0 fL    MCH 25.5 (*) 26.0 - 34.0 pg    MCHC 32.3  30.0 - 36.0 g/dL    RDW 81.1 (*) 91.4 - 15.5 %    Platelets 262  150 - 400 K/uL   OCCULT BLOOD X 1 CARD TO LAB, STOOL     Status: Normal   Collection Time   02/18/12  3:15 PM      Component Value Range Comment   Fecal Occult Bld POSITIVE     CEA     Status: Abnormal   Collection Time   02/18/12  6:02 PM      Component Value Range Comment   CEA 306.8 (*) 0.0 - 5.0 ng/mL   CBC     Status: Abnormal   Collection Time   02/18/12 11:15 PM      Component Value Range Comment   WBC 9.8  4.0 - 10.5 K/uL    RBC 3.29 (*) 3.87 - 5.11 MIL/uL    Hemoglobin 8.3 (*) 12.0 - 15.0 g/dL    HCT 78.2 (*) 95.6 - 46.0 %    MCV 78.4  78.0 - 100.0 fL    MCH 25.2 (*) 26.0 - 34.0 pg    MCHC 32.2  30.0 - 36.0 g/dL    RDW 21.3 (*) 08.6 - 15.5 %    Platelets 249  150 - 400 K/uL    No results found.     Assessment/Plan 1. Sigmoid colon stricture, likely malignant 2. Liver lesion 3. Anemia 4. HTN  Plan: 1. Will make the patient NPO for now and ask IR to do a liver biopsy of the liver lesion today.  We would like to determine if this is malignant as well, to see whether during her operation it may be resectable.  After this procedure, she can be placed on clear liquids, but may NOT be advanced past clear liquids until after surgery to avoid having to do another bowel prep.  I have discussed all of this with the patient and she understands.  We will tentatively plan on surgical intervention sometime early next week after all these biopsies come back.  Thank you for this consult.  Rozetta Stumpp E 02/19/2012, 8:25 AM Pager: [phone]

## 2012-02-19 NOTE — Progress Notes (Signed)
02/19/12 1100  Clinical Encounter Type  Visited With Patient  Visit Type Spiritual support  Referral From Chaplain   I visited [place]. She was referred to me by Ivar Bury and Tacey Ruiz.  I prayed with her regarding her healh.  I said we would follow up with her next week 02/22/12. Chaplain Vonzella Nipple

## 2012-02-19 NOTE — Progress Notes (Signed)
Subjective: No complaints.  Feeling well at this time.  Objective: Vital signs in last 24 hours: Temp:  [98.3 F (36.8 C)-99.2 F (37.3 C)] 99.2 F (37.3 C) (10/25 0600) Pulse Rate:  [70-103] 78  (10/25 0600) Resp:  [13-30] 18  (10/25 0600) BP: (136-184)/(49-102) 155/75 mmHg (10/25 0605) SpO2:  [98 %-100 %] 100 % (10/25 0600) Last BM Date: 02/18/12  Intake/Output from previous day: 10/24 0701 - 10/25 0700 In: 22 [P.O.:12; IV Piggyback:10] Out: -  Intake/Output this shift: Total I/O In: 5867.5 [I.V.:5867.5] Out: -   General appearance: alert and no distress GI: soft, non-tender; bowel sounds normal; no masses,  no organomegaly  Lab Results:  [facility] 02/18/12 2315 02/18/12 1102 02/18/12 0605  WBC 9.8 10.2 10.2  HGB 8.3* 8.6* 8.3*  HCT 25.8* 26.6* 25.9*  PLT 249 262 270   BMET  Basename 02/17/12 0139 02/16/12 1433  NA 136 137  K 4.2 4.0  CL 105 102  CO2 19 23  GLUCOSE 86 99  BUN 12 16  CREATININE 0.90 1.15*  CALCIUM 9.1 9.7   LFT  Basename 02/16/12 1433  PROT 8.2  ALBUMIN 3.8  AST 14  ALT 9  ALKPHOS 77  BILITOT 0.2*  BILIDIR --  IBILI --   PT/INR  Basename 02/16/12 1744  LABPROT 14.3  INR 1.13   Hepatitis Panel No results found for this basename: HEPBSAG,HCVAB,HEPAIGM,HEPBIGM in the last 72 hours C-Diff No results found for this basename: CDIFFTOX:3 in the last 72 hours Fecal Lactopherrin No results found for this basename: FECLLACTOFRN in the last 72 hours  Studies/Results: No results found.  Medications:  Scheduled:   . cefTRIAXone (ROCEPHIN)  IV  2 g Intravenous Q24H  . metroNIDAZOLE  500 mg Oral Q8H  . pantoprazole (PROTONIX) IV  40 mg Intravenous Q12H  . DISCONTD: ciprofloxacin  500 mg Oral BID   Continuous:   . 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   The patient is in better spirits today.  No complications from the colonoscopy.  CEA is elevated at  308.  Plan: 1) Await biopsy results. 2) Appreciate surgical consult.  LOS: 3 days   [name] D 02/19/2012, 9:01 AM

## 2012-02-19 NOTE — H&P (Signed)
[name] is an 56 y.o. female.   Chief Complaint: rectal bleed sigmoid lesion prob malignant abd pain; fatigue Scheduled for OR for colon next few days Liver lesion noted on CT Scheduled now for liver lesion biopsy HPI: HTN; hx remote etoh abuse per pt  Past Medical History  Diagnosis Date  . Hypertension     pt states she stopped taking meds while in her 20's    Past Surgical History  Procedure Date  . Abdominal hysterectomy   . Breast lumpectomy     left breast    History reviewed. No pertinent family history. Social History:  reports that she has never smoked. She has never used smokeless tobacco. She reports that she drinks alcohol. She reports that she does not use illicit drugs.  Allergies: No Known Allergies  Medications Prior to Admission  Medication Sig Dispense Refill  . ibuprofen (ADVIL,MOTRIN) 200 MG tablet Take 200-400 mg by mouth every 6 (six) hours as needed. For pain        Results for orders placed during the hospital encounter of 02/16/12 (from the past 48 hour(s))  CULTURE, BLOOD (ROUTINE X 2)     Status: Normal (Preliminary result)   Collection Time   02/17/12  9:43 AM      Component Value Range Comment   Specimen Description BLOOD RIGHT HAND      Special Requests BOTTLES DRAWN AEROBIC AND ANAEROBIC 10CC      Culture  Setup Time 02/17/2012 16:28      Culture        Value:        BLOOD CULTURE RECEIVED NO GROWTH TO DATE CULTURE WILL BE HELD FOR 5 DAYS BEFORE ISSUING A FINAL NEGATIVE REPORT   Report Status PENDING     CULTURE, BLOOD (ROUTINE X 2)     Status: Normal (Preliminary result)   Collection Time   02/17/12 10:34 AM      Component Value Range Comment   Specimen Description BLOOD RIGHT ARM      Special Requests BOTTLES DRAWN AEROBIC AND ANAEROBIC 10CC      Culture  Setup Time 02/17/2012 16:27      Culture        Value: GRAM NEGATIVE RODS     Note: Gram Stain Report Called to,Read Back By and Verified With: AERIELLE MOHAMMED @0545  ON  02/18/2012 BY MCLET   Report Status PENDING     CBC     Status: Abnormal   Collection Time   02/17/12 11:00 AM      Component Value Range Comment   WBC 9.3  4.0 - 10.5 K/uL    RBC 2.97 (*) 3.87 - 5.11 MIL/uL    Hemoglobin 7.5 (*) 12.0 - 15.0 g/dL    HCT 16.1 (*) 09.6 - 46.0 %    MCV 78.1  78.0 - 100.0 fL    MCH 25.3 (*) 26.0 - 34.0 pg    MCHC 32.3  30.0 - 36.0 g/dL    RDW 04.5 (*) 40.9 - 15.5 %    Platelets 283  150 - 400 K/uL   CBC     Status: Abnormal   Collection Time   02/17/12  5:17 PM      Component Value Range Comment   WBC 10.0  4.0 - 10.5 K/uL    RBC 3.17 (*) 3.87 - 5.11 MIL/uL    Hemoglobin 7.8 (*) 12.0 - 15.0 g/dL    HCT 81.1 (*) 91.4 - 46.0 %  MCV 77.9 (*) 78.0 - 100.0 fL    MCH 24.6 (*) 26.0 - 34.0 pg    MCHC 31.6  30.0 - 36.0 g/dL    RDW 16.1 (*) 09.6 - 15.5 %    Platelets 288  150 - 400 K/uL   CBC     Status: Abnormal   Collection Time   02/17/12 10:37 PM      Component Value Range Comment   WBC 9.9  4.0 - 10.5 K/uL    RBC 3.05 (*) 3.87 - 5.11 MIL/uL    Hemoglobin 7.5 (*) 12.0 - 15.0 g/dL    HCT 04.5 (*) 40.9 - 46.0 %    MCV 78.0  78.0 - 100.0 fL    MCH 24.6 (*) 26.0 - 34.0 pg    MCHC 31.5  30.0 - 36.0 g/dL    RDW 81.1 (*) 91.4 - 15.5 %    Platelets 266  150 - 400 K/uL   PREPARE RBC (CROSSMATCH)     Status: Normal   Collection Time   02/18/12 12:57 AM      Component Value Range Comment   Order Confirmation ORDER PROCESSED BY BLOOD BANK     CBC     Status: Abnormal   Collection Time   02/18/12  6:05 AM      Component Value Range Comment   WBC 10.2  4.0 - 10.5 K/uL    RBC 3.29 (*) 3.87 - 5.11 MIL/uL    Hemoglobin 8.3 (*) 12.0 - 15.0 g/dL    HCT 78.2 (*) 95.6 - 46.0 %    MCV 78.7  78.0 - 100.0 fL    MCH 25.2 (*) 26.0 - 34.0 pg    MCHC 32.0  30.0 - 36.0 g/dL    RDW 21.3 (*) 08.6 - 15.5 %    Platelets 270  150 - 400 K/uL   CBC     Status: Abnormal   Collection Time   02/18/12 11:02 AM      Component Value Range Comment   WBC 10.2  4.0 - 10.5  K/uL    RBC 3.37 (*) 3.87 - 5.11 MIL/uL    Hemoglobin 8.6 (*) 12.0 - 15.0 g/dL    HCT 57.8 (*) 46.9 - 46.0 %    MCV 78.9  78.0 - 100.0 fL    MCH 25.5 (*) 26.0 - 34.0 pg    MCHC 32.3  30.0 - 36.0 g/dL    RDW 62.9 (*) 52.8 - 15.5 %    Platelets 262  150 - 400 K/uL   OCCULT BLOOD X 1 CARD TO LAB, STOOL     Status: Normal   Collection Time   02/18/12  3:15 PM      Component Value Range Comment   Fecal Occult Bld POSITIVE     CEA     Status: Abnormal   Collection Time   02/18/12  6:02 PM      Component Value Range Comment   CEA 306.8 (*) 0.0 - 5.0 ng/mL   CBC     Status: Abnormal   Collection Time   02/18/12 11:15 PM      Component Value Range Comment   WBC 9.8  4.0 - 10.5 K/uL    RBC 3.29 (*) 3.87 - 5.11 MIL/uL    Hemoglobin 8.3 (*) 12.0 - 15.0 g/dL    HCT 41.3 (*) 24.4 - 46.0 %    MCV 78.4  78.0 - 100.0 fL    MCH 25.2 (*) 26.0 -  34.0 pg    MCHC 32.2  30.0 - 36.0 g/dL    RDW 16.1 (*) 09.6 - 15.5 %    Platelets 249  150 - 400 K/uL    No results found.  Review of Systems  Constitutional: Negative for fever and weight loss.  Respiratory: Negative for shortness of breath.   Cardiovascular: Negative for chest pain.  Gastrointestinal: Positive for nausea. Negative for vomiting and abdominal pain.  Neurological: Positive for weakness.    Blood pressure 155/75, pulse 78, temperature 99.2 F (37.3 C), temperature source Oral, resp. rate 18, height 5\' 2"  (1.575 m), weight 247 lb 5.7 oz (112.2 kg), SpO2 100.00%. Physical Exam  Constitutional: She is oriented to person, place, and time.  Cardiovascular: Normal rate and regular rhythm.   No murmur heard. Respiratory: Effort normal and breath sounds normal. She has no wheezes.  GI: Soft. Bowel sounds are normal. There is no tenderness.  Musculoskeletal: Normal range of motion.  Neurological: She is alert and oriented to person, place, and time.  Psychiatric: She has a normal mood and affect. Her behavior is normal. Judgment and  thought content normal.     Assessment/Plan Liver lesion on CT Sigmoid lesion - prob malignant For OR for colon next 24-48 hrs Scheduled for liver lesion biopsy today Pt aware of procedure benefits and risks and agreeable to proceed Consent signed and in chart  Nakari Bracknell A 02/19/2012, 9:28 AM

## 2012-02-19 NOTE — Consult Note (Signed)
Agree with A&P of KO,PA. She is comfortable now, no pain, benign abdomen. Labs > + Blood culture for neg rods - doesn't appear toxic and wbc OK. She needs liver Bx for staging. Might be possible to do liver resection with primary surgery. Wonder if she could have a fistula to the small bowel accounting for some of the inflammatory changes seen in TI on CT scans.  Discussed situation with patient and she understands the issues

## 2012-02-19 NOTE — Progress Notes (Signed)
Internal Medicine Teaching Service Attending Note Date: 02/19/2012  Patient name: [name]  Medical record number: [id number]  Date of birth: 17-Sep-1955    This patient has been seen and discussed with the house staff. Please see their note for complete details. I concur with their findings with the following additions/corrections: Patient was suicidal overnight after hearing the differential diagnosis of her mass. BP 171/81  Pulse 76  Temp 98.4 F (36.9 C) (Axillary)  Resp 16  Ht 5\' 2"  (1.575 m)  Wt 247 lb 5.7 oz (112.2 kg)  BMI 45.24 kg/m2  SpO2 100%. Ultrasound-guided liver biopsy was done today. This is a very difficult situation for her to deal. Given that patient was suicidal psychiatric was consulted. We appreciate gastroenterology and surgery for their assistance in this case. Medical management as noted in her resident's note   Lars Mage 02/19/2012, 4:15 PM

## 2012-02-19 NOTE — Progress Notes (Signed)
Subjective: After patient was given the diagnosis patient was very upset and had  suicidal ideation. Patient was placed on suicide precaution. Patient today noted that she is doing great. She noted that it was a devastating news and she was really upset. Since this am patient denies any suicidal ideation or homo suicidal ideation.  She denies any chest pain, SOB, abdominal pain or bloody stool  Objective: Vital signs in last 24 hours: Filed Vitals:   02/19/12 1200 02/19/12 1207 02/19/12 1214 02/19/12 1455  BP: 175/90 162/77 171/81   Pulse: 90 85 76 76  Temp:    98.4 F (36.9 C)  TempSrc:    Axillary  Resp: 16 15 15 16   Height:      Weight:      SpO2: 97% 99% 100% 100%   Weight change:   Intake/Output Summary (Last 24 hours) at 02/19/12 1506 Last data filed at 02/19/12 0730  Gross per 24 hour  Intake 5879.5 ml  Output      0 ml  Net 5879.5 ml   Physical Exam: Constitutional: Vital signs reviewed.  Patient is a well-developed and well-nourished  in no acute distress and cooperative with exam. Alert and oriented x3.   Neck: Supple, .  Cardiovascular: RRR, S1 normal, S2 normal, no MRG, pulses symmetric and intact bilaterally Pulmonary/Chest: CTAB, no wheezes, rales, or rhonchi Abdominal: Soft. Non-tender, non-distended, bowel sounds are normal, Neurological: A&O x3,   Lab Results: Basic Metabolic Panel:  Lab 02/17/12 1610 02/16/12 1433  NA 136 137  K 4.2 4.0  CL 105 102  CO2 19 23  GLUCOSE 86 99  BUN 12 16  CREATININE 0.90 1.15*  CALCIUM 9.1 9.7  MG -- --  PHOS -- --   Liver Function Tests:  Lab 02/16/12 1433  AST 14  ALT 9  ALKPHOS 77  BILITOT 0.2*  PROT 8.2  ALBUMIN 3.8    CBC:  Lab 02/19/12 1117 02/18/12 2315 02/16/12 1433  WBC 10.0 9.8 --  NEUTROABS -- -- 9.3*  HGB 8.5* 8.3* --  HCT 26.3* 25.8* --  MCV 78.3 78.4 --  PLT 264 249 --   Cardiac Enzymes:  Lab 02/16/12 1744  CKTOTAL --  CKMB --  CKMBINDEX --  TROPONINI <0.30   Hemoglobin  A1C:  Lab 02/16/12 1744  HGBA1C 5.9*   Fasting Lipid Panel:  Lab 02/16/12 1744  CHOL 168  HDL 55  LDLCALC 105*  TRIG 42  CHOLHDL 3.1  LDLDIRECT --   Thyroid Function Tests:  Lab 02/16/12 1744  TSH 1.218  T4TOTAL --  FREET4 --  T3FREE --  THYROIDAB --   Coagulation:  Lab 02/16/12 1744  LABPROT 14.3  INR 1.13   Anemia Panel:  Lab 02/16/12 1744  VITAMINB12 467  FOLATE 16.7  FERRITIN 5*  TIBC Not calculated due to Iron <10.  IRON <10*  RETICCTPCT 2.2    Urinalysis:  Lab 02/17/12 0312  COLORURINE YELLOW  LABSPEC 1.019  PHURINE 5.5  GLUCOSEU NEGATIVE  HGBUR NEGATIVE  BILIRUBINUR NEGATIVE  KETONESUR NEGATIVE  PROTEINUR NEGATIVE  UROBILINOGEN 0.2  NITRITE NEGATIVE  LEUKOCYTESUR NEGATIVE     Micro Results: Recent Results (from the past 240 hour(s))  MRSA PCR SCREENING     Status: Normal   Collection Time   02/16/12 11:18 PM      Component Value Range Status Comment   MRSA by PCR NEGATIVE  NEGATIVE Final   CULTURE, BLOOD (ROUTINE X 2)     Status: Normal (Preliminary  result)   Collection Time   02/17/12  9:43 AM      Component Value Range Status Comment   Specimen Description BLOOD RIGHT HAND   Final    Special Requests BOTTLES DRAWN AEROBIC AND ANAEROBIC 10CC   Final    Culture  Setup Time 02/17/2012 16:28   Final    Culture     Final    Value:        BLOOD CULTURE RECEIVED NO GROWTH TO DATE CULTURE WILL BE HELD FOR 5 DAYS BEFORE ISSUING A FINAL NEGATIVE REPORT   Report Status PENDING   Incomplete   CULTURE, BLOOD (ROUTINE X 2)     Status: Normal (Preliminary result)   Collection Time   02/17/12 10:34 AM      Component Value Range Status Comment   Specimen Description BLOOD RIGHT ARM   Final    Special Requests BOTTLES DRAWN AEROBIC AND ANAEROBIC 10CC   Final    Culture  Setup Time 02/17/2012 16:27   Final    Culture     Final    Value: GRAM NEGATIVE RODS     Note: Gram Stain Report Called to,Read Back By and Verified With: AERIELLE MOHAMMED  @0545  ON 02/18/2012 BY MCLET   Report Status PENDING   Incomplete    Studies/Results: US Biopsy  02/19/2012  *RADIOLOGY REPORT*  Clinical data:  Sigmoid colon mass.  Liver lesion noted on recent CT.  ULTRASOUND-GUIDED LIVER LESION CORE BIOPSY  Technique and findings: The procedure, risks (including but not limited to bleeding, infection, organ damage), benefits, and alternatives were explained to the patient.  Questions regarding the procedure were encouraged and answered.  The patient understands and consents to the procedure.Survey ultrasound of the liver was performed and the lesion was localized, and an appropriate skin entry site determined. Operator donned sterile gloves and mask.   Site was marked, prepped with Betadine, draped in usual sterile fashion, infiltrated locally with 1% lidocaine.  Intravenous Fentanyl and Versed were administered as conscious sedation during continuous cardiorespiratory monitoring by the radiology RN, with a total moderate sedation time of 10 minutes.  Under real time ultrasound guidance, a 17 gauge trocar needle was advanced to the margin of the lesion.  Once needle tip position was confirmed, coaxial 18-gauge core biopsy samples were obtained, submitted in formalin to  surgical pathology.  The guide needle was removed. Postprocedure scans show no hematoma or other apparent complication.  The patient tolerated procedure well.  No immediate complication.  IMPRESSION: 1.  Technically successful ultrasound-guided liver lesion core biopsy.   Original Report Authenticated By: Osa Craver, M.D.    Medications: I have reviewed the patient's current medications. Scheduled Meds:   . cefTRIAXone (ROCEPHIN)  IV  2 g Intravenous Q24H  . fentaNYL      . metroNIDAZOLE  500 mg Oral Q8H  . midazolam      . pantoprazole (PROTONIX) IV  40 mg Intravenous Q12H   Continuous Infusions:   . sodium chloride 150 mL/hr at 02/19/12 0847   PRN Meds:.fentaNYL, guaiFENesin,  midazolam, morphine injection, ondansetron (ZOFRAN) IV, ondansetron, DISCONTD: fentaNYL, DISCONTD: midazolam Assessment/Plan:   #Sigmoid Mass concerning for malignancy per colonoscopy on 10/24. Biopsy obtained. Surgery ( Dr Streck)consulted who recommended Liver biopsy for staging and possible resection. CEA elevated to 306. - Awaiting biopsy results from Liver and sigmoid mass - Appreciate surgery and gastroenterology assistance    #Acute blood loss anemia with Hb 6.6 on admission likely due to  sigmoid mass. S/p 3 PRBC . Hgb stable. No further GI bleed noted. - Monitor CBG  #Abdominal Pain and Diarrhea likely in the setting of sigmoid mass and possible fistula to the small bowel causing inflammatory changes per surgery. Now resolved. Patient  reports long-term history of crampy abdominal pain with episodes of constipation and diarrhea. Abdominal pain resolved since admission. Continues to have loose BM. History of partial small bowel obstruction and diverticular abscess. Possibly secondary to mass vs. IBS or IBD vs. diverticular abscess vs perforation vs diverticulitis or diverticulosis. Given CT results showing persistent marked wall thickening of a loop of distal ileum compatible with transmural inflammation associated with inflammatory bowel disease . WBC down to 10. Blood cx 02/17/12: positive for gram negative rods x1 bottle.  - Appreciate GI and Surgery assistance -Continue  cipro 500mg  PO BID and flagyl 500mg  PO TID day #3  -morphine PRN   #: Acute Kidney Injury: Cr 1.15 on admission; possibly prerenal secondary to dehydration. Resolved. - Will continue to monitor   #: Hypertension: self reported hx of hypertension many years ago but was never on medication. -continue to monitor    #: Liver lesion: Increased in size noted by CT Abdomen from September 2013 to this admission, reviewed with radiology--Increased conspicuity of a lesion in segment 6 of the liver noted, measuring 4.5 x 3.1  cm with an approximately 1.1 cm low density periphery and a 2.3 cm central portion of higher density than the liver. The central portion remains high in density on delayed images. Per surgery lesion noted in September 2013 was not too concerning at that time.  - liver biopsy obtained by IR on 10/25> will await path report  # Suicidal ideation: After patient was given the diagnosis patient was very upset and had  suicidal ideation. Patient was placed on suicide precaution. Patient today noted that she is doing great. She noted that it was a devastating news and she was really upset. Since this am patient denies any suicidal ideation or homo suicidal ideation. - Continue suicidal precaution until cleared by Psychiatry   DVT Px: SCDs  Diet: clear liquid diet until surgery    LOS: 3 days   Donne Baley 02/19/2012, 3:06 PM

## 2012-02-19 NOTE — Procedures (Signed)
US liver core lesion bx 18g x2 No complication No blood loss. See complete dictation in [facility].

## 2012-02-20 LAB — CULTURE, BLOOD (ROUTINE X 2)

## 2012-02-20 LAB — CBC
Platelets: 275 10*3/uL (ref 150–400)
RBC: 3.2 MIL/uL — ABNORMAL LOW (ref 3.87–5.11)
RDW: 17.3 % — ABNORMAL HIGH (ref 11.5–15.5)
WBC: 9.2 10*3/uL (ref 4.0–10.5)

## 2012-02-20 NOTE — Progress Notes (Signed)
Subjective: Ms. Schellinger was seen and examined at bedside.  We had a very long discussion about colonoscopy findings and pending biopsy results.  She was initially scared and trying to process finding out the difficult news, but is more optimistic and hopeful at this time.  She is patiently awaiting surgery.  She denies any suicidal or homicidal ideation.  She claims it was a misunderstanding with nursing who thought she was suicidal.  She claims she never wanted to harm herself nor did she express the intent or plan.  She said she had just come back from colonoscopy and was trying to process the news and wanted to be by herself initially and then felt better.  She denies any headaches, chest pain, nausea, vomiting, SOB, abdominal pain, fever, or chills, or bloody stool at this time.   Objective: Vital signs in last 24 hours: Filed Vitals:   02/19/12 1455 02/19/12 2219 02/20/12 0535 02/20/12 0706  BP:  176/93 161/97 161/92  Pulse: 76 74 75 83  Temp: 98.4 F (36.9 C) 99 F (37.2 C) 98.3 F (36.8 C)   TempSrc: Axillary Oral Oral   Resp: 16 13 15    Height:      Weight:      SpO2: 100% 98% 99%    Weight change:   Intake/Output Summary (Last 24 hours) at 02/20/12 0757 Last data filed at 02/20/12 0551  Gross per 24 hour  Intake 2821.25 ml  Output      0 ml  Net 2821.25 ml   Physical Exam: Constitutional: Vital signs reviewed.  Patient is a well-developed and well-nourished  in no acute distress and cooperative with exam. Alert and oriented x3.   HEENT: PERRLA, EOMI, [place]/AT, neck supple.  Cardiovascular: RRR, S1 normal, S2 normal, no MRG, pulses symmetric and intact bilaterally Pulmonary/Chest: CTAB, no wheezes, rales, or rhonchi Abdominal: Soft. Non-tender, non-distended, bowel sounds are normal, Neurological: A&O x3, strength and sensation equal and gross intact in b/l upper and lower extremities.   Lab Results: Basic Metabolic Panel:  Lab 02/17/12 1610 02/16/12 1433  NA 136 137  K 4.2  4.0  CL 105 102  CO2 19 23  GLUCOSE 86 99  BUN 12 16  CREATININE 0.90 1.15*  CALCIUM 9.1 9.7  MG -- --  PHOS -- --   Liver Function Tests:  Lab 02/16/12 1433  AST 14  ALT 9  ALKPHOS 77  BILITOT 0.2*  PROT 8.2  ALBUMIN 3.8    CBC:  Lab 02/20/12 0500 02/19/12 1117 02/16/12 1433  WBC 9.2 10.0 --  NEUTROABS -- -- 9.3*  HGB 8.2* 8.5* --  HCT 25.3* 26.3* --  MCV 79.1 78.3 --  PLT 275 264 --   Cardiac Enzymes:  Lab 02/16/12 1744  CKTOTAL --  CKMB --  CKMBINDEX --  TROPONINI <0.30   Hemoglobin A1C:  Lab 02/16/12 1744  HGBA1C 5.9*   Fasting Lipid Panel:  Lab 02/16/12 1744  CHOL 168  HDL 55  LDLCALC 105*  TRIG 42  CHOLHDL 3.1  LDLDIRECT --   Thyroid Function Tests:  Lab 02/16/12 1744  TSH 1.218  T4TOTAL --  FREET4 --  T3FREE --  THYROIDAB --   Coagulation:  Lab 02/16/12 1744  LABPROT 14.3  INR 1.13   Anemia Panel:  Lab 02/16/12 1744  VITAMINB12 467  FOLATE 16.7  FERRITIN 5*  TIBC Not calculated due to Iron <10.  IRON <10*  RETICCTPCT 2.2    Urinalysis:  Lab 02/17/12 0312  COLORURINE  YELLOW  LABSPEC 1.019  PHURINE 5.5  GLUCOSEU NEGATIVE  HGBUR NEGATIVE  BILIRUBINUR NEGATIVE  KETONESUR NEGATIVE  PROTEINUR NEGATIVE  UROBILINOGEN 0.2  NITRITE NEGATIVE  LEUKOCYTESUR NEGATIVE   Micro Results: Recent Results (from the past 240 hour(s))  MRSA PCR SCREENING     Status: Normal   Collection Time   02/16/12 11:18 PM      Component Value Range Status Comment   MRSA by PCR NEGATIVE  NEGATIVE Final   CULTURE, BLOOD (ROUTINE X 2)     Status: Normal (Preliminary result)   Collection Time   02/17/12  9:43 AM      Component Value Range Status Comment   Specimen Description BLOOD RIGHT HAND   Final    Special Requests BOTTLES DRAWN AEROBIC AND ANAEROBIC 10CC   Final    Culture  Setup Time 02/17/2012 16:28   Final    Culture     Final    Value:        BLOOD CULTURE RECEIVED NO GROWTH TO DATE CULTURE WILL BE HELD FOR 5 DAYS BEFORE ISSUING  A FINAL NEGATIVE REPORT   Report Status PENDING   Incomplete   CULTURE, BLOOD (ROUTINE X 2)     Status: Normal (Preliminary result)   Collection Time   02/17/12 10:34 AM      Component Value Range Status Comment   Specimen Description BLOOD RIGHT ARM   Final    Special Requests BOTTLES DRAWN AEROBIC AND ANAEROBIC 10CC   Final    Culture  Setup Time 02/17/2012 16:27   Final    Culture     Final    Value: GRAM NEGATIVE RODS     Note: Gram Stain Report Called to,Read Back By and Verified With: AERIELLE MOHAMMED @0545  ON 02/18/2012 BY MCLET   Report Status PENDING   Incomplete    Studies/Results: US Biopsy  02/19/2012  *RADIOLOGY REPORT*  Clinical data:  Sigmoid colon mass.  Liver lesion noted on recent CT.  ULTRASOUND-GUIDED LIVER LESION CORE BIOPSY  Technique and findings: The procedure, risks (including but not limited to bleeding, infection, organ damage), benefits, and alternatives were explained to the patient.  Questions regarding the procedure were encouraged and answered.  The patient understands and consents to the procedure.Survey ultrasound of the liver was performed and the lesion was localized, and an appropriate skin entry site determined. Operator donned sterile gloves and mask.   Site was marked, prepped with Betadine, draped in usual sterile fashion, infiltrated locally with 1% lidocaine.  Intravenous Fentanyl and Versed were administered as conscious sedation during continuous cardiorespiratory monitoring by the radiology RN, with a total moderate sedation time of 10 minutes.  Under real time ultrasound guidance, a 17 gauge trocar needle was advanced to the margin of the lesion.  Once needle tip position was confirmed, coaxial 18-gauge core biopsy samples were obtained, submitted in formalin to  surgical pathology.  The guide needle was removed. Postprocedure scans show no hematoma or other apparent complication.  The patient tolerated procedure well.  No immediate complication.   IMPRESSION: 1.  Technically successful ultrasound-guided liver lesion core biopsy.   Original Report Authenticated By: Osa Craver, M.D.    Medications: I have reviewed the patient's current medications. Scheduled Meds:    . cefTRIAXone (ROCEPHIN)  IV  2 g Intravenous Q24H  . fentaNYL      . metroNIDAZOLE  500 mg Oral Q8H  . midazolam      . DISCONTD: pantoprazole (PROTONIX) IV  40 mg Intravenous Q12H   Continuous Infusions:    . sodium chloride 75 mL/hr at 02/20/12 0528  . DISCONTD: sodium chloride     PRN Meds:.fentaNYL, guaiFENesin, HYDROcodone-acetaminophen, midazolam, morphine injection, ondansetron (ZOFRAN) IV, ondansetron Assessment/Plan:   #Sigmoid Mass concerning for malignancy per colonoscopy on 10/24. Biopsy obtained. Surgery ( Dr Jamey Ripa) consulted who recommended Liver biopsy for staging and possible resection. CEA elevated to 306. S/p liver biopsy 02/19/12 - Awaiting biopsy results from Liver and sigmoid mass - Appreciate surgery and gastroenterology assistance - likely surgery for colon 24-48 hours   #Acute blood loss anemia with Hb 6.6 on admission likely due to sigmoid mass. S/p 3 PRBC . Hgb stable. No further GI bleed noted. 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 Now resolved. Patient  reports long-term history of crampy abdominal pain with episodes of constipation and diarrhea. Abdominal pain resolved since admission. Continues to have loose BM. History of partial small bowel obstruction and diverticular abscess. Possibly secondary to mass vs. IBS or IBD vs. diverticular abscess vs perforation vs diverticulitis or diverticulosis. Given CT results showing persistent marked wall thickening of a loop of distal ileum compatible with transmural inflammation associated with inflammatory bowel disease . WBC down to 9.2. Blood cx 02/17/12:  positive for gram negative rods x1 bottle.  - Appreciate GI and Surgery assistance -Continue  Rocephin 2g IV (day 3)and flagyl 500mg  PO TID (day 4) today abx day #4 -morphine PRN   #: Acute Kidney Injury: Cr 1.15 on admission; possibly prerenal secondary to dehydration. Resolved. - Will continue to monitor   #: Hypertension: self reported hx of hypertension many years ago but was never on medication. -continue to monitor    #: Liver lesion: Increased in size noted by CT Abdomen from September 2013 to this admission, reviewed with radiology--Increased conspicuity of a lesion in segment 6 of the liver noted, measuring 4.5 x 3.1 cm with an approximately 1.1 cm low density periphery and a 2.3 cm central portion of higher density than the liver. The central portion remains high in density on delayed images. Per surgery lesion noted in September 2013 was not too concerning at that time.  - liver biopsy obtained by IR on 10/25> will await path report  # Suicidal ideation: After she was given the diagnosis, she was very upset and was noted to have suicidal ideation as per nursing. Was placed on suicide precaution.  She noted that it was devastating news at first and she was really upset. Notes Feeling better since initial news. Currently denies any suicidal ideation or homosuicidal ideation. - Continue suicidal precaution until cleared by Psychiatry - sitter in room present   DVT Px: SCDs  Diet: clear liquid diet until surgery    LOS: 4 days   [name] 02/20/2012, 7:57 AM

## 2012-02-20 NOTE — Progress Notes (Signed)
2 Days Post-Op  Subjective: Doing well,sitter at bedside, she offers no c/o at present.  Objective: Vital signs in last 24 hours: Temp:  [98.3 F (36.8 C)-99 F (37.2 C)] 98.3 F (36.8 C) (10/26 0535) Pulse Rate:  [74-90] 83  (10/26 0706) Resp:  [13-16] 15  (10/26 0535) BP: (161-176)/(77-97) 161/92 mmHg (10/26 0706) SpO2:  [97 %-100 %] 99 % (10/26 0535) Last BM Date: 02/20/12  Intake/Output from previous day: 10/25 0701 - 10/26 0700 In: 8688.8 [P.O.:480; I.V.:8158.8; IV Piggyback:50] Out: -  Intake/Output this shift: Total I/O In: 320 [P.O.:320] Out: -   General appearance: alert, cooperative, appears stated age and mild distress, affect is engaging good eye contact. Chest: CTA  Cardiac: RRR Abdomen: soft, non-tender, +BS Extremities: Warm to touch + pulses no edema  Lab Results:   Basename 02/20/12 0500 02/19/12 1117  WBC 9.2 10.0  HGB 8.2* 8.5*  HCT 25.3* 26.3*  PLT 275 264   BMET No results found for this basename: NA:2,K:2,CL:2,CO2:2,GLUCOSE:2,BUN:2,CREATININE:2,CALCIUM:2 in the last 72 hours PT/INR No results found for this basename: LABPROT:2,INR:2 in the last 72 hours ABG No results found for this basename: PHART:2,PCO2:2,PO2:2,HCO3:2 in the last 72 hours  Studies/Results: US Biopsy  02/19/2012  *RADIOLOGY REPORT*  Clinical data:  Sigmoid colon mass.  Liver lesion noted on recent CT.  ULTRASOUND-GUIDED LIVER LESION CORE BIOPSY  Technique and findings: The procedure, risks (including but not limited to bleeding, infection, organ damage), benefits, and alternatives were explained to the patient.  Questions regarding the procedure were encouraged and answered.  The patient understands and consents to the procedure.Survey ultrasound of the liver was performed and the lesion was localized, and an appropriate skin entry site determined. Operator donned sterile gloves and mask.   Site was marked, prepped with Betadine, draped in usual sterile fashion, infiltrated  locally with 1% lidocaine.  Intravenous Fentanyl and Versed were administered as conscious sedation during continuous cardiorespiratory monitoring by the radiology RN, with a total moderate sedation time of 10 minutes.  Under real time ultrasound guidance, a 17 gauge trocar needle was advanced to the margin of the lesion.  Once needle tip position was confirmed, coaxial 18-gauge core biopsy samples were obtained, submitted in formalin to  surgical pathology.  The guide needle was removed. Postprocedure scans show no hematoma or other apparent complication.  The patient tolerated procedure well.  No immediate complication.  IMPRESSION: 1.  Technically successful ultrasound-guided liver lesion core biopsy.   Original Report Authenticated By: Osa Craver, M.D.     Anti-infectives: Anti-infectives     Start     Dose/Rate Route Frequency Ordered Stop   02/18/12 1400   cefTRIAXone (ROCEPHIN) 2 g in dextrose 5 % 50 mL IVPB        2 g 100 mL/hr over 30 Minutes Intravenous Every 24 hours 02/18/12 1336     02/17/12 1000   ciprofloxacin (CIPRO) tablet 500 mg  Status:  Discontinued     Comments: PLEASE GIVE AFTER BLOOD CULTURES DRAWN      500 mg Oral 2 times daily 02/17/12 0939 02/18/12 1315   02/17/12 1000   metroNIDAZOLE (FLAGYL) tablet 500 mg     Comments: PLEASE GIVE AFTER BLOOD CULTURES DRAWN      500 mg Oral 3 times per day 02/17/12 4696            Assessment/Plan: s/p Procedure(s) (LRB) with comments: COLONOSCOPY (N/A) Await liver Biopsy results Probable sigmoid resection ? Liver resection next week.  LOS: 4 days    [name] 02/20/2012

## 2012-02-21 DIAGNOSIS — F329 Major depressive disorder, single episode, unspecified: Secondary | ICD-10-CM

## 2012-02-21 LAB — CBC
HCT: 26.8 % — ABNORMAL LOW (ref 36.0–46.0)
Hemoglobin: 8.7 g/dL — ABNORMAL LOW (ref 12.0–15.0)
MCHC: 32.5 g/dL (ref 30.0–36.0)
RBC: 3.39 MIL/uL — ABNORMAL LOW (ref 3.87–5.11)
WBC: 8 10*3/uL (ref 4.0–10.5)

## 2012-02-21 MED ORDER — AMLODIPINE BESYLATE 5 MG PO TABS
5.0000 mg | ORAL_TABLET | Freq: Every day | ORAL | Status: DC
  Administered 2012-02-21 – 2012-02-23 (×3): 5 mg via ORAL
  Filled 2012-02-21 (×5): qty 1

## 2012-02-21 NOTE — Progress Notes (Addendum)
Subjective: [name] was seen and examined at bedside.  Dr. Jamey Ripa was also present in the room who informed [name] that Mr. Leete will likely being going to the OR on Tuesday pending biopsy results.  Otherwise, Ms. Lamberg has no overnight complaints.  The sitter is still present in the room with her until she is seen by psychiatry.  She reports feeling more optimistic each day but is nervous about her biopsy results.  She does report one loose BM yesterday with blood, but otherwise has no major complaints at this time.  She denies any fever, chills, N/V, abdominal pain, chest pain, or shortness of breath at this time.    Objective: Vital signs in last 24 hours: Filed Vitals:   02/20/12 0706 02/20/12 1351 02/20/12 2128 02/21/12 0501  BP: 161/92 140/72 173/85 171/89  Pulse: 83 80 72 72  Temp:  98.5 F (36.9 C) 98 F (36.7 C) 98.2 F (36.8 C)  TempSrc:  Oral Oral Oral  Resp:   16   Height:      Weight:      SpO2:  100% 98% 100%   Weight change:   Intake/Output Summary (Last 24 hours) at 02/21/12 0825 Last data filed at 02/20/12 1814  Gross per 24 hour  Intake 1368.75 ml  Output      0 ml  Net 1368.75 ml   Physical Exam: Constitutional: Vital signs reviewed.  Patient is a well-developed and well-nourished  in no acute distress and cooperative with exam. Alert and oriented x3.   HEENT: PERRLA, EOMI, [place]/AT, neck supple.  Cardiovascular: RRR + systolic murmur 2-3/6 right sternal border Pulmonary/Chest: CTAB, no wheezes, rales, or rhonchi Abdominal: Soft. Non-tender, non-distended, bowel sounds are normal, Neurological: A&O x3, strength and sensation equal and gross intact in b/l upper and lower extremities.  Skin: warm, grey, dry eczematous patch on left index finger.   Lab Results: Basic Metabolic Panel:  Lab 02/17/12 1610 02/16/12 1433  NA 136 137  K 4.2 4.0  CL 105 102  CO2 19 23  GLUCOSE 86 99  BUN 12 16  CREATININE 0.90 1.15*  CALCIUM 9.1 9.7  MG -- --  PHOS -- --   Liver  Function Tests:  Lab 02/16/12 1433  AST 14  ALT 9  ALKPHOS 77  BILITOT 0.2*  PROT 8.2  ALBUMIN 3.8    CBC:  Lab 02/20/12 0500 02/19/12 1117 02/16/12 1433  WBC 9.2 10.0 --  NEUTROABS -- -- 9.3*  HGB 8.2* 8.5* --  HCT 25.3* 26.3* --  MCV 79.1 78.3 --  PLT 275 264 --   Cardiac Enzymes:  Lab 02/16/12 1744  CKTOTAL --  CKMB --  CKMBINDEX --  TROPONINI <0.30   Hemoglobin A1C:  Lab 02/16/12 1744  HGBA1C 5.9*   Fasting Lipid Panel:  Lab 02/16/12 1744  CHOL 168  HDL 55  LDLCALC 105*  TRIG 42  CHOLHDL 3.1  LDLDIRECT --   Thyroid Function Tests:  Lab 02/16/12 1744  TSH 1.218  T4TOTAL --  FREET4 --  T3FREE --  THYROIDAB --   Coagulation:  Lab 02/16/12 1744  LABPROT 14.3  INR 1.13   Anemia Panel:  Lab 02/16/12 1744  VITAMINB12 467  FOLATE 16.7  FERRITIN 5*  TIBC Not calculated due to Iron <10.  IRON <10*  RETICCTPCT 2.2    Urinalysis:  Lab 02/17/12 0312  COLORURINE YELLOW  LABSPEC 1.019  PHURINE 5.5  GLUCOSEU NEGATIVE  HGBUR NEGATIVE  BILIRUBINUR NEGATIVE  KETONESUR NEGATIVE  PROTEINUR NEGATIVE  UROBILINOGEN 0.2  NITRITE NEGATIVE  LEUKOCYTESUR NEGATIVE   Micro Results: Recent Results (from the past 240 hour(s))  MRSA PCR SCREENING     Status: Normal   Collection Time   02/16/12 11:18 PM      Component Value Range Status Comment   MRSA by PCR NEGATIVE  NEGATIVE Final   CULTURE, BLOOD (ROUTINE X 2)     Status: Normal (Preliminary result)   Collection Time   02/17/12  9:43 AM      Component Value Range Status Comment   Specimen Description BLOOD RIGHT HAND   Final    Special Requests BOTTLES DRAWN AEROBIC AND ANAEROBIC 10CC   Final    Culture  Setup Time 02/17/2012 16:28   Final    Culture     Final    Value:        BLOOD CULTURE RECEIVED NO GROWTH TO DATE CULTURE WILL BE HELD FOR 5 DAYS BEFORE ISSUING A FINAL NEGATIVE REPORT   Report Status PENDING   Incomplete   CULTURE, BLOOD (ROUTINE X 2)     Status: Normal   Collection Time     02/17/12 10:34 AM      Component Value Range Status Comment   Specimen Description BLOOD RIGHT ARM   Final    Special Requests BOTTLES DRAWN AEROBIC AND ANAEROBIC 10CC   Final    Culture  Setup Time 02/17/2012 16:27   Final    Culture     Final    Value: ESCHERICHIA COLI     Note: Gram Stain Report Called to,Read Back By and Verified With: AERIELLE MOHAMMED @0545  ON 02/18/2012 BY MCLET   Report Status 02/20/2012 FINAL   Final    Organism ID, Bacteria ESCHERICHIA COLI   Final    Studies/Results: [name] Biopsy  02/19/2012  *RADIOLOGY REPORT*  Clinical data:  Sigmoid colon mass.  Liver lesion noted on recent CT.  ULTRASOUND-GUIDED LIVER LESION CORE BIOPSY  Technique and findings: The procedure, risks (including but not limited to bleeding, infection, organ damage), benefits, and alternatives were explained to the patient.  Questions regarding the procedure were encouraged and answered.  The patient understands and consents to the procedure.Survey ultrasound of the liver was performed and the lesion was localized, and an appropriate skin entry site determined. Operator donned sterile gloves and mask.   Site was marked, prepped with Betadine, draped in usual sterile fashion, infiltrated locally with 1% lidocaine.  Intravenous Fentanyl and Versed were administered as conscious sedation during continuous cardiorespiratory monitoring by the radiology RN, with a total moderate sedation time of 10 minutes.  Under real time ultrasound guidance, a 17 gauge trocar needle was advanced to the margin of the lesion.  Once needle tip position was confirmed, coaxial 18-gauge core biopsy samples were obtained, submitted in formalin to  surgical pathology.  The guide needle was removed. Postprocedure scans show no hematoma or other apparent complication.  The patient tolerated procedure well.  No immediate complication.  IMPRESSION: 1.  Technically successful ultrasound-guided liver lesion core biopsy.   Original Report  Authenticated By: Osa Craver, M.D.    Medications: I have reviewed the patient's current medications. Scheduled Meds:    . cefTRIAXone (ROCEPHIN)  IV  2 g Intravenous Q24H  . metroNIDAZOLE  500 mg Oral Q8H   Continuous Infusions:    . sodium chloride 75 mL (02/21/12 0737)   PRN Meds:.guaiFENesin, HYDROcodone-acetaminophen, morphine injection, ondansetron (ZOFRAN) IV, ondansetron Assessment/Plan:   #Sigmoid Mass  concerning for malignancy per colonoscopy on 10/24. Biopsy obtained. Surgery ( Dr Jamey Ripa) following. CEA elevated to 306. S/p liver biopsy 02/19/12 - Awaiting biopsy results from Liver and sigmoid mass - Appreciate surgery and gastroenterology assistance - likely surgery for colon and/or liver Tuesday 02/23/12  #Acute blood loss anemia with Hb 6.6 on admission likely due to sigmoid mass. S/p 3 PRBC . Hgb stable. No further GI bleed noted. 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 Now resolved. Patient  reports long-term history of crampy abdominal pain with episodes of constipation and diarrhea. Abdominal pain resolved since admission. Continues to have loose BM. History of partial small bowel obstruction and diverticular abscess. Possibly secondary to mass vs. IBS or IBD vs. diverticular abscess vs perforation vs diverticulitis or diverticulosis. Given CT results showing persistent marked wall thickening of a loop of distal ileum compatible with transmural inflammation associated with inflammatory bowel disease . WBC down to 9.2. Blood cx 02/17/12: positive for gram negative rods x1 bottle.  - Appreciate GI and Surgery assistance -Continue  Rocephin 2g IV (day 4)and flagyl 500mg  PO TID (day 5) today abx day #5 -morphine PRN   #: Acute Kidney Injury: Cr 1.15 on admission;  possibly prerenal secondary to dehydration. Resolved. - Will continue to monitor  -f/u cmp  #: Hypertension: self reported hx of hypertension many years ago but was never on medication. -continue to monitor   -start norvasc 5mg  qd  #: Liver lesion: Increased in size noted by CT Abdomen from September 2013 to this admission, reviewed with radiology--Increased conspicuity of a lesion in segment 6 of the liver noted, measuring 4.5 x 3.1 cm with an approximately 1.1 cm low density periphery and a 2.3 cm central portion of higher density than the liver. The central portion remains high in density on delayed images. Per surgery lesion noted in September 2013 was not too concerning at that time.  - liver biopsy obtained by IR on 10/25> will await path report, surgery if needed  # Suicidal ideation: After she was given the diagnosis, she was very upset and was noted to have suicidal ideation as per nursing. Was placed on suicide precaution.  She noted that it was devastating news at first and she was really upset. Notes Feeling better since initial news. Currently denies any suicidal ideation or homosuicidal ideation. - Continue suicidal precaution until cleared by Psychiatry--should be seen today, called service again yesterday - sitter in room present   DVT Px: SCDs  Diet: clear liquid diet until surgery    LOS: 5 days   Darden Palmer 02/21/2012, 8:25 AM

## 2012-02-21 NOTE — Progress Notes (Signed)
Acute blood loss anemia  Assessment: Acute blood loss anemia Obstructing lesion sigmoid, likely cancer, with probable levier met, biopsies pending and should be available tomorrow Anemia from bleed for her cancer  Plan: She will need sigmoid colectomy, hopefully with primary anastomosis;she may need liver resection as well.Discussed again with her   Subjective: Feels fine, no c/o this am. Anxious to have decision made and surgery as indicated  Objective: Vital signs in last 24 hours: Temp:  [98 F (36.7 C)-98.5 F (36.9 C)] 98.2 F (36.8 C) (10/27 0501) Pulse Rate:  [72-80] 72  (10/27 0501) Resp:  [16] 16  (10/26 2128) BP: (140-173)/(72-89) 171/89 mmHg (10/27 0501) SpO2:  [98 %-100 %] 100 % (10/27 0501) Last BM Date: 02/21/12  Intake/Output from previous day: 10/26 0701 - 10/27 0700 In: 1368.8 [P.O.:440; I.V.:928.8] Out: -  Intake/Output this shift: Total I/O In: 240 [P.O.:240] Out: -   General appearance: alert and no distress  Lab Results:  No results found for this or any previous visit (from the past 24 hour(s)).   Studies/Results Radiology     MEDS, Scheduled    . cefTRIAXone (ROCEPHIN)  IV  2 g Intravenous Q24H  . metroNIDAZOLE  500 mg Oral Q8H       LOS: 5 days    Currie Paris, MD, [facility], [place] [phone]   02/21/2012 9:28 AM

## 2012-02-21 NOTE — Consult Note (Signed)
Reason for Consult: Suicide Risk Assesment Referring Physician: [name] is an 56 y.o. female.   CC: " I just got sick at work and came in for a check up and found out I have cancer."  HPI: The patient is a 56 y/o woman.  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  She became very distraught and emotional and was therefore put on 1:1 suicide precautions. Psychiatry was called to evaluate the patient for further need for 1:1.  The patient admit she was very emotional but denies any suicidal thoughts.  She reports that though she found the emotional support from her sitters helpful, she would like privacy particularly when going to the bathroom.  She states she is hopeful with the treatment options provided to her and will be going for a surgical resection this week.  She states she has a good support system with both her family and church and looks forward to spending time with her grandchildren.   With the patient's verbal consent, I called the patient's daughter who reported no concerns for the patient. The patient's daughter denies any  suicidal or self harm behaviors.  Past Psychiatric history: Hospitalizations: One previous admission after her mother died. Suicide Attempts: Patient denies.  Substance abuse treatment: Patient denies. Violent behaviors: Patient denies Past psychotropic medications: Patient denies. Patient denies having any guns at home.   Past Medical History  Diagnosis Date  . Hypertension     pt states she stopped taking meds while in her 20's    Past Surgical History  Procedure Date  . Abdominal hysterectomy   . Breast lumpectomy     left breast    History reviewed. No pertinent family history.  Past Family Psychiatric history: Mood or Thought Disorders: Patient denies. Substance Abuse: Patient denies. Suicide Attempts: Patient denies.  Social History:  reports that she has never smoked. She  has never used smokeless tobacco. She reports that she drinks alcohol. She reports that she does not use illicit drugs. Born: [place], [place] Raised: [place], [place] Currently employed Lives with her daughter. She reports her main source of emotional support is her daughter, son, friends, and church Religious Beliefs: She prays and attends church: Hobbies: She enjoy spending time with her grandchildren.  Allergies: No Known Allergies  Medications: I have reviewed the patient's current medications.  Results for orders placed during the hospital encounter of 02/16/12 (from the past 48 hour(s))  CBC     Status: Abnormal   Collection Time   02/20/12  5:00 AM      Component Value Range Comment   WBC 9.2  4.0 - 10.5 K/uL    RBC 3.20 (*) 3.87 - 5.11 MIL/uL    Hemoglobin 8.2 (*) 12.0 - 15.0 g/dL    HCT 96.2 (*) 95.2 - 46.0 %    MCV 79.1  78.0 - 100.0 fL    MCH 25.6 (*) 26.0 - 34.0 pg    MCHC 32.4  30.0 - 36.0 g/dL    RDW 84.1 (*) 32.4 - 15.5 %    Platelets 275  150 - 400 K/uL   CBC     Status: Abnormal   Collection Time   02/21/12 11:01 AM      Component Value Range Comment   WBC 8.0  4.0 - 10.5 K/uL    RBC 3.39 (*) 3.87 - 5.11 MIL/uL    Hemoglobin 8.7 (*) 12.0 - 15.0 g/dL  HCT 26.8 (*) 36.0 - 46.0 %    MCV 79.1  78.0 - 100.0 fL    MCH 25.7 (*) 26.0 - 34.0 pg    MCHC 32.5  30.0 - 36.0 g/dL    RDW 16.1 (*) 09.6 - 15.5 %    Platelets 268  150 - 400 K/uL     No results found.  Review of Systems  Constitutional:       As per chart.  -As noted in chart Blood pressure 162/94, pulse 72, temperature 98.2 F (36.8 C), temperature source Oral, resp. rate 16, height 5\' 2"  (1.575 m), weight 112.2 kg (247 lb 5.7 oz), SpO2 100.00%. Physical Exam  Constitutional: She appears well-developed and well-nourished. No distress.  Skin: She is not diaphoretic.   MENTAL STATUS EXAM:  ORIENTATION AND CONSCIOUSNESS:  alert and attentive/oriented x3  APPEARANCE AND BEHAVIOR:  Appropriately  dressed and groomed SPEECH:  Normal rate and rhythm LANGUAGE:  intact  MOOD AND AFFECT:  Mood "good"  Affect-congruent with mood.  PERCEPTUAL DISTURBANCE (hallucinations, illusions):  Patient denies auditory or visual hallucinations. THOUGHT PROCESS AND ASSOCIATION:  Clear coherent THOUGHT CONTENT (delusions, obsessions etc.):  Within normal limits SUICIDAL OR VIOLENT IDEATION:  Patient denies. INSIGHT:  good JUDGMENT:  good MEMORY:  intact FUND OF KNOWLEDGE  Average  Suicide Risk Assessment: Minimal to none. Patient denies weapons at home, specifically guns.   Patient has strong social supports and religious beliefs. No previous suicide attempts.  Has future goals.    Assessment/Plan: AXIS I:  No Diagnosis; Axis II: No Diagnosis Axis III: Abdominal Mass Axis IV: None Axis IV: 65  Plan: 1. May discontinue 1:1 Sitter as patient's risk of suicide is minimal to none based on assessment.  2. If the patient has any symptoms of depression at a later time she may be referred for outpatient psychiatric treatment.    [name] 02/21/2012, 2:19 PM

## 2012-02-21 NOTE — Progress Notes (Signed)
She voices understanding of issues and plans

## 2012-02-22 ENCOUNTER — Inpatient Hospital Stay (HOSPITAL_COMMUNITY): Payer: Medicaid Other

## 2012-02-22 ENCOUNTER — Encounter (HOSPITAL_COMMUNITY): Payer: Self-pay | Admitting: Gastroenterology

## 2012-02-22 DIAGNOSIS — C187 Malignant neoplasm of sigmoid colon: Principal | ICD-10-CM

## 2012-02-22 LAB — CBC WITH DIFFERENTIAL/PLATELET
Eosinophils Relative: 5 % (ref 0–5)
Hemoglobin: 9.4 g/dL — ABNORMAL LOW (ref 12.0–15.0)
Lymphocytes Relative: 25 % (ref 12–46)
Lymphs Abs: 1.8 10*3/uL (ref 0.7–4.0)
MCH: 26.5 pg (ref 26.0–34.0)
MCV: 79.2 fL (ref 78.0–100.0)
Monocytes Relative: 6 % (ref 3–12)
Platelets: 282 10*3/uL (ref 150–400)
RBC: 3.55 MIL/uL — ABNORMAL LOW (ref 3.87–5.11)
WBC: 7.4 10*3/uL (ref 4.0–10.5)

## 2012-02-22 LAB — COMPREHENSIVE METABOLIC PANEL
ALT: 9 U/L (ref 0–35)
Alkaline Phosphatase: 69 U/L (ref 39–117)
BUN: 3 mg/dL — ABNORMAL LOW (ref 6–23)
CO2: 28 mEq/L (ref 19–32)
GFR calc Af Amer: 86 mL/min — ABNORMAL LOW (ref >90)
GFR calc non Af Amer: 75 mL/min — ABNORMAL LOW (ref >90)
Glucose, Bld: 96 mg/dL (ref 70–99)
Potassium: 3.3 mEq/L — ABNORMAL LOW (ref 3.5–5.1)
Total Bilirubin: 0.3 mg/dL (ref 0.3–1.2)
Total Protein: 7.8 g/dL (ref 6.0–8.3)

## 2012-02-22 MED ORDER — POTASSIUM CHLORIDE CRYS ER 20 MEQ PO TBCR
EXTENDED_RELEASE_TABLET | ORAL | Status: AC
  Administered 2012-02-22: 40 meq via ORAL
  Filled 2012-02-22: qty 2

## 2012-02-22 MED ORDER — POTASSIUM CHLORIDE CRYS ER 20 MEQ PO TBCR
40.0000 meq | EXTENDED_RELEASE_TABLET | Freq: Once | ORAL | Status: AC
  Administered 2012-02-22: 40 meq via ORAL

## 2012-02-22 MED ORDER — IOHEXOL 300 MG/ML  SOLN
80.0000 mL | Freq: Once | INTRAMUSCULAR | Status: AC | PRN
  Administered 2012-02-22: 80 mL via INTRAVENOUS

## 2012-02-22 MED ORDER — HYDROCHLOROTHIAZIDE 25 MG PO TABS
25.0000 mg | ORAL_TABLET | Freq: Every day | ORAL | Status: DC
  Administered 2012-02-22 – 2012-02-23 (×2): 25 mg via ORAL
  Filled 2012-02-22 (×3): qty 1

## 2012-02-22 NOTE — Progress Notes (Signed)
[id number] RN notified doctor on call re: BP 181/93.  Telephone order to continue monitoring , will decide if medicine is required for patient.  Patient has no headache or chestpain.

## 2012-02-22 NOTE — Progress Notes (Addendum)
Subjective: Ms. Salas was seen and examined at bedside.  She was just seen by surgery who informed her they are still awaiting biopsy results and will know final date and time of surgery once the results are in.  In the meantime they would like her to continue clear liquids.  Ms. Meche reports one BM this morning with "a little bit of blood" but otherwise has no complaints at this time.  She slept well through the night, was seen by psychiatry yesterday evening, and suicide precautions were discontinued.  She denies any fever, chills, N/V, abdominal pain, chest pain, or shortness of breath at this time.  We had a long discussion with her this morning in regards to biopsy results, surgery, and need for CT chest evaluation as well.  Ms. Aschenbrenner wishes to receive no more bad news at this time claiming she needs "a win" today as well.  She is an agreement for CT chest at this time but does not wish to know results if they are negative at this time.    Objective: Vital signs in last 24 hours: Filed Vitals:   02/21/12 1532 02/21/12 2108 02/21/12 2300 02/22/12 0450  BP: 178/87 181/93 154/76 167/78  Pulse: 84 80 75 68  Temp: 97.9 F (36.6 C) 98.1 F (36.7 C)  98 F (36.7 C)  TempSrc: Oral Oral  Oral  Resp: 20 18  20   Height:      Weight:      SpO2: 100% 96%  100%   Weight change:   Intake/Output Summary (Last 24 hours) at 02/22/12 0839 Last data filed at 02/21/12 1814  Gross per 24 hour  Intake   2570 ml  Output      0 ml  Net   2570 ml   Physical Exam: Constitutional: Vital signs reviewed.  She is in no acute distress and cooperative with exam. Alert and oriented x3.   HEENT: PERRLA, EOMI, [place]/AT, neck supple.  Cardiovascular:  RRR + systolic murmur 2-3/6 right sternal border Pulmonary/Chest: CTAB, no wheezes, rales, or rhonchi Abdominal: Soft. Non-tender, non-distended, + bowel sounds present Neurological: A&O x3, strength and sensation equal and gross intact in b/l upper and lower extremities.   Skin: warm, dry eczematous patch on left index finger.  Lab Results: Basic Metabolic Panel:  Lab 02/17/12 9604 02/16/12 1433  NA 136 137  K 4.2 4.0  CL 105 102  CO2 19 23  GLUCOSE 86 99  BUN 12 16  CREATININE 0.90 1.15*  CALCIUM 9.1 9.7  MG -- --  PHOS -- --   Liver Function Tests:  Lab 02/16/12 1433  AST 14  ALT 9  ALKPHOS 77  BILITOT 0.2*  PROT 8.2  ALBUMIN 3.8    CBC:  Lab 02/21/12 1101 02/20/12 0500 02/16/12 1433  WBC 8.0 9.2 --  NEUTROABS -- -- 9.3*  HGB 8.7* 8.2* --  HCT 26.8* 25.3* --  MCV 79.1 79.1 --  PLT 268 275 --   Cardiac Enzymes:  Lab 02/16/12 1744  CKTOTAL --  CKMB --  CKMBINDEX --  TROPONINI <0.30   Hemoglobin A1C:  Lab 02/16/12 1744  HGBA1C 5.9*   Fasting Lipid Panel:  Lab 02/16/12 1744  CHOL 168  HDL 55  LDLCALC 105*  TRIG 42  CHOLHDL 3.1  LDLDIRECT --   Thyroid Function Tests:  Lab 02/16/12 1744  TSH 1.218  T4TOTAL --  FREET4 --  T3FREE --  THYROIDAB --   Coagulation:  Lab 02/16/12 1744  LABPROT  14.3  INR 1.13   Anemia Panel:  Lab 02/16/12 1744  VITAMINB12 467  FOLATE 16.7  FERRITIN 5*  TIBC Not calculated due to Iron <10.  IRON <10*  RETICCTPCT 2.2   Urinalysis:  Lab 02/17/12 0312  COLORURINE YELLOW  LABSPEC 1.019  PHURINE 5.5  GLUCOSEU NEGATIVE  HGBUR NEGATIVE  BILIRUBINUR NEGATIVE  KETONESUR NEGATIVE  PROTEINUR NEGATIVE  UROBILINOGEN 0.2  NITRITE NEGATIVE  LEUKOCYTESUR NEGATIVE   Micro Results: Recent Results (from the past 240 hour(s))  MRSA PCR SCREENING     Status: Normal   Collection Time   02/16/12 11:18 PM      Component Value Range Status Comment   MRSA by PCR NEGATIVE  NEGATIVE Final   CULTURE, BLOOD (ROUTINE X 2)     Status: Normal (Preliminary result)   Collection Time   02/17/12  9:43 AM      Component Value Range Status Comment   Specimen Description BLOOD RIGHT HAND   Final    Special Requests BOTTLES DRAWN AEROBIC AND ANAEROBIC 10CC   Final    Culture  Setup Time  02/17/2012 16:28   Final    Culture     Final    Value:        BLOOD CULTURE RECEIVED NO GROWTH TO DATE CULTURE WILL BE HELD FOR 5 DAYS BEFORE ISSUING A FINAL NEGATIVE REPORT   Report Status PENDING   Incomplete   CULTURE, BLOOD (ROUTINE X 2)     Status: Normal   Collection Time   02/17/12 10:34 AM      Component Value Range Status Comment   Specimen Description BLOOD RIGHT ARM   Final    Special Requests BOTTLES DRAWN AEROBIC AND ANAEROBIC 10CC   Final    Culture  Setup Time 02/17/2012 16:27   Final    Culture     Final    Value: ESCHERICHIA COLI     Note: Gram Stain Report Called to,Read Back By and Verified With: AERIELLE MOHAMMED @0545  ON 02/18/2012 BY MCLET   Report Status 02/20/2012 FINAL   Final    Organism ID, Bacteria ESCHERICHIA COLI   Final    Medications: I have reviewed the patient's current medications. Scheduled Meds:    . amLODipine  5 mg Oral Daily  . cefTRIAXone (ROCEPHIN)  IV  2 g Intravenous Q24H  . metroNIDAZOLE  500 mg Oral Q8H   Continuous Infusions:    . sodium chloride 1,000 mL (02/21/12 2121)   PRN Meds:.guaiFENesin, HYDROcodone-acetaminophen, morphine injection, ondansetron (ZOFRAN) IV, ondansetron Assessment/Plan: Ms. Rickles is a very pleasant 56 year old African American female with PMH of HTN (untreated) who was admitted for abdominal pain and GIB (bright red blood in stool) x 4 months with Hb 6.6 on admission.  S/p 3 PRBC transfusions since admission.  CT A/P: confirmed increased liver lesion, persistent wall thickening of distal ileum compatible with transmural inflammation associated with IBD and wall thickening in sigmoid colon possibly due to underlying mass.  Colonoscopy 02/18/12: + malignant stricture sigmoid colon 15-20cm. S/p liver biopsy per IR 02/19/12.    #Sigmoid Mass concerning for malignancy per colonoscopy on 10/24. Biopsy obtained. Surgery ( Dr Jamey Ripa and Dr. Derrell Lolling) following. CEA elevated to 306. S/p liver biopsy 02/19/12.  Prelim  biopsy results Dr. Hillard--Adenocarcinoma of sigmoid colon, polyps from right ascending colon: tubular adenoma likely non-malignant not high grade dysplasia, liver also carcinoma, immunohistochemistry pending.   - bx colon: Colon, polyp(s), right/ascending - FRAGMENTS OF TUBULAR ADENOMA. -  NO HIGH GRADE DYSPLASIA OR MALIGNANCY IDENTIFIED. Colon, biopsy, sigmoid - POSITIVE FOR ADENOCARCINOMA. - Awaiting official biopsy results from Liver  - Appreciate surgery and gastroenterology assistance - possible surgery for colon and/or liver likely Wed 02/24/12 - f/u CT Chest with contrast--negative, no evidence of mass, lymphadenopathy, or other active disease - will consult oncology  #Acute blood loss anemia with Hb 6.6 on admission likely due to sigmoid mass. S/p 3 PRBC . Hgb stable. One bloody BM reported this morning - Monitor CBC--may need more transfusion prior to surgery to get Hb>=9 as per surgery, 9.4 today  #Abdominal Pain and Diarrhea likely in the setting of sigmoid mass and possible fistula to the small bowel causing inflammatory changes per surgery. Now resolved.  WBC down to 8.0. Blood cx 02/17/12: positive for gram negative rods x1 bottle. Will continue IV abx for now as per Dr. Ninetta Lights, questionable for abscess, if confirmed, will need likely 3 weeks of abx.  - Appreciate GI and Surgery assistance -Continue  Rocephin 2g IV (day 4)and flagyl 500mg  PO TID (day 6) total abx day #6 -morphine PRN   #: Acute Kidney Injury: Cr 1.15 on admission; possibly prerenal secondary to dehydration. Resolved. - Will continue to monitor   #: Hypertension: self reported hx of hypertension many years ago but was never on medication. -continue to monitor   -start norvasc 5mg  qd  #: Liver lesion: Increased in size noted by CT Abdomen from September 2013 to this admission, reviewed with radiology--Increased conspicuity of a lesion in segment 6 of the liver noted, measuring 4.5 x 3.1 cm with an  approximately 1.1 cm low density periphery and a 2.3 cm central portion of higher density than the liver. - liver biopsy obtained by IR on 10/25> await official path report, surgery if needed; prelim bx positive for adenocarcinoma  # Suicidal ideation: After she was given the diagnosis, she was very upset and was noted to have suicidal ideation as per nursing. Was placed on suicide precaution.  She noted that it was devastating news at first and she was really upset. Notes Feeling better since initial news. Currently denies any suicidal ideation or homosuicidal ideation. - Discontinued suicidal precaution--evaluated by Psychiatry yesterday  # Hypokalemia: K 3.3 02/22/12 -replaced -f/u bmet  DVT Px: SCDs  Diet: clear liquid diet until surgery    LOS: 6 days   Darden Palmer 02/22/2012, 8:39 AM

## 2012-02-22 NOTE — Progress Notes (Signed)
Clinical Social Work Department CLINICAL SOCIAL WORK PSYCHIATRY SERVICE LINE ASSESSMENT 02/22/2012  Patient:  [name]  Account:  [id number]  Admit Date:  02/16/2012  Clinical Social Worker:  Ronda Fairly, CLINICAL SOCIAL WORKER  Date/Time:  02/22/2012 10:05 AM Referred by:  Physician  Date referred:  02/19/2012 Reason for Referral  Behavioral Health Issues   Presenting Symptoms/Problems (In the person's/family's own words):   Patient recieved cancer diagnosis last week and had feelings of hopelessness which have passed at this time. Patient hopeful of upcoming surgery tomorrow.   Abuse/Neglect/Trauma History (check all that apply)  Denies history   Abuse/Neglect/Trauma Comments:   Psychiatric History (check all that apply)  Denies history   Psychiatric medications:  Current Mental Health Hospitalizations/Previous Mental Health History:   Current provider:   Place and Date:   Current Medications:   Previous Impatient Admission/Date/Reason:   Emotional Health / Current Symptoms    Suicide/Self Harm  None reported   Suicide attempt in the past:   Other harmful behavior:   Psychotic/Dissociative Symptoms  None reported   Other Psychotic/Dissociative Symptoms:     Other Attention / Behavioral Symptoms:     Other Cognitive Impairment:    Mood and Adjustment  Mood Congruent    Stress, Anxiety, Trauma, Any Recent Loss/Stressor  Other - See comment   Anxiety (frequency):   Phobia (specify):   Compulsive behavior (specify):   Obsessive behavior (specify):   Other:   Upcoming surgery on 02/23/12   Substance Abuse/Use  None   SBIRT completed (please refer for detailed history):    Self-reported substance use:   Urinary Drug Screen Completed:   Alcohol level:    Environmental/Housing/Living Arrangement  Stable housing   Who is in the home:   Emergency contact:  Financial  Stable Employment   Patient's Strengths and Goals (patient's own words):    Patient has stable home of five years.  Lives with daughter and 58 YO granddaughter; patient has close supportive relationship with both. 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   Clinical Social Worker's Interpretive Summary:   Patient presents with appropriate concerns regarding surgery scheduled for 02/23/2012.  Reports tearing is from weeping eye and present with mood congruency as per current circumstances. Appreciative of Financial Counselors who have met with patient regarding insurance status. Introduced option to patient of following up with support groups once she is stable at home following surgery.   Disposition:  Outpatient referral made/needed  [name], LCSWA

## 2012-02-22 NOTE — Progress Notes (Signed)
Internal Medicine Attending  Date: 02/22/2012  Patient name: [name] Medical record number: [id number] Date of birth: 1955-11-05 Age: 56 y.o. Gender: female  I saw and evaluated the patient. I reviewed the resident's note by Dr. Virgina Organ and I agree with the resident's findings and plans as documented in her progress note.  [name] handled the news of her adenocarcinoma relatively well although she did not want to hear any more negative information.  CT scan of the chest was obtained to assess for evidence of malignancy to the chest.  No radiographic evidence of malignancy was seen.  She will need a sigmoid resection (hopefully with an end to end anastomosis) and a liver lesion resection.  In the meantime, we will be more aggressive with her hypertension management and add HCTZ 25 mg PO QAM to her amlodipine 5 mg PO QD.  Oncology will be consulted to address post-op chemotherapy planning.

## 2012-02-22 NOTE — Progress Notes (Signed)
Agree with above.  Awaiting PAth results.  CEA ordered.  Pt will need CT of Chest if not already done.  D/w the patient the timeline, and she understands the plan.

## 2012-02-22 NOTE — Progress Notes (Signed)
PT Note:  Noted new PT order. Spoke with pt, who reports modified independence with all mobility. No needs identified at this time. Encouraged pt to continue ambulation daily. Signing off without formal evaluation complete.  02/22/2012 Cephus Shelling, PT, DPT [phone]

## 2012-02-22 NOTE — Progress Notes (Signed)
Patient Identification:  [name] Date of Evaluation:  02/22/2012 Reason for Consult:Pt c/o abdominal pain; Bx Adenocarcinoma of colon  Referring Provider: Dr. Virgina Organ History of Present Illness:Pt had rectal bleeding , exam revealed Adenocarcinoma of colon. Liver lesion may be metastatic. Past Psychiatric History: Bereavement issues after her mother died. Denies drug use cigarettes alcohol no psychiatric treatment or suicide no thought disorders admitted.   Past Medical History:     Past Medical History  Diagnosis Date  . Hypertension     pt states she stopped taking meds while in her 20's       Past Surgical History  Procedure Date  . Abdominal hysterectomy   . Breast lumpectomy     left breast  . Colonoscopy 02/18/2012    Procedure: COLONOSCOPY;  Surgeon: Theda Belfast, MD;  Location: [facility] ENDOSCOPY;  Service: Endoscopy;  Laterality: N/A;    Allergies: No Known Allergies  Current Medications:  Prior to Admission medications   Medication Sig Start Date End Date Taking? Authorizing Provider  ibuprofen (ADVIL,MOTRIN) 200 MG tablet Take 200-400 mg by mouth every 6 (six) hours as needed. For pain   Yes Historical Provider, MD    Social History:    reports that she has never smoked. She has never used smokeless tobacco. She reports that she drinks alcohol. She reports that she does not use illicit drugs.   Family History:    History reviewed. No pertinent family history.  Mental Status Examination/Evaluation: Objective:  Appearance: Morbidly obese  Psychomotor Activity:  Normal  Eye Contact::  Good  Speech:  Clear and Coherent and Normal Rate  Volume:  Normal  Mood:  Anxious and Dysphoric  Affect:  Congruent and Anxious about men payment to  Thought Process:  Coherent, Relevant and Intact  Orientation:  Full  Thought Content:  Trying to range rent payment the first of the month. 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  AXIS IV economic problems, housing problems, occupational problems, other psychosocial or environmental problems, problems related to social environment and colon cancer with possible Mets  AXIS V 41-50 serious symptoms   Assessment/Plan: Patient is awake and where. She appears calm however she expresses serious distress for trying to contact her landlady. She has money in her possession which is now locked up in security. She needs to retrieve her check it is locked up in order to give it to her daughter who will deliver it to the landlady. Her presentation and the phone calls during this evaluation are focused on this stressful issue. She calmly states that she is going to have surgery in the next day or 2 and she is not he expressed seen a great emotion about this finding. She did say that it was a concern that she was passing black but she had been told many years earlier that she had a fissure and she dismissed the site of blood as to of the pre-existing fissure. Now that she knows she is going to have surgery she says that she will have her family and church family available for support. She is cognitively aware, coherent and organized in plans to get the rent payment arranged before she has surgery. She has no  history of suicidal ideation suicide acts denies drinking or drug use. She has been actively employed and has some family members living with her.  RECOMMENDATION:  1. This patient is coherent and is aware of her medical needs, pending surgery and need to take care of herself during recovery. She has supportive help available after surgery.  2. She is counting on her daughter to arrive Wednesday to received a check for her rent to deliver to her landlady. 3.  Is patient does not express any extreme  apprehension or anxiety related to her diagnosis or the pending surgery. No medication is suggested at this time until surgery is completed and she is in recovery. 4. Will follow this patient Geniece J. Ferol Luz, MD Psychiatrist  02/22/2012 10:37 AM

## 2012-02-22 NOTE — Progress Notes (Signed)
Patient ID: [name], female   DOB: 16-Nov-1955, 56 y.o.   MRN: [id number] 4 Days Post-Op  Subjective: 56 y/o female with primary colon cancer and possible liver metastasis.   Pt is doing well today, not complaining of any abdominal pain, nausea, vomiting, or diarrhea.  Objective: Vital signs in last 24 hours: Temp:  [97.9 F (36.6 C)-98.1 F (36.7 C)] 98 F (36.7 C) (10/28 0450) Pulse Rate:  [68-84] 68  (10/28 0450) Resp:  [18-20] 20  (10/28 0450) BP: (154-181)/(76-94) 167/78 mmHg (10/28 0450) SpO2:  [96 %-100 %] 100 % (10/28 0450) Last BM Date: 02/21/12  Intake/Output from previous day: 10/27 0701 - 10/28 0700 In: 2570 [P.O.:720; I.V.:1800; IV Piggyback:50] Out: -  Intake/Output this shift:    PE: Gen:  Alert, NAD, pleasant Abd: Soft, NT/ND  Lab Results:   Basename 02/21/12 1101 02/20/12 0500  WBC 8.0 9.2  HGB 8.7* 8.2*  HCT 26.8* 25.3*  PLT 268 275   BMET No results found for this basename: NA:2,K:2,CL:2,CO2:2,GLUCOSE:2,BUN:2,CREATININE:2,CALCIUM:2 in the last 72 hours PT/INR No results found for this basename: LABPROT:2,INR:2 in the last 72 hours CMP     Component Value Date/Time   NA 136 02/17/2012 0139   K 4.2 02/17/2012 0139   CL 105 02/17/2012 0139   CO2 19 02/17/2012 0139   GLUCOSE 86 02/17/2012 0139   BUN 12 02/17/2012 0139   CREATININE 0.90 02/17/2012 0139   CALCIUM 9.1 02/17/2012 0139   PROT 8.2 02/16/2012 1433   ALBUMIN 3.8 02/16/2012 1433   AST 14 02/16/2012 1433   ALT 9 02/16/2012 1433   ALKPHOS 77 02/16/2012 1433   BILITOT 0.2* 02/16/2012 1433   GFRNONAA 70* 02/17/2012 0139   GFRAA 81* 02/17/2012 0139   Lipase     Component Value Date/Time   LIPASE 28 01/07/2012 0842       Studies/Results: No results found.  Anti-infectives: Anti-infectives     Start     Dose/Rate Route Frequency Ordered Stop   02/18/12 1400   cefTRIAXone (ROCEPHIN) 2 g in dextrose 5 % 50 mL IVPB        2 g 100 mL/hr over 30 Minutes Intravenous Every 24  hours 02/18/12 1336     02/17/12 1000   ciprofloxacin (CIPRO) tablet 500 mg  Status:  Discontinued     Comments: PLEASE GIVE AFTER BLOOD CULTURES DRAWN      500 mg Oral 2 times daily 02/17/12 0939 02/18/12 1315   02/17/12 1000   metroNIDAZOLE (FLAGYL) tablet 500 mg     Comments: PLEASE GIVE AFTER BLOOD CULTURES DRAWN      500 mg Oral 3 times per day 02/17/12 4098             Assessment/Plan 56 y/o female with Colon cancer and possible liver metastasis 1.  Await liver Biopsy results  2.  Probable sigmoid colectomy and possible Liver resection depending on biopsy results 3.  Cont. Clear liquids 4.  Cont. Ambulation OOB 5.  No need for antibiotics at this time, would recommend discontinuing them per primary service.    LOS: 6 days    DORT, Aundra Millet 02/22/2012, 8:01 AM Pager: [phone]

## 2012-02-23 DIAGNOSIS — C787 Secondary malignant neoplasm of liver and intrahepatic bile duct: Secondary | ICD-10-CM

## 2012-02-23 DIAGNOSIS — R970 Elevated carcinoembryonic antigen [CEA]: Secondary | ICD-10-CM

## 2012-02-23 DIAGNOSIS — D509 Iron deficiency anemia, unspecified: Secondary | ICD-10-CM

## 2012-02-23 DIAGNOSIS — C189 Malignant neoplasm of colon, unspecified: Secondary | ICD-10-CM | POA: Diagnosis present

## 2012-02-23 LAB — CBC
HCT: 27.7 % — ABNORMAL LOW (ref 36.0–46.0)
MCHC: 31.4 g/dL (ref 30.0–36.0)
Platelets: 290 10*3/uL (ref 150–400)
RDW: 16.9 % — ABNORMAL HIGH (ref 11.5–15.5)
WBC: 8 10*3/uL (ref 4.0–10.5)

## 2012-02-23 LAB — BASIC METABOLIC PANEL
Chloride: 103 mEq/L (ref 96–112)
GFR calc Af Amer: 85 mL/min — ABNORMAL LOW (ref >90)
GFR calc non Af Amer: 74 mL/min — ABNORMAL LOW (ref >90)
Potassium: 3.1 mEq/L — ABNORMAL LOW (ref 3.5–5.1)
Sodium: 141 mEq/L (ref 135–145)

## 2012-02-23 LAB — CULTURE, BLOOD (ROUTINE X 2): Culture: NO GROWTH

## 2012-02-23 MED ORDER — POTASSIUM CHLORIDE CRYS ER 20 MEQ PO TBCR
40.0000 meq | EXTENDED_RELEASE_TABLET | Freq: Two times a day (BID) | ORAL | Status: DC
  Administered 2012-02-23 (×2): 40 meq via ORAL
  Filled 2012-02-23 (×4): qty 2

## 2012-02-23 NOTE — Progress Notes (Signed)
Subjective: Ms. Broberg was seen and examined at bedside.  She claims to be feeling the same as yesterday.  She is relieved to find out the CT chest was negative and is looking forward to surgery.  She denies any fever, chills, N/V/D, abdominal pain, chest pain, or shortness of breath.  She had another bloody BM this morning, is tolerating clears, but would like a regular meal.      Objective: Vital signs in last 24 hours: Filed Vitals:   02/22/12 0945 02/22/12 1413 02/22/12 2106 02/23/12 0458  BP: 173/91 164/93 156/74 157/80  Pulse: 69 75 79 77  Temp: 98.4 F (36.9 C) 98.5 F (36.9 C) 98.6 F (37 C) 98.1 F (36.7 C)  TempSrc: Oral Oral Oral Oral  Resp: 18 20 20 18   Height:      Weight:      SpO2: 98% 93% 92% 100%   Weight change:   Intake/Output Summary (Last 24 hours) at 02/23/12 0811 Last data filed at 02/22/12 2300  Gross per 24 hour  Intake 3287.5 ml  Output      0 ml  Net 3287.5 ml   Physical Exam: Constitutional: Vital signs reviewed.  She is in no acute distress and cooperative with exam. Alert and oriented x3.   HEENT: PERRLA, EOMI, [place]/AT, neck supple.  Cardiovascular:  RRR + systolic murmur 2-3/6 loudest at right sternal border Pulmonary/Chest: CTAB, no wheezes, rales, or rhonchi Abdominal: Soft. Non-tender, non-distended, + bowel sounds present Neurological: A&O x3, strength and sensation equal and gross intact in b/l upper and lower extremities.  Skin: warm, dry eczematous patch on left index finger, patch of pink skin on right thumb.  Lab Results: Basic Metabolic Panel:  Lab 02/23/12 0981 02/22/12 0945  NA 141 139  K 3.1* 3.3*  CL 103 104  CO2 27 28  GLUCOSE 101* 96  BUN <3* 3*  CREATININE 0.86 0.85  CALCIUM 9.5 9.6  MG -- --  PHOS -- --   Liver Function Tests:  Lab 02/22/12 0945 02/16/12 1433  AST 14 14  ALT 9 9  ALKPHOS 69 77  BILITOT 0.3 0.2*  PROT 7.8 8.2  ALBUMIN 3.5 3.8    CBC:  Lab 02/23/12 0555 02/22/12 0945 02/16/12 1433  WBC 8.0  7.4 --  NEUTROABS -- 4.8 9.3*  HGB 8.7* 9.4* --  HCT 27.7* 28.1* --  MCV 78.2 79.2 --  PLT 290 282 --   Cardiac Enzymes:  Lab 02/16/12 1744  CKTOTAL --  CKMB --  CKMBINDEX --  TROPONINI <0.30   Hemoglobin A1C:  Lab 02/16/12 1744  HGBA1C 5.9*   Fasting Lipid Panel:  Lab 02/16/12 1744  CHOL 168  HDL 55  LDLCALC 105*  TRIG 42  CHOLHDL 3.1  LDLDIRECT --   Thyroid Function Tests:  Lab 02/16/12 1744  TSH 1.218  T4TOTAL --  FREET4 --  T3FREE --  THYROIDAB --   Coagulation:  Lab 02/16/12 1744  LABPROT 14.3  INR 1.13   Anemia Panel:  Lab 02/16/12 1744  VITAMINB12 467  FOLATE 16.7  FERRITIN 5*  TIBC Not calculated due to Iron <10.  IRON <10*  RETICCTPCT 2.2   Urinalysis:  Lab 02/17/12 0312  COLORURINE YELLOW  LABSPEC 1.019  PHURINE 5.5  GLUCOSEU NEGATIVE  HGBUR NEGATIVE  BILIRUBINUR NEGATIVE  KETONESUR NEGATIVE  PROTEINUR NEGATIVE  UROBILINOGEN 0.2  NITRITE NEGATIVE  LEUKOCYTESUR NEGATIVE   Micro Results: Recent Results (from the past 240 hour(s))  MRSA PCR SCREENING  Status: Normal   Collection Time   02/16/12 11:18 PM      Component Value Range Status Comment   MRSA by PCR NEGATIVE  NEGATIVE Final   CULTURE, BLOOD (ROUTINE X 2)     Status: Normal   Collection Time   02/17/12  9:43 AM      Component Value Range Status Comment   Specimen Description BLOOD RIGHT HAND   Final    Special Requests BOTTLES DRAWN AEROBIC AND ANAEROBIC 10CC   Final    Culture  Setup Time 02/17/2012 16:28   Final    Culture NO GROWTH 5 DAYS   Final    Report Status 02/23/2012 FINAL   Final   CULTURE, BLOOD (ROUTINE X 2)     Status: Normal   Collection Time   02/17/12 10:34 AM      Component Value Range Status Comment   Specimen Description BLOOD RIGHT ARM   Final    Special Requests BOTTLES DRAWN AEROBIC AND ANAEROBIC 10CC   Final    Culture  Setup Time 02/17/2012 16:27   Final    Culture     Final    Value: ESCHERICHIA COLI     Note: Gram Stain Report  Called to,Read Back By and Verified With: AERIELLE MOHAMMED @0545  ON 02/18/2012 BY MCLET   Report Status 02/20/2012 FINAL   Final    Organism ID, Bacteria ESCHERICHIA COLI   Final    Medications: I have reviewed the patient's current medications. Scheduled Meds:    . amLODipine  5 mg Oral Daily  . cefTRIAXone (ROCEPHIN)  IV  2 g Intravenous Q24H  . hydrochlorothiazide  25 mg Oral Daily  . metroNIDAZOLE  500 mg Oral Q8H  . potassium chloride  40 mEq Oral Once  . potassium chloride  40 mEq Oral BID   Continuous Infusions:    . sodium chloride 75 mL/hr at 02/23/12 0610   PRN Meds:.guaiFENesin, HYDROcodone-acetaminophen, iohexol, morphine injection, ondansetron (ZOFRAN) IV, ondansetron Assessment/Plan: Ms. Pankowski is a very pleasant 56 year old African American female with PMH of HTN (untreated) who was admitted for abdominal pain and GIB (bright red blood in stool) x 4 months with Hb 6.6 on admission.  S/p 3 PRBC transfusions since admission.  CT A/P: confirmed increased liver lesion, persistent wall thickening of distal ileum compatible with transmural inflammation associated with IBD and wall thickening in sigmoid colon possibly due to underlying mass.  Colonoscopy 02/18/12: + malignant stricture sigmoid colon 15-20cm, bx confirmed adenocarcinoma. S/p liver biopsy per IR 02/19/12--prelim adenocarcinoma.    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 Derrell Lolling) following. CEA elevated to 306. S/p liver biopsy 02/19/12.  Prelim biopsy results Dr. Merleen Nicely also carcinoma, immunohistochemistry pending.   - bx colon: Colon, polyp(s), right/ascending - FRAGMENTS OF TUBULAR ADENOMA. - NO HIGH GRADE DYSPLASIA OR MALIGNANCY IDENTIFIED. Colon, biopsy, sigmoid - POSITIVE FOR ADENOCARCINOMA. - Awaiting official biopsy results from Liver  - Appreciate surgery and gastroenterology assistance - possible surgery for colon and liver likely Wed 02/24/12 - f/u CT  Chest with contrast--negative, no evidence of mass, lymphadenopathy, or other active disease - f/u oncology--Spoke with Dr. Truett Perna who will see Ms. Leavy Cella later today.   #Acute blood loss anemia with Hb 6.6 on admission likely due to sigmoid mass. S/p 3 PRBC . Hgb stable. One bloody BM reported this morning - Monitor CBC--may need more transfusion prior to surgery to get Hb>=9 as per surgery, 8.7 today  #Abdominal  Pain and Diarrhea likely in the setting of sigmoid mass and possible fistula to the small bowel causing inflammatory changes per surgery. Now resolved.  WBC down to 8.0. Blood cx 02/17/12: positive for gram negative rods x1 bottle. Will continue IV abx for now as per Dr. Ninetta Lights, questionable for abscess, if confirmed, will need likely 3 weeks of abx.  - Appreciate GI and Surgery and oncology assistance - Continue Rocephin 2g IV (day 5)and flagyl 500mg  PO TID (day 7) total abx day #7 - morphine PRN   #: Acute Kidney Injury: Cr 1.15 on admission; Resolved. - Will continue to monitor   #: Hypertension: self reported hx of hypertension many years ago but was never on medication. -continue to monitor   -continue norvasc 5mg  qd and HCTZ 25mg  PO QAM  #: Liver lesion: Increased in size noted by CT Abdomen from September 2013 to this admission, reviewed with radiology--Increased conspicuity of a lesion in segment 6 of the liver noted, measuring 4.5 x 3.1 cm with an approximately 1.1 cm low density periphery and a 2.3 cm central portion of higher density than the liver. - liver biopsy obtained by IR on 10/25> await official path report, surgery following; prelim bx positive for adenocarcinoma  # Suicidal ideation: After she was given the diagnosis, she was very upset and was noted to have suicidal ideation as per nursing. Was placed on suicide precaution.  She noted that it was devastating news at first and she was really upset. Notes Feeling better since initial news. Currently denies any  suicidal ideation or homosuicidal ideation. - Discontinued suicidal precaution--evaluated by Psychiatry  # Hypokalemia: K 3.1 -replaced -continue to monitor  DVT Px: SCDs  Diet: clear liquid diet until surgery    LOS: 7 days   Darden Palmer 02/23/2012, 8:11 AM

## 2012-02-23 NOTE — Consult Note (Signed)
HEMATOLOGY ONCOLOGY CONSULTATION   [name]  DOB: July 15, 1955  MR#: [id number]  CSN#: [id number]    Requesting Physician: Teaching Service  Primary MD:   History of present illness: Colon Cancer       56 y.o. female with a recent diagnosis of colon cancer, asked to see in consultation while in hospital. In review, patient had noticed a 4 month history of bright red blood in stools, initially believed to be related to a anal fissure treated with course of Cipro and Flagyl. On presentation to ED in 12/2011  with unresolving symptoms, workup included CT abdomen and pelvis showing distal small bowel thickening and partial SBO, along with diverticular disease. A small liver lesion had been noted as well, of unknown etiology. Patient refused admission at the time. She presented via EMS on 10/23 with progressive weakness and dizziness. Severe anemia was not ewith Hb 6.6 requiring 2 Units, with a 3rd unit thereafter ( Hb was 9.4 in September 2013)..A new CT of the abdomen and pelvis revealed Increased conspicuity of a lesion in segment 6 of the liver measuring 4.5 x 3.1 cm with an approximately 1.1 cm low density periphery and a 2.3 cm central portion of higher density than the liver. The central portion remains high in density; persistent wall thickening of distal ileum compatible with transmural inflammation associated with IBD and wall thickening in sigmoid colon possibly due to underlying mass. CEA elevated to 306.CT of the chest with contrast was negative for malignancy.   She was admitted for management and further evaluation.Colonoscopy 02/18/12 (Dr. Elnoria Howard) was positive for malignant stricture at  sigmoid colon 15-20cm, biopsy confirmed adenocarcinoma.  She had liver biopsy per IR 02/19/12 confirming  Adenocarcinoma (Dr. Raynald Blend). Immunohistological stains pending.     Status was complicated by situational  depression without suicidal ideation requiring Psychiatry involvement, with stabilization of  symptoms..   No family history of colon cancer. Patient may have had a prior colonoscopy about 10 years ago, but cannot confirm.positive changes in bowel caliber, smaller.  No Melena. Denies prior history of anemia  or being on oral iron. Her prior transfusion was performed during her prior hysterectomy.  We were kindly requested to see the patient with recommendations prior to her discharge.    Past medical history:      Past Medical History  Diagnosis Date  . Hypertension     pt states she stopped taking meds while in her 20's    Past surgical history:      Past Surgical History  Procedure Date  . Abdominal hysterectomy   . Breast lumpectomy     left breast  . Colonoscopy 02/18/2012    Procedure: COLONOSCOPY;  Surgeon: Theda Belfast, MD;  Location[facility] ENDOSCOPY;  Service: Endoscopy;  Laterality: N/A;    Medications:  Prior to Admission:  Prescriptions prior to admission  Medication Sig Dispense Refill  . ibuprofen (ADVIL,MOTRIN) 200 MG tablet Take 200-400 mg by mouth every 6 (six) hours as needed. For pain        NWG:NFAOZHYQMVH, HYDROcodone-acetaminophen, iohexol, morphine injection, ondansetron (ZOFRAN) IV, ondansetron  Allergies: No Known Allergies   Family history:    History reviewed. No pertinent family history.                                  Social history:Single. One daughter and one son in good health. Lives in [place].Never smoked. No recreational drugs or  ETOH. Active in church. 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  Review of systems:  See HPI for significant positives.  Constitutional:  Negative for weight loss. Negative for fever,night sweats, or chills  Eyes: Negative for blurred vision and double vision.  Respiratory: Negative for cough or  hemoptysis Negative Positive for  shortness of breath on admission, improved post transfusion.  Cardiovascular: Negative for chest pain. Negative for palpitations.  GI:  As per HPI. No nausea, vomiting,  diarrhea, or constipation.Continues to have bloody stools, last this am. 1 year history of intermittent abdominal cramps but no severe pain.  ZO:XWRUEAVW for hematuria. No loss of urinary control. No Urinary retention.  Skin: Negative for itching. No rash. No petechia. No bruising.  Neurological: No headaches. No motor or sensory deficits.No confusion.  Psych: situational depression as above.  Allergies:    No Known Allergies   Physical exam:      Filed Vitals:   02/23/12 0902  BP: 150/86  Pulse:   Temp:   Resp:      Body mass index is 45.24 kg/(m^2).   General: 56 y.o. female  in no acute distress A. and O. x3  well-developed and well-nourished.  HEENT: Normocephalic, atraumatic, PERRLA. Oral cavity without thrush or lesions. Neck supple. no thyromegaly, no cervical or supraclavicular adenopathy  Lungs clear bilaterally . No wheezing, rhonchi or rales. No axillary masses. Breasts: not examined. Cardiac regular rate and rhythm normal S1-S2, 2/6 murmur , rubs or gallops Abdomen moderately obese, soft nontender , bowel sounds x4. No HSM. No masses palpable.  GU/rectal: deferred. Extremities no clubbing, no  cyanosis or edema. No bruising or petechial rash Musculoskeletal: no spinal tenderness.  Neuro: Non Focal Lymph nodes: No cervical, supraclavicular, axillary, or inguinal nodes   Lab results:       CBC  Lab 02/23/12 0555 02/22/12 0945 02/21/12 1101 02/20/12 0500 02/19/12 1117 02/16/12 1433  WBC 8.0 7.4 8.0 9.2 10.0 --  HGB 8.7* 9.4* 8.7* 8.2* 8.5* --  HCT 27.7* 28.1* 26.8* 25.3* 26.3* --  PLT 290 282 268 275 264 --  MCV 78.2 79.2 79.1 79.1 78.3 --  MCH 24.6* 26.5 25.7* 25.6* 25.3* --  MCHC 31.4 33.5 32.5 32.4 32.3 --  RDW 16.9* 17.0* 17.2* 17.3* 17.3* --  LYMPHSABS -- 1.8 -- -- -- 1.4  MONOABS -- 0.4 -- -- -- 0.4  EOSABS -- 0.4 -- -- -- 0.0  BASOSABS -- 0.0 -- -- -- 0.0  BANDABS -- -- -- -- -- --     Chemistries   Lab 02/23/12 0555 02/22/12 0945 02/17/12 0139  02/16/12 1433  NA 141 139 136 137  K 3.1* 3.3* 4.2 4.0  CL 103 104 105 102  CO2 27 28 19 23   GLUCOSE 101* 96 86 99  BUN <3* 3* 12 16  CREATININE 0.86 0.85 0.90 1.15*  CALCIUM 9.5 9.6 9.1 9.7  MG -- -- -- --   CEA on 02/18/2012-306.8 Ferritin on 02/16/2012-5 Coagulation profile  Lab 02/16/12 1744  INR 1.13  PROTIME --    Urine Studies No results found for this basename: UACOL:2,UAPR:2,USPG:2,UPH:2,UTP:2,UGL:2,UKET:2,UBIL:2,UHGB:2,UNIT:2,UROB:2,ULEU:2,UEPI:2,UWBC:2,URBC:2,UBAC:2,CAST:2,CRYS:2,UCOM:2,BILUA:2 in the last 72 hours  Studies:      Ct Chest W Contrast  02/22/2012  *RADIOLOGY REPORT*  Clinical Data: Newly diagnosed colon carcinoma.  Elevated CEA level.  CT CHEST WITH CONTRAST  Technique:  Multidetector CT imaging of the chest was performed following the standard protocol during bolus administration of intravenous contrast.  Contrast: 80mL OMNIPAQUE IOHEXOL 300 MG/ML  SOLN  Comparison: None.  Findings: No hilar or mediastinal masses are identified.  No evidence of lymphadenopathy seen elsewhere within the thorax.  No evidence of pleural or pericardial effusion.  Mild pleural - parenchymal scarring is seen in the posterior left lower lobe.  The lungs are otherwise clear.  No evidence of pulmonary infiltrate or mass.  No central endobronchial lesion identified.  No suspicious bone lesions identified.  IMPRESSION: Negative.  No evidence of mass, lymphadenopathy, or other active disease.   Original Report Authenticated By: Danae Orleans, M.D.    Ct Abdomen Pelvis W Contrast  02/16/2012  *RADIOLOGY REPORT*  Clinical Data: Abdomen pain, diarrhea, hx SBO and bowel abscess  CT ABDOMEN AND PELVIS WITH CONTRAST  Technique:  Multidetector CT imaging of the abdomen and pelvis was performed following the standard protocol during bolus administration of intravenous contrast.  Contrast: OMNIPAQUE IOHEXOL 300 MG/ML  SOLN  Comparison: 01/07/2012  Findings: Minimal scarring noted in the  posterior basal segment left lower lobe.  Increased conspicuity of a lesion in segment 6 of the liver noted, measuring 4.5 x 3.1 cm with an approximately 1.1 cm low density periphery and a 2.3 cm central portion of higher density than the liver.  The central portion remains high in density on delayed images.  No additional liver lesions observed.  Small accessory spleen noted.  No perisplenic ascites.  Pancreas unremarkable.  Adrenal glands unremarkable.  The gallbladder and biliary system appear unremarkable.  Small retroperitoneal lymph nodes are not pathologically enlarged by size criteria.  Retroaortic left renal vein noted.  Orally administered contrast extends to the cecum.  Abnormal thickened loop of distal ileum is again observed with poor definition of the bowel wall and adjacent prominent mesenteric stranding on image 60 of series 2.  No extraluminal oral contrast medium.  The proximal segment of the appendix appears unremarkable, with the distal portion extending towards the mesenteric edema and somewhat obscured.  There is also wall thickening of the sigmoid colon adjacent to the thickened loop of ileum.  I do not observe a drainable abscess.  Urinary bladder unremarkable.  Mild sclerosis in the left pubic body noted, and there is sacroiliac joint sclerosis suggesting bilateral sacroiliitis.  Mildly exaggerated lumbar lordosis noted with facet arthropathy. Uterus absent.  IMPRESSION:  1.  Lesion in segment 6 of the liver is mildly hyperdense with a low density rim.  Differential diagnostic considerations include hepatic adenoma, inflammatory pseudotumor/infection, hepatocellular carcinoma, hypervascular metastatic lesion, and peripheral cholangiocarcinoma.  MRI with without contrast using dynamic liver protocol can sometimes help in differentiation.  Biopsy may be warranted. 2.  Persistent marked wall thickening of a loop of distal ileum, with attenuation of the lumen but no obstruction, compatible with  transmural inflammation associated with inflammatory bowel disease or mass.  There is also adjacent wall thickening in the sigmoid colon, possibly due to secondary inflammation or underlying mass. Indistinct mesenteric stranding is present in this vicinity.  No drainable abscess observed. 3.  Bilateral sacroiliitis.   Original Report Authenticated By: Dellia Cloud, M.D.    US Biopsy  02/19/2012  *RADIOLOGY REPORT*  Clinical data:  Sigmoid colon mass.  Liver lesion noted on recent CT.  ULTRASOUND-GUIDED LIVER LESION CORE BIOPSY  Technique and findings: The procedure, risks (including but not limited to bleeding, infection, organ damage), benefits, and alternatives were explained to the patient.  Questions regarding the procedure were encouraged and answered.  The patient understands and consents to the procedure.Survey ultrasound of  the liver was performed and the lesion was localized, and an appropriate skin entry site determined. Operator donned sterile gloves and mask.   Site was marked, prepped with Betadine, draped in usual sterile fashion, infiltrated locally with 1% lidocaine.  Intravenous Fentanyl and Versed were administered as conscious sedation during continuous cardiorespiratory monitoring by the radiology RN, with a total moderate sedation time of 10 minutes.  Under real time ultrasound guidance, a 17 gauge trocar needle was advanced to the margin of the lesion.  Once needle tip position was confirmed, coaxial 18-gauge core biopsy samples were obtained, submitted in formalin to  surgical pathology.  The guide needle was removed. Postprocedure scans show no hematoma or other apparent complication.  The patient tolerated procedure well.  No immediate complication.  IMPRESSION: 1.  Technically successful ultrasound-guided liver lesion core biopsy.   Original Report Authenticated By: Osa Craver, M.D.     Assessment/Plan:56 y.o. female admitted with severe anemia, after a recent  evaluation with findings consistent with colonic and liver lesion which needed further investigation, at which time patient declined admission She returned on 10/23 with worsening of her symptoms as well as increase in size of the lesions. Colonoscopy was positive for adenocarcinoma, with stains pending. Surgery to evaluate as outpatient. We were requested to see the patient with recommendations. Dr. Alcide Evener is to see the patient following this consult with recommendations regarding diagnosis, treatment options and further workup studies.  Thank you for the referral.   [facility] 02/23/2012 I interviewed and examined [name]. I reviewed the abdominal CT and colonoscopy report.  Impression:  1. Metastatic colon cancer presenting with a sigmoid colon mass and isolated liver lesion (by CT)  2. Markedly elevated CEA  3. Severe iron deficiency anemia   She appears to have metastatic colon cancer. The staging evaluation to date reveals oligo metastatic disease confined to an isolated liver lesion. The prognosis for cure in patients undergoing hepatic resection is higher when the liver metastases are discovered later in the disease course. However if an MRI reveals no other evidence of metastatic disease she will be a candidate for resection of the primary and liver metastasis. I will recommend adjuvant systemic therapy (FOLFOX) following definitive surgery.  Recommendations: 1. Surgical planning per Drs. Derrell Lolling and Kathryn 2. Preoperative MRI of the liver if recommended by Dr. Donell Beers 3. Port-A-Cath placement per surgery 4. Her case will be presented at the GI tumor conference and we will arrange for outpatient oncology followup 5. Oral iron replacement 6. Microsatellite instability testing on the colon tumor biopsy to screen for HNPCC  Thank you for this consultation, We will continue following her in the hospital and then as an outpatient.  Mancel Bale, M.D.

## 2012-02-23 NOTE — Progress Notes (Signed)
Clinical Social Work Progress Note PSYCHIATRY SERVICE LINE 02/23/2012  Patient:  [name]  Account:  [id number]  Admit Date:  02/16/2012  Clinical Social Worker:  Ronda Fairly, CLINICAL SOCIAL WORKER  Date/Time:  02/23/2012 12:00 M  Review of Patient  Overall Medical Condition:   Patient stable, awaiting plan for either surgery and/or chemotherapy from several physicians who are consulting.   Participation Level:  Active  Participation Quality  Attentive  Appropriate   Other Participation Quality:   Patient was on telephone when CSW arrived yet ended call and expressed appreciation for visit.   Affect  Appropriate   Cognitive  Appropriate   Reaction to Medications/Concerns:   Patient expressed some anxiety but also shared that through out the process the medical staff has kept her informed and the outlook seems to be improving.   Modes of Intervention  Support  Problem-solving   Summary of Progress/Plan at Discharge   Patient had concern that she be able to obtain her payrool check from secured locker in order for daught to pay rent on Wednesday.  CSW passed request along to nurse, Rosanne Ashing, who will pass of request to tomorrow's nurse. Patient and CSW spoke of the process which patient has been expreiencing and supported her hopeful outlook..  Patient was encouraged to let some light into room and sit up verses lying prone. Patient exhibited sense of humor and later requested CSW say a prayer for her; CSW agreed.  CSW Signing off as no further psyche needs at this time.   [name], LCSWA

## 2012-02-23 NOTE — Progress Notes (Signed)
Internal Medicine Attending  Date: 02/23/2012  Patient name: [name] Medical record number: [id number] Date of birth: 1955/11/21 Age: 56 y.o. Gender: female  I saw and evaluated the patient. I reviewed the resident's note by Dr. Virgina Organ and I agree with the resident's findings and plans as documented in her progress note.  [name] was seen on rounds this afternoon and was in much better spirits. She was relieved to find out she did not have metastatic disease beyond the single liver lesion. Surgery is interested in performing a hemicolectomy with end-to-end anastomosis and liver lesion resection. Oncology has been consulted to comment on postoperative chemotherapy, particularly the specific regimen and timing. We're awaiting a final decision from surgery as to the timing of the operative procedure. We are continuing aggressive supportive therapy with treatment of her hypertension and hypokalemia. Please see Dr. Waynard Reeds note for further details.

## 2012-02-24 LAB — BASIC METABOLIC PANEL
CO2: 26 mEq/L (ref 19–32)
Chloride: 99 mEq/L (ref 96–112)
Creatinine, Ser: 0.89 mg/dL (ref 0.50–1.10)
GFR calc Af Amer: 82 mL/min — ABNORMAL LOW (ref >90)
Potassium: 3.4 mEq/L — ABNORMAL LOW (ref 3.5–5.1)
Sodium: 135 mEq/L (ref 135–145)

## 2012-02-24 LAB — CBC
HCT: 29.2 % — ABNORMAL LOW (ref 36.0–46.0)
MCV: 78.1 fL (ref 78.0–100.0)
RBC: 3.74 MIL/uL — ABNORMAL LOW (ref 3.87–5.11)
RDW: 17.2 % — ABNORMAL HIGH (ref 11.5–15.5)
WBC: 9.4 10*3/uL (ref 4.0–10.5)

## 2012-02-24 MED ORDER — CIPROFLOXACIN HCL 500 MG PO TABS
500.0000 mg | ORAL_TABLET | Freq: Two times a day (BID) | ORAL | Status: DC
  Filled 2012-02-24 (×2): qty 1

## 2012-02-24 MED ORDER — CIPROFLOXACIN HCL 500 MG PO TABS: 500.0000 mg | ORAL_TABLET | Freq: Two times a day (BID) | ORAL | Status: AC

## 2012-02-24 MED ORDER — AMLODIPINE BESYLATE 10 MG PO TABS: 10.0000 mg | ORAL_TABLET | Freq: Every day | ORAL | Status: DC

## 2012-02-24 MED ORDER — CIPROFLOXACIN HCL 500 MG PO TABS: 500.0000 mg | ORAL_TABLET | Freq: Two times a day (BID) | ORAL | Status: DC

## 2012-02-24 MED ORDER — CIPROFLOXACIN HCL 500 MG PO TABS
500.0000 mg | ORAL_TABLET | Freq: Two times a day (BID) | ORAL | Status: DC
  Administered 2012-02-24: 500 mg via ORAL
  Filled 2012-02-24 (×3): qty 1

## 2012-02-24 MED ORDER — AMLODIPINE BESYLATE 10 MG PO TABS
10.0000 mg | ORAL_TABLET | Freq: Every day | ORAL | Status: DC
  Administered 2012-02-24: 10 mg via ORAL
  Filled 2012-02-24: qty 1

## 2012-02-24 MED ORDER — POTASSIUM CHLORIDE CRYS ER 20 MEQ PO TBCR
40.0000 meq | EXTENDED_RELEASE_TABLET | ORAL | Status: DC
  Administered 2012-02-24: 40 meq via ORAL

## 2012-02-24 NOTE — Progress Notes (Signed)
Patient ID: [name], female   DOB: 05-14-1955, 56 y.o.   MRN: [id number] 6 Days Post-Op  Subjective: Pt feels well.  No complaints  Objective: Vital signs in last 24 hours: Temp:  [98.2 F (36.8 C)-98.4 F (36.9 C)] 98.2 F (36.8 C) (10/30 0500) Pulse Rate:  [67-78] 78  (10/30 0500) Resp:  [16-20] 16  (10/30 0500) BP: (148-169)/(68-101) 148/68 mmHg (10/30 0500) SpO2:  [97 %-100 %] 99 % (10/30 0500) Last BM Date: 02/21/12  Intake/Output from previous day: 10/29 0701 - 10/30 0700 In: 720 [P.O.:720] Out: -  Intake/Output this shift:    PE: Abd: soft, NT, ND,   Lab Results:   [facility] 02/23/12 0555 02/22/12 0945  WBC 8.0 7.4  HGB 8.7* 9.4*  HCT 27.7* 28.1*  PLT 290 282   BMET  Basename 02/23/12 0555 02/22/12 0945  NA 141 139  K 3.1* 3.3*  CL 103 104  CO2 27 28  GLUCOSE 101* 96  BUN <3* 3*  CREATININE 0.86 0.85  CALCIUM 9.5 9.6   PT/INR No results found for this basename: LABPROT:2,INR:2 in the last 72 hours CMP     Component Value Date/Time   NA 141 02/23/2012 0555   K 3.1* 02/23/2012 0555   CL 103 02/23/2012 0555   CO2 27 02/23/2012 0555   GLUCOSE 101* 02/23/2012 0555   BUN <3* 02/23/2012 0555   CREATININE 0.86 02/23/2012 0555   CALCIUM 9.5 02/23/2012 0555   PROT 7.8 02/22/2012 0945   ALBUMIN 3.5 02/22/2012 0945   AST 14 02/22/2012 0945   ALT 9 02/22/2012 0945   ALKPHOS 69 02/22/2012 0945   BILITOT 0.3 02/22/2012 0945   GFRNONAA 74* 02/23/2012 0555   GFRAA 85* 02/23/2012 0555   Lipase     Component Value Date/Time   LIPASE 28 01/07/2012 0842       Studies/Results: Ct Chest W Contrast  02/22/2012  *RADIOLOGY REPORT*  Clinical Data: Newly diagnosed colon carcinoma.  Elevated CEA level.  CT CHEST WITH CONTRAST  Technique:  Multidetector CT imaging of the chest was performed following the standard protocol during bolus administration of intravenous contrast.  Contrast: 80mL OMNIPAQUE IOHEXOL 300 MG/ML  SOLN  Comparison: None.  Findings: No  hilar or mediastinal masses are identified.  No evidence of lymphadenopathy seen elsewhere within the thorax.  No evidence of pleural or pericardial effusion.  Mild pleural - parenchymal scarring is seen in the posterior left lower lobe.  The lungs are otherwise clear.  No evidence of pulmonary infiltrate or mass.  No central endobronchial lesion identified.  No suspicious bone lesions identified.  IMPRESSION: Negative.  No evidence of mass, lymphadenopathy, or other active disease.   Original Report Authenticated By: Danae Orleans, M.D.     Anti-infectives: Anti-infectives     Start     Dose/Rate Route Frequency Ordered Stop   02/18/12 1400   cefTRIAXone (ROCEPHIN) 2 g in dextrose 5 % 50 mL IVPB        2 g 100 mL/hr over 30 Minutes Intravenous Every 24 hours 02/18/12 1336     02/17/12 1000   ciprofloxacin (CIPRO) tablet 500 mg  Status:  Discontinued     Comments: PLEASE GIVE AFTER BLOOD CULTURES DRAWN      500 mg Oral 2 times daily 02/17/12 0939 02/18/12 1315   02/17/12 1000   metroNIDAZOLE (FLAGYL) tablet 500 mg  Status:  Discontinued     Comments: PLEASE GIVE AFTER BLOOD CULTURES DRAWN  500 mg Oral 3 times per day 02/17/12 0939 02/24/12 0731           Assessment/Plan  1. 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 Patient has decided to have both a colectomy and liver resection done at the same time as an outpatient.  We will let her have a regular diet today and she is stable for dc home.  I will arrange her outpatient follow up for her and have our office call her with her appointment time.   LOS: 8 days    Hannie Shoe E 02/24/2012, 7:50 AM Pager: [phone]

## 2012-02-24 NOTE — Progress Notes (Signed)
Brief Nutrition Note:   RD pulled to chart for ?unintentional weight loss. RD spoke with pt, pt reports she was on a CL diet for several days and likely lost a few pounds. Other wise her weight has been stable, appetite is good.   Current Diet: Regular PO intake: 100%  No weights since admission.   Chart reviewed, no nutrition interventions warranted at this time. If pt has weight loss during treatment can follow with RD at the cancer center.   Clarene Duke RD, LDN Pager [phone] After Hours pager [phone]

## 2012-02-24 NOTE — Discharge Summary (Signed)
Internal Medicine Teaching [facility] Discharge Note  Name: [name] MRN: [id number] DOB: Apr 07, 1956 56 y.o.  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      ciprofloxacin 500 MG tablet   Commonly known as: CIPRO   Take 1 tablet (500 mg total) by mouth 2 (two) times daily.      ibuprofen 200 MG tablet   Commonly known as: ADVIL,MOTRIN   Take 200-400 mg by mouth every 6 (six) hours as needed. For pain       Disposition and follow-up:   Ms.Kadesha A Slotnick was discharged from [facility] in {Stablecondition.  At the hospital follow up visit please address:  hypokalemia, K 3.4 on day of discharge  anemia--repeat cbc and get iron panel, hb 6.6 on admission with MCV 76-->Hb 9.4 on discharge.    Status of bloody BM and surgery date and pre-op as needed.    HTN management (started on Norvasc in the hospital, will need to titrate or add to management for goal as needed)  Completion of course 14 days of abx ciprofloxacin for Ecoli blood cx x1.    Follow up surgery and oncology.     Follow-up Appointments:     Follow-up Information    Call [facility]. (Call [place] to register at [phone])    Contact information:   Every 3rd Monday 7:00PM to 8:30 PM at [facility]      Follow up with [facility], MD. (our office will call you)    Contact information:   [address] [place] [address] [phone]       Follow up with Dow Adolph, MD. On 03/02/2012. (@245 )    Contact information:   [address] [place] [address] [phone]         Discharge Orders    Future Appointments: Provider: Department: Dept Phone: Center:   03/02/2012 2:45 PM Dow Adolph, MD [facility] [phone] [facility]   03/10/2012 11:00 AM Axel Filler, MD [facility] [phone] None     Future Orders Please Complete By Expires   Diet - low sodium heart healthy      Increase activity slowly      Discharge instructions      Comments:   Please follow up with surgery, oncology, and internal medicine opc as scheduled  Please take BP medication daily  Please take Ciprofloxacin for a total of 14 more days, one tablet twice a day, end date 03/08/12  If bleeding gets worse, please call or come to clinic, f/u with surgery, or go to ED if severe   Call MD for:  severe uncontrolled pain      Call MD for:  persistant nausea and vomiting      Call MD for:  extreme fatigue      Call MD for:  temperature >100.4        Consultations: Oncology, GI, Surgery  Treatment Team:  Md Montez Morita, MD  Procedures Performed:  Ct Chest W Contrast  02/22/2012  *RADIOLOGY REPORT*  Clinical Data: Newly diagnosed colon carcinoma.  Elevated CEA level.  CT CHEST WITH CONTRAST  Technique:  Multidetector CT imaging of the chest was performed following the standard protocol during bolus administration of intravenous contrast.  Contrast: 80mL OMNIPAQUE IOHEXOL 300 MG/ML  SOLN  Comparison: None.  Findings: No hilar or mediastinal masses are identified.  No evidence of lymphadenopathy seen elsewhere within the thorax.  No evidence of pleural or pericardial effusion.  Mild pleural - parenchymal scarring is seen in the posterior left lower lobe.  The lungs are otherwise clear.  No evidence of pulmonary infiltrate or mass.  No central endobronchial lesion identified.  No suspicious bone lesions identified.  IMPRESSION: Negative.  No evidence of  mass, lymphadenopathy, or other active disease.   Original Report Authenticated By: Danae Orleans, M.D.    Ct Abdomen Pelvis W Contrast  02/16/2012  *RADIOLOGY REPORT*  Clinical Data: Abdomen pain, diarrhea, hx SBO and bowel abscess  CT ABDOMEN AND PELVIS WITH CONTRAST  Technique:  Multidetector CT imaging of the abdomen and pelvis was performed following the standard protocol during bolus administration of intravenous contrast.  Contrast: OMNIPAQUE IOHEXOL 300 MG/ML  SOLN  Comparison: 01/07/2012  Findings: Minimal scarring noted in the posterior basal segment left lower lobe.  Increased conspicuity of a lesion in segment 6 of the liver noted, measuring 4.5 x 3.1 cm with an approximately 1.1 cm low density periphery and a 2.3 cm central portion of higher density than the liver.  The central portion remains high in density on delayed images.  No additional liver lesions observed.  Small accessory spleen noted.  No perisplenic ascites.  Pancreas unremarkable.  Adrenal glands unremarkable.  The gallbladder and biliary system appear unremarkable.  Small retroperitoneal lymph nodes are not pathologically enlarged by size criteria.  Retroaortic left renal vein noted.  Orally administered contrast extends to the cecum.  Abnormal thickened loop of distal ileum is again observed with poor definition of the bowel wall and adjacent prominent mesenteric stranding on image 60 of series 2.  No extraluminal oral contrast medium.  The proximal segment of the appendix appears unremarkable, with the distal portion extending towards the mesenteric edema and somewhat obscured.  There is also wall thickening of the sigmoid colon adjacent to the thickened loop of ileum.  I do not observe a drainable abscess.  Urinary bladder unremarkable.  Mild sclerosis in the left pubic body noted, and there is sacroiliac joint sclerosis suggesting bilateral sacroiliitis.  Mildly exaggerated lumbar lordosis noted with facet arthropathy.  Uterus absent.  IMPRESSION:  1.  Lesion in segment 6 of the liver is mildly hyperdense with a low density rim.  Differential diagnostic considerations include hepatic adenoma, inflammatory pseudotumor/infection, hepatocellular carcinoma, hypervascular metastatic lesion, and peripheral cholangiocarcinoma.  MRI with without contrast using dynamic liver protocol can sometimes help in differentiation.  Biopsy may be warranted. 2.  Persistent marked wall thickening of a loop of distal ileum, with attenuation of the lumen but no obstruction, compatible with transmural inflammation associated with inflammatory bowel disease or mass.  There is also adjacent wall thickening in the sigmoid colon, possibly due to secondary inflammation or underlying mass. Indistinct mesenteric stranding is present in this vicinity.  No drainable abscess observed. 3.  Bilateral sacroiliitis.   Original Report Authenticated By: Dellia Cloud, M.D.    US Biopsy  02/19/2012  *RADIOLOGY REPORT*  Clinical data:  Sigmoid colon mass.  Liver lesion noted on recent CT.  ULTRASOUND-GUIDED LIVER LESION CORE BIOPSY  Technique and findings: The procedure, risks (including but not limited to bleeding, infection, organ damage), benefits, and alternatives were explained to the patient.  Questions regarding the procedure were encouraged and answered.  The patient understands and consents to the procedure.Survey ultrasound of the liver was performed and the lesion was localized, and an appropriate skin entry site determined. Operator donned sterile gloves and mask.   Site was marked, prepped with Betadine, draped in usual sterile fashion, infiltrated locally with 1% lidocaine.  Intravenous Fentanyl and Versed were administered as conscious sedation during continuous cardiorespiratory monitoring by the radiology RN, with a total moderate sedation time of 10 minutes.  Under real time ultrasound guidance, a 17 gauge trocar needle was advanced to the  margin of the lesion.  Once needle tip position was confirmed, coaxial 18-gauge core biopsy samples were obtained, submitted in formalin to  surgical pathology.  The guide needle was removed. Postprocedure scans show no hematoma or other apparent complication.  The patient tolerated procedure well.  No immediate complication.  IMPRESSION: 1.  Technically successful ultrasound-guided liver lesion core biopsy.   Original Report Authenticated By: Osa Craver, M.D.    OPERATIVE PROCEDURE REPORT  PATIENT: Annu, Villers MR#: [id number]  BIRTHDATE: March 17, 1956 GENDER: Female  ENDOSCOPIST: Jeani Hawking, MD  ASSISTANT: Olene Craven, technician Oletha Blend,  technician Anthony Sar, RN  PROCEDURE DATE: 02/18/2012  PROCEDURE: Colonoscopy with snare polypectomy and Colonoscopy with  biopsy  ASA CLASS: Class III  INDICATIONS:an abnormal imaging results.  MEDICATIONS: Versed 10 mg IV and Fentanyl 100 mcg IV  DESCRIPTION OF PROCEDURE: After the risks benefits and  alternatives of the procedure were thoroughly explained, informed  consent was obtained. The Pentax Colonoscope [id number]  endoscope was introduced through the anus and advanced to the  terminal ileum which was intubated for a short distance , No  adverse events experienced. The quality of the prep was good. .  The instrument was then slowly withdrawn as the colon was fully  examined.  FINDINGS: Afrom 15-20 cm a 5 cm malignant stricture was encountered.  The adult colonoscope was not able to pass through and then a  pediatric colonoscope was utilized. With gentle manuvering the  colonoscope was able to pass through the malignant stricture. The  TI was intubated and there was no evidence of any inflammation as  noted on the CT scan. A large 1.5-2 cm hepatic flexure polyp was  identified. a total of 5 ml of normal saline was injected into the  base, which lifted the lesion. The polyp was removed in one pass  with a hot snare.  Unfortunateloy the polyp was too large to  suction and it was resected into multiple smaller segments. Left  sided diverticula were identified. During the withdrawal of the  colonoscope, the malignant stricture was encountered again.  Multiple cold biopsies were obtained and the proximal and distal  edges were tattooed with SPOT. No other abnormalities were  identified. The prep was very good given the high grade malignant  stricture. Retroflexed views revealed internal/external  hemorrhoids. The scope was then withdrawn from the patient and  the procedure terminated.  COMPLICATIONS: There were no complications.  IMPRESSION:  1) Malignant stricture from 15-20 cm.  2) Hepatic flexure polyp.  3) Left sided diverticula.  RECOMMENDATIONS:  1) Await biopsy results.  2) Clear liquid diet.  3) Surgical consultation.  4) Check CEA level.  _______________________________  eSigned: Jeani Hawking, MD 02/18/2012 4:02 PM    Admission HPI: Ms. Frerich is a pleasant 56 year old African American female with past medical history of hypertension and diverticular abscess, partial small bowel obstruction, and microperforation (September 2013 treated with course of Cipro and Flagyl) presenting to the emergency room today complaining of severe abdominal pain while at work. Ms. Duncil claims around lunchtime this afternoon she started experiencing severe sharp periumbilical abdominal pain 8/10 in severity, non-radiating, and associated with cold sweat, dizziness, bilateral shoulder pain, and feeling like she was going to faint. She claims she's been having crampy abdominal pain occasionally for at least one year but her last similar episode was last month. Her abdominal pain resolved while in the emergency room. She also endorses feeling tired and sluggish for the past 4 months, difficulty sleeping, and worries quite a lot about her financial troubles. She also claims to have pink/red colored bowel movements for the  past month with her most recent episode of 6 loose bowel movements and crampy abdominal pain yesterday. The blood in her stool is noted more on her toilet paper. She denies any history of hemorrhoids but does claim that she has bouts of constipation or diarrhea in the past. She also denies fever, chills, headaches, chest pain, shortness of breath, or any urinary complaints at this time. In the emergency room she is noted to have a hemoglobin of 6.6 which is down from 9.4 in September 2013. She claims she has had one prior blood transfusion in 2003 when she had a hysterectomy for fibroids. She endorses no other past medical history or any other complaints at this time.  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 Reichle presented with complaints of abdominal pain, fatigue, and hx of pink/red bowel movements x4 months. She was noted to have a Hb of 6.6 on admission.  She was transfused 3 units of PRBCs during her admission.  CT abdomen on admission showed liver lesion, persistent wall thickening of ileum, and adjacent wall thickening in sigmoid colon along with bilateral sacroilitis.  GI was consulted who performed colonoscopy on 02/18/12 and found malignant stricture of sigmoid colon, hepatic flexure polyp, and left sided diverticula.  Colon biopsy confirmed adenocarcinoma, polyp(s), right/ascending positive for fragments of tubular adenoma, no high grade dysplasia or malignancy identified.  Liver biopsy: positive for metastatic adenocarcinoma consistent with colorectal primary, Immunohistochemical stains are performed. The tumor is positive for CDX-2 and cytokeratin 20, while it isnegative for cytokeratin 7 and TTF-1. The morphology coupled with the immunohistochemical staining patternis consistent with the above diagnosis.  Surgery (Dr. Loretha Brasil also consulted who will perform colon resection and liver resection as outpatient.  Oncology (Dr. Truett Perna) was also consulted  during admission and will also follow Ms. Leavy Cella as an outpatient.  Ms. Gaughan was made aware of all the findings, treatment options, risks and benefits throughout her stay which she acknowledged and understood and decided to proceed with outpatient surgery of liver and colon at one time.  She will also be followed up in [facility] for primary care.     Acute blood loss anemia secondary to GIB--noted 4 month hx of pink/red bowel movements and had Hb of 6.6 with MCV of 76.4 on admission.  Likely secondary to colon mass--adenocarcinoma with metastasis to the liver.  She was subsequently transfused 3 units of PRBCs during her admission and was discharged with a Hb of 9.4.  She will follow  up with [facility] and surgery as an outpatient.  Will need to repeat cbc and recommended for iron panel studies as well as outpatient.     HYPERTENSION--Ms. Russman reports a hx of hypertension, however, never started treatment and does not have a primary care physician.  Throughout her hospital admission she was noted to be hypertensive and was eventually started on low dose Norvasc.  HCTZ was then also added to her regimen, however, she was noted to be hypokalemic which was replaced.  Upon discharge, HCTZ was discontinued and norvasc was increased to 10mg  daily which she was able to afford.  She will nee to follow up in [facility] with her PCP to titrate and add management as needed.   Hypokalemia: Noted to be hypokalemic during this admission.  Replaced during hospital course.  Was started on HCTZ for HTN during admission, but discontinued on discharge given hypokalemia.  Kept on Norvasc. Will need to follow up with pcp and as outpatient.    Gram negative bacteremia: x1 positive blood culture Ecoli during admission.  Afebrile.  Sensitive to Ceftriaxone and Ciprofloxacin.  Initially started on flagyl ceftriaxone during admission.  Flagyl was eventually discontinued and she was transitioned to oral ciprofloxacin on discharge for 14 more days.  Will  follow up in [facility].  Surgery and Oncology following as well.      Discharge Vitals:  BP 148/68  Pulse 78  Temp 98.2 F (36.8 C) (Oral)  Resp 16  Ht 5\' 2"  (1.575 m)  Wt 247 lb 5.7 oz (112.2 kg)  BMI 45.24 kg/m2  SpO2 99%  Discharge Labs:  Results for orders placed during the hospital encounter of 02/16/12 (from the past 24 hour(s))  BASIC METABOLIC PANEL     Status: Abnormal   Collection Time   02/24/12  6:20 AM      Component Value Range   Sodium 135  135 - 145 mEq/L   Potassium 3.4 (*) 3.5 - 5.1 mEq/L   Chloride 99  96 - 112 mEq/L   CO2 26  19 - 32 mEq/L   Glucose, Bld 103 (*) 70 - 99 mg/dL   BUN 3 (*) 6 - 23 mg/dL   Creatinine, Ser 0.98  0.50 - 1.10 mg/dL   Calcium 9.8  8.4 - 11.9 mg/dL   GFR calc non Af Amer 71 (*) >90 mL/min   GFR calc Af Amer 82 (*) >90 mL/min  CBC     Status: Abnormal   Collection Time   02/24/12  9:00 AM      Component Value Range   WBC 9.4  4.0 - 10.5 K/uL   RBC 3.74 (*) 3.87 - 5.11 MIL/uL   Hemoglobin 9.4 (*) 12.0 - 15.0 g/dL   HCT 14.7 (*) 82.9 - 56.2 %   MCV 78.1  78.0 - 100.0 fL   MCH 25.1 (*) 26.0 - 34.0 pg   MCHC 32.2  30.0 - 36.0 g/dL   RDW 13.0 (*) 86.5 - 78.4 %   Platelets 323  150 - 400 K/uL   Signed: Darden Palmer 02/26/2012, 8:56 PM   Time Spent on Discharge: 1 hour

## 2012-02-24 NOTE — Plan of Care (Signed)
Problem: Discharge Progression Outcomes Goal: Other Discharge Outcomes/Goals Outcome: Progressing followup for surgeries and cancer center planned.

## 2012-02-24 NOTE — Progress Notes (Signed)
Agree with PA-Osboune's PN Will schedule as outpt.colon resection and liver resection OK for regular diet.

## 2012-02-24 NOTE — Progress Notes (Signed)
Subjective: [name] was seen and examined at bedside.  She has no overnight complaints and feels well and would like to eat a regular meal.  She has decided to get both colectomy and liver resection at the same time which will be arranged as an outpatient per surgery.  Oncology will also arrange outpatient follow up. She denies any fever, chills, N/V/D, abdominal pain, chest pain, or shortness of breath.    Objective: Vital signs in last 24 hours: Filed Vitals:   02/23/12 0902 02/23/12 1534 02/23/12 2100 02/24/12 0500  BP: 150/86 169/101 154/90 148/68  Pulse:  71 67 78  Temp:  98.3 F (36.8 C) 98.4 F (36.9 C) 98.2 F (36.8 C)  TempSrc:  Oral Oral Oral  Resp:  20 18 16   Height:      Weight:      SpO2:  97% 100% 99%   Weight change:   Intake/Output Summary (Last 24 hours) at 02/24/12 0759 Last data filed at 02/24/12 0700  Gross per 24 hour  Intake   3120 ml  Output      0 ml  Net   3120 ml   Physical Exam: Constitutional: Vital signs reviewed.  She is in no acute distress and cooperative with exam. Alert and oriented x3.   HEENT: PERRLA, EOMI, Noank/AT, neck supple.  Cardiovascular:  RRR + systolic murmur 2-3/6 loudest at right sternal border Pulmonary/Chest: CTAB, no wheezes, rales, or rhonchi Abdominal: Soft. Non-tender, non-distended, + bowel sounds present Neurological: A&O x3, strength and sensation equal and gross intact in b/l upper and lower extremities.  Skin: warm, dry eczematous patch on left index finger, patch of pink skin on right thumb-nontender.  Lab Results: Basic Metabolic Panel:  Lab 02/24/12 0960 02/23/12 0555  NA 135 141  K 3.4* 3.1*  CL 99 103  CO2 26 27  GLUCOSE 103* 101*  BUN 3* <3*  CREATININE 0.89 0.86  CALCIUM 9.8 9.5  MG -- --  PHOS -- --   Liver Function Tests:  Lab 02/22/12 0945  AST 14  ALT 9  ALKPHOS 69  BILITOT 0.3  PROT 7.8  ALBUMIN 3.5    CBC:  Lab 02/23/12 0555 02/22/12 0945  WBC 8.0 7.4  NEUTROABS -- 4.8  HGB 8.7*  9.4*  HCT 27.7* 28.1*  MCV 78.2 79.2  PLT 290 282   Micro Results: Recent Results (from the past 240 hour(s))  MRSA PCR SCREENING     Status: Normal   Collection Time   02/16/12 11:18 PM      Component Value Range Status Comment   MRSA by PCR NEGATIVE  NEGATIVE Final   CULTURE, BLOOD (ROUTINE X 2)     Status: Normal   Collection Time   02/17/12  9:43 AM      Component Value Range Status Comment   Specimen Description BLOOD RIGHT HAND   Final    Special Requests BOTTLES DRAWN AEROBIC AND ANAEROBIC 10CC   Final    Culture  Setup Time 02/17/2012 16:28   Final    Culture NO GROWTH 5 DAYS   Final    Report Status 02/23/2012 FINAL   Final   CULTURE, BLOOD (ROUTINE X 2)     Status: Normal   Collection Time   02/17/12 10:34 AM      Component Value Range Status Comment   Specimen Description BLOOD RIGHT ARM   Final    Special Requests BOTTLES DRAWN AEROBIC AND ANAEROBIC 10CC   Final  Culture  Setup Time 02/17/2012 16:27   Final    Culture     Final    Value: ESCHERICHIA COLI     Note: Gram Stain Report Called to,Read Back By and Verified With: AERIELLE MOHAMMED @0545  ON 02/18/2012 BY MCLET   Report Status 02/20/2012 FINAL   Final    Organism ID, Bacteria ESCHERICHIA COLI   Final    Medications: I have reviewed the patient's current medications. Scheduled Meds:    . amLODipine  5 mg Oral Daily  . cefTRIAXone (ROCEPHIN)  IV  2 g Intravenous Q24H  . hydrochlorothiazide  25 mg Oral Daily  . potassium chloride  40 mEq Oral BID  . DISCONTD: metroNIDAZOLE  500 mg Oral Q8H   Continuous Infusions:    . sodium chloride 75 mL/hr (02/24/12 0607)   PRN Meds:.guaiFENesin, HYDROcodone-acetaminophen, morphine injection, ondansetron (ZOFRAN) IV, ondansetron  Assessment/Plan: [name] is a very pleasant 57 year old African American female with PMH of HTN (untreated) who was admitted for abdominal pain and GIB (bright red blood in stool) x 4 months with Hb 6.6 on admission.  S/p 3 PRBC  transfusions since admission.  CT A/P: confirmed increased liver lesion, persistent wall thickening of distal ileum compatible with transmural inflammation associated with IBD and wall thickening in sigmoid colon possibly due to underlying mass.  Colonoscopy 02/18/12: + malignant stricture sigmoid colon 15-20cm, bx confirmed adenocarcinoma. S/p liver biopsy per IR 02/19/12--prelim adenocarcinoma.    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 Derrell Lolling) following. CEA elevated to 306. S/p liver biopsy 02/19/12.  Prelim biopsy results Dr. Merleen Nicely also carcinoma, immunohistochemistry pending.   - bx colon: Colon, polyp(s), right/ascending - FRAGMENTS OF TUBULAR ADENOMA. - NO HIGH GRADE DYSPLASIA OR MALIGNANCY IDENTIFIED. Colon, biopsy, sigmoid - POSITIVE FOR ADENOCARCINOMA. - Awaiting official biopsy results from Liver  - Appreciate surgery and gastroenterology assistance--will arrange outpatient surgery colectomy and liver resection.  Dr. Derrell Lolling and Dr. Donell Beers on board. - f/u CT Chest with contrast--negative, no evidence of mass, lymphadenopathy, or other active disease - f/u oncology--Dr. Truett Perna on board and will follow up outpatient  #Acute blood loss anemia with Hb 6.6 on admission likely due to sigmoid mass. S/p 3 PRBC . Hgb stable. 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 Now resolved.  WBC down to 8.0. Blood cx 02/17/12: positive for gram negative rods x1 bottle. Will continue IV abx for now as per Dr. Ninetta Lights, questionable for abscess, if confirmed, will need likely 3 weeks of abx.  - Appreciate GI and Surgery and oncology assistance - Discontinue flagyl.  Change Rocephin to Ciprofloxacin total abx day #8 -  morphine PRN   #: Acute Kidney Injury: Cr 1.15 on admission; Resolved. - Will continue to monitor   #: Hypertension: self reported hx of hypertension many years ago but was never on medication. -continue to monitor    -discontinue HCTZ, increase Norvasc to 10mg  qd   #: Liver lesion: Increased in size noted by CT Abdomen from September 2013 to this admission, reviewed with radiology--Increased conspicuity of a lesion in segment 6 of the liver noted, measuring 4.5 x 3.1 cm with an approximately 1.1 cm low density periphery and a 2.3 cm central portion of higher density than the  liver. - liver biopsy obtained by IR on 10/25> await official path report, surgery following; prelim bx positive for adenocarcinoma  # Suicidal ideation: After she was given the diagnosis, she was very upset and was noted to have suicidal ideation as per nursing. Was placed on suicide precaution.  She noted that it was devastating news at first and she was really upset. Notes Feeling better since initial news. Currently denies any suicidal ideation or homosuicidal ideation. - Discontinued suicidal precaution--evaluated by Psychiatry  # Hypokalemia: K 3.4 -replace -continue to monitor   DVT Px: SCDs  Diet: regular Dispo: d/c home today, follow up opc, surgery, oncology   LOS: 8 days   Darden Palmer 02/24/2012, 7:59 AM

## 2012-02-26 DIAGNOSIS — E876 Hypokalemia: Secondary | ICD-10-CM | POA: Diagnosis not present

## 2012-02-26 DIAGNOSIS — R7881 Bacteremia: Secondary | ICD-10-CM | POA: Diagnosis present

## 2012-02-29 ENCOUNTER — Telehealth (INDEPENDENT_AMBULATORY_CARE_PROVIDER_SITE_OTHER): Payer: Self-pay | Admitting: General Surgery

## 2012-02-29 ENCOUNTER — Other Ambulatory Visit (INDEPENDENT_AMBULATORY_CARE_PROVIDER_SITE_OTHER): Payer: Self-pay | Admitting: General Surgery

## 2012-02-29 NOTE — Telephone Encounter (Signed)
Told pt's daughter to be here 30 mins prior to appt

## 2012-02-29 NOTE — Telephone Encounter (Signed)
Called to let patient know appt date and time.Marland KitchenMarland KitchenAppt r/s from Dr.Ramirez schd per Dr.Byerly.Marland KitchenMarland KitchenAppt 03/11/12 @11 :30...spoke with daughter Corliss Skains [phone]

## 2012-03-01 ENCOUNTER — Other Ambulatory Visit (INDEPENDENT_AMBULATORY_CARE_PROVIDER_SITE_OTHER): Payer: Self-pay | Admitting: General Surgery

## 2012-03-02 ENCOUNTER — Encounter: Payer: Self-pay | Admitting: Internal Medicine

## 2012-03-02 ENCOUNTER — Ambulatory Visit (INDEPENDENT_AMBULATORY_CARE_PROVIDER_SITE_OTHER): Payer: Self-pay | Admitting: Internal Medicine

## 2012-03-02 VITALS — BP 140/79 | HR 91 | Temp 98.3°F | Ht 70.0 in | Wt 241.6 lb

## 2012-03-02 DIAGNOSIS — R7881 Bacteremia: Secondary | ICD-10-CM

## 2012-03-02 DIAGNOSIS — E876 Hypokalemia: Secondary | ICD-10-CM

## 2012-03-02 DIAGNOSIS — I10 Essential (primary) hypertension: Secondary | ICD-10-CM

## 2012-03-02 DIAGNOSIS — D509 Iron deficiency anemia, unspecified: Secondary | ICD-10-CM

## 2012-03-02 DIAGNOSIS — B9689 Other specified bacterial agents as the cause of diseases classified elsewhere: Secondary | ICD-10-CM

## 2012-03-02 DIAGNOSIS — E611 Iron deficiency: Secondary | ICD-10-CM | POA: Insufficient documentation

## 2012-03-02 DIAGNOSIS — C189 Malignant neoplasm of colon, unspecified: Secondary | ICD-10-CM

## 2012-03-02 LAB — CBC
Hemoglobin: 8.7 g/dL — ABNORMAL LOW (ref 12.0–15.0)
MCH: 24.2 pg — ABNORMAL LOW (ref 26.0–34.0)
RBC: 3.59 MIL/uL — ABNORMAL LOW (ref 3.87–5.11)
WBC: 9.5 10*3/uL (ref 4.0–10.5)

## 2012-03-02 MED ORDER — FERROUS SULFATE 325 (65 FE) MG PO TABS: 325.0000 mg | ORAL_TABLET | Freq: Three times a day (TID) | ORAL | Status: DC

## 2012-03-02 NOTE — Assessment & Plan Note (Signed)
Blood pressure is 140/79. She reports compliance with amlodipine.  Plan -Continue with amlodipine 10 mg once daily. -Will follow blood pressure within one month and a sugar that is well controlled preoperatively.

## 2012-03-02 NOTE — Patient Instructions (Addendum)
Please start taking Iron Sulfate at 325 mg three time a day with food. This medication is available over the counter for $6 for 100 tablets. I called the [facility] pharmacy on [address] for you and they have it.  Pleas continue with your Ciprofloxacin and Amlodipine Please go for your appointments with Dr Derrell Lolling and Dr Donell Beers as scheduled on the 12/6 and 12/16 I will contact you in regard to the appointment with Dr Truett Perna Please come for you appointment with the financial counselor on 03/08/2012 at 1:30pm here at the clinic I will see you again here at the clinic in 1 month.

## 2012-03-02 NOTE — Progress Notes (Signed)
Patient ID: [name], female   DOB: Dec 21, 1955, 56 y.o.   MRN: [id number]  Subjective:   Patient ID: [name] female   DOB: 01-22-1956 56 y.o.   MRN: [id number]  HPI: [name] is a 56 y.o. recently diagnosed with metastatic adenocarcinoma of the colon presents today for hospital follow up visit. She also was found to have hypertension, and iron deficiency anemia.  The patient reports no complaints. She feels well. She continues to comply with amlodipine for the treatment of high blood pressure and the ciprofloxacin prescribed for blood cultures that grew Escherichia coli.  [name] is scheduled to undergo resection for the tumor in her colon on 04/11/2012. She has upcoming appointment for preoperative assessment on 04/01/2012. She is aware of all her appointments and she is encouraged to keep them.  Of note, she has significant financial needs and she is concerned about, how she'll afford her medications. She has an upcoming appointment with our financial counselor on the 12th of November 2013 at 1:30 PM.    Past Medical History  Diagnosis Date  . Hypertension     pt states she stopped taking meds while in her 20's   Current Outpatient Prescriptions  Medication Sig Dispense Refill  . amLODipine (NORVASC) 10 MG tablet Take 1 tablet (10 mg total) by mouth daily.  30 tablet  1  . ciprofloxacin (CIPRO) 500 MG tablet Take 1 tablet (500 mg total) by mouth 2 (two) times daily.  27 tablet  0  . ferrous sulfate 325 (65 FE) MG tablet Take 1 tablet (325 mg total) by mouth 3 (three) times daily with meals.  90 tablet  1   No family history on file. History   Social History  . Marital Status: Single    Spouse Name: N/A    Number of Children: N/A  . Years of Education: N/A   Social History Main Topics  . Smoking status: Never Smoker   . Smokeless tobacco: Never Used  . Alcohol Use: No  . Drug Use: No  . Sexually Active: None   Other Topics Concern  . None   Social  History Narrative  . None   Review of Systems: Review of Systems  Constitutional: Negative for fever, chills, weight loss, malaise/fatigue and diaphoresis.  HENT: Negative.   Eyes: Negative.   Respiratory: Negative.   Cardiovascular: Negative.   Gastrointestinal: Negative for heartburn, nausea, blood in stool and melena.  Genitourinary: Negative for dysuria and urgency.  Musculoskeletal: Negative.   Skin: Negative.   Neurological: Negative.  Negative for weakness.  Endo/Heme/Allergies: Negative.   Psychiatric/Behavioral: Negative.    Objective:  Physical Exam: Filed Vitals:   03/02/12 1419  BP: 140/79  Pulse: 91  Temp: 98.3 F (36.8 C)  TempSrc: Oral  Height: 5\' 10"  (1.778 m)  Weight: 241 lb 9.6 oz (109.589 kg)  SpO2: 100%   Physical Exam  Vitals reviewed. Constitutional: She is oriented to person, place, and time. She appears well-developed and well-nourished. No distress.  HENT:  Head: Normocephalic and atraumatic.  Eyes: EOM are normal. Pupils are equal, round, and reactive to light.  Cardiovascular: Normal rate and regular rhythm.  Exam reveals no gallop and no friction rub.   No murmur heard. Pulmonary/Chest: Effort normal. No respiratory distress. She has no wheezes. She has no rales. She exhibits no tenderness.  Abdominal: Soft. Bowel sounds are normal. She exhibits no distension and no mass. There is no tenderness. There is no rebound and  no guarding.  Musculoskeletal: Normal range of motion. She exhibits no edema and no tenderness.  Neurological: She is alert and oriented to person, place, and time.  Skin: Skin is warm and dry. She is not diaphoretic.    Assessment & Plan:  The assessment and plan for the care of this patient has been discussed with Dr. Eben Burow as detailed under problem.  In brief, [name] will be started on iron supplements, and she's been encouraged to complete her course of ciprofloxacin. We will also continue to manage her blood pressure  and other general medical needs in preparation for the surgery in December. She will continue with amlodipine at 10 mg once daily. She'll have a repeat CBC and basic metabolic panel to followup the anemia, and hypokalemia. I will continue to coordinate her care with  general surgery surgical oncologist, and the oncologist for the planned operation on 04/11/2012. The patient has been scheduled to see our Financial counselor for assistance as [name] does not have health insurance, and she does not have a job to afford healthcare. I will see her in one month.

## 2012-03-02 NOTE — Assessment & Plan Note (Signed)
Ms. Helmes had an incidental finding of hypokalemia of 3.4 on the day. She was discharged from the hospital.  Plan  -ordered a basic metabolic panel for a recheck.

## 2012-03-02 NOTE — Assessment & Plan Note (Signed)
[name] was discharged on oral ciprofloxacin for 14 days following blood cultures that grew Escherichia coli on 02/17/2012. She denies history of fever or chills since she was discharged. She reports compliance with her ciprofloxacin. I have encouraged her to continue and complete the full course of antibiotics. I strongly feel that this infection has been well treated.

## 2012-03-02 NOTE — Assessment & Plan Note (Signed)
Recent iron panel revealed low iron of less than 10 and a ferritin level of 5. Hemoglobin at discharge was 9.4.  Will start ferrous sulfate at 325 mg 3 times daily. This medication is available over-the-counter for $6 for 100 tablets. I have given this information to [name] and she is able to afford it.

## 2012-03-03 LAB — BASIC METABOLIC PANEL
Calcium: 9.9 mg/dL (ref 8.4–10.5)
Chloride: 105 mEq/L (ref 96–112)
Creat: 0.89 mg/dL (ref 0.50–1.10)

## 2012-03-07 ENCOUNTER — Telehealth: Payer: Self-pay | Admitting: Internal Medicine

## 2012-03-07 NOTE — Telephone Encounter (Signed)
Telephone Call Addendum:  I called [name] this evening just to check up on her and see how she is doing since her recent hospitalization with our team.  She was also seen by Dr. Zada Girt in opc for hospital follow up.  [name] is in good spirits and is looking forward to her surgery in December.  She has no current complaints and was happy to hear from me.  We will continue to follow her in [facility].

## 2012-03-08 ENCOUNTER — Telehealth (INDEPENDENT_AMBULATORY_CARE_PROVIDER_SITE_OTHER): Payer: Self-pay

## 2012-03-08 ENCOUNTER — Telehealth: Payer: Self-pay | Admitting: Internal Medicine

## 2012-03-08 ENCOUNTER — Encounter (HOSPITAL_COMMUNITY): Payer: Self-pay

## 2012-03-08 NOTE — Telephone Encounter (Signed)
Patient called in stating she is scheduled for a port a cath placement. She says she does not want this done, she would rather get treatment some other way because she does not want "something in her". I told her to talk to her oncologist but she states she does not have one. After speaking with Cyndra Numbers I told patient we would call her back after talking to Dr. Donell Beers.

## 2012-03-09 ENCOUNTER — Other Ambulatory Visit: Payer: Self-pay | Admitting: *Deleted

## 2012-03-09 NOTE — Telephone Encounter (Signed)
I spoke with the patient on 11/12 to try and reassure her about what to expect from Dr. Arita Miss consult and Dr. Truett Perna regarding her upcoming surgery and treatment.  She was uneasy about her [facility] placement surgery.  I explained that Dr. Donell Beers would go over all details at her appt on 03/11/12.  She expressed appreciation and said she felt better.

## 2012-03-10 ENCOUNTER — Encounter (INDEPENDENT_AMBULATORY_CARE_PROVIDER_SITE_OTHER): Payer: Self-pay | Admitting: General Surgery

## 2012-03-10 ENCOUNTER — Telehealth: Payer: Self-pay | Admitting: Oncology

## 2012-03-10 NOTE — Telephone Encounter (Signed)
S/W pt and dtr in re [facility] F?uappt 11/18 @ 9 w/Dr. Truett Perna. Welcome packet mailed.

## 2012-03-10 NOTE — Telephone Encounter (Signed)
Reviewed chart. 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 As long as no sxs, she does not need a refill.

## 2012-03-11 ENCOUNTER — Encounter (INDEPENDENT_AMBULATORY_CARE_PROVIDER_SITE_OTHER): Payer: Self-pay | Admitting: General Surgery

## 2012-03-11 ENCOUNTER — Ambulatory Visit (INDEPENDENT_AMBULATORY_CARE_PROVIDER_SITE_OTHER): Payer: Self-pay | Admitting: General Surgery

## 2012-03-11 ENCOUNTER — Encounter (HOSPITAL_COMMUNITY): Payer: Self-pay | Admitting: *Deleted

## 2012-03-11 VITALS — BP 150/78 | HR 88 | Temp 97.6°F | Resp 20 | Ht 70.0 in | Wt 239.0 lb

## 2012-03-11 DIAGNOSIS — C189 Malignant neoplasm of colon, unspecified: Secondary | ICD-10-CM

## 2012-03-11 DIAGNOSIS — C787 Secondary malignant neoplasm of liver and intrahepatic bile duct: Secondary | ICD-10-CM

## 2012-03-11 NOTE — Patient Instructions (Signed)
Whatever the operative findings, I will discuss the case with your family after we are done in the operating room.  I will talk to you in the next few days when you are more awake.  I will see you in the hospital every weekday that I am not out of town.  My partners help see patients on the weekends and if I am out of town.    WHAT HAPPENS AFTER SURGERY: After surgery, you will go first to the recovery room, then to the ICU for careful monitoring.  It is important that I am able to know your vital signs and urine output to see how you are doing.  Depending on many factors, you may be in the ICU overnight, or for several days.  You will have many tubes and lines in place which is standard for this operation.  You will have several IVs, including possibly a central line in your chest or neck.  You will likely have an arterial line in your wrist.  You will have a tube in your nose for 4-5 days in order to suction out the stomach and liver secretions to help the surgical connections heal.  YOU WILL NOT BE ABLE TO EAT FOR AT LEAST 4-5 DAYS AFTER SURGERY.  You will have a catheter in your bladder.  On your abdomen, you will have several surgical drains and possibly a pain pump with numbing medicine.  You will have compression stockings on your legs to decrease the risk of blood clots.    Sometimes anesthesia makes a decision that you may need to be left on the breathing machine for a period after surgery.  This may be based on your overall health status, or events during surgery.    We will address your pain in several ways.  We will use an IV pain pump called a "PCA," or Patient Controlled Analgesia.  This allows you to press a button and immediately receive a dose of pain medication without waiting for a nurse.  We also use IV Tylenol and sometimes IV Toradol which is similar to ibuprofen.  You may also have a pump with numbing medicine delivered directly to your incision.  I use doses and medications that work  for the majority of people, but you may need an adjustment to the dose or type of medicine if your pain is not adequately controlled.  Your throat may be sore, in which case you may need a throat spray or lozenges.    We will ask you to get out of bed the day after surgery in order to maximize your chances of not having complications.  Your risk of pneumonia and blood clots is lower with walking and sitting in the chair.  We will also ask you to perform breathing exercises.  We will also ask to you walk in your room and in the halls for the above reasons, but also in order for you to keep up your strength.    EATING: We will usually start you on clear liquids in around 2 days if your bowel function seems to have returned.  We advance your diet slowly to make sure you are tolerating each step.  All patients do not have a normal appetite when they go home and usually have to take 2-4 cans of nutritional supplement per day while this is improving.  Most patients also find that their taste buds do not seem the same right after surgery, and this can continue into the time   of possible post operative chemotherapy and radiation.  Some patients develop diabetes and will need assistance from a primary care doctor for medication.     GOING HOME! Usually you are able to go home in 5-10 days, depending on whether or not complications happen and what is going on with your overall health status.   If you have more health problems or if you have limited help at home, the therapists and nurses may recommend a temporary rehab or nursing facility to help you get back on your feet before you go home.  These decisions would be made while you are in the hospital with the assistance of a social worker or case manager.    Please bring all insurance/disability forms to our office for the staff to fill out   POSSIBLE COMPLICATIONS This is a very extensive operation and includes complications listed below: Bleeding Infection  and possible wound complications such as hernia Damage to adjacent structures Leak of bile from the surface of the liver Possible need for other procedures, such as abscess drains in radiology or endoscopy.   Possible prolonged hospital stay MOST PATIENTS' ENERGY LEVEL IS NOT BACK TO NORMAL FOR AT LEAST 4-6 MONTHS.  OLDER PATIENTS MAY FEEL WEAK FOR LONGER PERIODS OF TIME.   Difficulty with eating or post operative nausea (around 30%) Possible early recurrence of cancer Possible complications of your medical problems such as heart disease or arrhythmias. Death (less than 2%)  All possible complications are not listed, just the most common.    FURTHER INFORMATION? Please ask questions if you find something that we did not discuss in the office and would like more information.  If you would like another appointment if you have many questions or if your family members would like to come as well, please contact the office.     IF YOU ARE TAKING ASPIRIN, PLAVIX, COUMADIN, OR OTHER BLOOD THINNERS, LET US KNOW IMMEDIATELY SO WE CAN CONTACT YOUR PRESCRIBING HEALTH CARE PROVIDER TO HOLD THE MEDICATION FOR 5-7 DAYS BEFORE SURGERY  

## 2012-03-11 NOTE — Progress Notes (Signed)
HISTORY: It is a 56 year old female who was admitted to the hospital for anemia earlier this month. She was diagnosed with a sigmoid colon cancer. Staging studies revealed a liver mass. The liver mass is posterior right. She is in good health overall and would like to have both of these resected at the same time. I have this scheduled with Dr. Derrell Lolling. She is here to review surgery and to review the overall plan of care. She has not had any further lightheadedness since her discharge. She is back at work. She denies fevers and chills. She has not had any significant rectal bleeding.   PERTINENT REVIEW OF SYSTEMS: Otherwise negative.    Of note, she has had hysterectomy   EXAM: Head: Normocephalic and atraumatic.  Eyes:  Conjunctivae are normal. Pupils are equal, round, and reactive to light. No scleral icterus.  Neck:  Normal range of motion. Neck supple. No tracheal deviation present. No thyromegaly present.  Resp: No respiratory distress, normal effort. Abd:  Abdomen is soft, non distended and non tender. No masses are palpable.  There is no rebound and no guarding. No upper abdominal incisions.   Neurological: Alert and oriented to person, place, and time. Coordination normal.  Skin: Skin is warm and dry. No rash noted. No diaphoretic. No erythema. No pallor.  Psychiatric: Normal mood and affect. Normal behavior. Judgment and thought content normal.      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 Will try to do this laparoscopically.  The procedure was discussed with the patient.  The alternatives were discussed, including doing nothing.  The patient elects to undergo partial hepatectomy.  The procedure was described to the patient including the incision, surgical technique, goals of surgery, post op period and recovery time.  We discussed lifting and driving restrictions as well as time off work.  The complications  were described to the patient.  The complications below were discussed, as well as the fact that unpredicted complications could occur.  This will occur in conjunction with sigmoid colectomy by Dr. Derrell Lolling   Bleeding, possibly requiring a blood transfusion. Infection and possible wound complications such as hernia Possible abortion of the operation due to cirrhosis. Damage to adjacent structures Leak of bile from the surface of the liver Possible need for other procedures, such as abscess drains in radiology or endoscopy.   Possible prolonged hospital stay Possible need for ICU stay including time on the ventilator. Decreased energy level.    Possible early recurrence of cancer Possible complications of your medical problems such as heart disease or arrhythmias. Death (less than 2%)           Maudry Diego, MD Surgical Oncology, General & Endocrine Surgery [facility]  Dow Adolph, MD Default, Provider, MD

## 2012-03-11 NOTE — Assessment & Plan Note (Addendum)
Plan partial hepatectomy and cholecystectomy. Will try to do this laparoscopically.  The procedure was discussed with the patient.  The alternatives were discussed, including doing nothing.  The patient elects to undergo partial hepatectomy.  The procedure was described to the patient including the incision, surgical technique, goals of surgery, post op period and recovery time.  We discussed lifting and driving restrictions as well as time off work.  The complications were described to the patient.  The complications below were discussed, as well as the fact that unpredicted complications could occur.  This will occur in conjunction with sigmoid colectomy by Dr. Derrell Lolling   Bleeding, possibly requiring a blood transfusion. Infection and possible wound complications such as hernia Possible abortion of the operation due to cirrhosis. Damage to adjacent structures Leak of bile from the surface of the liver Possible need for other procedures, such as abscess drains in radiology or endoscopy.   Possible prolonged hospital stay Possible need for ICU stay including time on the ventilator. Decreased energy level.    Possible early recurrence of cancer Possible complications of your medical problems such as heart disease or arrhythmias. Death (less than 2%)

## 2012-03-14 ENCOUNTER — Ambulatory Visit (HOSPITAL_COMMUNITY): Payer: Medicaid Other | Admitting: Anesthesiology

## 2012-03-14 ENCOUNTER — Encounter (HOSPITAL_COMMUNITY)
Admission: RE | Disposition: A | Discharge status: Home / Self Care | Payer: Self-pay | Source: Ambulatory Visit | Attending: General Surgery

## 2012-03-14 ENCOUNTER — Inpatient Hospital Stay: Payer: Self-pay | Admitting: Oncology

## 2012-03-14 ENCOUNTER — Ambulatory Visit: Payer: Self-pay

## 2012-03-14 ENCOUNTER — Ambulatory Visit (HOSPITAL_COMMUNITY)
Admission: RE | Admit: 2012-03-14 | Discharge: 2012-03-14 | Disposition: A | Discharge status: Home / Self Care | Payer: Medicaid Other | Source: Ambulatory Visit | Attending: General Surgery | Admitting: General Surgery

## 2012-03-14 ENCOUNTER — Encounter (HOSPITAL_COMMUNITY): Payer: Self-pay | Admitting: Anesthesiology

## 2012-03-14 ENCOUNTER — Ambulatory Visit (HOSPITAL_COMMUNITY): Payer: Medicaid Other

## 2012-03-14 ENCOUNTER — Encounter (HOSPITAL_COMMUNITY): Payer: Self-pay | Admitting: *Deleted

## 2012-03-14 DIAGNOSIS — C189 Malignant neoplasm of colon, unspecified: Secondary | ICD-10-CM | POA: Insufficient documentation

## 2012-03-14 DIAGNOSIS — I10 Essential (primary) hypertension: Secondary | ICD-10-CM | POA: Insufficient documentation

## 2012-03-14 DIAGNOSIS — C787 Secondary malignant neoplasm of liver and intrahepatic bile duct: Secondary | ICD-10-CM | POA: Insufficient documentation

## 2012-03-14 HISTORY — DX: Personal history of other medical treatment: Z92.89

## 2012-03-14 HISTORY — DX: Melena: K92.1

## 2012-03-14 HISTORY — DX: Anemia, unspecified: D64.9

## 2012-03-14 HISTORY — DX: Cardiac murmur, unspecified: R01.1

## 2012-03-14 HISTORY — PX: PORTACATH PLACEMENT: SHX2246

## 2012-03-14 HISTORY — DX: Dermatitis, unspecified: L30.9

## 2012-03-14 LAB — SURGICAL PCR SCREEN: MRSA, PCR: NEGATIVE

## 2012-03-14 LAB — CBC
HCT: 23.8 % — ABNORMAL LOW (ref 36.0–46.0)
Hemoglobin: 7.5 g/dL — ABNORMAL LOW (ref 12.0–15.0)
MCH: 25.8 pg — ABNORMAL LOW (ref 26.0–34.0)
MCHC: 31.5 g/dL (ref 30.0–36.0)
RDW: 22.2 % — ABNORMAL HIGH (ref 11.5–15.5)

## 2012-03-14 SURGERY — INSERTION, TUNNELED CENTRAL VENOUS DEVICE, WITH PORT
Anesthesia: Monitor Anesthesia Care | Wound class: Clean

## 2012-03-14 MED ORDER — OXYCODONE-ACETAMINOPHEN 5-325 MG PO TABS: 1.0000 | ORAL_TABLET | ORAL | Status: DC | PRN

## 2012-03-14 MED ORDER — MUPIROCIN 2 % EX OINT
TOPICAL_OINTMENT | CUTANEOUS | Status: AC
  Administered 2012-03-14: 1 via NASAL
  Filled 2012-03-14: qty 22

## 2012-03-14 MED ORDER — CEFAZOLIN SODIUM-DEXTROSE 2-3 GM-% IV SOLR: 2.0000 g | INTRAVENOUS | Status: DC

## 2012-03-14 MED ORDER — HYDROMORPHONE HCL PF 1 MG/ML IJ SOLN: 0.2500 mg | INTRAMUSCULAR | Status: DC | PRN

## 2012-03-14 MED ORDER — BUPIVACAINE HCL 0.25 % IJ SOLN
INTRAMUSCULAR | Status: DC | PRN
  Administered 2012-03-14: 10 mL

## 2012-03-14 MED ORDER — SODIUM CHLORIDE 0.9 % IR SOLN
Status: DC | PRN
  Administered 2012-03-14: 08:00:00

## 2012-03-14 MED ORDER — CEFAZOLIN SODIUM-DEXTROSE 2-3 GM-% IV SOLR
INTRAVENOUS | Status: AC
  Administered 2012-03-14: 2 g via INTRAVENOUS
  Filled 2012-03-14: qty 50

## 2012-03-14 MED ORDER — BUPIVACAINE HCL (PF) 0.25 % IJ SOLN
INTRAMUSCULAR | Status: AC
  Filled 2012-03-14: qty 30

## 2012-03-14 MED ORDER — LACTATED RINGERS IV SOLN
INTRAVENOUS | Status: DC | PRN
  Administered 2012-03-14: 07:00:00 via INTRAVENOUS

## 2012-03-14 MED ORDER — HEPARIN SOD (PORK) LOCK FLUSH 100 UNIT/ML IV SOLN
INTRAVENOUS | Status: AC
  Filled 2012-03-14: qty 5

## 2012-03-14 MED ORDER — ONDANSETRON HCL 4 MG/2ML IJ SOLN: 4.0000 mg | Freq: Once | INTRAMUSCULAR | Status: DC | PRN

## 2012-03-14 MED ORDER — FENTANYL CITRATE 0.05 MG/ML IJ SOLN
INTRAMUSCULAR | Status: DC | PRN
  Administered 2012-03-14: 50 ug via INTRAVENOUS
  Administered 2012-03-14: 100 ug via INTRAVENOUS

## 2012-03-14 MED ORDER — IOHEXOL 300 MG/ML  SOLN
INTRAMUSCULAR | Status: DC | PRN
  Administered 2012-03-14: 10 mL via INTRAVENOUS

## 2012-03-14 MED ORDER — 0.9 % SODIUM CHLORIDE (POUR BTL) OPTIME
TOPICAL | Status: DC | PRN
  Administered 2012-03-14: 1000 mL

## 2012-03-14 MED ORDER — BUPIVACAINE-EPINEPHRINE PF 0.25-1:200000 % IJ SOLN
INTRAMUSCULAR | Status: AC
  Filled 2012-03-14: qty 30

## 2012-03-14 SURGICAL SUPPLY — 60 items
ADH SKN CLS APL DERMABOND .7 (GAUZE/BANDAGES/DRESSINGS) ×1
BAG DECANTER FOR FLEXI CONT (MISCELLANEOUS) ×2 IMPLANT
BLADE SURG 10 STRL SS (BLADE) ×2 IMPLANT
BLADE SURG 15 STRL LF DISP TIS (BLADE) ×1 IMPLANT
BLADE SURG 15 STRL SS (BLADE) ×2
BLADE SURG ROTATE 9660 (MISCELLANEOUS) IMPLANT
CANISTER SUCTION 2500CC (MISCELLANEOUS) IMPLANT
CHLORAPREP W/TINT 10.5 ML (MISCELLANEOUS) ×2 IMPLANT
CLOTH BEACON ORANGE TIMEOUT ST (SAFETY) ×2 IMPLANT
COVER MAYO STAND STRL (DRAPES) ×1 IMPLANT
COVER SURGICAL LIGHT HANDLE (MISCELLANEOUS) ×2 IMPLANT
CRADLE DONUT ADULT HEAD (MISCELLANEOUS) ×2 IMPLANT
DECANTER SPIKE VIAL GLASS SM (MISCELLANEOUS) ×2 IMPLANT
DERMABOND ADVANCED (GAUZE/BANDAGES/DRESSINGS) ×1
DERMABOND ADVANCED .7 DNX12 (GAUZE/BANDAGES/DRESSINGS) IMPLANT
DRAPE C-ARM 42X72 X-RAY (DRAPES) ×2 IMPLANT
DRAPE CHEST BREAST 15X10 FENES (DRAPES) ×2 IMPLANT
DRAPE UTILITY 15X26 W/TAPE STR (DRAPE) ×5 IMPLANT
ELECT CAUTERY BLADE 6.4 (BLADE) ×2 IMPLANT
ELECT REM PT RETURN 9FT ADLT (ELECTROSURGICAL) ×2
ELECTRODE REM PT RTRN 9FT ADLT (ELECTROSURGICAL) ×1 IMPLANT
GAUZE SPONGE 4X4 16PLY XRAY LF (GAUZE/BANDAGES/DRESSINGS) ×2 IMPLANT
GLOVE BIO SURGEON STRL SZ7.5 (GLOVE) ×4 IMPLANT
GLOVE BIOGEL PI IND STRL 7.5 (GLOVE) IMPLANT
GLOVE BIOGEL PI IND STRL 8 (GLOVE) ×1 IMPLANT
GLOVE BIOGEL PI INDICATOR 7.5 (GLOVE) ×1
GLOVE BIOGEL PI INDICATOR 8 (GLOVE) ×1
GOWN STRL NON-REIN LRG LVL3 (GOWN DISPOSABLE) ×4 IMPLANT
GOWN STRL REIN XL XLG (GOWN DISPOSABLE) ×2 IMPLANT
INTRODUCER 13FR (MISCELLANEOUS) IMPLANT
INTRODUCER COOK 11FR (CATHETERS) IMPLANT
KIT BASIN OR (CUSTOM PROCEDURE TRAY) ×2 IMPLANT
KIT PORT POWER ISP 8FR (Catheter) IMPLANT
KIT POWER CATH 8FR (Catheter) ×1 IMPLANT
KIT ROOM TURNOVER OR (KITS) ×2 IMPLANT
NDL HYPO 25GX1X1/2 BEV (NEEDLE) ×1 IMPLANT
NEEDLE HYPO 25GX1X1/2 BEV (NEEDLE) ×2 IMPLANT
NS IRRIG 1000ML POUR BTL (IV SOLUTION) ×2 IMPLANT
PACK SURGICAL SETUP 50X90 (CUSTOM PROCEDURE TRAY) ×2 IMPLANT
PAD ARMBOARD 7.5X6 YLW CONV (MISCELLANEOUS) ×2 IMPLANT
PENCIL BUTTON HOLSTER BLD 10FT (ELECTRODE) ×2 IMPLANT
SET INTRODUCER 12FR PACEMAKER (SHEATH) IMPLANT
SET SHEATH INTRODUCER 10FR (MISCELLANEOUS) IMPLANT
SHEATH COOK PEEL AWAY SET 9F (SHEATH) IMPLANT
SUT MNCRL AB 3-0 PS2 18 (SUTURE) ×2 IMPLANT
SUT MON AB 4-0 PC3 18 (SUTURE) ×1 IMPLANT
SUT PROLENE 2 0 CT2 30 (SUTURE) ×2 IMPLANT
SUT SILK 2 0 SH (SUTURE) ×1 IMPLANT
SUT SILK 2 0 TIES 10X30 (SUTURE) ×2 IMPLANT
SUT VIC AB 3-0 SH 27 (SUTURE) ×2
SUT VIC AB 3-0 SH 27X BRD (SUTURE) IMPLANT
SYR 20ML ECCENTRIC (SYRINGE) ×4 IMPLANT
SYR 5ML LUER SLIP (SYRINGE) ×2 IMPLANT
SYR BULB 3OZ (MISCELLANEOUS) ×2 IMPLANT
SYR CONTROL 10ML LL (SYRINGE) ×2 IMPLANT
SYRINGE 10CC LL (SYRINGE) ×2 IMPLANT
TOWEL OR 17X24 6PK STRL BLUE (TOWEL DISPOSABLE) ×2 IMPLANT
TOWEL OR 17X26 10 PK STRL BLUE (TOWEL DISPOSABLE) ×2 IMPLANT
TUBE CONNECTING 12X1/4 (SUCTIONS) IMPLANT
YANKAUER SUCT BULB TIP NO VENT (SUCTIONS) IMPLANT

## 2012-03-14 NOTE — Anesthesia Procedure Notes (Signed)
Procedure Name: MAC Date/Time: 03/14/2012 7:47 AM Performed by: Jerilee Hoh Pre-anesthesia Checklist: Patient identified, Emergency Drugs available, Suction available and Patient being monitored Patient Re-evaluated:Patient Re-evaluated prior to inductionOxygen Delivery Method: Simple face mask Intubation Type: IV induction Placement Confirmation: positive ETCO2 and breath sounds checked- equal and bilateral

## 2012-03-14 NOTE — Telephone Encounter (Signed)
Left message on home phone ID recording Dr Donnelly Stager reply to Cipro. To call [facility] if any synptoms.

## 2012-03-14 NOTE — H&P (View-Only) (Signed)
HISTORY: It is a 56-year-old female who was admitted to the hospital for anemia earlier this month. She was diagnosed with a sigmoid colon cancer. Staging studies revealed a liver mass. The liver mass is posterior right. She is in good health overall and would like to have both of these resected at the same time. I have this scheduled with Dr. Ramirez. She is here to review surgery and to review the overall plan of care. She has not had any further lightheadedness since her discharge. She is back at work. She denies fevers and chills. She has not had any significant rectal bleeding.   PERTINENT REVIEW OF SYSTEMS: Otherwise negative.    Of note, she has had hysterectomy   EXAM: Head: Normocephalic and atraumatic.  Eyes:  Conjunctivae are normal. Pupils are equal, round, and reactive to light. No scleral icterus.  Neck:  Normal range of motion. Neck supple. No tracheal deviation present. No thyromegaly present.  Resp: No respiratory distress, normal effort. Abd:  Abdomen is soft, non distended and non tender. No masses are palpable.  There is no rebound and no guarding. No upper abdominal incisions.   Neurological: Alert and oriented to person, place, and time. Coordination normal.  Skin: Skin is warm and dry. No rash noted. No diaphoretic. No erythema. No pallor.  Psychiatric: Normal mood and affect. Normal behavior. Judgment and thought content normal.      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 Will try to do this laparoscopically.  The procedure was discussed with the patient.  The alternatives were discussed, including doing nothing.  The patient elects to undergo partial hepatectomy.  The procedure was described to the patient including the incision, surgical technique, goals of surgery, post op period and recovery time.  We discussed lifting and driving restrictions as well as time off work.  The complications  were described to the patient.  The complications below were discussed, as well as the fact that unpredicted complications could occur.  This will occur in conjunction with sigmoid colectomy by Dr. Ramirez   Bleeding, possibly requiring a blood transfusion. Infection and possible wound complications such as hernia Possible abortion of the operation due to cirrhosis. Damage to adjacent structures Leak of bile from the surface of the liver Possible need for other procedures, such as abscess drains in radiology or endoscopy.   Possible prolonged hospital stay Possible need for ICU stay including time on the ventilator. Decreased energy level.    Possible early recurrence of cancer Possible complications of your medical problems such as heart disease or arrhythmias. Death (less than 2%)           Edelmira Gallogly L Wilber Fini, MD Surgical Oncology, General & Endocrine Surgery [facility]  Kazibwe, Richard, MD Default, Provider, MD   

## 2012-03-14 NOTE — Anesthesia Postprocedure Evaluation (Signed)
  Anesthesia Post-op Note  Patient: [name]  Procedure(s) Performed: Procedure(s) (LRB) with comments: INSERTION PORT-A-CATH (N/A)  Patient Location: PACU  Anesthesia Type:MAC  Level of Consciousness: awake, alert , oriented and patient cooperative  Airway and Oxygen Therapy: Patient Spontanous Breathing  Post-op Pain: mild  Post-op Assessment: Post-op Vital signs reviewed, Patient's Cardiovascular Status Stable, Respiratory Function Stable, Patent Airway, No signs of Nausea or vomiting and Pain level controlled  Post-op Vital Signs: stable  Complications: No apparent anesthesia complications

## 2012-03-14 NOTE — Progress Notes (Signed)
Dr Katrinka Blazing has signed pt out, and knows she is going to [facility] at this time.  States pt is to hold in [facility] for another hour, then she may go home by cab.  He is aware pt will be traveling home alone, and has stated this is ok, as she has not had anesthesia.

## 2012-03-14 NOTE — Op Note (Addendum)
Pre Operative Diagnosis:  Colon cancer   Post Operative Diagnosis: same  Procedure: Port-A-Cath placement with intraoperative venogram  Surgeon: Dr. Axel Filler  Assistant: none  Anesthesia: GETA  EBL: 5 cc  Complications: the catheter tip seemed to bend at the atrial SVC junction. After multiple attempts to straighten out the catheter, straightening the catheter was unsuccessful. The catheter had a normal venogram. There was a scant amount of venous return on aspiration, the catheter flushed easily.  Counts: reported as correct x 2  Findings:  The catheter was secured at approximately 24 cm. The fluoroscopy showed a gentle curve in the catheter with the tip at the SVC atrial junction.  Indications for procedure:  The patient is a 56 year old female with colon cancer as well as in the liver. Patient is to undergo sigmoid resection. Secondary the patient's need for likely chemotherapy postoperatively catheter was placed electively.  Details of the procedure:The patient was taken back to the operating room. The patient was placed in supine position with bilateral SCDs in place. After appropriate anitbiotics were confirmed, a time-out was confirmed and all facts were verified.  an 18-gauge finder needle was used after the patient was placed in Trendelenburg position. The subclavian vein was cannulized and a wire was placed into the subclavian vein. A dilator and introducer were then placed over the wire in Seldinger type technique. The dilator was then removed. Fluoroscopy was used to visualize the tip of the catheter. The catheter was pending at the atrial junction. After multiple attempts to straighten out the catheter, straighten out the catheter was unsuccessful. The catheter was left at [facility] atrial junction junction with a slight curve to it.  A pocket was made approximately 2 x 2 centimeters in size for the port. The catheter was cut and placed onto the port and secured in place. 2-0  Prolene was used to secure the port to the subcutaneous tissue with 2-0 Prolene x2. There was a gentle curve at the junction of the catheter and the port site. A Huber needle was used to aspirate from the port. There was a scant amount of blood I was able to be aspirated. Secondary to this a venogram was shot. The contrast flowed easily into the SVC-atrial junction however aspiration was minimal.  After multiple attempts and manipulation of the catheter there was only a scant amount of venous return.  I was satisfied with placement of the tip of the catheter. The deep dermal tissue was reapproximated using 3-0 Vicryl in interrupted fashion. The skin was reapproximated using 4-0 Monocryl in a subcuticular fashion. The skin was dressed with Dermabond the patient was awakened from general anesthesia and taken to recovery in stable condition.

## 2012-03-14 NOTE — Interval H&P Note (Signed)
History and Physical Interval Note:  03/14/2012 7:46 AM  [name]  has presented today for surgery, with the diagnosis of colon cancer  The various methods of treatment have been discussed with the patient and family. After consideration of risks, benefits and other options for treatment, the patient has consented to  Procedure(s) (LRB) with comments: INSERTION PORT-A-CATH (N/A) as a surgical intervention .  The patient's history has been reviewed, patient examined, no change in status, stable for surgery.  I have reviewed the patient's chart and labs.  Questions were answered to the patient's satisfaction.     Marigene Ehlers., Jed Limerick

## 2012-03-14 NOTE — Anesthesia Preprocedure Evaluation (Signed)
Anesthesia Evaluation  Patient identified by MRN, date of birth, ID band Patient awake    Reviewed: Allergy & Precautions, H&P , NPO status , Patient's Chart, lab work & pertinent test results  Airway Mallampati: I TM Distance: >3 FB Neck ROM: full    Dental   Pulmonary          Cardiovascular hypertension, Rhythm:regular Rate:Normal     Neuro/Psych    GI/Hepatic   Endo/Other    Renal/GU      Musculoskeletal   Abdominal   Peds  Hematology   Anesthesia Other Findings   Reproductive/Obstetrics                           Anesthesia Physical Anesthesia Plan  ASA: I  Anesthesia Plan: MAC and General   Post-op Pain Management:    Induction: Intravenous  Airway Management Planned: Mask and LMA  Additional Equipment:   Intra-op Plan:   Post-operative Plan:   Informed Consent:   Plan Discussed with: CRNA, Anesthesiologist and Surgeon  Anesthesia Plan Comments:         Anesthesia Quick Evaluation

## 2012-03-14 NOTE — Progress Notes (Signed)
DR Katrinka Blazing NOTIFIED THAT PATIENT DOES NOT HAVE REIDE HOME , PLANS TO TAKE [employer] [facility],  PATIENT'S DAUGHTER 57 Y/O WILL BE HOME WHEN SHE GETS THERE.

## 2012-03-14 NOTE — Transfer of Care (Signed)
Immediate Anesthesia Transfer of Care Note  Patient: [name]  Procedure(s) Performed: Procedure(s) (LRB) with comments: INSERTION PORT-A-CATH (N/A)  Patient Location: PACU  Anesthesia Type:MAC  Level of Consciousness: awake, alert , oriented and patient cooperative  Airway & Oxygen Therapy: Patient Spontanous Breathing  Post-op Assessment: Report given to PACU RN, Post -op Vital signs reviewed and stable and Patient moving all extremities  Post vital signs: Reviewed and stable  Complications: No apparent anesthesia complications

## 2012-03-15 ENCOUNTER — Encounter (HOSPITAL_COMMUNITY): Payer: Self-pay | Admitting: General Surgery

## 2012-03-17 ENCOUNTER — Telehealth: Payer: Self-pay | Admitting: Oncology

## 2012-03-17 NOTE — Telephone Encounter (Signed)
S/W pt in re [facility] F/U 12/20 @ 9:30 w/Dr. Alcide Evener.  Welcome packet mailed.

## 2012-03-29 ENCOUNTER — Encounter (HOSPITAL_COMMUNITY): Payer: Self-pay | Admitting: Pharmacy Technician

## 2012-03-31 ENCOUNTER — Ambulatory Visit (INDEPENDENT_AMBULATORY_CARE_PROVIDER_SITE_OTHER): Payer: Self-pay | Admitting: General Surgery

## 2012-03-31 ENCOUNTER — Encounter (INDEPENDENT_AMBULATORY_CARE_PROVIDER_SITE_OTHER): Payer: Self-pay | Admitting: General Surgery

## 2012-03-31 VITALS — BP 121/84 | HR 70 | Temp 97.6°F | Resp 14 | Ht 70.0 in | Wt 242.6 lb

## 2012-03-31 DIAGNOSIS — Z09 Encounter for follow-up examination after completed treatment for conditions other than malignant neoplasm: Secondary | ICD-10-CM

## 2012-03-31 NOTE — Progress Notes (Signed)
Patient ID: [name], female   DOB: [birth year], 56 y.o.   MRN: [id number] Patient is a 56 year old female with rectal cancer as well as liver metastasis. Patient is status post Port-A-Cath placement. Patient has been doing well from that standpoint. Patient has had no problem with bowel function noticed no blood in her stool. She's had no abdominal pain.  On exam: Her left Port-A-Cath incision is clean dry and intact.  Assessment and plan:  1. Patient is scheduled for colon surgery and liver resection on 04/11/2012. All questions were answered for the patient. 2. Patient will need a bowel prep 2 bottles of magnesium citrate on the day prior to her surgery. 3. All portions were answered and patient was reassured in regard to her surgery.

## 2012-04-01 ENCOUNTER — Encounter (HOSPITAL_COMMUNITY): Admission: RE | Admit: 2012-04-01 | Payer: MEDICAID | Source: Ambulatory Visit

## 2012-04-01 NOTE — Pre-Procedure Instructions (Signed)
20 SHANITA KANAN  04/01/2012   Your procedure is scheduled on:  Monday December 16  Report to [facility] at 5:30 AM.  Call this number if you have problems the morning of surgery: [phone]   Remember:   Do not eat or drink:After Midnight.    Take these medicines the morning of surgery with A SIP OF WATER: Amlodipine (Norvasc)   Do not wear jewelry, make-up or nail polish.  Do not wear lotions, powders, or perfumes. You may wear deodorant.  Do not shave 48 hours prior to surgery. Men may shave face and neck.  Do not bring valuables to the hospital.  Contacts, dentures or bridgework may not be worn into surgery.  Leave suitcase in the car. After surgery it may be brought to your room.  For patients admitted to the hospital, checkout time is 11:00 AM the day of discharge.   Patients discharged the day of surgery will not be allowed to drive home.    Special Instructions: Shower using CHG 2 nights before surgery and the night before surgery.  If you shower the day of surgery use CHG.  Use special wash - you have one bottle of CHG for all showers.  You should use approximately 1/3 of the bottle for each shower.   Please read over the following fact sheets that you were given: Pain Booklet, Coughing and Deep Breathing, Blood Transfusion Information and Surgical Site Infection Prevention

## 2012-04-07 ENCOUNTER — Encounter (HOSPITAL_COMMUNITY)
Admission: RE | Admit: 2012-04-07 | Discharge: 2012-04-07 | Disposition: A | Discharge status: Home / Self Care | Payer: Medicaid Other | Source: Ambulatory Visit | Attending: General Surgery | Admitting: General Surgery

## 2012-04-07 ENCOUNTER — Telehealth (INDEPENDENT_AMBULATORY_CARE_PROVIDER_SITE_OTHER): Payer: Self-pay | Admitting: General Surgery

## 2012-04-07 ENCOUNTER — Encounter (HOSPITAL_COMMUNITY): Payer: Self-pay

## 2012-04-07 HISTORY — DX: Malignant neoplasm of colon, unspecified: C18.9

## 2012-04-07 HISTORY — DX: Malignant neoplasm of colon, unspecified: C78.7

## 2012-04-07 LAB — URINALYSIS, ROUTINE W REFLEX MICROSCOPIC
Hgb urine dipstick: NEGATIVE
Leukocytes, UA: NEGATIVE
Nitrite: NEGATIVE
Protein, ur: NEGATIVE mg/dL
Specific Gravity, Urine: 1.027 (ref 1.005–1.030)
Urobilinogen, UA: 0.2 mg/dL (ref 0.0–1.0)

## 2012-04-07 LAB — COMPREHENSIVE METABOLIC PANEL
Albumin: 3.8 g/dL (ref 3.5–5.2)
Alkaline Phosphatase: 77 U/L (ref 39–117)
BUN: 13 mg/dL (ref 6–23)
Calcium: 10.1 mg/dL (ref 8.4–10.5)
GFR calc Af Amer: 82 mL/min — ABNORMAL LOW (ref >90)
Potassium: 3.7 mEq/L (ref 3.5–5.1)
Sodium: 140 mEq/L (ref 135–145)
Total Protein: 8 g/dL (ref 6.0–8.3)

## 2012-04-07 LAB — CBC WITH DIFFERENTIAL/PLATELET
Basophils Absolute: 0.1 10*3/uL (ref 0.0–0.1)
Basophils Relative: 1 % (ref 0–1)
Eosinophils Absolute: 0.1 10*3/uL (ref 0.0–0.7)
MCH: 25.8 pg — ABNORMAL LOW (ref 26.0–34.0)
MCHC: 31 g/dL (ref 30.0–36.0)
Monocytes Absolute: 0.3 10*3/uL (ref 0.1–1.0)
Neutro Abs: 4.2 10*3/uL (ref 1.7–7.7)
Neutrophils Relative %: 68 % (ref 43–77)
RDW: 18 % — ABNORMAL HIGH (ref 11.5–15.5)

## 2012-04-07 LAB — PROTIME-INR: Prothrombin Time: 13 seconds (ref 11.6–15.2)

## 2012-04-07 LAB — APTT: aPTT: 35 seconds (ref 24–37)

## 2012-04-07 NOTE — Progress Notes (Signed)
Per Dr. Donell Beers referred patient to anesthesiology regarding epidural. Per blood bank type and screen drawn today and will redrawn DOS d/t transfusion in October. Patient instructed to follow bowel prep instructions given by office but not to eat or drink anything after midnight DOS. Patient was emotionally upset during PAT visit advised patient of spiritual care services offered to inpatients if needed.

## 2012-04-07 NOTE — Telephone Encounter (Signed)
Per Dr Derrell Lolling wanted me to call patient to let her know to remember her bowel prep op on Sunday , her surgery is on Monday 04-11-2012

## 2012-04-07 NOTE — Consult Note (Signed)
Anesthesia Consult:  Patient is a 56 year old female scheduled for laparoscopic versus open partial hepatectomy and sigmoid colectomy by Dr. Donell Beers and Dr. Derrell Lolling on 04/11/12.  She was recently diagnosed with colon cancer (adenocarcinoma) with metastasis to the liver.  She underwent Port-a-cath insertion on 03/14/12.  Other history includes non-smoker, obesity, HTN, benign murmur, anemia, hysterectomy, breast lumpectomy for benign lesion.  She has worked in [employer] at [employer] for over 20 years.  Her PCP is Dr. Dow Adolph.  I spoke with Ms. Leavy Cella at her PAT appointment today as Dr. Donell Beers requested an anesthesia consult regarding possible epidural for post-operative pain management.  Patient has never had a spinal or epidural.  She denies prior back surgeries, bleeding tendencies, and anti-platelet medications.  We talked about some of the risks and benefits.  She will consider her options and talk further with Dr. Donell Beers and her Anesthesiologist on the day of surgery.  EKG on 02/16/12 showed NSR.  She had a stress and echo > 7 years ago and was evaluated by Cardiologist Dr. Reyes Ivan at that time.  She reports no additional cardiac testing was recommended.  She denies any recent chest pain, shortness of breath, palpitations, syncope/near syncope.  She has felt tired since dealing with anemia and her new cancer diagnosis.  CXR on 01/04/12 showed no active disease.  Labs noted.  H/H remain decreased at 7.5/24.2 (previously 7.5/23.8 on 03/14/12).  PLT 434.  PT/PTT WNL.  She received a transfusion in 01/2012, so her T&C specimen has to be drawn on the day of surgery.  Epic indicates that labs have been reviewed by Dr. Donell Beers.  Patient does report occasional dark red streaks in her stools, but no bright red or clots of blood noted.  CEA elevated at 456.  Shonna Chock, PA-C 04/07/12 1745

## 2012-04-10 MED ORDER — DEXTROSE 5 % IV SOLN
2.0000 g | INTRAVENOUS | Status: AC
  Administered 2012-04-11: 2 g via INTRAVENOUS
  Filled 2012-04-10: qty 2

## 2012-04-10 MED ORDER — METRONIDAZOLE IN NACL 5-0.79 MG/ML-% IV SOLN
500.0000 mg | INTRAVENOUS | Status: AC
  Administered 2012-04-11: 500 mg via INTRAVENOUS
  Filled 2012-04-10: qty 100

## 2012-04-11 ENCOUNTER — Encounter (HOSPITAL_COMMUNITY): Payer: Self-pay | Admitting: Vascular Surgery

## 2012-04-11 ENCOUNTER — Encounter (HOSPITAL_COMMUNITY): Payer: Self-pay | Admitting: *Deleted

## 2012-04-11 ENCOUNTER — Other Ambulatory Visit: Payer: Self-pay | Admitting: *Deleted

## 2012-04-11 ENCOUNTER — Ambulatory Visit (HOSPITAL_COMMUNITY): Payer: Medicaid Other | Admitting: Vascular Surgery

## 2012-04-11 ENCOUNTER — Inpatient Hospital Stay (HOSPITAL_COMMUNITY)
Admission: RE | Admit: 2012-04-11 | Discharge: 2012-04-17 | DRG: 330 | Disposition: A | Discharge status: Home / Self Care | Payer: Medicaid Other | Source: Ambulatory Visit | Attending: General Surgery | Admitting: General Surgery

## 2012-04-11 ENCOUNTER — Encounter (HOSPITAL_COMMUNITY)
Admission: RE | Disposition: A | Discharge status: Home / Self Care | Payer: Self-pay | Source: Ambulatory Visit | Attending: General Surgery

## 2012-04-11 DIAGNOSIS — C189 Malignant neoplasm of colon, unspecified: Secondary | ICD-10-CM

## 2012-04-11 DIAGNOSIS — C787 Secondary malignant neoplasm of liver and intrahepatic bile duct: Secondary | ICD-10-CM | POA: Diagnosis present

## 2012-04-11 DIAGNOSIS — E611 Iron deficiency: Secondary | ICD-10-CM

## 2012-04-11 DIAGNOSIS — H0520 Unspecified exophthalmos: Secondary | ICD-10-CM

## 2012-04-11 DIAGNOSIS — R7881 Bacteremia: Secondary | ICD-10-CM

## 2012-04-11 DIAGNOSIS — R011 Cardiac murmur, unspecified: Secondary | ICD-10-CM

## 2012-04-11 DIAGNOSIS — D62 Acute posthemorrhagic anemia: Secondary | ICD-10-CM

## 2012-04-11 DIAGNOSIS — E669 Obesity, unspecified: Secondary | ICD-10-CM

## 2012-04-11 DIAGNOSIS — M1990 Unspecified osteoarthritis, unspecified site: Secondary | ICD-10-CM

## 2012-04-11 DIAGNOSIS — D259 Leiomyoma of uterus, unspecified: Secondary | ICD-10-CM

## 2012-04-11 DIAGNOSIS — C187 Malignant neoplasm of sigmoid colon: Principal | ICD-10-CM | POA: Diagnosis present

## 2012-04-11 DIAGNOSIS — I10 Essential (primary) hypertension: Secondary | ICD-10-CM | POA: Diagnosis present

## 2012-04-11 DIAGNOSIS — E876 Hypokalemia: Secondary | ICD-10-CM

## 2012-04-11 HISTORY — PX: LAPAROSCOPY: SHX197

## 2012-04-11 HISTORY — PX: CHOLECYSTECTOMY: SHX55

## 2012-04-11 HISTORY — PX: LAPAROSCOPIC SIGMOID COLECTOMY: SHX5928

## 2012-04-11 HISTORY — PX: ILEOCECETOMY: SHX5857

## 2012-04-11 HISTORY — PX: OPEN PARTIAL HEPATECTOMY [83]: SHX5987

## 2012-04-11 LAB — CBC
MCH: 27.2 pg (ref 26.0–34.0)
MCHC: 32.6 g/dL (ref 30.0–36.0)
MCV: 83.5 fL (ref 78.0–100.0)
Platelets: 289 10*3/uL (ref 150–400)
RDW: 17.1 % — ABNORMAL HIGH (ref 11.5–15.5)

## 2012-04-11 LAB — POCT I-STAT 7, (LYTES, BLD GAS, ICA,H+H)
Acid-Base Excess: 2 mmol/L (ref 0.0–2.0)
Bicarbonate: 26.3 mEq/L — ABNORMAL HIGH (ref 20.0–24.0)
O2 Saturation: 100 %
Sodium: 139 mEq/L (ref 135–145)
TCO2: 27 mmol/L (ref 0–100)
pO2, Arterial: 482 mmHg — ABNORMAL HIGH (ref 80.0–100.0)

## 2012-04-11 LAB — PREPARE RBC (CROSSMATCH)

## 2012-04-11 SURGERY — COLECTOMY, SIGMOID, LAPAROSCOPIC
Anesthesia: General | Site: Abdomen | Laterality: Right | Wound class: Clean Contaminated

## 2012-04-11 MED ORDER — OXYCODONE HCL 5 MG PO TABS: 5.0000 mg | ORAL_TABLET | Freq: Once | ORAL | Status: DC | PRN

## 2012-04-11 MED ORDER — DIPHENHYDRAMINE HCL 50 MG/ML IJ SOLN: 12.5000 mg | Freq: Four times a day (QID) | INTRAMUSCULAR | Status: DC | PRN

## 2012-04-11 MED ORDER — ROCURONIUM BROMIDE 100 MG/10ML IV SOLN
INTRAVENOUS | Status: DC | PRN
  Administered 2012-04-11: 50 mg via INTRAVENOUS

## 2012-04-11 MED ORDER — MIDAZOLAM HCL 5 MG/5ML IJ SOLN
INTRAMUSCULAR | Status: DC | PRN
  Administered 2012-04-11 (×2): 1 mg via INTRAVENOUS

## 2012-04-11 MED ORDER — DEXTROSE 5 % IV SOLN
1.0000 g | Freq: Four times a day (QID) | INTRAVENOUS | Status: AC
  Administered 2012-04-11 – 2012-04-12 (×3): 1 g via INTRAVENOUS
  Filled 2012-04-11 (×3): qty 1

## 2012-04-11 MED ORDER — SODIUM CHLORIDE 0.9 % IR SOLN
Status: DC | PRN
  Administered 2012-04-11: 1000 mL

## 2012-04-11 MED ORDER — ENOXAPARIN SODIUM 40 MG/0.4ML ~~LOC~~ SOLN
40.0000 mg | SUBCUTANEOUS | Status: DC
  Administered 2012-04-12 – 2012-04-17 (×6): 40 mg via SUBCUTANEOUS
  Filled 2012-04-11 (×7): qty 0.4

## 2012-04-11 MED ORDER — FENTANYL CITRATE 0.05 MG/ML IJ SOLN
INTRAMUSCULAR | Status: DC | PRN
  Administered 2012-04-11 (×5): 50 ug via INTRAVENOUS
  Administered 2012-04-11: 100 ug via INTRAVENOUS
  Administered 2012-04-11 (×3): 50 ug via INTRAVENOUS
  Administered 2012-04-11: 25 ug via INTRAVENOUS
  Administered 2012-04-11: 100 ug via INTRAVENOUS
  Administered 2012-04-11: 150 ug via INTRAVENOUS

## 2012-04-11 MED ORDER — MORPHINE SULFATE (PF) 1 MG/ML IV SOLN
INTRAVENOUS | Status: AC
  Filled 2012-04-11: qty 25

## 2012-04-11 MED ORDER — LIDOCAINE HCL (PF) 1 % IJ SOLN
INTRAMUSCULAR | Status: AC
  Filled 2012-04-11: qty 30

## 2012-04-11 MED ORDER — STERILE WATER FOR IRRIGATION IR SOLN
Status: DC | PRN
  Administered 2012-04-11: 1000 mL

## 2012-04-11 MED ORDER — ONDANSETRON HCL 4 MG/2ML IJ SOLN: 4.0000 mg | Freq: Four times a day (QID) | INTRAMUSCULAR | Status: DC | PRN

## 2012-04-11 MED ORDER — GLYCOPYRROLATE 0.2 MG/ML IJ SOLN
INTRAMUSCULAR | Status: DC | PRN
  Administered 2012-04-11: .8 mg via INTRAVENOUS

## 2012-04-11 MED ORDER — BUPIVACAINE 0.25 % ON-Q PUMP SINGLE CATH 300ML
300.0000 mL | INJECTION | Status: DC
Start: 2012-04-11 — End: 2012-04-11
  Filled 2012-04-11: qty 300

## 2012-04-11 MED ORDER — LIDOCAINE HCL (CARDIAC) 20 MG/ML IV SOLN
INTRAVENOUS | Status: DC | PRN
  Administered 2012-04-11: 80 mg via INTRAVENOUS

## 2012-04-11 MED ORDER — KCL IN DEXTROSE-NACL 20-5-0.45 MEQ/L-%-% IV SOLN
INTRAVENOUS | Status: DC
  Administered 2012-04-12 – 2012-04-13 (×2): via INTRAVENOUS
  Administered 2012-04-13: 100 mL/h via INTRAVENOUS
  Administered 2012-04-14 (×2): via INTRAVENOUS
  Filled 2012-04-11 (×14): qty 1000

## 2012-04-11 MED ORDER — OXYCODONE HCL 5 MG/5ML PO SOLN: 5.0000 mg | Freq: Once | ORAL | Status: DC | PRN

## 2012-04-11 MED ORDER — LACTATED RINGERS IV SOLN
INTRAVENOUS | Status: DC | PRN
  Administered 2012-04-11: 08:00:00 via INTRAVENOUS

## 2012-04-11 MED ORDER — AMLODIPINE BESYLATE 10 MG PO TABS
10.0000 mg | ORAL_TABLET | Freq: Every day | ORAL | Status: DC
  Administered 2012-04-11 – 2012-04-17 (×7): 10 mg via ORAL
  Filled 2012-04-11 (×7): qty 1

## 2012-04-11 MED ORDER — ALVIMOPAN 12 MG PO CAPS
12.0000 mg | ORAL_CAPSULE | Freq: Two times a day (BID) | ORAL | Status: DC
  Administered 2012-04-12 – 2012-04-16 (×9): 12 mg via ORAL
  Filled 2012-04-11 (×11): qty 1

## 2012-04-11 MED ORDER — BUPIVACAINE-EPINEPHRINE PF 0.25-1:200000 % IJ SOLN
INTRAMUSCULAR | Status: AC
  Filled 2012-04-11: qty 30

## 2012-04-11 MED ORDER — BUPIVACAINE 0.25 % ON-Q PUMP DUAL CATH 300 ML
300.0000 mL | INJECTION | Status: DC
  Administered 2012-04-11: 300 mL
  Filled 2012-04-11: qty 300

## 2012-04-11 MED ORDER — EVICEL 5 ML EX KIT
PACK | CUTANEOUS | Status: DC | PRN
  Administered 2012-04-11: 5 mL

## 2012-04-11 MED ORDER — ONDANSETRON HCL 4 MG PO TABS
4.0000 mg | ORAL_TABLET | Freq: Four times a day (QID) | ORAL | Status: DC | PRN
  Filled 2012-04-11: qty 1

## 2012-04-11 MED ORDER — NEOSTIGMINE METHYLSULFATE 1 MG/ML IJ SOLN
INTRAMUSCULAR | Status: DC | PRN
  Administered 2012-04-11: 4 mg via INTRAVENOUS

## 2012-04-11 MED ORDER — OXYCODONE-ACETAMINOPHEN 5-325 MG PO TABS
1.0000 | ORAL_TABLET | ORAL | Status: DC | PRN
  Administered 2012-04-12: 1 via ORAL
  Administered 2012-04-12 – 2012-04-17 (×7): 2 via ORAL
  Filled 2012-04-11 (×2): qty 2
  Filled 2012-04-11: qty 1
  Filled 2012-04-11 (×5): qty 2

## 2012-04-11 MED ORDER — HYDROMORPHONE HCL PF 1 MG/ML IJ SOLN
0.2500 mg | INTRAMUSCULAR | Status: DC | PRN
  Administered 2012-04-11 (×2): 0.5 mg via INTRAVENOUS

## 2012-04-11 MED ORDER — ACETAMINOPHEN 10 MG/ML IV SOLN
1000.0000 mg | Freq: Four times a day (QID) | INTRAVENOUS | Status: AC
  Administered 2012-04-11 – 2012-04-12 (×4): 1000 mg via INTRAVENOUS
  Filled 2012-04-11 (×4): qty 100

## 2012-04-11 MED ORDER — SODIUM CHLORIDE 0.9 % IJ SOLN: 9.0000 mL | INTRAMUSCULAR | Status: DC | PRN

## 2012-04-11 MED ORDER — EVICEL 5 ML EX KIT
PACK | CUTANEOUS | Status: AC
  Filled 2012-04-11: qty 1

## 2012-04-11 MED ORDER — HYDROMORPHONE HCL PF 1 MG/ML IJ SOLN
INTRAMUSCULAR | Status: AC
  Filled 2012-04-11: qty 1

## 2012-04-11 MED ORDER — NALOXONE HCL 0.4 MG/ML IJ SOLN
0.4000 mg | INTRAMUSCULAR | Status: DC | PRN
  Filled 2012-04-11: qty 1

## 2012-04-11 MED ORDER — VECURONIUM BROMIDE 10 MG IV SOLR
INTRAVENOUS | Status: DC | PRN
  Administered 2012-04-11: 2 mg via INTRAVENOUS
  Administered 2012-04-11: 4 mg via INTRAVENOUS
  Administered 2012-04-11: 2 mg via INTRAVENOUS
  Administered 2012-04-11: .5 mg via INTRAVENOUS
  Administered 2012-04-11: 2 mg via INTRAVENOUS

## 2012-04-11 MED ORDER — ONDANSETRON HCL 4 MG/2ML IJ SOLN
4.0000 mg | Freq: Four times a day (QID) | INTRAMUSCULAR | Status: DC | PRN
  Administered 2012-04-14: 4 mg via INTRAVENOUS
  Filled 2012-04-11 (×2): qty 2

## 2012-04-11 MED ORDER — LIDOCAINE HCL 1 % IJ SOLN
INTRAMUSCULAR | Status: DC | PRN
  Administered 2012-04-11: 10:00:00 via INTRAMUSCULAR

## 2012-04-11 MED ORDER — DIPHENHYDRAMINE HCL 12.5 MG/5ML PO ELIX
12.5000 mg | ORAL_SOLUTION | Freq: Four times a day (QID) | ORAL | Status: DC | PRN
  Filled 2012-04-11: qty 5

## 2012-04-11 MED ORDER — PROPOFOL 10 MG/ML IV BOLUS
INTRAVENOUS | Status: DC | PRN
  Administered 2012-04-11: 180 mg via INTRAVENOUS

## 2012-04-11 MED ORDER — 0.9 % SODIUM CHLORIDE (POUR BTL) OPTIME
TOPICAL | Status: DC | PRN
  Administered 2012-04-11 (×6): 1000 mL

## 2012-04-11 MED ORDER — BUPIVACAINE ON-Q PAIN PUMP (FOR ORDER SET NO CHG)
INJECTION | Status: AC
  Filled 2012-04-11: qty 1

## 2012-04-11 MED ORDER — MORPHINE SULFATE (PF) 1 MG/ML IV SOLN
INTRAVENOUS | Status: DC
  Administered 2012-04-11: 13:00:00 via INTRAVENOUS
  Administered 2012-04-11: 4.5 mg via INTRAVENOUS
  Administered 2012-04-11: 3 mg via INTRAVENOUS
  Administered 2012-04-12: 4.5 mg via INTRAVENOUS
  Administered 2012-04-12: 3 mg via INTRAVENOUS
  Administered 2012-04-12: 1.5 mg via INTRAVENOUS
  Administered 2012-04-12: 4.5 mg via INTRAVENOUS
  Administered 2012-04-12: 13:00:00 via INTRAVENOUS
  Administered 2012-04-13: 3 mg via INTRAVENOUS
  Administered 2012-04-13: 22:00:00 via INTRAVENOUS
  Administered 2012-04-13: 1 mg via INTRAVENOUS
  Administered 2012-04-13: 4.5 mg via INTRAVENOUS
  Administered 2012-04-13: 1.5 mg via INTRAVENOUS
  Administered 2012-04-13: 4.7 mg via INTRAVENOUS
  Administered 2012-04-13: 2 mg via INTRAVENOUS
  Administered 2012-04-14: 8.02 mg via INTRAVENOUS
  Administered 2012-04-14: 7.5 mg via INTRAVENOUS
  Administered 2012-04-14: 1.5 mg via INTRAVENOUS
  Administered 2012-04-14: 6.8 mg via INTRAVENOUS
  Administered 2012-04-14: 4.5 mg via INTRAVENOUS
  Administered 2012-04-14: 1.5 mg via INTRAVENOUS
  Administered 2012-04-15: 7.5 mg via INTRAVENOUS
  Administered 2012-04-15: 6 mg via INTRAVENOUS
  Administered 2012-04-15: 7.5 mg via INTRAVENOUS
  Filled 2012-04-11 (×3): qty 25

## 2012-04-11 MED ORDER — SODIUM CHLORIDE 0.9 % IV SOLN
INTRAVENOUS | Status: DC | PRN
  Administered 2012-04-11: 09:00:00 via INTRAVENOUS

## 2012-04-11 MED ORDER — LACTATED RINGERS IV SOLN
INTRAVENOUS | Status: DC | PRN
  Administered 2012-04-11 (×2): via INTRAVENOUS

## 2012-04-11 MED ORDER — MORPHINE SULFATE 2 MG/ML IJ SOLN: 1.0000 mg | INTRAMUSCULAR | Status: DC | PRN

## 2012-04-11 SURGICAL SUPPLY — 102 items
BLADE SURG 10 STRL SS (BLADE) ×5 IMPLANT
CANISTER SUCTION 2500CC (MISCELLANEOUS) ×5 IMPLANT
CATH KIT ON Q 7.5IN SLV (PAIN MANAGEMENT) ×4 IMPLANT
CATH ROBINSON RED A/P 14FR (CATHETERS) ×2 IMPLANT
CATH ROBINSON RED A/P 16FR (CATHETERS) ×2 IMPLANT
CHLORAPREP W/TINT 26ML (MISCELLANEOUS) ×5 IMPLANT
CLAMP ENDO BABCK 10MM (STAPLE) ×2 IMPLANT
CLOTH BEACON ORANGE TIMEOUT ST (SAFETY) ×5 IMPLANT
COVER SURGICAL LIGHT HANDLE (MISCELLANEOUS) ×5 IMPLANT
DRAPE PROXIMA HALF (DRAPES) ×4 IMPLANT
DRAPE UTILITY 15X26 W/TAPE STR (DRAPE) ×12 IMPLANT
DRAPE WARM FLUID 44X44 (DRAPE) ×10 IMPLANT
ELECT BLADE 6.5 EXT (BLADE) ×5 IMPLANT
ELECT CAUTERY BLADE 6.4 (BLADE) ×7 IMPLANT
ELECT PAD DSPR THERM+ ADLT (MISCELLANEOUS) ×5 IMPLANT
ELECT REM PT RETURN 9FT ADLT (ELECTROSURGICAL) ×5
ELECTRODE REM PT RTRN 9FT ADLT (ELECTROSURGICAL) ×4 IMPLANT
GLOVE BIO SURGEON STRL SZ 6 (GLOVE) ×10 IMPLANT
GLOVE BIO SURGEON STRL SZ7.5 (GLOVE) ×4 IMPLANT
GLOVE BIOGEL PI IND STRL 6.5 (GLOVE) ×7 IMPLANT
GLOVE BIOGEL PI IND STRL 7.0 (GLOVE) ×3 IMPLANT
GLOVE BIOGEL PI IND STRL 7.5 (GLOVE) ×1 IMPLANT
GLOVE BIOGEL PI IND STRL 8 (GLOVE) ×1 IMPLANT
GLOVE BIOGEL PI INDICATOR 6.5 (GLOVE) ×4
GLOVE BIOGEL PI INDICATOR 7.0 (GLOVE) ×3
GLOVE BIOGEL PI INDICATOR 7.5 (GLOVE) ×1
GLOVE BIOGEL PI INDICATOR 8 (GLOVE) ×1
GLOVE SS BIOGEL STRL SZ 6.5 (GLOVE) ×1 IMPLANT
GLOVE SS BIOGEL STRL SZ 7.5 (GLOVE) ×1 IMPLANT
GLOVE SUPERSENSE BIOGEL SZ 6.5 (GLOVE) ×1
GLOVE SUPERSENSE BIOGEL SZ 7.5 (GLOVE) ×1
GLOVE SURG SS PI 7.0 STRL IVOR (GLOVE) ×6 IMPLANT
GOWN PREVENTION PLUS XLARGE (GOWN DISPOSABLE) ×2 IMPLANT
GOWN PREVENTION PLUS XXLARGE (GOWN DISPOSABLE) ×8 IMPLANT
GOWN STRL NON-REIN LRG LVL3 (GOWN DISPOSABLE) ×15 IMPLANT
GOWN STRL REIN XL XLG (GOWN DISPOSABLE) ×2 IMPLANT
HAND PENCIL TRP OPTION (MISCELLANEOUS) ×2 IMPLANT
KIT BASIN OR (CUSTOM PROCEDURE TRAY) ×5 IMPLANT
KIT ROOM TURNOVER OR (KITS) ×5 IMPLANT
LEGGING LITHOTOMY PAIR STRL (DRAPES) ×2 IMPLANT
LIGASURE 5MM LAPAROSCOPIC (INSTRUMENTS) ×2 IMPLANT
NDL INSUFFLATION 14GA 120MM (NEEDLE) IMPLANT
NEEDLE INSUFFLATION 14GA 120MM (NEEDLE) ×5 IMPLANT
NS IRRIG 1000ML POUR BTL (IV SOLUTION) ×14 IMPLANT
PACK GENERAL/GYN (CUSTOM PROCEDURE TRAY) ×5 IMPLANT
PAD ARMBOARD 7.5X6 YLW CONV (MISCELLANEOUS) ×12 IMPLANT
RELOAD PROXIMATE 75MM BLUE (ENDOMECHANICALS) ×20 IMPLANT
RELOAD PROXIMATE TA60MM BLUE (ENDOMECHANICALS) ×5 IMPLANT
RELOAD STAPLE 60 BLU REG PROX (ENDOMECHANICALS) IMPLANT
RELOAD STAPLE 75 3.8 BLU REG (ENDOMECHANICALS) IMPLANT
RELOAD STAPLER LINEAR PROX 30 (STAPLE) IMPLANT
RELOAD WHITE ECR60W (STAPLE) ×10 IMPLANT
SCALPEL HARMONIC ACE (MISCELLANEOUS) ×5 IMPLANT
SCISSORS HARMONIC WAVE 18CM (INSTRUMENTS) ×5 IMPLANT
SCISSORS LAP 5X35 DISP (ENDOMECHANICALS) ×2 IMPLANT
SET IRRIG TUBING LAPAROSCOPIC (IRRIGATION / IRRIGATOR) ×2 IMPLANT
SLEEVE ENDOPATH XCEL 5M (ENDOMECHANICALS) ×10 IMPLANT
SPECIMEN JAR LARGE (MISCELLANEOUS) ×4 IMPLANT
SPECIMEN JAR MEDIUM (MISCELLANEOUS) ×2 IMPLANT
SPONGE GAUZE 4X4 12PLY (GAUZE/BANDAGES/DRESSINGS) ×2 IMPLANT
SPONGE LAP 18X18 X RAY DECT (DISPOSABLE) ×8 IMPLANT
STAPLE ECHEON FLEX 60 POW ENDO (STAPLE) ×2 IMPLANT
STAPLER CIRC ILS CVD 33MM 37CM (STAPLE) ×2 IMPLANT
STAPLER GUN LINEAR PROX 60 (STAPLE) ×2 IMPLANT
STAPLER PROXIMATE 75MM BLUE (STAPLE) ×2 IMPLANT
STAPLER RELOAD LINEAR PROX 30 (STAPLE)
STAPLER RELOADABLE 30 BLU REG (STAPLE) IMPLANT
STAPLER VISISTAT 35W (STAPLE) ×5 IMPLANT
SUCTION POOLE TIP (SUCTIONS) ×5 IMPLANT
SUT CHROMIC 3 0 SH 27 (SUTURE) IMPLANT
SUT CHROMIC 4 0 RB 1X27 (SUTURE) IMPLANT
SUT ETHILON 1 TP 1 60 (SUTURE) ×3 IMPLANT
SUT ETHILON 2 0 FS 18 (SUTURE) ×5 IMPLANT
SUT MNCRL AB 4-0 PS2 18 (SUTURE) ×3 IMPLANT
SUT PDS AB 1 CT  36 (SUTURE)
SUT PDS AB 1 CT 36 (SUTURE) IMPLANT
SUT PDS AB 1 TP1 96 (SUTURE) ×4 IMPLANT
SUT PDS II 0 TP-1 LOOPED 60 (SUTURE) ×6 IMPLANT
SUT PROLENE 2 0 CT2 30 (SUTURE) ×2 IMPLANT
SUT PROLENE 3 0 SH 48 (SUTURE) ×2 IMPLANT
SUT VIC AB 0 CT1 18XCR BRD 8 (SUTURE) ×1 IMPLANT
SUT VIC AB 0 CT1 8-18 (SUTURE) ×5
SUT VIC AB 2-0 CTX 27 (SUTURE) ×6 IMPLANT
SUT VIC AB 2-0 SH 18 (SUTURE) ×5 IMPLANT
SUT VIC AB 2-0 SH 27 (SUTURE) ×5
SUT VIC AB 2-0 SH 27XBRD (SUTURE) ×1 IMPLANT
SUT VIC AB 3-0 SH 18 (SUTURE) ×5 IMPLANT
SUT VICRYL AB 2 0 TIES (SUTURE) ×5 IMPLANT
SUT VICRYL AB 3 0 TIES (SUTURE) ×5 IMPLANT
TAPE CLOTH SURG 4X10 WHT LF (GAUZE/BANDAGES/DRESSINGS) ×2 IMPLANT
TAPE UMBILICAL COTTON 1/8X30 (MISCELLANEOUS) ×2 IMPLANT
TOWEL OR 17X24 6PK STRL BLUE (TOWEL DISPOSABLE) ×5 IMPLANT
TOWEL OR 17X26 10 PK STRL BLUE (TOWEL DISPOSABLE) ×9 IMPLANT
TRAY FOLEY CATH 14FRSI W/METER (CATHETERS) ×5 IMPLANT
TRAY LAPAROSCOPIC (CUSTOM PROCEDURE TRAY) ×5 IMPLANT
TRAY PROCTOSCOPIC FIBER OPTIC (SET/KITS/TRAYS/PACK) IMPLANT
TROCAR XCEL 12X100 BLDLESS (ENDOMECHANICALS) ×2 IMPLANT
TROCAR XCEL NON-BLD 5MMX100MML (ENDOMECHANICALS) ×5 IMPLANT
TUBING FILTER THERMOFLATOR (ELECTROSURGICAL) ×5 IMPLANT
TUNNELER SHEATH ON-Q 16GX12 DP (PAIN MANAGEMENT) ×2 IMPLANT
WATER STERILE IRR 1000ML POUR (IV SOLUTION) ×2 IMPLANT
YANKAUER SUCT BULB TIP NO VENT (SUCTIONS) ×10 IMPLANT

## 2012-04-11 NOTE — H&P (View-Only) (Signed)
Patient ID: [name], female   DOB: 07/24/1955, 56 y.o.   MRN: [id number] Patient is a 56-year-old female with rectal cancer as well as liver metastasis. Patient is status post Port-A-Cath placement. Patient has been doing well from that standpoint. Patient has had no problem with bowel function noticed no blood in her stool. She's had no abdominal pain.  On exam: Her left Port-A-Cath incision is clean dry and intact.  Assessment and plan:  1. Patient is scheduled for colon surgery and liver resection on 04/11/2012. All questions were answered for the patient. 2. Patient will need a bowel prep 2 bottles of magnesium citrate on the day prior to her surgery. 3. All portions were answered and patient was reassured in regard to her surgery. 

## 2012-04-11 NOTE — Op Note (Signed)
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 Axel Filler  Assistant: Dr. Donell Beers  Anesthesia: GETA  EBL: for portion approximately 200 cc  Complications: none  Counts: reported as correct x 2  Findings:  The patient had a large sigmoid colon mass which also had terminal ileum the small bowel adhered. The tattoo was incorporated into the colon mass. This was a large right liver mass. The anastomosis of the colon was approximately 18-20 cm.  There was a negative bubble test. Anastomosis lay tension-free.  Indications for procedure:  The patient is a 56 year old female who was initially seen in the hospital for a near obstructing sigmoid colon mass.  The patient also was found to have a large right-sided hepatic mass.  Details of the procedure:The patient was taken back to the operating room. The patient was placed in lithotomy position with bilateral SCDs in place. After appropriate anitbiotics were confirmed, a time-out was confirmed and all facts were verified.  The peritoneum was used to create a pneumoperitoneum in the left lower quadrant. A 5mm trocar was then advanced into the abdomen there was no injury to any intra-abdominal organs.  A  supraumbilical 5mm port was then placed into the abdomen under direct visualization as was 5 mm right lower quadrant and right upper quadrant port.  The patient placed in Trendelenburg in the area of the sigmoid mass and tattoo wasn't visualized. There were 2 loops of small bowel adhered to the sigmoid colon mass. Secondary to dense adhesions converted to open operation. Alert a long midline incision was made with a 10 blade and Bovie cautery was used to maintain hemostasis. Dissection was taken down to the  midline the abdomen was entered. We then were able to palpate the sigmoid mass.  It was obvious at this time there was 2 loops of terminal ileum that were adhered to the tumor of the sigmoid colon.   At this time we proceeded to Incise the white line of Toldt on the left colon. We were ableto identify the left ureter and protect it throughout the case. The left colon was reflected medially. We then proceeded to mobilize the right side of the sigmoid colon, and the right ureter was identified. The terminal ileum small bowel that was adhered to the tumor was excised with the specimen via 2 60 mm staple fires and the mesentery was then taken with the LigaSure device.  The proximal portion of the sigmoid colon was taken approximately 10 cm proximal to the mass in the standard fashion via a 60 mm GIA stapler. The mesentery was taken with the LigaSure device. We then proceeded to excise the mesocolon inferiorly to approximately 6 cm distal to the mass.  The distal portion of the sigmoid colon which was approximately 6 cm from the mass was transected using a 60 TA stapler.    We proceeded to remove the portion of the liver. This portion will be dictated by Dr. Donell Beers under separate cover.  We then proceeded to mobilize the right colon to remove the terminal ileum and cecum. Across the cecum with a 60 GIA stapler in the standard fashion. The mesentery was then ligated with LigaSure device. We then created our stapled anastomosis via a 45 mm GIA stapler which created a common channel.  The resulting defect was then closed with  a 60 mm TA stapler. The mesenteric defect was reapproximated in a total Vicryl in running fashion.  In order for the sigmoid colon to reach into the pelvis the splenic flexure was taken down. This allowed the distal portion of the colon to reach into the pelvis tension-free. A 33  EEA circular stapler was then used to anastomose the sigmoid to the rectum. This was done in a standard fashion.   There was a negative bubble test.  The anastomosis was tension-free. The anastomosis was approximately 18-20 cm from the anal verge. At this time the abdomen was irrigated with sterile saline.  After examining the hepatic bed in place a vertical quadrant drain we reapproximated the midline with a looped #1 PDS. the standard fashion.  All port sites and the midline skin was reapproximated using staples.   The patient was taken to the recover in stable condition.

## 2012-04-11 NOTE — Interval H&P Note (Signed)
History and Physical Interval Note:  04/11/2012 7:39 AM  [name]  has presented today for surgery, with the diagnosis of COLON CANCER  The various methods of treatment have been discussed with the patient and family. After consideration of risks, benefits and other options for treatment, the patient has consented to  Procedure(s) (LRB) with comments: LAPAROSCOPIC HEPATECTOMY (N/A) - Laparoscopic partial hepatectomy OPEN HEPATECTOMY [83] (N/A) - possible open hepatectomy LAPAROSCOPIC SIGMOID COLECTOMY (N/A) as a surgical intervention .  The patient's history has been reviewed, patient examined, no change in status, stable for surgery.  I have reviewed the patient's chart and labs.  Questions were answered to the patient's satisfaction.     Melda Mermelstein

## 2012-04-11 NOTE — Preoperative (Signed)
Beta Blockers   Reason not to administer Beta Blockers:Not Applicable 

## 2012-04-11 NOTE — Anesthesia Postprocedure Evaluation (Signed)
  Anesthesia Post-op Note  Patient: [name]  Procedure(s) Performed: Procedure(s) (LRB) with comments: LAPAROSCOPIC SIGMOID COLECTOMY () - converted to Open with splenic flexure take down OPEN PARTIAL HEPATECTOMY [83] (Right) LAPAROSCOPY DIAGNOSTIC (N/A) CHOLECYSTECTOMY () ILEOCECETOMY (N/A)  Patient Location: PACU  Anesthesia Type:General  Level of Consciousness: awake  Airway and Oxygen Therapy: Patient Spontanous Breathing and Patient connected to nasal cannula oxygen  Post-op Pain: mild  Post-op Assessment: Post-op Vital signs reviewed, Patient's Cardiovascular Status Stable, Respiratory Function Stable, Patent Airway and No signs of Nausea or vomiting  Post-op Vital Signs: Reviewed and stable  Complications: No apparent anesthesia complications

## 2012-04-11 NOTE — Transfer of Care (Signed)
Immediate Anesthesia Transfer of Care Note  Patient: [name]  Procedure(s) Performed: Procedure(s) (LRB) with comments: LAPAROSCOPIC SIGMOID COLECTOMY () - converted to Open with splenic flexure take down OPEN PARTIAL HEPATECTOMY [83] (Right) LAPAROSCOPY DIAGNOSTIC (N/A) CHOLECYSTECTOMY () ILEOCECETOMY (N/A)  Patient Location: PACU  Anesthesia Type:General  Level of Consciousness: awake and alert   Airway & Oxygen Therapy: Patient Spontanous Breathing and Patient connected to face mask oxygen  Post-op Assessment: Report given to PACU RN, Post -op Vital signs reviewed and stable and Patient moving all extremities  Post vital signs: Reviewed and stable  Complications: No apparent anesthesia complications

## 2012-04-11 NOTE — Op Note (Signed)
PREOPERATIVE DIAGNOSIS:  Metastatic colon cancer .  POSTOPERATIVE DIAGNOSIS:  same  PROCEDURE PERFORMED:  Partial right hepatectomy and cholecystectomy  SURGEON:  Almond Lint, MD  ASSISTANT:  Axel Filler, MD  ANESTHESIA:  General and local.  FINDINGS:  Large mass in right liver.  SPECIMEN:  Right lobe of the liver to Pathology. gallbladder  ESTIMATED BLOOD LOSS:  500 mL.  COMPLICATIONS:  None known  PROCEDURE:  Pt was identified in the holding area and taken to the operating room where she was placed supine on the operating room table.  General endotracheal anesthesia was induced.  Her abdomen was prepped and draped in a sterile fashion.  A time-out was performed according to the surgical safety check list.  When all was correct, we continued.   Dr Ramirez's op note will reflect the diagnostic laparoscopy and bowel resection.    Once the sigmoid cancer and small bowel were resected, the attention was directed to the upper abdomen.  The midline incision was extended to the xiphoid.   The Bookwalter was then placed for assistance with visualization.  The adhesions around the right lobe were taken down with cautery.   The adhesions were taken off the posterior abdominal wall.    The gastrohepatic ligament was opened.  There was no sign of any replaced left hepatic artery or replaced right hepatic artery.   A keeper was placed around the porta hepatis in case pringle maneuver was needed. The mass was in the posterior inferior right hepatic lobe.  The gallbladder was taken down with cautery and blunt dissection with the Overholdt clamp.  The cystic artery was divided with the ligasure and the cystic duct was clamped, divided, and suture ligated with a 2-0 Vicryl.   Attention was then directed to the liver.  The falciform ligament was taken down.      The liver was scored at the line of demarcation with the Bovie.  The harmonic scalpel wave was then used to divide the bulk of the  liver parenchyma.  The vascular pedicles were divided with Echelon staple loads, three of which were used on the segmental portal branch as well as the hepatic vein branch.  The liver was taken completely off.  There was a small area of bleeding which was sewn with a 2-0 vicryl.   The argon beam coagulator was used to coagulate the liver bed.    The case was turned back over to Dr. Derrell Lolling.  He proceeded with reanastamosis of terminal ileum and cecum as well as the colorectal anastamosis.  Once these were complete, the liver edge was reexamined.  There was no sign of continued bleeding and no biliary leakage was seen.    The abdomen was irrigated.    A 19 Blake drain was placed in the abdomen along the cut border of the liver.   Evicel  was then applied to the liver edge.  The omentum was also placed up over the top of the colon into this area.  The OnQ tunnelers were advanced through the abdominal wall via incisions made with #11 blade.  These were placed in the preperitoneal space.  #1 looped PDS suture was created in a running fashion to reapproximate the fascia.  The [place] drain was then in place with a 2-0 nylon.  The #12 port was closed internally with a 0-0 Vicryl figure of 8 suture.  The skin was then irrigated and stapled.  One of the laparoscopic port sites were used for the drain  and the other 2 were stapled as well.  The wounds were cleaned, dried and dressed with Covaderm.  The patient was extubated and taken to PACU in stable condition.     Almond Lint, MD

## 2012-04-11 NOTE — Anesthesia Preprocedure Evaluation (Signed)
Anesthesia Evaluation  Patient identified by MRN, date of birth, ID band Patient awake    Reviewed: Allergy & Precautions, H&P , NPO status , Patient's Chart, lab work & pertinent test results  Airway Mallampati: II TM Distance: >3 FB Neck ROM: Full    Dental No notable dental hx. (+) Teeth Intact and Dental Advisory Given   Pulmonary neg pulmonary ROS,  breath sounds clear to auscultation  Pulmonary exam normal       Cardiovascular hypertension, Rhythm:Regular Rate:Normal     Neuro/Psych negative neurological ROS  negative psych ROS   GI/Hepatic negative GI ROS, Neg liver ROS,   Endo/Other  negative endocrine ROS  Renal/GU negative Renal ROS  negative genitourinary   Musculoskeletal   Abdominal   Peds  Hematology  (+) anemia ,   Anesthesia Other Findings   Reproductive/Obstetrics negative OB ROS                           Anesthesia Physical Anesthesia Plan  ASA: III  Anesthesia Plan: General   Post-op Pain Management:    Induction: Intravenous  Airway Management Planned: Oral ETT  Additional Equipment: Arterial line  Intra-op Plan:   Post-operative Plan: Extubation in OR and Possible Post-op intubation/ventilation  Informed Consent: I have reviewed the patients History and Physical, chart, labs and discussed the procedure including the risks, benefits and alternatives for the proposed anesthesia with the patient or authorized representative who has indicated his/her understanding and acceptance.   Dental advisory given  Plan Discussed with: CRNA  Anesthesia Plan Comments:         Anesthesia Quick Evaluation

## 2012-04-12 ENCOUNTER — Encounter (HOSPITAL_COMMUNITY): Payer: Self-pay | Admitting: General Surgery

## 2012-04-12 LAB — PHOSPHORUS: Phosphorus: 3.3 mg/dL (ref 2.3–4.6)

## 2012-04-12 LAB — COMPREHENSIVE METABOLIC PANEL
AST: 140 U/L — ABNORMAL HIGH (ref 0–37)
Albumin: 2.9 g/dL — ABNORMAL LOW (ref 3.5–5.2)
Calcium: 8.7 mg/dL (ref 8.4–10.5)
Chloride: 101 mEq/L (ref 96–112)
Creatinine, Ser: 0.89 mg/dL (ref 0.50–1.10)
Total Bilirubin: 0.8 mg/dL (ref 0.3–1.2)
Total Protein: 6.2 g/dL (ref 6.0–8.3)

## 2012-04-12 LAB — CBC
HCT: 28 % — ABNORMAL LOW (ref 36.0–46.0)
MCHC: 31.8 g/dL (ref 30.0–36.0)
Platelets: 161 10*3/uL (ref 150–400)
RDW: 17.5 % — ABNORMAL HIGH (ref 11.5–15.5)
WBC: 19.7 10*3/uL — ABNORMAL HIGH (ref 4.0–10.5)

## 2012-04-12 NOTE — Significant Event (Signed)
1330pm-pt transferred safely to [facility]. VS stable prior and during the transfer. All pt belongings (a black bag, a jean pants, a t-shirt, a pair of socks, a pair of shoes) taken to patient's room. Pt daughter made aware of the transfer. Report given to receiving RN, Meriam Sprague. Lavonya Hoerner, Charity fundraiser.

## 2012-04-12 NOTE — Progress Notes (Signed)
1 Day Post-Op  Subjective: Pain controlled with minimal PCA use, standing IV tylenol, and OnQ pain pump.  No n/v  Objective: Vital signs in last 24 hours: Temp:  [97 F (36.1 C)-98.5 F (36.9 C)] 98.3 F (36.8 C) (12/17 0400) Pulse Rate:  [80-97] 87  (12/17 0700) Resp:  [9-30] 16  (12/17 0700) BP: (96-143)/(62-89) 113/73 mmHg (12/17 0700) SpO2:  [98 %-100 %] 100 % (12/17 0700) Arterial Line BP: (99-162)/(59-108) 120/108 mmHg (12/17 0700)    Intake/Output from previous day: 12/16 0701 - 12/17 0700 In: 7382.5 [P.O.:50; I.V.:5882.5; Blood:1050; IV Piggyback:400] Out: 1610 [Urine:825; Drains:185; Blood:600] Intake/Output this shift:    General appearance: alert, cooperative and no distress Resp: no respiratory distress GI: soft, approp tender.  minimal staining on dressing.  JP serosang Extremities: extremities normal, atraumatic, no cyanosis or edema  Lab Results:   Basename 04/12/12 0400 04/11/12 1400  WBC 19.7* 20.4*  HGB 8.9* 9.7*  HCT 28.0* 29.8*  PLT 161 289   BMET  Basename 04/12/12 0510 04/11/12 1330 04/11/12 1030  NA 135 -- 139  K 4.4 -- 3.9  CL 101 -- --  CO2 24 -- --  GLUCOSE 143* -- --  BUN 15 -- --  CREATININE 0.89 0.83 --  CALCIUM 8.7 -- --   PT/INR No results found for this basename: LABPROT:2,INR:2 in the last 72 hours ABG  Basename 04/11/12 1030  PHART 7.458*  HCO3 26.3*    Studies/Results: No results found.  Anti-infectives: Anti-infectives     Start     Dose/Rate Route Frequency Ordered Stop   04/11/12 2000   cefOXitin (MEFOXIN) 1 g in dextrose 5 % 50 mL IVPB        1 g 100 mL/hr over 30 Minutes Intravenous Every 6 hours 04/11/12 1413 04/12/12 1359   04/11/12 0600   metroNIDAZOLE (FLAGYL) IVPB 500 mg        500 mg 100 mL/hr over 60 Minutes Intravenous On call to O.R. 04/10/12 1326 04/11/12 0825   04/11/12 0600   cefTRIAXone (ROCEPHIN) 2 g in dextrose 5 % 50 mL IVPB     Comments: Pharmacy may adjust dosing strength per protocol      2 g 100 mL/hr over 30 Minutes Intravenous On call to O.R. 04/10/12 1326 04/11/12 0800          Assessment/Plan: s/p Procedure(s) (LRB) with comments: LAPAROSCOPIC SIGMOID COLECTOMY () - converted to Open with splenic flexure take down OPEN PARTIAL HEPATECTOMY [83] (Right) LAPAROSCOPY DIAGNOSTIC (N/A) CHOLECYSTECTOMY () ILEOCECETOMY (N/A) Continue foley due to strict I&O and urinary output monitoring Transfer to floor if up and out of bed.  Clear liq diet.   LOS: 1 day    [facility] 04/12/2012

## 2012-04-12 NOTE — Significant Event (Addendum)
Wasted approximately 1.1cc of morphine in sink and flushed with Marylu Lund, Charity fundraiser. New syringe of morphine for PCA replaced prior to transfer to new unit. Hyiu Ksor, Charity fundraiser.

## 2012-04-12 NOTE — Care Management Note (Signed)
  Page 1 of 1   04/12/2012     10:03:48 AM   CARE MANAGEMENT NOTE 04/12/2012  Patient:  [name],[name]   Account Number:  [id number]  Date Initiated:  04/12/2012  Documentation initiated by:  Verdis Prime  Subjective/Objective Assessment:   56 yr-old female adm with dx of colon cancer; lives alone, independent PTA     DC Planning Services  CM consult      Per UR Regulation:  Reviewed for med. necessity/level of care/duration of stay  Comments:  PCP: Dr Darden Palmer

## 2012-04-13 ENCOUNTER — Ambulatory Visit: Payer: Self-pay | Admitting: Internal Medicine

## 2012-04-13 LAB — COMPREHENSIVE METABOLIC PANEL
ALT: 104 U/L — ABNORMAL HIGH (ref 0–35)
Alkaline Phosphatase: 63 U/L (ref 39–117)
BUN: 11 mg/dL (ref 6–23)
CO2: 26 mEq/L (ref 19–32)
Chloride: 103 mEq/L (ref 96–112)
GFR calc Af Amer: 78 mL/min — ABNORMAL LOW (ref >90)
Glucose, Bld: 126 mg/dL — ABNORMAL HIGH (ref 70–99)
Potassium: 4.4 mEq/L (ref 3.5–5.1)
Sodium: 136 mEq/L (ref 135–145)
Total Bilirubin: 0.5 mg/dL (ref 0.3–1.2)

## 2012-04-13 LAB — CBC
HCT: 24.5 % — ABNORMAL LOW (ref 36.0–46.0)
Hemoglobin: 7.9 g/dL — ABNORMAL LOW (ref 12.0–15.0)
RBC: 2.92 MIL/uL — ABNORMAL LOW (ref 3.87–5.11)
WBC: 18.7 10*3/uL — ABNORMAL HIGH (ref 4.0–10.5)

## 2012-04-13 LAB — MAGNESIUM: Magnesium: 2.3 mg/dL (ref 1.5–2.5)

## 2012-04-13 NOTE — Progress Notes (Signed)
Offered to ambulate with pt in the hallway times three today.  Pt refused stating, "she had been up to the bathroom twice today and would walk in the hall tomorrow".  Will continue to monitor. Nino Glow RN

## 2012-04-13 NOTE — Progress Notes (Signed)
Pt H 7.9 with today's labs.  MD has been made aware.  No new orders given.  Will continue to monitor. Nino Glow RN

## 2012-04-13 NOTE — Progress Notes (Signed)
2 Days Post-Op  Subjective: Pt looks good today.  Having less abd soreness. Still no bowel function.  OOBTC yesterday  Objective: Vital signs in last 24 hours: Temp:  [98.2 F (36.8 C)-98.8 F (37.1 C)] 98.5 F (36.9 C) (12/18 0155) Pulse Rate:  [86-100] 100  (12/18 0155) Resp:  [11-18] 11  (12/18 0541) BP: (113-128)/(63-85) 120/63 mmHg (12/18 0155) SpO2:  [92 %-100 %] 95 % (12/18 0541) Arterial Line BP: (102-122)/(82-108) 104/84 mmHg (12/17 0915) FiO2 (%):  [42 %] 42 % (12/17 2000) Weight:  [239 lb (108.41 kg)] 239 lb (108.41 kg) (12/17 2300)    Intake/Output from previous day: 12/17 0701 - 12/18 0700 In: 2711.5 [P.O.:240; I.V.:2321.5; IV Piggyback:150] Out: 1165 [Urine:1015; Drains:150] Intake/Output this shift: Total I/O In: 1931 [I.V.:1931] Out: 670 [Urine:650; Drains:20]  General appearance: alert and cooperative GI: s/approp ttp/ND/ decreased BS wounds c/d/i, SS bulb drainage  Lab Results:   Basename 04/13/12 0555 04/12/12 0400  WBC 18.7* 19.7*  HGB 7.9* 8.9*  HCT 24.5* 28.0*  PLT 189 161   BMET  Basename 04/12/12 0510 04/11/12 1330 04/11/12 1030  NA 135 -- 139  K 4.4 -- 3.9  CL 101 -- --  CO2 24 -- --  GLUCOSE 143* -- --  BUN 15 -- --  CREATININE 0.89 0.83 --  CALCIUM 8.7 -- --   PT/INR No results found for this basename: LABPROT:2,INR:2 in the last 72 hours ABG  Basename 04/11/12 1030  PHART 7.458*  HCO3 26.3*    Studies/Results: No results found.  Anti-infectives: Anti-infectives     Start     Dose/Rate Route Frequency Ordered Stop   04/11/12 2000   cefOXitin (MEFOXIN) 1 g in dextrose 5 % 50 mL IVPB        1 g 100 mL/hr over 30 Minutes Intravenous Every 6 hours 04/11/12 1413 04/12/12 0928   04/11/12 0600   metroNIDAZOLE (FLAGYL) IVPB 500 mg        500 mg 100 mL/hr over 60 Minutes Intravenous On call to O.R. 04/10/12 1326 04/11/12 0825   04/11/12 0600   cefTRIAXone (ROCEPHIN) 2 g in dextrose 5 % 50 mL IVPB     Comments: Pharmacy may  adjust dosing strength per protocol      2 g 100 mL/hr over 30 Minutes Intravenous On call to O.R. 04/10/12 1326 04/11/12 0800          Assessment/Plan: s/p Procedure(s) (LRB) with comments: LAPAROSCOPIC SIGMOID COLECTOMY () - converted to Open with splenic flexure take down OPEN PARTIAL HEPATECTOMY [83] (Right) LAPAROSCOPY DIAGNOSTIC (N/A) CHOLECYSTECTOMY () ILEOCECETOMY (N/A) -D/C Foley -Mobilize -Con't Clears -Await bowel function   LOS: 2 days    Marigene Ehlers., [facility] 04/13/2012

## 2012-04-13 NOTE — Progress Notes (Signed)
2 Days Post-Op  Subjective: Pt a little more sore today.  No belching, n/v.  No flatus yet.  Transferred from ICU yesterday  Objective: Vital signs in last 24 hours: Temp:  [98.2 F (36.8 C)-98.8 F (37.1 C)] 98.3 F (36.8 C) (12/18 0645) Pulse Rate:  [87-100] 96  (12/18 0934) Resp:  [11-18] 18  (12/18 0800) BP: (113-146)/(63-85) 146/84 mmHg (12/18 0934) SpO2:  [92 %-100 %] 99 % (12/18 0800) FiO2 (%):  [42 %] 42 % (12/17 2000) Weight:  [239 lb (108.41 kg)] 239 lb (108.41 kg) (12/17 2300) Last BM Date: 04/10/12  Intake/Output from previous day: 12/17 0701 - 12/18 0700 In: 2711.5 [P.O.:240; I.V.:2321.5; IV Piggyback:150] Out: 1695 [Urine:1515; Drains:180] Intake/Output this shift:    General appearance: alert, cooperative and no distress Resp: no respiratory distress GI: soft, non distended.  approp tender.  drain serosang Extremities: extremities normal, atraumatic, no cyanosis or edema  Lab Results:   Basename 04/13/12 0555 04/12/12 0400  WBC 18.7* 19.7*  HGB 7.9* 8.9*  HCT 24.5* 28.0*  PLT 189 161   BMET  Basename 04/13/12 0555 04/12/12 0510  NA 136 135  K 4.4 4.4  CL 103 101  CO2 26 24  GLUCOSE 126* 143*  BUN 11 15  CREATININE 0.93 0.89  CALCIUM 9.0 8.7   PT/INR No results found for this basename: LABPROT:2,INR:2 in the last 72 hours ABG  Basename 04/11/12 1030  PHART 7.458*  HCO3 26.3*    Studies/Results: No results found.  Anti-infectives: Anti-infectives     Start     Dose/Rate Route Frequency Ordered Stop   04/11/12 2000   cefOXitin (MEFOXIN) 1 g in dextrose 5 % 50 mL IVPB        1 g 100 mL/hr over 30 Minutes Intravenous Every 6 hours 04/11/12 1413 04/12/12 0928   04/11/12 0600   metroNIDAZOLE (FLAGYL) IVPB 500 mg        500 mg 100 mL/hr over 60 Minutes Intravenous On call to O.R. 04/10/12 1326 04/11/12 0825   04/11/12 0600   cefTRIAXone (ROCEPHIN) 2 g in dextrose 5 % 50 mL IVPB     Comments: Pharmacy may adjust dosing strength per  protocol      2 g 100 mL/hr over 30 Minutes Intravenous On call to O.R. 04/10/12 1326 04/11/12 0800          Assessment/Plan: s/p Procedure(s) (LRB) with comments: LAPAROSCOPIC SIGMOID COLECTOMY () - converted to Open with splenic flexure take down OPEN PARTIAL HEPATECTOMY [83] (Right) LAPAROSCOPY DIAGNOSTIC (N/A) CHOLECYSTECTOMY () ILEOCECETOMY (N/A) d/c foley Clear liquids Norvasc for HTN lovenox for DVT prophylaxis Ambulate, pulmonary toilet. PCA, OnQ for pain control.    LOS: 2 days    [facility] 04/13/2012

## 2012-04-14 ENCOUNTER — Telehealth: Payer: Self-pay | Admitting: Oncology

## 2012-04-14 LAB — COMPREHENSIVE METABOLIC PANEL
ALT: 72 U/L — ABNORMAL HIGH (ref 0–35)
CO2: 26 mEq/L (ref 19–32)
Calcium: 9 mg/dL (ref 8.4–10.5)
Creatinine, Ser: 0.8 mg/dL (ref 0.50–1.10)
GFR calc Af Amer: >90 mL/min (ref >90)
GFR calc non Af Amer: 80 mL/min — ABNORMAL LOW (ref >90)
Glucose, Bld: 109 mg/dL — ABNORMAL HIGH (ref 70–99)
Sodium: 138 mEq/L (ref 135–145)
Total Protein: 6.2 g/dL (ref 6.0–8.3)

## 2012-04-14 LAB — CBC
Hemoglobin: 7.9 g/dL — ABNORMAL LOW (ref 12.0–15.0)
MCH: 26.6 pg (ref 26.0–34.0)
MCHC: 31.1 g/dL (ref 30.0–36.0)
MCV: 85.5 fL (ref 78.0–100.0)
Platelets: 213 10*3/uL (ref 150–400)

## 2012-04-14 LAB — PHOSPHORUS: Phosphorus: 2.7 mg/dL (ref 2.3–4.6)

## 2012-04-14 NOTE — Telephone Encounter (Signed)
12/16 pof forwarded to Tiffany in HIM - hosp f/u.

## 2012-04-14 NOTE — Progress Notes (Signed)
3 Days Post-Op  Subjective: Pt doing well.  Ambulated to RR yesterday x 3.  Less sore today.  Tol CLD still  Objective: Vital signs in last 24 hours: Temp:  [98.2 F (36.8 C)-98.6 F (37 C)] 98.2 F (36.8 C) (12/19 0221) Pulse Rate:  [90-113] 98  (12/19 0221) Resp:  [10-18] 14  (12/19 0357) BP: (124-159)/(75-85) 151/82 mmHg (12/19 0221) SpO2:  [94 %-100 %] 100 % (12/19 0357) Last BM Date: 04/10/12  Intake/Output from previous day: 12/18 0701 - 12/19 0700 In: 2923.2 [P.O.:840; I.V.:2083.2] Out: 425 [Urine:350; Drains:75] Intake/Output this shift: Total I/O In: 2083.2 [I.V.:2083.2] Out: 390 [Urine:350; Drains:40]  General appearance: alert and cooperative GI: soft, approp, ttp, nd, decreased BS  Lab Results:   Basename 04/13/12 0555 04/12/12 0400  WBC 18.7* 19.7*  HGB 7.9* 8.9*  HCT 24.5* 28.0*  PLT 189 161   BMET  Basename 04/13/12 0555 04/12/12 0510  NA 136 135  K 4.4 4.4  CL 103 101  CO2 26 24  GLUCOSE 126* 143*  BUN 11 15  CREATININE 0.93 0.89  CALCIUM 9.0 8.7   PT/INR No results found for this basename: LABPROT:2,INR:2 in the last 72 hours ABG  Basename 04/11/12 1030  PHART 7.458*  HCO3 26.3*    Studies/Results: No results found.  Anti-infectives: Anti-infectives     Start     Dose/Rate Route Frequency Ordered Stop   04/11/12 2000   cefOXitin (MEFOXIN) 1 g in dextrose 5 % 50 mL IVPB        1 g 100 mL/hr over 30 Minutes Intravenous Every 6 hours 04/11/12 1413 04/12/12 0928   04/11/12 0600   metroNIDAZOLE (FLAGYL) IVPB 500 mg        500 mg 100 mL/hr over 60 Minutes Intravenous On call to O.R. 04/10/12 1326 04/11/12 0825   04/11/12 0600   cefTRIAXone (ROCEPHIN) 2 g in dextrose 5 % 50 mL IVPB     Comments: Pharmacy may adjust dosing strength per protocol      2 g 100 mL/hr over 30 Minutes Intravenous On call to O.R. 04/10/12 1326 04/11/12 0800          Assessment/Plan: s/p Procedure(s) (LRB) with comments: LAPAROSCOPIC SIGMOID  COLECTOMY () - converted to Open with splenic flexure take down OPEN PARTIAL HEPATECTOMY [83] (Right) LAPAROSCOPY DIAGNOSTIC (N/A) CHOLECYSTECTOMY () ILEOCECETOMY (N/A) -con't to ambulate in [place] -Pulm toilet -await bowel function -con't CLD   LOS: 3 days    Marigene Ehlers., [facility] 04/14/2012

## 2012-04-14 NOTE — Telephone Encounter (Signed)
Pt r/s to 01/10 @ 1 w/Dr. Truett Perna.

## 2012-04-14 NOTE — Progress Notes (Signed)
Patient ID: [name], female   DOB: 1955-08-31, 56 y.o.   MRN: [id number] 3 Days Post-Op  Subjective: Continues to have belching and no flatus.  Tolerating clears.  Ambulated.    Objective: Vital signs in last 24 hours: Temp:  [98.2 F (36.8 C)-98.7 F (37.1 C)] 98.7 F (37.1 C) (12/19 0900) Pulse Rate:  [89-113] 89  (12/19 0900) Resp:  [10-18] 16  (12/19 0900) BP: (134-159)/(75-87) 154/87 mmHg (12/19 0900) SpO2:  [99 %-100 %] 100 % (12/19 0900) Last BM Date: 04/10/12  Intake/Output from previous day: 12/18 0701 - 12/19 0700 In: 2923.2 [P.O.:840; I.V.:2083.2] Out: 1100 [Urine:1000; Drains:100] Intake/Output this shift:    General appearance: alert and cooperative GI: soft, approp, ttp, nd, decreased BS  Lab Results:   [facility] 04/14/12 0555 04/13/12 0555  WBC 18.0* 18.7*  HGB 7.9* 7.9*  HCT 25.4* 24.5*  PLT 213 189   BMET  Basename 04/14/12 0555 04/13/12 0555  NA 138 136  K 4.3 4.4  CL 102 103  CO2 26 26  GLUCOSE 109* 126*  BUN 6 11  CREATININE 0.80 0.93  CALCIUM 9.0 9.0   PT/INR No results found for this basename: LABPROT:2,INR:2 in the last 72 hours ABG  Basename 04/11/12 1030  PHART 7.458*  HCO3 26.3*    Studies/Results: No results found.  Anti-infectives: Anti-infectives     Start     Dose/Rate Route Frequency Ordered Stop   04/11/12 2000   cefOXitin (MEFOXIN) 1 g in dextrose 5 % 50 mL IVPB        1 g 100 mL/hr over 30 Minutes Intravenous Every 6 hours 04/11/12 1413 04/12/12 0928   04/11/12 0600   metroNIDAZOLE (FLAGYL) IVPB 500 mg        500 mg 100 mL/hr over 60 Minutes Intravenous On call to O.R. 04/10/12 1326 04/11/12 0825   04/11/12 0600   cefTRIAXone (ROCEPHIN) 2 g in dextrose 5 % 50 mL IVPB     Comments: Pharmacy may adjust dosing strength per protocol      2 g 100 mL/hr over 30 Minutes Intravenous On call to O.R. 04/10/12 1326 04/11/12 0800          Assessment/Plan: s/p Procedure(s) (LRB) with comments: LAPAROSCOPIC  SIGMOID COLECTOMY () - converted to Open with splenic flexure take down OPEN PARTIAL HEPATECTOMY [83] (Right) LAPAROSCOPY DIAGNOSTIC (N/A) CHOLECYSTECTOMY () ILEOCECETOMY (N/A) - LOS: 3 days   Doing well. Await return of bowel function. PCA for pain control.    [facility] 04/14/2012

## 2012-04-15 ENCOUNTER — Ambulatory Visit: Payer: Self-pay

## 2012-04-15 ENCOUNTER — Telehealth: Payer: Self-pay | Admitting: Oncology

## 2012-04-15 ENCOUNTER — Inpatient Hospital Stay: Payer: Self-pay | Admitting: Oncology

## 2012-04-15 LAB — TYPE AND SCREEN
ABO/RH(D): A POS
Antibody Screen: NEGATIVE
Unit division: 0
Unit division: 0
Unit division: 0

## 2012-04-15 LAB — BASIC METABOLIC PANEL
CO2: 27 mEq/L (ref 19–32)
Chloride: 104 mEq/L (ref 96–112)
GFR calc Af Amer: >90 mL/min (ref >90)
Potassium: 4.3 mEq/L (ref 3.5–5.1)
Sodium: 138 mEq/L (ref 135–145)

## 2012-04-15 LAB — CBC
MCV: 84.7 fL (ref 78.0–100.0)
Platelets: 244 10*3/uL (ref 150–400)
RBC: 3.26 MIL/uL — ABNORMAL LOW (ref 3.87–5.11)
RDW: 16.7 % — ABNORMAL HIGH (ref 11.5–15.5)
WBC: 17.1 10*3/uL — ABNORMAL HIGH (ref 4.0–10.5)

## 2012-04-15 MED ORDER — HYDRALAZINE HCL 20 MG/ML IJ SOLN
10.0000 mg | Freq: Once | INTRAMUSCULAR | Status: AC
  Administered 2012-04-15: 10 mg via INTRAVENOUS
  Filled 2012-04-15: qty 0.5

## 2012-04-15 MED ORDER — BISACODYL 10 MG RE SUPP
10.0000 mg | Freq: Once | RECTAL | Status: AC
  Administered 2012-04-15: 10 mg via RECTAL
  Filled 2012-04-15: qty 1

## 2012-04-15 MED ORDER — BISACODYL 10 MG RE SUPP
10.0000 mg | Freq: Every day | RECTAL | Status: DC | PRN
  Administered 2012-04-16: 10 mg via RECTAL
  Filled 2012-04-15: qty 1

## 2012-04-15 NOTE — Telephone Encounter (Signed)
C/D 04/15/12 for appt.05/06/12

## 2012-04-15 NOTE — Progress Notes (Signed)
Patient ID: [name], female   DOB: 05/09/1955, 56 y.o.   MRN: [id number] 4 Days Post-Op  Subjective: Pt had flatus and small BM.  Tolerating full liquids.    Objective: Vital signs in last 24 hours: Temp:  [97.2 F (36.2 C)-98.7 F (37.1 C)] 98.1 F (36.7 C) (12/20 0348) Pulse Rate:  [88-98] 98  (12/20 0348) Resp:  [11-20] 11  (12/20 0750) BP: (134-158)/(80-101) 142/97 mmHg (12/20 0348) SpO2:  [97 %-100 %] 99 % (12/20 0750) FiO2 (%):  [39 %-45 %] 39 % (12/19 1530) Last BM Date: 04/14/12  Intake/Output from previous day: 12/19 0701 - 12/20 0700 In: 1768.3 [I.V.:1768.3] Out: 655 [Drains:655] Intake/Output this shift:    General appearance: alert and cooperative GI: soft, approp, ttp, nd, no erythema or drainage from wound.   Drain serosang.  Lab Results:   [facility] 04/15/12 0525 04/14/12 0555  WBC 17.1* 18.0*  HGB 8.8* 7.9*  HCT 27.6* 25.4*  PLT 244 213   BMET  Basename 04/15/12 0525 04/14/12 0555  NA 138 138  K 4.3 4.3  CL 104 102  CO2 27 26  GLUCOSE 132* 109*  BUN 7 6  CREATININE 0.82 0.80  CALCIUM 8.9 9.0   PT/INR No results found for this basename: LABPROT:2,INR:2 in the last 72 hours ABG No results found for this basename: PHART:2,PCO2:2,PO2:2,HCO3:2 in the last 72 hours  Studies/Results: No results found.  Anti-infectives: Anti-infectives     Start     Dose/Rate Route Frequency Ordered Stop   04/11/12 2000   cefOXitin (MEFOXIN) 1 g in dextrose 5 % 50 mL IVPB        1 g 100 mL/hr over 30 Minutes Intravenous Every 6 hours 04/11/12 1413 04/12/12 0928   04/11/12 0600   metroNIDAZOLE (FLAGYL) IVPB 500 mg        500 mg 100 mL/hr over 60 Minutes Intravenous On call to O.R. 04/10/12 1326 04/11/12 0825   04/11/12 0600   cefTRIAXone (ROCEPHIN) 2 g in dextrose 5 % 50 mL IVPB     Comments: Pharmacy may adjust dosing strength per protocol      2 g 100 mL/hr over 30 Minutes Intravenous On call to O.R. 04/10/12 1326 04/11/12 0800           Assessment/Plan: s/p Procedure(s) (LRB) with comments: LAPAROSCOPIC SIGMOID COLECTOMY () - converted to Open with splenic flexure take down OPEN PARTIAL HEPATECTOMY [83] (Right) LAPAROSCOPY DIAGNOSTIC (N/A) CHOLECYSTECTOMY () ILEOCECETOMY (N/A) - LOS: 4 days   Doing well. Medlock IVF and d/c PCA D/C OnQ Would plan on d/c blake drain prior to discharge.   Advance to reg diet in AM tomorrow. If tolerates, may be able to go home tomorrow.    [facility] 04/15/2012

## 2012-04-15 NOTE — Progress Notes (Signed)
4 Days Post-Op  Subjective: Pt doing well.  She ambulated in the hallway yesterday.  Had a small BM.  No real flatus.  Objective: Vital signs in last 24 hours: Temp:  [97.2 F (36.2 C)-98.7 F (37.1 C)] 98.1 F (36.7 C) (12/20 0348) Pulse Rate:  [88-98] 98  (12/20 0348) Resp:  [14-20] 14  (12/20 0350) BP: (134-158)/(80-101) 142/97 mmHg (12/20 0348) SpO2:  [97 %-100 %] 100 % (12/20 0350) FiO2 (%):  [39 %-45 %] 39 % (12/19 1530) Last BM Date: 04/10/12  Intake/Output from previous day: 12/19 0701 - 12/20 0700 In: 1768.3 [I.V.:1768.3] Out: 655 [Drains:655] Intake/Output this shift: Total I/O In: 748.3 [I.V.:748.3] Out: 365 [Drains:365]  General appearance: alert and cooperative GI: wound c/d/i, approp ttp, nd, active BS  Lab Results:   [facility] 04/15/12 0525 04/14/12 0555  WBC 17.1* 18.0*  HGB 8.8* 7.9*  HCT 27.6* 25.4*  PLT 244 213   BMET  Basename 04/14/12 0555 04/13/12 0555  NA 138 136  K 4.3 4.4  CL 102 103  CO2 26 26  GLUCOSE 109* 126*  BUN 6 11  CREATININE 0.80 0.93  CALCIUM 9.0 9.0   PT/INR No results found for this basename: LABPROT:2,INR:2 in the last 72 hours ABG No results found for this basename: PHART:2,PCO2:2,PO2:2,HCO3:2 in the last 72 hours  Studies/Results: No results found.  Anti-infectives: Anti-infectives     Start     Dose/Rate Route Frequency Ordered Stop   04/11/12 2000   cefOXitin (MEFOXIN) 1 g in dextrose 5 % 50 mL IVPB        1 g 100 mL/hr over 30 Minutes Intravenous Every 6 hours 04/11/12 1413 04/12/12 0928   04/11/12 0600   metroNIDAZOLE (FLAGYL) IVPB 500 mg        500 mg 100 mL/hr over 60 Minutes Intravenous On call to O.R. 04/10/12 1326 04/11/12 0825   04/11/12 0600   cefTRIAXone (ROCEPHIN) 2 g in dextrose 5 % 50 mL IVPB     Comments: Pharmacy may adjust dosing strength per protocol      2 g 100 mL/hr over 30 Minutes Intravenous On call to O.R. 04/10/12 1326 04/11/12 0800          Assessment/Plan: s/p  Procedure(s) (LRB) with comments: LAPAROSCOPIC SIGMOID COLECTOMY () - converted to Open with splenic flexure take down OPEN PARTIAL HEPATECTOMY [83] (Right) LAPAROSCOPY DIAGNOSTIC (N/A) CHOLECYSTECTOMY () ILEOCECETOMY (N/A)  Doing well Adv to FLD-can adv as bowel function returns Suppository Con't to mobilize PCA and On-Q for pain    LOS: 4 days    Marigene Ehlers., [facility] 04/15/2012

## 2012-04-16 NOTE — Progress Notes (Signed)
Patient ID: [name], female   DOB: 1955-09-13, 56 y.o.   MRN: [id number] 5 Days Post-Op  Subjective: Pt had flatus and small BM.  Tolerating diet but having a lot of gas pain.    Objective: Vital signs in last 24 hours: Temp:  [98.2 F (36.8 C)-99 F (37.2 C)] 98.2 F (36.8 C) (12/21 0605) Pulse Rate:  [88-92] 91  (12/21 0605) Resp:  [14-18] 18  (12/21 0605) BP: (144-163)/(66-88) 163/82 mmHg (12/21 0605) SpO2:  [97 %-99 %] 99 % (12/21 0605) Last BM Date: 04/14/12  Intake/Output from previous day: 12/20 0701 - 12/21 0700 In: 600 [I.V.:600] Out: 650 [Drains:650] Intake/Output this shift:    General appearance: alert and cooperative GI: soft, approp, ttp, nd, no erythema or drainage from wound.   Drain serosang.  Lab Results:   [facility] 04/15/12 0525 04/14/12 0555  WBC 17.1* 18.0*  HGB 8.8* 7.9*  HCT 27.6* 25.4*  PLT 244 213   BMET  Basename 04/15/12 0525 04/14/12 0555  NA 138 138  K 4.3 4.3  CL 104 102  CO2 27 26  GLUCOSE 132* 109*  BUN 7 6  CREATININE 0.82 0.80  CALCIUM 8.9 9.0   PT/INR No results found for this basename: LABPROT:2,INR:2 in the last 72 hours ABG No results found for this basename: PHART:2,PCO2:2,PO2:2,HCO3:2 in the last 72 hours  Studies/Results: No results found.  Anti-infectives: Anti-infectives     Start     Dose/Rate Route Frequency Ordered Stop   04/11/12 2000   cefOXitin (MEFOXIN) 1 g in dextrose 5 % 50 mL IVPB        1 g 100 mL/hr over 30 Minutes Intravenous Every 6 hours 04/11/12 1413 04/12/12 0928   04/11/12 0600   metroNIDAZOLE (FLAGYL) IVPB 500 mg        500 mg 100 mL/hr over 60 Minutes Intravenous On call to O.R. 04/10/12 1326 04/11/12 0825   04/11/12 0600   cefTRIAXone (ROCEPHIN) 2 g in dextrose 5 % 50 mL IVPB     Comments: Pharmacy may adjust dosing strength per protocol      2 g 100 mL/hr over 30 Minutes Intravenous On call to O.R. 04/10/12 1326 04/11/12 0800          Assessment/Plan: s/p Procedure(s)  (LRB) with comments: LAPAROSCOPIC SIGMOID COLECTOMY () - converted to Open with splenic flexure take down OPEN PARTIAL HEPATECTOMY [83] (Right) LAPAROSCOPY DIAGNOSTIC (N/A) CHOLECYSTECTOMY () ILEOCECETOMY (N/A) - LOS: 5 days   Doing well. Would plan on d/c blake drain prior to discharge.   Advance to reg diet Ambulate Possibly home tomorrow  [place], Helmut Muster C. 04/16/2012

## 2012-04-17 MED ORDER — POLYETHYLENE GLYCOL 3350 17 G PO PACK: 17.0000 g | PACK | Freq: Every day | ORAL | Status: DC

## 2012-04-17 MED ORDER — OXYCODONE-ACETAMINOPHEN 5-325 MG PO TABS: 1.0000 | ORAL_TABLET | ORAL | Status: DC | PRN

## 2012-04-17 MED ORDER — POLYETHYLENE GLYCOL 3350 17 G PO PACK
17.0000 g | PACK | Freq: Every day | ORAL | Status: DC
  Administered 2012-04-17: 17 g via ORAL
  Filled 2012-04-17: qty 1

## 2012-04-17 MED ORDER — OXYCODONE-ACETAMINOPHEN 5-325 MG PO TABS
1.0000 | ORAL_TABLET | ORAL | Status: DC | PRN
Start: 2012-04-17 — End: 2012-05-16

## 2012-04-17 NOTE — Progress Notes (Signed)
6 Days Post-Op  Subjective: Moving bowels  Looks well  Objective: Vital signs in last 24 hours: Temp:  [98.1 F (36.7 C)-98.6 F (37 C)] 98.4 F (36.9 C) (12/22 0500) Pulse Rate:  [85-103] 85  (12/22 0500) Resp:  [16-18] 16  (12/22 0500) BP: (137-152)/(60-90) 138/61 mmHg (12/22 0500) SpO2:  [98 %-100 %] 99 % (12/22 0500) Last BM Date: 04/16/12  Intake/Output from previous day: 12/21 0701 - 12/22 0700 In: 590 [P.O.:590] Out: 190 [Drains:190] Intake/Output this shift:    Incision/Wound:CLEAN DRY INTACT.  JP min  serous  Lab Results:   [facility] 04/15/12 0525  WBC 17.1*  HGB 8.8*  HCT 27.6*  PLT 244   BMET  Basename 04/15/12 0525  NA 138  K 4.3  CL 104  CO2 27  GLUCOSE 132*  BUN 7  CREATININE 0.82  CALCIUM 8.9   PT/INR No results found for this basename: LABPROT:2,INR:2 in the last 72 hours ABG No results found for this basename: PHART:2,PCO2:2,PO2:2,HCO3:2 in the last 72 hours  Studies/Results: No results found.  Anti-infectives: Anti-infectives     Start     Dose/Rate Route Frequency Ordered Stop   04/11/12 2000   cefOXitin (MEFOXIN) 1 g in dextrose 5 % 50 mL IVPB        1 g 100 mL/hr over 30 Minutes Intravenous Every 6 hours 04/11/12 1413 04/12/12 0928   04/11/12 0600   metroNIDAZOLE (FLAGYL) IVPB 500 mg        500 mg 100 mL/hr over 60 Minutes Intravenous On call to O.R. 04/10/12 1326 04/11/12 0825   04/11/12 0600   cefTRIAXone (ROCEPHIN) 2 g in dextrose 5 % 50 mL IVPB     Comments: Pharmacy may adjust dosing strength per protocol      2 g 100 mL/hr over 30 Minutes Intravenous On call to O.R. 04/10/12 1326 04/11/12 0800          Assessment/Plan: s/p Procedure(s) (LRB) with comments: LAPAROSCOPIC SIGMOID COLECTOMY () - converted to Open with splenic flexure take down OPEN PARTIAL HEPATECTOMY [83] (Right) LAPAROSCOPY DIAGNOSTIC (N/A) CHOLECYSTECTOMY () ILEOCECETOMY (N/A) Discharge  LOS: 6 days    Eleny Cortez A. 04/17/2012

## 2012-04-25 ENCOUNTER — Ambulatory Visit (INDEPENDENT_AMBULATORY_CARE_PROVIDER_SITE_OTHER): Payer: Medicaid Other | Admitting: General Surgery

## 2012-04-25 ENCOUNTER — Encounter (INDEPENDENT_AMBULATORY_CARE_PROVIDER_SITE_OTHER): Payer: Self-pay

## 2012-04-25 VITALS — BP 124/86 | HR 72 | Temp 98.1°F | Resp 16 | Ht 70.0 in | Wt 223.6 lb

## 2012-04-25 DIAGNOSIS — Z4802 Encounter for removal of sutures: Secondary | ICD-10-CM

## 2012-04-25 NOTE — Progress Notes (Signed)
Patient came in today for staple removal s/p lap sig colectomy.  Pt did not complain of any nausea, vomiting, fevers, or chills.  The incision site looked good and clean.  I cleaned the skin with chloraprep and then removed the staples.  I then proceeded by applying benzoin and steri-strips to the midline incision.  The patient has a f/u appt with Dr. Donell Beers on 05/03/12.

## 2012-04-26 ENCOUNTER — Encounter (HOSPITAL_COMMUNITY): Payer: Self-pay | Admitting: Psychiatry

## 2012-04-26 DIAGNOSIS — R4589 Other symptoms and signs involving emotional state: Secondary | ICD-10-CM | POA: Insufficient documentation

## 2012-04-26 DIAGNOSIS — Z9189 Other specified personal risk factors, not elsewhere classified: Secondary | ICD-10-CM | POA: Insufficient documentation

## 2012-04-28 ENCOUNTER — Telehealth (INDEPENDENT_AMBULATORY_CARE_PROVIDER_SITE_OTHER): Payer: Self-pay | Admitting: General Surgery

## 2012-04-28 NOTE — Telephone Encounter (Signed)
Pt called for refill of pain meds.  She understands she will get the refill protocol Hydrocodone 5/325 mg, # 30, 1-2 po Q4-6H prn pain, no refill called to [facility]:  [phone].

## 2012-04-29 NOTE — Discharge Summary (Signed)
Physician Discharge Summary  Patient ID: [name] MRN: [id number] DOB/AGE: 1956-04-19 57 y.o.  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  She did well and was able to be transferred to the floor. Her bowel function improved and she was able to transition to regular diet.  She was able to void on her own.  She was able to ambulate independently.  Her drain was non bilious and was removed.  She was transitioned to oral narcotics.    Consults: None  Significant Diagnostic Studies: labs: HCT 27.6 prior to d/c, Cr normal, Transaminases trending down.    Treatments: surgery: lap sigmoid colectomy and open right hepatectomy.  Discharge Exam: Blood pressure 138/61, pulse 85, temperature 98.4 F (36.9 C), temperature source Oral, resp. rate 16, height 5\' 10"  (1.778 m), weight 239 lb (108.41 kg), SpO2 99.00%. General appearance: alert, cooperative and no distress Resp: breathing comfortably GI: soft, approp tender, non distended Extremities: extremities normal, atraumatic, no cyanosis or edema  Disposition: 01-Home or Self Care  Discharge Orders    Future Appointments: Provider: Department: Dept Phone: Center:   05/03/2012 3:45 PM Almond Lint, MD [facility] [phone] None   05/06/2012 1:00 PM Ladene Artist, MD [facility] [phone] None     Future Orders Please Complete By Expires   Diet - low sodium heart healthy      Increase activity slowly          Medication List     As of 04/29/2012  5:40 PM    TAKE these medications         amLODipine 10 MG tablet   Commonly known as: NORVASC   Take 1 tablet (10 mg total) by mouth daily.      ferrous sulfate 325 (65 FE) MG tablet   Take  325 mg by mouth 3 (three) times daily with meals.      oxyCODONE-acetaminophen 5-325 MG per tablet   Commonly known as: PERCOCET/ROXICET   Take 1-2 tablets by mouth every 4 (four) hours as needed.      oxyCODONE-acetaminophen 5-325 MG per tablet   Commonly known as: PERCOCET/ROXICET   Take 1 tablet by mouth every 4 (four) hours as needed for pain.      polyethylene glycol packet   Commonly known as: MIRALAX / GLYCOLAX   Take 17 g by mouth daily.           Follow-up Information    Follow up with [facility] [place], MD. Call in 5 days.   Contact information:   [address] [place] [address] [phone]          Signed: Almond Lint 04/29/2012, 5:40 PM

## 2012-05-03 ENCOUNTER — Encounter (INDEPENDENT_AMBULATORY_CARE_PROVIDER_SITE_OTHER): Payer: Medicaid Other | Admitting: General Surgery

## 2012-05-05 ENCOUNTER — Other Ambulatory Visit (HOSPITAL_COMMUNITY)
Admission: RE | Admit: 2012-05-05 | Discharge: 2012-05-05 | Disposition: A | Discharge status: Home / Self Care | Payer: Medicaid Other | Source: Ambulatory Visit | Attending: Oncology | Admitting: Oncology

## 2012-05-05 DIAGNOSIS — C189 Malignant neoplasm of colon, unspecified: Secondary | ICD-10-CM | POA: Insufficient documentation

## 2012-05-05 DIAGNOSIS — C801 Malignant (primary) neoplasm, unspecified: Secondary | ICD-10-CM | POA: Insufficient documentation

## 2012-05-06 ENCOUNTER — Telehealth: Payer: Self-pay | Admitting: *Deleted

## 2012-05-06 ENCOUNTER — Telehealth: Payer: Self-pay | Admitting: Oncology

## 2012-05-06 ENCOUNTER — Inpatient Hospital Stay: Payer: Self-pay | Admitting: Oncology

## 2012-05-06 NOTE — Telephone Encounter (Signed)
Nurse notified of rescheduled appt from today to 05/16/12

## 2012-05-06 NOTE — Telephone Encounter (Signed)
Left voice message for the patient to call this RN back to make sure her transportation is not a barrier to care and to assess for assist as needed.  This RN's name and phone number was left on the voice message for the patient asking her to please call back.

## 2012-05-06 NOTE — Telephone Encounter (Signed)
Pt cancelled appt today and r/s to 05/16/12, patient has no ride

## 2012-05-16 ENCOUNTER — Ambulatory Visit (HOSPITAL_BASED_OUTPATIENT_CLINIC_OR_DEPARTMENT_OTHER): Payer: Medicaid Other | Admitting: Oncology

## 2012-05-16 ENCOUNTER — Ambulatory Visit (HOSPITAL_COMMUNITY)
Admission: RE | Admit: 2012-05-16 | Discharge: 2012-05-16 | Disposition: A | Discharge status: Home / Self Care | Payer: Medicaid Other | Source: Ambulatory Visit | Attending: Oncology | Admitting: Oncology

## 2012-05-16 ENCOUNTER — Telehealth: Payer: Self-pay | Admitting: *Deleted

## 2012-05-16 ENCOUNTER — Ambulatory Visit (HOSPITAL_BASED_OUTPATIENT_CLINIC_OR_DEPARTMENT_OTHER): Payer: Medicaid Other | Admitting: Lab

## 2012-05-16 ENCOUNTER — Telehealth (INDEPENDENT_AMBULATORY_CARE_PROVIDER_SITE_OTHER): Payer: Self-pay

## 2012-05-16 VITALS — BP 165/97 | HR 79 | Temp 98.0°F | Resp 20 | Ht 70.0 in | Wt 223.9 lb

## 2012-05-16 DIAGNOSIS — C189 Malignant neoplasm of colon, unspecified: Secondary | ICD-10-CM

## 2012-05-16 DIAGNOSIS — C187 Malignant neoplasm of sigmoid colon: Secondary | ICD-10-CM

## 2012-05-16 DIAGNOSIS — D509 Iron deficiency anemia, unspecified: Secondary | ICD-10-CM

## 2012-05-16 DIAGNOSIS — C787 Secondary malignant neoplasm of liver and intrahepatic bile duct: Secondary | ICD-10-CM

## 2012-05-16 LAB — COMPREHENSIVE METABOLIC PANEL (CC13)
AST: 16 U/L (ref 5–34)
Albumin: 3.7 g/dL (ref 3.5–5.0)
Alkaline Phosphatase: 82 U/L (ref 40–150)
Potassium: 3.4 mEq/L — ABNORMAL LOW (ref 3.5–5.1)
Sodium: 141 mEq/L (ref 136–145)
Total Protein: 8.2 g/dL (ref 6.4–8.3)

## 2012-05-16 LAB — CBC WITH DIFFERENTIAL/PLATELET
BASO%: 1.1 % (ref 0.0–2.0)
EOS%: 1.4 % (ref 0.0–7.0)
HGB: 10.1 g/dL — ABNORMAL LOW (ref 11.6–15.9)
MCH: 24.7 pg — ABNORMAL LOW (ref 25.1–34.0)
MCHC: 32 g/dL (ref 31.5–36.0)
RDW: 18.4 % — ABNORMAL HIGH (ref 11.2–14.5)
lymph#: 1.9 10*3/uL (ref 0.9–3.3)

## 2012-05-16 LAB — CEA: CEA: 10.3 ng/mL — ABNORMAL HIGH (ref 0.0–5.0)

## 2012-05-16 NOTE — Telephone Encounter (Signed)
Per request of Dr. Truett Perna, this RN Spoke with Dr. Arita Miss office, Cyndra Numbers, regarding the patient's port-a-cath and that the CXR report from today stated " the Port-A-Cath tip to be in the azygos vein and needs to be repositioned".  Cyndra Numbers will contact Dr. Donell Beers and make her aware.

## 2012-05-16 NOTE — Progress Notes (Signed)
Met with patient and reviewed role of nurse navigator.  Patient education provided on colorectal cancer.  Education sheets on CAPEOX given to patient.  Referral made to dietician and SW.  Cammy Copa from SW met with patient to address transportation barriers.  Contact names and phone numbers provided to patient.  She verbalized understanding and was appreciative of care.  Will continue to follow.

## 2012-05-16 NOTE — Telephone Encounter (Signed)
This should go to Dr.Ramirez.  He placed port a cath.

## 2012-05-16 NOTE — Telephone Encounter (Signed)
Pt's PAC will need to be repositioned.  Please see Dr. Kalman Drape note regarding x-ray.

## 2012-05-16 NOTE — Progress Notes (Signed)
Thank you for letting me know!  I tried calling her home number again today and it went straight to voicemail.

## 2012-05-16 NOTE — Telephone Encounter (Signed)
Per staff phone call and POF I have schedueld appts.  JMW  

## 2012-05-16 NOTE — Telephone Encounter (Signed)
Dr. Kalman Drape office called requesting pt's PAC be repositioned.  Message passed on to Dr. Donell Beers.

## 2012-05-16 NOTE — Progress Notes (Signed)
   [facility]    OFFICE PROGRESS NOTE   INTERVAL HISTORY:   She returns as scheduled. She was taken the operating room on 04/11/2012 and underwent a diagnostic laparoscopy, sigmoid colectomy, and partial right hepatectomy. The pathology [id number]) confirmed a colonic adenocarcinoma measuring 6 cm. Tumor extended to the serosal surface and into the attached ileum. The proximal and distal margins were negative. 11 benign lymph nodes. Metastatic colonic adenocarcinoma involving the right liver resection measuring 7.2 cm. The margins were negative. Resection of the cecum, terminal ileum, and appendix revealed focal serosal fibrous adhesions and 4 benign lymph nodes. No tumor identified. Lymphovascular and perineural invasion were noted.  She reports an uneventful operative recovery. A Port-A-Cath was placed 03/14/2012.  Objective:  Vital signs in last 24 hours:  Blood pressure 165/97, pulse 79, temperature 98 F (36.7 C), temperature source Oral, resp. rate 20, height 5\' 10"  (1.778 m), weight 223 lb 14.4 oz (101.56 kg).    HEENT: Neck without mass Lymphatics: No cervical, supra-clavicular, axillary, or inguinal nodes Resp: Lungs clear bilaterally Cardio: Regular rate and rhythm GI: No hepatomegaly, healed incision, no mass Vascular: No leg edema   Portacath/PICC-without erythema  Lab Results:  Lab Results  Component Value Date   WBC 7.7 05/16/2012   HGB 10.1* 05/16/2012   HCT 31.6* 05/16/2012   MCV 77.2* 05/16/2012   PLT 246 05/16/2012   ANC 5.3   Medications: I have reviewed the patient's current medications.  Assessment/Plan:  1. Metastatic colon cancer (T4, N0, M1) adenocarcinoma of the sigmoid colon, status post a sigmoid colectomy and partial right hepatectomy 04/11/2012  2. Microcytic anemia  3. Markedly elevated preoperative CEA  4. Status post Port-A-Cath placement 03/14/2012-and x-ray today confirms the Port-A-Cath tip to be in the azygos  vein  Disposition:  She has been diagnosed with metastatic colon cancer. She underwent a partial hepatectomy and sigmoid colectomy. No evidence for additional metastatic disease based on the imaging studies to date. We obtained a followup CEA level today.  She is at high-risk of developing recurrent colon cancer. I explained there is no "standard "recommended systemic chemotherapy regimen in this setting. I recommend treatment with 5-fluorouracil and oxaliplatin. She has difficulty with transportation. It will be easier for her to receive CAPOX as opposed to FOLFOX chemotherapy.  The plan is to initiate CAPOX chemotherapy during the week of 05/23/2012. I reviewed the potential toxicities associated with this chemotherapy regimen including the chance for nausea/vomiting, mucositis, diarrhea, and hematologic toxicity. We discussed the skin rash, skin hyperpigmentation, and hand/foot syndrome associated with capecitabine. We reviewed the various types of neuropathy associated with oxaliplatin. She will attend chemotherapy teaching class.  Her case will be presented at the GI tumor conference on 05/18/2012. We will decide on the indication for additional imaging studies based on this case presentation and the repeat CEA level. She is scheduled for an office visit prior to chemotherapy on 05/25/2012.   Thornton Papas, MD  05/16/2012  2:01 PM

## 2012-05-17 ENCOUNTER — Other Ambulatory Visit: Payer: Self-pay | Admitting: *Deleted

## 2012-05-17 ENCOUNTER — Other Ambulatory Visit (INDEPENDENT_AMBULATORY_CARE_PROVIDER_SITE_OTHER): Payer: Self-pay | Admitting: General Surgery

## 2012-05-17 ENCOUNTER — Telehealth (INDEPENDENT_AMBULATORY_CARE_PROVIDER_SITE_OTHER): Payer: Self-pay | Admitting: General Surgery

## 2012-05-17 ENCOUNTER — Encounter (HOSPITAL_COMMUNITY): Payer: Self-pay | Admitting: Pharmacy Technician

## 2012-05-17 ENCOUNTER — Other Ambulatory Visit: Payer: Self-pay | Admitting: Internal Medicine

## 2012-05-17 MED ORDER — AMLODIPINE BESYLATE 10 MG PO TABS: 10.0000 mg | ORAL_TABLET | Freq: Every day | ORAL | Status: DC

## 2012-05-17 NOTE — Telephone Encounter (Signed)
LMOM telling patient to hold any blood thinners 5 days prior to her PAC repositioning and that I was taking the orders to the schedulers now so they should be contacting her soon

## 2012-05-17 NOTE — Telephone Encounter (Signed)
Amlodipine rx called to [facility].

## 2012-05-18 ENCOUNTER — Encounter: Payer: Self-pay | Admitting: *Deleted

## 2012-05-18 ENCOUNTER — Other Ambulatory Visit: Payer: Medicaid Other

## 2012-05-18 ENCOUNTER — Other Ambulatory Visit: Payer: Self-pay | Admitting: *Deleted

## 2012-05-18 MED ORDER — LIDOCAINE-PRILOCAINE 2.5-2.5 % EX CREA: TOPICAL_CREAM | CUTANEOUS | Status: DC

## 2012-05-18 MED ORDER — PROCHLORPERAZINE MALEATE 10 MG PO TABS: 10.0000 mg | ORAL_TABLET | Freq: Four times a day (QID) | ORAL | Status: DC | PRN

## 2012-05-19 ENCOUNTER — Encounter (HOSPITAL_COMMUNITY): Payer: Self-pay | Admitting: Pharmacy Technician

## 2012-05-19 ENCOUNTER — Telehealth (INDEPENDENT_AMBULATORY_CARE_PROVIDER_SITE_OTHER): Payer: Self-pay | Admitting: General Surgery

## 2012-05-19 NOTE — Telephone Encounter (Signed)
Pt called to ask why she was scheduled to go back to surgery for her PAC.  After reviewing her records, I advised her that the [facility] needs to be slightly repositioned and to do this safely to prevent infection, it needed to be done in the OR.  She understands and will be there on 05/23/12 for the procedure.

## 2012-05-20 ENCOUNTER — Other Ambulatory Visit: Payer: Self-pay | Admitting: *Deleted

## 2012-05-20 ENCOUNTER — Encounter (HOSPITAL_COMMUNITY)
Admission: RE | Admit: 2012-05-20 | Discharge: 2012-05-20 | Disposition: A | Discharge status: Home / Self Care | Payer: Medicaid Other | Source: Ambulatory Visit | Attending: General Surgery | Admitting: General Surgery

## 2012-05-20 ENCOUNTER — Encounter (HOSPITAL_COMMUNITY): Payer: Self-pay

## 2012-05-20 LAB — SURGICAL PCR SCREEN
MRSA, PCR: NEGATIVE
Staphylococcus aureus: NEGATIVE

## 2012-05-20 MED ORDER — CAPECITABINE 500 MG PO TABS: 2000.0000 mg | ORAL_TABLET | Freq: Two times a day (BID) | ORAL | Status: DC

## 2012-05-20 MED ORDER — PROCHLORPERAZINE MALEATE 10 MG PO TABS: 10.0000 mg | ORAL_TABLET | Freq: Four times a day (QID) | ORAL | Status: DC | PRN

## 2012-05-20 MED ORDER — LIDOCAINE-PRILOCAINE 2.5-2.5 % EX CREA: TOPICAL_CREAM | CUTANEOUS | Status: DC | PRN

## 2012-05-20 NOTE — Pre-Procedure Instructions (Signed)
[name]  05/20/2012   Your procedure is scheduled on:  Monday, January 27  Report to [facility] at 1030 AM.  Call this number if you have problems the morning of surgery: [phone]   Remember:   Do not eat food or drink liquids after midnight.Sunday night   Take these medicines the morning of surgery with A SIP OF WATER: Amlodipine, Hydrocodone if needed   Do not wear jewelry, make-up or nail polish.  Do not wear lotions, powders, or perfumes.   Do not shave 48 hours prior to surgery.   Do not bring valuables to the hospital.  Contacts, dentures or bridgework may not be worn into surgery.         Patients discharged the day of surgery will not be allowed to drive  home.  Name and phone number of your driver:pt will take a taxi home; will be accompanied with a family member Special Instructions: Shower using CHG 2 nights before surgery and the night before surgery.  If you shower the day of surgery use CHG.  Use special wash - you have one bottle of CHG for all showers.  You should use approximately 1/3 of the bottle for each shower.   Please read over the following fact sheets that you were given: Pain Booklet, Coughing and Deep Breathing, MRSA Information and Surgical Site Infection Prevention

## 2012-05-22 ENCOUNTER — Other Ambulatory Visit: Payer: Self-pay | Admitting: Oncology

## 2012-05-22 MED ORDER — CEFAZOLIN SODIUM-DEXTROSE 2-3 GM-% IV SOLR
2.0000 g | INTRAVENOUS | Status: AC
  Administered 2012-05-23: 2 g via INTRAVENOUS
  Filled 2012-05-22: qty 50

## 2012-05-23 ENCOUNTER — Encounter (HOSPITAL_COMMUNITY): Payer: Self-pay | Admitting: Certified Registered"

## 2012-05-23 ENCOUNTER — Ambulatory Visit (HOSPITAL_COMMUNITY): Payer: Medicaid Other

## 2012-05-23 ENCOUNTER — Ambulatory Visit (HOSPITAL_COMMUNITY)
Admission: RE | Admit: 2012-05-23 | Discharge: 2012-05-23 | Disposition: A | Discharge status: Home / Self Care | Payer: Medicaid Other | Source: Ambulatory Visit | Attending: General Surgery | Admitting: General Surgery

## 2012-05-23 ENCOUNTER — Ambulatory Visit (HOSPITAL_COMMUNITY): Payer: Medicaid Other | Admitting: Certified Registered"

## 2012-05-23 ENCOUNTER — Encounter (HOSPITAL_COMMUNITY)
Admission: RE | Disposition: A | Discharge status: Home / Self Care | Payer: Self-pay | Source: Ambulatory Visit | Attending: General Surgery

## 2012-05-23 DIAGNOSIS — C189 Malignant neoplasm of colon, unspecified: Secondary | ICD-10-CM

## 2012-05-23 DIAGNOSIS — I10 Essential (primary) hypertension: Secondary | ICD-10-CM | POA: Insufficient documentation

## 2012-05-23 DIAGNOSIS — C787 Secondary malignant neoplasm of liver and intrahepatic bile duct: Secondary | ICD-10-CM | POA: Insufficient documentation

## 2012-05-23 DIAGNOSIS — L259 Unspecified contact dermatitis, unspecified cause: Secondary | ICD-10-CM | POA: Insufficient documentation

## 2012-05-23 DIAGNOSIS — Z90710 Acquired absence of both cervix and uterus: Secondary | ICD-10-CM | POA: Insufficient documentation

## 2012-05-23 DIAGNOSIS — T82898A Other specified complication of vascular prosthetic devices, implants and grafts, initial encounter: Secondary | ICD-10-CM

## 2012-05-23 DIAGNOSIS — T82598A Other mechanical complication of other cardiac and vascular devices and implants, initial encounter: Secondary | ICD-10-CM | POA: Insufficient documentation

## 2012-05-23 DIAGNOSIS — Y849 Medical procedure, unspecified as the cause of abnormal reaction of the patient, or of later complication, without mention of misadventure at the time of the procedure: Secondary | ICD-10-CM | POA: Insufficient documentation

## 2012-05-23 HISTORY — PX: PORT A CATH REVISION: SHX6033

## 2012-05-23 SURGERY — PORT A CATH REVISION
Anesthesia: General | Site: Chest | Laterality: Right | Wound class: Clean

## 2012-05-23 MED ORDER — BUPIVACAINE HCL 0.25 % IJ SOLN
INTRAMUSCULAR | Status: DC | PRN
  Administered 2012-05-23: 4 mL

## 2012-05-23 MED ORDER — OXYCODONE HCL 5 MG/5ML PO SOLN: 5.0000 mg | Freq: Once | ORAL | Status: DC | PRN

## 2012-05-23 MED ORDER — PROPOFOL 10 MG/ML IV BOLUS
INTRAVENOUS | Status: DC | PRN
  Administered 2012-05-23: 200 mg via INTRAVENOUS

## 2012-05-23 MED ORDER — FENTANYL CITRATE 0.05 MG/ML IJ SOLN
INTRAMUSCULAR | Status: DC | PRN
  Administered 2012-05-23 (×3): 25 ug via INTRAVENOUS
  Administered 2012-05-23: 100 ug via INTRAVENOUS
  Administered 2012-05-23: 25 ug via INTRAVENOUS

## 2012-05-23 MED ORDER — LIDOCAINE HCL (CARDIAC) 20 MG/ML IV SOLN
INTRAVENOUS | Status: DC | PRN
  Administered 2012-05-23: 80 mg via INTRAVENOUS

## 2012-05-23 MED ORDER — HYDROMORPHONE HCL PF 1 MG/ML IJ SOLN
0.2500 mg | INTRAMUSCULAR | Status: DC | PRN
  Administered 2012-05-23 (×2): 0.5 mg via INTRAVENOUS

## 2012-05-23 MED ORDER — HEPARIN SOD (PORK) LOCK FLUSH 100 UNIT/ML IV SOLN
INTRAVENOUS | Status: AC
  Filled 2012-05-23: qty 5

## 2012-05-23 MED ORDER — OXYCODONE HCL 5 MG PO TABS: 5.0000 mg | ORAL_TABLET | Freq: Once | ORAL | Status: DC | PRN

## 2012-05-23 MED ORDER — ARTIFICIAL TEARS OP OINT
TOPICAL_OINTMENT | OPHTHALMIC | Status: DC | PRN
  Administered 2012-05-23: 1 via OPHTHALMIC

## 2012-05-23 MED ORDER — BUPIVACAINE HCL (PF) 0.25 % IJ SOLN
INTRAMUSCULAR | Status: AC
  Filled 2012-05-23: qty 30

## 2012-05-23 MED ORDER — LACTATED RINGERS IV SOLN
INTRAVENOUS | Status: DC | PRN
  Administered 2012-05-23: 07:00:00 via INTRAVENOUS

## 2012-05-23 MED ORDER — HEPARIN SODIUM (PORCINE) 5000 UNIT/ML IJ SOLN
INTRAMUSCULAR | Status: DC | PRN
  Administered 2012-05-23: 08:00:00

## 2012-05-23 MED ORDER — PHENYLEPHRINE HCL 10 MG/ML IJ SOLN
INTRAMUSCULAR | Status: DC | PRN
  Administered 2012-05-23: 40 ug via INTRAVENOUS
  Administered 2012-05-23 (×2): 80 ug via INTRAVENOUS

## 2012-05-23 MED ORDER — MIDAZOLAM HCL 5 MG/5ML IJ SOLN
INTRAMUSCULAR | Status: DC | PRN
  Administered 2012-05-23: 2 mg via INTRAVENOUS

## 2012-05-23 MED ORDER — HYDROMORPHONE HCL PF 1 MG/ML IJ SOLN
INTRAMUSCULAR | Status: AC
  Administered 2012-05-23: 0.5 mg via INTRAVENOUS
  Filled 2012-05-23: qty 1

## 2012-05-23 MED ORDER — OXYCODONE-ACETAMINOPHEN 5-325 MG PO TABS: 1.0000 | ORAL_TABLET | ORAL | Status: DC | PRN

## 2012-05-23 MED ORDER — CHLORHEXIDINE GLUCONATE 4 % EX LIQD: 1.0000 "application " | Freq: Once | CUTANEOUS | Status: DC

## 2012-05-23 MED ORDER — ONDANSETRON HCL 4 MG/2ML IJ SOLN
INTRAMUSCULAR | Status: DC | PRN
  Administered 2012-05-23: 4 mg via INTRAVENOUS

## 2012-05-23 MED ORDER — ONDANSETRON HCL 4 MG/2ML IJ SOLN: 4.0000 mg | Freq: Once | INTRAMUSCULAR | Status: DC | PRN

## 2012-05-23 SURGICAL SUPPLY — 57 items
BAG DECANTER FOR FLEXI CONT (MISCELLANEOUS) ×2 IMPLANT
BLADE SURG 10 STRL SS (BLADE) ×2 IMPLANT
BLADE SURG 15 STRL LF DISP TIS (BLADE) ×1 IMPLANT
BLADE SURG 15 STRL SS (BLADE) ×2
BLADE SURG ROTATE 9660 (MISCELLANEOUS) IMPLANT
CANISTER SUCTION 2500CC (MISCELLANEOUS) IMPLANT
CHLORAPREP W/TINT 10.5 ML (MISCELLANEOUS) ×2 IMPLANT
CLOTH BEACON ORANGE TIMEOUT ST (SAFETY) ×2 IMPLANT
COVER MAYO STAND STRL (DRAPES) ×1 IMPLANT
COVER SURGICAL LIGHT HANDLE (MISCELLANEOUS) ×2 IMPLANT
CRADLE DONUT ADULT HEAD (MISCELLANEOUS) ×2 IMPLANT
DECANTER SPIKE VIAL GLASS SM (MISCELLANEOUS) ×2 IMPLANT
DRAPE C-ARM 42X72 X-RAY (DRAPES) ×2 IMPLANT
DRAPE CHEST BREAST 15X10 FENES (DRAPES) ×2 IMPLANT
DRAPE INCISE IOBAN 66X45 STRL (DRAPES) ×1 IMPLANT
DRAPE UTILITY 15X26 W/TAPE STR (DRAPE) ×4 IMPLANT
ELECT CAUTERY BLADE 6.4 (BLADE) ×2 IMPLANT
ELECT REM PT RETURN 9FT ADLT (ELECTROSURGICAL) ×2
ELECTRODE REM PT RTRN 9FT ADLT (ELECTROSURGICAL) ×1 IMPLANT
GAUZE SPONGE 4X4 16PLY XRAY LF (GAUZE/BANDAGES/DRESSINGS) ×2 IMPLANT
GLOVE BIO SURGEON STRL SZ7.5 (GLOVE) ×2 IMPLANT
GLOVE BIOGEL PI IND STRL 7.0 (GLOVE) IMPLANT
GLOVE BIOGEL PI IND STRL 8 (GLOVE) ×1 IMPLANT
GLOVE BIOGEL PI INDICATOR 7.0 (GLOVE) ×1
GLOVE BIOGEL PI INDICATOR 8 (GLOVE) ×1
GOWN STRL NON-REIN LRG LVL3 (GOWN DISPOSABLE) ×4 IMPLANT
GOWN STRL REIN XL XLG (GOWN DISPOSABLE) ×2 IMPLANT
INTRODUCER 13FR (MISCELLANEOUS) IMPLANT
INTRODUCER COOK 11FR (CATHETERS) IMPLANT
KIT BASIN OR (CUSTOM PROCEDURE TRAY) ×2 IMPLANT
KIT PORT POWER ISP 8FR (Catheter) IMPLANT
KIT POWER CATH 8FR (Catheter) ×1 IMPLANT
KIT ROOM TURNOVER OR (KITS) ×2 IMPLANT
NDL HYPO 25GX1X1/2 BEV (NEEDLE) ×1 IMPLANT
NEEDLE HYPO 25GX1X1/2 BEV (NEEDLE) ×2 IMPLANT
NS IRRIG 1000ML POUR BTL (IV SOLUTION) ×2 IMPLANT
PACK SURGICAL SETUP 50X90 (CUSTOM PROCEDURE TRAY) ×2 IMPLANT
PAD ARMBOARD 7.5X6 YLW CONV (MISCELLANEOUS) ×2 IMPLANT
PENCIL BUTTON HOLSTER BLD 10FT (ELECTRODE) ×2 IMPLANT
SET INTRODUCER 12FR PACEMAKER (SHEATH) IMPLANT
SET SHEATH INTRODUCER 10FR (MISCELLANEOUS) IMPLANT
SHEATH COOK PEEL AWAY SET 9F (SHEATH) IMPLANT
SUT MNCRL AB 3-0 PS2 18 (SUTURE) ×2 IMPLANT
SUT MNCRL AB 4-0 PS2 18 (SUTURE) ×1 IMPLANT
SUT PROLENE 2 0 CT2 30 (SUTURE) ×2 IMPLANT
SUT SILK 2 0 TIES 10X30 (SUTURE) ×2 IMPLANT
SUT VIC AB 2-0 SH 27 (SUTURE) ×4
SUT VIC AB 2-0 SH 27X BRD (SUTURE) IMPLANT
SYR 20ML ECCENTRIC (SYRINGE) ×4 IMPLANT
SYR 5ML LUER SLIP (SYRINGE) ×2 IMPLANT
SYR BULB 3OZ (MISCELLANEOUS) ×2 IMPLANT
SYR CONTROL 10ML LL (SYRINGE) ×2 IMPLANT
SYRINGE 10CC LL (SYRINGE) ×2 IMPLANT
TOWEL OR 17X24 6PK STRL BLUE (TOWEL DISPOSABLE) ×2 IMPLANT
TOWEL OR 17X26 10 PK STRL BLUE (TOWEL DISPOSABLE) ×2 IMPLANT
TUBE CONNECTING 12X1/4 (SUCTIONS) IMPLANT
YANKAUER SUCT BULB TIP NO VENT (SUCTIONS) IMPLANT

## 2012-05-23 NOTE — Op Note (Signed)
Pre Operative Diagnosis:  Stg IV colon cancer  Post Operative Diagnosis: same  Procedure: PAC replacement  Surgeon: Dr. Axel Filler  Assistant: none  Anesthesia: GETA  EBL: <5cc  Complications: none  Counts: reported as correct x 2  Findings:  PAC catheter with tip at the atrial-caval junction.  Catheter at 19cm at the skin.  Indications for procedure:  Pt is a 57 y/o F with Stg IV colon cancer.  Pt is set to start with chemotx.  She had a previous placement of PAC that had the tip of the catheter at malpositioned.  Details of the procedure:The patient was taken back to the operating room. The patient was placed in supine position with bilateral SCDs in place. After appropriate anitbiotics were confirmed, a time-out was confirmed and all facts were verified.  The previous catheter was dissected down and removed from the port.  A wire was placed via the Seldinger technique, and advanced toward the atrial caval junction.  The wire was assessed under fluorscopy, but was unable to adv to the atrial caval junction.  After multp attempts the wire was removed as was the port.  The subcutaneous space was reapproximated with 2-0 vicryl stitches.  The skin was reapproximated with 4-0 monocryl.  The area was dressed with Dermabond.  I than began via a Seldinger technique on the patient's R side.  The SCV was cannulized.  Dark, slow blood was aspirated.  Placement was confirmed via fluroscopy.  A pocket was made for the port.  A introducer was advanced over a wire, and the catheter was placed into the introducer.  The placement was confirmed via fluoroscopy.  The catheter was cut at 19cm at the skin.  This was attached to the port.  The port was secured to the pocket with 3-0 prolene stitches x 3.  The pocket was than reapproximated with 3-0 vicryl in an interrupted fashion.  The port was accessed and aspirated easily and flushed easily.  A final image was shot with fluoroscopy and the catheter tip  was in place  The skin was than reapproximated with 4-0 monocryl.  The skin was dressed with Dermabond and the needle was left in place and secured with gauze and tegaderm.     The patient was taken to the recovery room in stable condition.

## 2012-05-23 NOTE — H&P (Signed)
[name] is an 57 y.o. female.   Chief Complaint: colon ca HPI: The pt is a 57 y/o F with Stg IV colon cancer s/p colon resection and liver resection.  The pt has been doing well and has had f/u with Dr. Truett Perna.  She is to begin chemotx as per Dr. Truett Perna.  She had a CXR which revealed some movement in the position of the catheter.    Past Medical History  Diagnosis Date  . Hypertension     pt states she stopped taking meds while in her 20's  . Cancer   . Blood in stool   . History of blood transfusion   . Anemia   . Eczema   . Heart murmur     does not see a cardiologist has been told she does has a murmur and also that she does not have a murmur  . Metastatic colon cancer to liver     Past Surgical History  Procedure Date  . Abdominal hysterectomy   . Breast lumpectomy     left breast  . Colonoscopy 02/18/2012    Procedure: COLONOSCOPY;  Surgeon: Theda Belfast, MD;  Location: [facility] ENDOSCOPY;  Service: Endoscopy;  Laterality: N/A;  . Portacath placement 03/14/2012    Procedure: INSERTION PORT-A-CATH;  Surgeon: Axel Filler, MD;  Location: [facility] OR;  Service: General;  Laterality: N/A;  . Laparoscopic sigmoid colectomy 04/11/2012    Procedure: LAPAROSCOPIC SIGMOID COLECTOMY;  Surgeon: Almond Lint, MD;  Location: [facility] OR;  Service: General;;  converted to Open with splenic flexure take down  . Open partial hepatectomy [83] 04/11/2012    Procedure: OPEN PARTIAL HEPATECTOMY [83];  Surgeon: Almond Lint, MD;  Location[facility] OR;  Service: General;  Laterality: Right;  . Laparoscopy 04/11/2012    Procedure: LAPAROSCOPY DIAGNOSTIC;  Surgeon: Almond Lint, MD;  Location: [facility] OR;  Service: General;  Laterality: N/A;  . Cholecystectomy 04/11/2012    Procedure: CHOLECYSTECTOMY;  Surgeon: Almond Lint, MD;  Location: [facility] OR;  Service: General;;  . Ileocecetomy 04/11/2012    Procedure: Ferne Coe;  Surgeon: Almond Lint, MD;  Location: [facility] OR;  Service: General;  Laterality: N/A;     Family History  Problem Relation Age of Onset  . Hypertension Mother   . Heart disease Father    Social History:  reports that she has never smoked. She has never used smokeless tobacco. She reports that she does not drink alcohol or use illicit drugs.  Allergies: No Known Allergies  Medications Prior to Admission  Medication Sig Dispense Refill  . amLODipine (NORVASC) 10 MG tablet Take 10 mg by mouth daily.      . ferrous sulfate 325 (65 FE) MG tablet Take 325 mg by mouth daily.       Marland Kitchen HYDROcodone-acetaminophen (NORCO/VICODIN) 5-325 MG per tablet Take 1-2 tablets by mouth every 6 (six) hours as needed. For pain      . capecitabine (XELODA) 500 MG tablet Take 4 tablets (2,000 mg total) by mouth 2 (two) times daily after a meal.  112 tablet  0  . lidocaine-prilocaine (EMLA) cream Apply topically as needed. Apply to [facility] site 1-2 hours prior to stick and cover with plastic wrap to numb site  30 g  prn  . prochlorperazine (COMPAZINE) 10 MG tablet Take 1 tablet (10 mg total) by mouth every 6 (six) hours as needed.  30 tablet  0    No results found for this or any previous visit (from the past 48 hour(s)).  No results found.  Review of Systems  Constitutional: Negative.   HENT: Negative.   Eyes: Negative.   Respiratory: Negative.   Cardiovascular: Negative.   Gastrointestinal: Negative.   Musculoskeletal: Negative.   Skin: Negative.   Neurological: Negative.     Blood pressure 131/78, pulse 93, temperature 98.7 F (37.1 C), temperature source Oral, resp. rate 20, SpO2 100.00%. Physical Exam  Constitutional: She is oriented to person, place, and time. She appears well-developed and well-nourished.  HENT:  Head: Normocephalic and atraumatic.  Eyes: Conjunctivae normal are normal. Pupils are equal, round, and reactive to light.  Neck: Normal range of motion. Neck supple.  Cardiovascular: Normal rate, regular rhythm and normal heart sounds.   Respiratory: Effort normal and  breath sounds normal.    GI: Soft. Bowel sounds are normal.  Musculoskeletal: Normal range of motion.  Neurological: She is alert and oriented to person, place, and time.     Assessment/Plan 57 y/o F with Stg IV colon ca.    We will reposition her PAC but hopefully be able to keep it on the L side.   All risks and benefits were discussed with the patient, to generally include infection, bleeding, damage to surrounding structures, and recurrence. Alternatives were offered and described.  All questions were answered and the patient voiced understanding of the procedure and wishes to proceed at this point.   Marigene Ehlers., Benaiah Behan 05/23/2012, 6:57 AM

## 2012-05-23 NOTE — Anesthesia Preprocedure Evaluation (Addendum)
Anesthesia Evaluation  Patient identified by MRN, date of birth, ID band Patient awake    Airway Mallampati: II TM Distance: >3 FB Neck ROM: Full    Dental  (+) Teeth Intact, Dental Advisory Given and Missing   Pulmonary  breath sounds clear to auscultation        Cardiovascular hypertension, Pt. on medications + Valvular Problems/Murmurs Rhythm:Regular Rate:Normal     Neuro/Psych    GI/Hepatic   Endo/Other    Renal/GU      Musculoskeletal   Abdominal   Peds  Hematology   Anesthesia Other Findings   Reproductive/Obstetrics                          Anesthesia Physical Anesthesia Plan  ASA: III  Anesthesia Plan: General   Post-op Pain Management:    Induction: Intravenous  Airway Management Planned: LMA  Additional Equipment:   Intra-op Plan:   Post-operative Plan: Extubation in OR  Informed Consent: I have reviewed the patients History and Physical, chart, labs and discussed the procedure including the risks, benefits and alternatives for the proposed anesthesia with the patient or authorized representative who has indicated his/her understanding and acceptance.   Dental advisory given  Plan Discussed with: CRNA, Anesthesiologist and Surgeon  Anesthesia Plan Comments:         Anesthesia Quick Evaluation

## 2012-05-23 NOTE — Anesthesia Postprocedure Evaluation (Signed)
  Anesthesia Post-op Note  Patient: [name]  Procedure(s) Performed: Procedure(s) (LRB) with comments: PORT A CATH REVISION (Right) - Port-A-Cath repositioning versus replacement  Patient Location: PACU  Anesthesia Type:General  Level of Consciousness: awake, alert  and oriented  Airway and Oxygen Therapy: Patient Spontanous Breathing and Patient connected to nasal cannula oxygen  Post-op Pain: mild  Post-op Assessment: Post-op Vital signs reviewed  Post-op Vital Signs: Reviewed  Complications: No apparent anesthesia complications

## 2012-05-23 NOTE — Transfer of Care (Signed)
Immediate Anesthesia Transfer of Care Note  Patient: [name]  Procedure(s) Performed: Procedure(s) (LRB) with comments: PORT A CATH REVISION (Right) - Port-A-Cath repositioning versus replacement  Patient Location: PACU  Anesthesia Type:General  Level of Consciousness: awake, alert  and oriented  Airway & Oxygen Therapy: Patient Spontanous Breathing and Patient connected to nasal cannula oxygen  Post-op Assessment: Report given to PACU RN, Post -op Vital signs reviewed and stable and Patient moving all extremities X 4  Post vital signs: Reviewed and stable  Complications: No apparent anesthesia complications

## 2012-05-24 ENCOUNTER — Encounter (INDEPENDENT_AMBULATORY_CARE_PROVIDER_SITE_OTHER): Payer: Self-pay | Admitting: General Surgery

## 2012-05-24 ENCOUNTER — Ambulatory Visit (INDEPENDENT_AMBULATORY_CARE_PROVIDER_SITE_OTHER): Payer: Medicaid Other | Admitting: General Surgery

## 2012-05-24 VITALS — BP 164/88 | HR 76 | Temp 98.7°F | Resp 16 | Ht 70.0 in | Wt 223.0 lb

## 2012-05-24 DIAGNOSIS — C189 Malignant neoplasm of colon, unspecified: Secondary | ICD-10-CM

## 2012-05-24 DIAGNOSIS — C787 Secondary malignant neoplasm of liver and intrahepatic bile duct: Secondary | ICD-10-CM

## 2012-05-24 NOTE — Patient Instructions (Signed)
Get chemo as instructed.  No heavy lifting for another 6 weeks  Follow up with me in 4 months.

## 2012-05-24 NOTE — Assessment & Plan Note (Signed)
No evidence of surgical complications.  Patient is starting chemotherapy tomorrow.  I will see her back in around 4 months. She has an appointment with Dr. Derrell Lolling in one month.

## 2012-05-24 NOTE — Progress Notes (Signed)
HISTORY: Patient is now approximately 6 weeks status post resection of liver metastasis and en bloc sigmoid colectomy with right hemicolectomy and terminal ileum for stage IV colon cancer. She has been doing very well after surgery. She went home within a week without complications. She is planning to start chemotherapy tomorrow. She is not taking any narcotics. She is having normal bowel movements. She denies fevers and chills. She denies jaundice. Her appetite and energy are continuing to improve.    EXAM: General:  Alert and oriented Incision:  Well healed.    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  Patient is starting chemotherapy tomorrow.  I will see her back in around 4 months. She has an appointment with Dr. Derrell Lolling in one month.      Maudry Diego, MD Surgical Oncology, General & Endocrine Surgery [facility]  Darden Palmer, MD Darden Palmer, MD

## 2012-05-25 ENCOUNTER — Telehealth: Payer: Self-pay | Admitting: Oncology

## 2012-05-25 ENCOUNTER — Ambulatory Visit (HOSPITAL_BASED_OUTPATIENT_CLINIC_OR_DEPARTMENT_OTHER): Payer: Medicaid Other

## 2012-05-25 ENCOUNTER — Telehealth: Payer: Self-pay | Admitting: *Deleted

## 2012-05-25 ENCOUNTER — Ambulatory Visit: Payer: Medicaid Other | Admitting: Nutrition

## 2012-05-25 ENCOUNTER — Ambulatory Visit (HOSPITAL_BASED_OUTPATIENT_CLINIC_OR_DEPARTMENT_OTHER): Payer: Medicaid Other | Admitting: Oncology

## 2012-05-25 VITALS — BP 148/91 | HR 93 | Temp 97.1°F | Resp 20 | Ht 70.0 in | Wt 223.3 lb

## 2012-05-25 DIAGNOSIS — C187 Malignant neoplasm of sigmoid colon: Secondary | ICD-10-CM

## 2012-05-25 DIAGNOSIS — C787 Secondary malignant neoplasm of liver and intrahepatic bile duct: Secondary | ICD-10-CM

## 2012-05-25 DIAGNOSIS — C189 Malignant neoplasm of colon, unspecified: Secondary | ICD-10-CM

## 2012-05-25 DIAGNOSIS — D509 Iron deficiency anemia, unspecified: Secondary | ICD-10-CM

## 2012-05-25 DIAGNOSIS — Z5111 Encounter for antineoplastic chemotherapy: Secondary | ICD-10-CM

## 2012-05-25 MED ORDER — ONDANSETRON 8 MG/50ML IVPB (CHCC)
8.0000 mg | Freq: Once | INTRAVENOUS | Status: AC
  Administered 2012-05-25: 8 mg via INTRAVENOUS

## 2012-05-25 MED ORDER — DEXTROSE 5 % IV SOLN
Freq: Once | INTRAVENOUS | Status: AC
  Administered 2012-05-25: 11:00:00 via INTRAVENOUS

## 2012-05-25 MED ORDER — OXALIPLATIN CHEMO INJECTION 100 MG/20ML
100.0000 mg/m2 | Freq: Once | INTRAVENOUS | Status: AC
  Administered 2012-05-25: 225 mg via INTRAVENOUS
  Filled 2012-05-25: qty 45

## 2012-05-25 MED ORDER — LIDOCAINE-PRILOCAINE 2.5-2.5 % EX CREA: TOPICAL_CREAM | CUTANEOUS | Status: DC | PRN

## 2012-05-25 MED ORDER — DEXAMETHASONE SODIUM PHOSPHATE 10 MG/ML IJ SOLN
10.0000 mg | Freq: Once | INTRAMUSCULAR | Status: AC
  Administered 2012-05-25: 10 mg via INTRAVENOUS

## 2012-05-25 MED ORDER — SODIUM CHLORIDE 0.9 % IJ SOLN
10.0000 mL | INTRAMUSCULAR | Status: DC | PRN
Start: 2012-05-25 — End: 2012-05-25
  Administered 2012-05-25: 10 mL
  Filled 2012-05-25: qty 10

## 2012-05-25 MED ORDER — HEPARIN SOD (PORK) LOCK FLUSH 100 UNIT/ML IV SOLN
500.0000 [IU] | Freq: Once | INTRAVENOUS | Status: AC | PRN
  Administered 2012-05-25: 500 [IU]
  Filled 2012-05-25: qty 5

## 2012-05-25 NOTE — Progress Notes (Signed)
Noted patient had PAC right chest accessed with no dressing over it. Reports she removed the dressing this morning because it was itching. Aspirated 20 cc blood easily from cath, then flushed with 10cc saline and 5 cc Heparin. Deaccessed port. Applied pressure to area (no bleeding). Then applied EMLA cream and tegaderm dressing to cover. Educated patient on [facility] site being a sterile field when it is accessed and infection risk when this is compromised. Instructed her to call office asap for any fever, swelling, redness, heat or increase in pain at port site. She verbalizes understanding.

## 2012-05-25 NOTE — Progress Notes (Signed)
Patient is a 57 year old female diagnosed with metastatic colon cancer status post right hemicolectomy. She is a patient of Dr. Truett Perna.  Past medical history includes iron deficiency anemia, obesity, hypertension, and hypokalemia.  Medications include Xeloda, ferrous sulfate, and Compazine.  Labs include potassium 3.4 on January 20.  Height: 5 feet 10 inches. Weight: 223.3 pounds. Usual body weight 247 pounds September 2013. BMI: 32.04.  Patient is receiving chemotherapy today. I stopped by to meet her at GI navigator's request. Patient denies nutritional problems at this time. She states she has a good appetite and is eating well. She denies nutritional side effects. Patient has lost 10% of her usual body weight in the past 4 months.  Nutrition diagnosis: Food and nutrition related knowledge deficit related to new diagnosis of metastatic colon cancer and associated treatments as evidenced by no prior need for nutrition related information.  Intervention: I educated patient on the importance of small, frequent meals with higher calorie, higher protein foods to promote weight maintenance. Patient was encouraged to consider oral nutrition supplements between meals if oral intake isn't adequate. I provided coupons for her to take with her today. Teach back method used.   Monitoring, evaluation, goals: Patient will tolerate oral intake to minimize weight loss throughout treatment.  Next visit: Wednesday, February 19, in chemotherapy.

## 2012-05-25 NOTE — Telephone Encounter (Signed)
appts made and printed for pt aom °

## 2012-05-25 NOTE — Telephone Encounter (Signed)
Per staff phone call and POF I have schedueld appts.  JMW  

## 2012-05-25 NOTE — Progress Notes (Signed)
Met with patient to review plan of care and answer any questions.  She asked if she was supposed to take Xeloda only M-F and appeared to be confused about when and how to take this drug.  She was alone, without family or friends, and is dependant on transportation set up by SW.  This RN confirmed the rx and actual pill bottle with pharmacist, Renella Cunas.  This RN wrote out a calendar for the patient and had pharmacist review prior to giving it to patient.  Pill boxes were provided to patient.  Patient filled the pill boxes while being observed by this RN and confirmed how many pills to take per day, how often,  and for how many days.  Teach back method was performed.  Patient was appreciative of the information and of the pill boxes.  Safe handling of drug was reinforced with the patient (she was given several pairs of gloves) and she was reminded to keep this medication out of reach of children.  This patient's name and contact information was provided to pharmacist overseeing Xeloda program.  Patient verbalized understanding and confirmed she was call for any questions or concerns.  She has contact phone numbers for [facility] and this RN.

## 2012-05-25 NOTE — Progress Notes (Signed)
   [facility]    OFFICE PROGRESS NOTE   INTERVAL HISTORY:   She returns as scheduled. She underwent placement of a right Port-A-Cath on 05/23/2012. She reports tolerating the procedure well.  [name] has attended a chemotherapy teaching class. She is scheduled to begin Xeloda today.  Objective:  Vital signs in last 24 hours:  Blood pressure 148/91, pulse 93, temperature 97.1 F (36.2 C), temperature source Oral, resp. rate 20, height 5\' 10"  (1.778 m), weight 223 lb 4.8 oz (101.288 kg).   Resp: Lungs clear bilaterally Cardio: Regular rate and rhythm GI: No hepatomegaly, healed midline incision, nontender Vascular: No leg edema  Portacath/PICC-without erythema  Lab Results:  Lab Results  Component Value Date   WBC 7.7 05/16/2012   HGB 10.1* 05/16/2012   HCT 31.6* 05/16/2012   MCV 77.2* 05/16/2012   PLT 246 05/16/2012   ANC 5.3 CEA 10.3 on 05/16/2012  Medications: I have reviewed the patient's current medications.  Assessment/Plan: 1. Metastatic colon cancer (T4, N0, M1) adenocarcinoma of the sigmoid colon, status post a sigmoid colectomy and partial right hepatectomy 04/11/2012  2. Microcytic anemia , iron deficiency, now maintained on iron-improved 3. Markedly elevated preoperative CEA , mildly elevated on 05/16/2012 4. Status post Port-A-Cath placement 03/14/2012-and x-ray today confirms the Port-A-Cath tip to be in the azygos vein, status post removal of the left-sided Port-A-Cath and placement of a new right Port-A-Cath on 05/23/2012.    Disposition:  She appears stable. The plan is to begin CAPOX chemotherapy today. She will return for an office visit and chemotherapy in 3 weeks. We discussed the toxicities associated with oxaliplatin and Xeloda.  The CEA was persistently elevated on 05/16/2012. This could reflect a slow fall in the markedly elevated preoperative CEA. The CEA will be repeated when she returns in 3 weeks.   Thornton Papas,  MD  05/25/2012  12:00 PM

## 2012-05-25 NOTE — Patient Instructions (Addendum)
[facility] Discharge Instructions for Patients Receiving Chemotherapy  Today you received the following chemotherapy agents Oxaliplatin To help prevent nausea and vomiting after your treatment, we encourage you to take your nausea medication as prescribed.  If you develop nausea and vomiting that is not controlled by your nausea medication, call the clinic. If it is after clinic hours your family physician or the after hours number for the clinic or go to the Emergency Department.   BELOW ARE SYMPTOMS THAT SHOULD BE REPORTED IMMEDIATELY:  *FEVER GREATER THAN 100.5 F  *CHILLS WITH OR WITHOUT FEVER  NAUSEA AND VOMITING THAT IS NOT CONTROLLED WITH YOUR NAUSEA MEDICATION  *UNUSUAL SHORTNESS OF BREATH  *UNUSUAL BRUISING OR BLEEDING  TENDERNESS IN MOUTH AND THROAT WITH OR WITHOUT PRESENCE OF ULCERS  *URINARY PROBLEMS  *BOWEL PROBLEMS  UNUSUAL RASH Items with * indicate a potential emergency and should be followed up as soon as possible.  One of the nurses will contact you 24 hours after your treatment. Please let the nurse know about any problems that you may have experienced. Feel free to call the clinic you have any questions or concerns. The clinic phone number is [phone].   I have been informed and understand all the instructions given to me. I know to contact the clinic, my physician, or go to the Emergency Department if any problems should occur. I do not have any questions at this time, but understand that I may call the clinic during office hours   should I have any questions or need assistance in obtaining follow up care.    __________________________________________  _____________  __________ Signature of Patient or Authorized Representative            Date                   Time    __________________________________________ Nurse's Signature    Oxaliplatin Injection What is this medicine? OXALIPLATIN (ox AL i PLA tin) is a chemotherapy drug.  It targets fast dividing cells, like cancer cells, and causes these cells to die. This medicine is used to treat cancers of the colon and rectum, and many other cancers. This medicine may be used for other purposes; ask your health care provider or pharmacist if you have questions. What should I tell my health care provider before I take this medicine? They need to know if you have any of these conditions: -kidney disease -an unusual or allergic reaction to oxaliplatin, other chemotherapy, other medicines, foods, dyes, or preservatives -pregnant or trying to get pregnant -breast-feeding How should I use this medicine? This drug is given as an infusion into a vein. It is administered in a hospital or clinic by a specially trained health care professional. Talk to your pediatrician regarding the use of this medicine in children. Special care may be needed. Overdosage: If you think you have taken too much of this medicine contact a poison control center or emergency room at once. NOTE: This medicine is only for you. Do not share this medicine with others. What if I miss a dose? It is important not to miss a dose. Call your doctor or health care professional if you are unable to keep an appointment. What may interact with this medicine? -medicines to increase blood counts like filgrastim, pegfilgrastim, sargramostim -probenecid -some antibiotics like amikacin, gentamicin, neomycin, polymyxin B, streptomycin, tobramycin -zalcitabine Talk to your doctor or health care professional before taking any of these medicines: -acetaminophen -aspirin -ibuprofen -ketoprofen -naproxen  This list may not describe all possible interactions. Give your health care provider a list of all the medicines, herbs, non-prescription drugs, or dietary supplements you use. Also tell them if you smoke, drink alcohol, or use illegal drugs. Some items may interact with your medicine. What should I watch for while using this  medicine? Your condition will be monitored carefully while you are receiving this medicine. You will need important blood work done while you are taking this medicine. This medicine can make you more sensitive to cold. Do not drink cold drinks or use ice. Cover exposed skin before coming in contact with cold temperatures or cold objects. When out in cold weather wear warm clothing and cover your mouth and nose to warm the air that goes into your lungs. Tell your doctor if you get sensitive to the cold. This drug may make you feel generally unwell. This is not uncommon, as chemotherapy can affect healthy cells as well as cancer cells. Report any side effects. Continue your course of treatment even though you feel ill unless your doctor tells you to stop. In some cases, you may be given additional medicines to help with side effects. Follow all directions for their use. Call your doctor or health care professional for advice if you get a fever, chills or sore throat, or other symptoms of a cold or flu. Do not treat yourself. This drug decreases your body's ability to fight infections. Try to avoid being around people who are sick. This medicine may increase your risk to bruise or bleed. Call your doctor or health care professional if you notice any unusual bleeding. Be careful brushing and flossing your teeth or using a toothpick because you may get an infection or bleed more easily. If you have any dental work done, tell your dentist you are receiving this medicine. Avoid taking products that contain aspirin, acetaminophen, ibuprofen, naproxen, or ketoprofen unless instructed by your doctor. These medicines may hide a fever. Do not become pregnant while taking this medicine. Women should inform their doctor if they wish to become pregnant or think they might be pregnant. There is a potential for serious side effects to an unborn child. Talk to your health care professional or pharmacist for more information.  Do not breast-feed an infant while taking this medicine. Call your doctor or health care professional if you get diarrhea. Do not treat yourself. What side effects may I notice from receiving this medicine? Side effects that you should report to your doctor or health care professional as soon as possible: -allergic reactions like skin rash, itching or hives, swelling of the face, lips, or tongue -low blood counts - This drug may decrease the number of white blood cells, red blood cells and platelets. You may be at increased risk for infections and bleeding. -signs of infection - fever or chills, cough, sore throat, pain or difficulty passing urine -signs of decreased platelets or bleeding - bruising, pinpoint red spots on the skin, black, tarry stools, nosebleeds -signs of decreased red blood cells - unusually weak or tired, fainting spells, lightheadedness -breathing problems -chest pain, pressure -cough -diarrhea -jaw tightness -mouth sores -nausea and vomiting -pain, swelling, redness or irritation at the injection site -pain, tingling, numbness in the hands or feet -problems with balance, talking, walking -redness, blistering, peeling or loosening of the skin, including inside the mouth -trouble passing urine or change in the amount of urine Side effects that usually do not require medical attention (report to your doctor or  health care professional if they continue or are bothersome): -changes in vision -constipation -hair loss -loss of appetite -metallic taste in the mouth or changes in taste -stomach pain This list may not describe all possible side effects. Call your doctor for medical advice about side effects. You may report side effects to FDA at 1-800-FDA-1088. Where should I keep my medicine? This drug is given in a hospital or clinic and will not be stored at home. NOTE: This sheet is a summary. It may not cover all possible information. If you have questions about this  medicine, talk to your doctor, pharmacist, or health care provider.  2013, [employer]. (11/08/2007 5:22:47 PM)

## 2012-05-26 ENCOUNTER — Telehealth: Payer: Self-pay | Admitting: *Deleted

## 2012-05-26 NOTE — Telephone Encounter (Signed)
Spoke with patient. Had 1st Oxaliplatin yesterday. Pt states she tolerated it well. Feels "good, no N/V and is drinking room temp fluids". Reinforced calling us as needed.

## 2012-05-27 ENCOUNTER — Telehealth: Payer: Self-pay | Admitting: *Deleted

## 2012-05-27 ENCOUNTER — Telehealth: Payer: Self-pay | Admitting: Internal Medicine

## 2012-05-27 NOTE — Telephone Encounter (Signed)
Spoke with patient by phone.  She denied complaints.  She stated she was not having any difficulty taking her Xeloda and denied any side effects at present time.  She confirmed phone number for [facility]  She knows to call for questions or problems.  Will continue to follow as needed.

## 2012-05-27 NOTE — Telephone Encounter (Signed)
I called [name] this afternoon just to check on her and see how she has been doing since surgery and as she starts to get ready for chemotherapy.  She answered the phone but only has limited minutes on phone.  She claims to be doing well overall but tired.  She said she would call back when she has more minutes on her phone and also can call the clinic to set up a future follow up appointment.

## 2012-06-01 ENCOUNTER — Telehealth: Payer: Self-pay | Admitting: Pharmacist

## 2012-06-01 NOTE — Telephone Encounter (Signed)
finally reached [name] today (2/5) after she added more minutes onto her phone, she is doing well and reports no missed doses on the xeloda and that the pill box/calendar helped her out tremendously. She reports some minor nausea that is transient and is controlled with prn compazine. Will follow closely for adherence and treatment management. Pt has one week left of xeloda and then 1 week off prior to next cycle with next appointment on Wed Feb 19th with Lonna Cobb and chemo infusion. Will f/u in 1 week  Christell Faith, PharmD

## 2012-06-12 ENCOUNTER — Other Ambulatory Visit: Payer: Self-pay | Admitting: Oncology

## 2012-06-15 ENCOUNTER — Ambulatory Visit (HOSPITAL_BASED_OUTPATIENT_CLINIC_OR_DEPARTMENT_OTHER): Payer: Medicaid Other | Admitting: Nurse Practitioner

## 2012-06-15 ENCOUNTER — Encounter: Payer: Self-pay | Admitting: *Deleted

## 2012-06-15 ENCOUNTER — Ambulatory Visit: Payer: Medicaid Other | Admitting: Nutrition

## 2012-06-15 ENCOUNTER — Ambulatory Visit (HOSPITAL_BASED_OUTPATIENT_CLINIC_OR_DEPARTMENT_OTHER): Payer: Medicaid Other

## 2012-06-15 ENCOUNTER — Telehealth: Payer: Self-pay | Admitting: Oncology

## 2012-06-15 ENCOUNTER — Other Ambulatory Visit: Payer: Medicaid Other | Admitting: Lab

## 2012-06-15 VITALS — BP 165/90 | HR 71 | Temp 97.7°F | Resp 20 | Ht 70.0 in | Wt 227.6 lb

## 2012-06-15 DIAGNOSIS — C187 Malignant neoplasm of sigmoid colon: Secondary | ICD-10-CM

## 2012-06-15 DIAGNOSIS — C189 Malignant neoplasm of colon, unspecified: Secondary | ICD-10-CM

## 2012-06-15 DIAGNOSIS — C787 Secondary malignant neoplasm of liver and intrahepatic bile duct: Secondary | ICD-10-CM

## 2012-06-15 DIAGNOSIS — D509 Iron deficiency anemia, unspecified: Secondary | ICD-10-CM

## 2012-06-15 DIAGNOSIS — Z5111 Encounter for antineoplastic chemotherapy: Secondary | ICD-10-CM

## 2012-06-15 DIAGNOSIS — R970 Elevated carcinoembryonic antigen [CEA]: Secondary | ICD-10-CM

## 2012-06-15 LAB — CBC WITH DIFFERENTIAL/PLATELET
BASO%: 1 % (ref 0.0–2.0)
EOS%: 2.9 % (ref 0.0–7.0)
Eosinophils Absolute: 0.2 10*3/uL (ref 0.0–0.5)
LYMPH%: 40.5 % (ref 14.0–49.7)
MCH: 24.7 pg — ABNORMAL LOW (ref 25.1–34.0)
MCHC: 31.1 g/dL — ABNORMAL LOW (ref 31.5–36.0)
MCV: 79.5 fL (ref 79.5–101.0)
MONO%: 7.1 % (ref 0.0–14.0)
NEUT#: 2.5 10*3/uL (ref 1.5–6.5)
Platelets: 219 10*3/uL (ref 145–400)
RBC: 4.05 10*6/uL (ref 3.70–5.45)
RDW: 20.5 % — ABNORMAL HIGH (ref 11.2–14.5)

## 2012-06-15 LAB — COMPREHENSIVE METABOLIC PANEL (CC13)
ALT: 29 U/L (ref 0–55)
AST: 24 U/L (ref 5–34)
Alkaline Phosphatase: 94 U/L (ref 40–150)
Creatinine: 0.8 mg/dL (ref 0.6–1.1)
Sodium: 139 mEq/L (ref 136–145)
Total Bilirubin: 0.41 mg/dL (ref 0.20–1.20)
Total Protein: 8.5 g/dL — ABNORMAL HIGH (ref 6.4–8.3)

## 2012-06-15 LAB — CEA: CEA: 3.4 ng/mL (ref 0.0–5.0)

## 2012-06-15 MED ORDER — ONDANSETRON 8 MG/50ML IVPB (CHCC)
8.0000 mg | Freq: Once | INTRAVENOUS | Status: AC
  Administered 2012-06-15: 8 mg via INTRAVENOUS

## 2012-06-15 MED ORDER — HEPARIN SOD (PORK) LOCK FLUSH 100 UNIT/ML IV SOLN
500.0000 [IU] | Freq: Once | INTRAVENOUS | Status: AC | PRN
  Administered 2012-06-15: 500 [IU]
  Filled 2012-06-15: qty 5

## 2012-06-15 MED ORDER — CAPECITABINE 500 MG PO TABS: 2000.0000 mg | ORAL_TABLET | Freq: Two times a day (BID) | ORAL | Status: DC

## 2012-06-15 MED ORDER — OXALIPLATIN CHEMO INJECTION 100 MG/20ML
100.0000 mg/m2 | Freq: Once | INTRAVENOUS | Status: AC
  Administered 2012-06-15: 225 mg via INTRAVENOUS
  Filled 2012-06-15: qty 45

## 2012-06-15 MED ORDER — DEXTROSE 5 % IV SOLN
Freq: Once | INTRAVENOUS | Status: AC
  Administered 2012-06-15: 12:00:00 via INTRAVENOUS

## 2012-06-15 MED ORDER — SODIUM CHLORIDE 0.9 % IJ SOLN
10.0000 mL | INTRAMUSCULAR | Status: DC | PRN
  Administered 2012-06-15: 10 mL
  Filled 2012-06-15: qty 10

## 2012-06-15 MED ORDER — DEXAMETHASONE SODIUM PHOSPHATE 10 MG/ML IJ SOLN
10.0000 mg | Freq: Once | INTRAMUSCULAR | Status: AC
  Administered 2012-06-15: 10 mg via INTRAVENOUS

## 2012-06-15 NOTE — Progress Notes (Signed)
OFFICE PROGRESS NOTE  Interval history:  [name] returns as scheduled. She completed cycle 1 CAPOX 05/25/2012. She denies nausea/vomiting. No mouth sores. She had a few loose stools after the treatment. No frank diarrhea. No hand or foot pain or redness. Cold sensitivity lasted approximately 5 days.   Objective: Blood pressure 165/90, pulse 71, temperature 97.7 F (36.5 C), temperature source Oral, resp. rate 20, height 5\' 10"  (1.778 m), weight 227 lb 9.6 oz (103.239 kg).  Oropharynx is without thrush or ulceration. Lungs are clear. Regular cardiac rhythm. Port-A-Cath site is without erythema. Abdomen is soft and nontender. No hepatomegaly. Healed midline incision. Extremities are without edema.  Lab Results: Lab Results  Component Value Date   WBC 5.2 06/15/2012   HGB 10.0* 06/15/2012   HCT 32.2* 06/15/2012   MCV 79.5 06/15/2012   PLT 219 06/15/2012    Chemistry:    Chemistry      Component Value Date/Time   NA 141 05/16/2012 1155   NA 138 04/15/2012 0525   K 3.4* 05/16/2012 1155   K 4.3 04/15/2012 0525   CL 104 05/16/2012 1155   CL 104 04/15/2012 0525   CO2 28 05/16/2012 1155   CO2 27 04/15/2012 0525   BUN 9.0 05/16/2012 1155   BUN 7 04/15/2012 0525   CREATININE 0.9 05/16/2012 1155   CREATININE 0.82 04/15/2012 0525   CREATININE 0.89 03/02/2012 1524      Component Value Date/Time   CALCIUM 10.3 05/16/2012 1155   CALCIUM 8.9 04/15/2012 0525   ALKPHOS 82 05/16/2012 1155   ALKPHOS 69 04/14/2012 0555   AST 16 05/16/2012 1155   AST 44* 04/14/2012 0555   ALT 11 05/16/2012 1155   ALT 72* 04/14/2012 0555   BILITOT 0.44 05/16/2012 1155   BILITOT 0.4 04/14/2012 0555       Studies/Results: Dg Chest 2 View  05/16/2012  *RADIOLOGY REPORT*  Clinical Data: Metastatic adenocarcinoma of the colon.  CHEST - 2 VIEW  Comparison: 03/14/2012 and 01/04/2012  Findings: Port-A-Cath tip is in the azygos vein and needs to be repositioned.  Heart size and pulmonary vascularity are normal and the lungs  are clear.  No effusion.  No acute osseous abnormality.  IMPRESSION: Port-A-Cath tip is in the azygos vein and needs to be repositioned.   Original Report Authenticated By: Francene Boyers, M.D.    Dg Chest Port 1 View  05/23/2012  *RADIOLOGY REPORT*  Clinical Data: Port-A-Cath placement  PORTABLE CHEST - 1 VIEW  Comparison: 05/16/2012  Findings: Left subclavian Port-A-Cath has been removed.  A new right subclavian Port-A-Cath has been placed with its tip at the cavoatrial junction, in the appropriate position.  No pneumothorax. Low volumes.  Cardiac silhouette prominent secondary to low volumes.  Clear lungs.  IMPRESSION: Right subclavian vein Port-A-Cath placed with its tip at the cavoatrial junction and no pneumothorax.  Left sided Port-A-Cath has been removed.   Original Report Authenticated By: Jolaine Click, M.D.    Dg Fluoro Guide Cv Line-no Report  05/23/2012  CLINICAL DATA: port a cath   FLOURO GUIDE CV LINE  Fluoroscopy was utilized by the requesting physician.  No radiographic  interpretation.      Medications: I have reviewed the patient's current medications.  Assessment/Plan:  1. Metastatic colon cancer (T4, N0, M1) adenocarcinoma of the sigmoid colon status post sigmoid colectomy and partial right hepatectomy 04/11/2012. She completed cycle 1 CAPOX beginning 05/25/2012. 2. Microcytic anemia, iron deficiency. She continues oral iron. 3. Markedly elevated preoperative CEA. The  CEA was mildly elevated on 05/16/2012. 4. Status post Port-A-Cath placement 03/14/2012. X-ray showed the Port-A-Cath tip to be in the azygos vein. The left-sided Port-A-Cath was removed and a new right Port-A-Cath was placed on 05/23/2012.  Disposition-[name] appears stable. Plan to proceed with cycle 2 CAPOX today as scheduled. We will followup on the CEA from today. She will return for a followup visit and cycle 3 in 3 weeks. She will contact the office in the interim with any problems.  Plan reviewed with Dr.  Truett Perna.  Lonna Cobb ANP/GNP-BC

## 2012-06-15 NOTE — Progress Notes (Signed)
I spoke briefly with patient today in chemotherapy. She reports her appetite is good. Her weight has increased to 227.6 pounds on February 19 from 223.3 pounds on January 29. Patient has no nutrition concerns at this time. She denies nutrition side effects.  Nutrition diagnosis food and nutrition related knowledge deficit resolved.  I've encouraged patient to contact me if she develops any nutrition side effects. I have encouraged weight maintenance. Patient has my contact information for questions or concerns.

## 2012-06-15 NOTE — Patient Instructions (Addendum)
[facility] Discharge Instructions for Patients Receiving Chemotherapy  Today you received the following chemotherapy agents Oxaliplatin.  To help prevent nausea and vomiting after your treatment, we encourage you to take your nausea medication as prescribed.   If you develop nausea and vomiting that is not controlled by your nausea medication, call the clinic. If it is after clinic hours your family physician or the after hours number for the clinic or go to the Emergency Department.   BELOW ARE SYMPTOMS THAT SHOULD BE REPORTED IMMEDIATELY:  *FEVER GREATER THAN 100.5 F  *CHILLS WITH OR WITHOUT FEVER  NAUSEA AND VOMITING THAT IS NOT CONTROLLED WITH YOUR NAUSEA MEDICATION  *UNUSUAL SHORTNESS OF BREATH  *UNUSUAL BRUISING OR BLEEDING  TENDERNESS IN MOUTH AND THROAT WITH OR WITHOUT PRESENCE OF ULCERS  *URINARY PROBLEMS  *BOWEL PROBLEMS  UNUSUAL RASH Items with * indicate a potential emergency and should be followed up as soon as possible.  Feel free to call the clinic you have any questions or concerns. The clinic phone number is [phone].   I have been informed and understand all the instructions given to me. I know to contact the clinic, my physician, or go to the Emergency Department if any problems should occur. I do not have any questions at this time, but understand that I may call the clinic during office hours   should I have any questions or need assistance in obtaining follow up care.    __________________________________________  _____________  __________ Signature of Patient or Authorized Representative            Date                   Time    __________________________________________ Nurse's Signature    

## 2012-06-16 ENCOUNTER — Telehealth: Payer: Self-pay | Admitting: *Deleted

## 2012-06-16 DIAGNOSIS — C189 Malignant neoplasm of colon, unspecified: Secondary | ICD-10-CM

## 2012-06-16 DIAGNOSIS — E611 Iron deficiency: Secondary | ICD-10-CM

## 2012-06-16 MED ORDER — FERROUS SULFATE 325 (65 FE) MG PO TABS: 325.0000 mg | ORAL_TABLET | Freq: Three times a day (TID) | ORAL | Status: DC

## 2012-06-16 MED ORDER — POLYETHYLENE GLYCOL 3350 17 GM/SCOOP PO POWD: 17.0000 g | Freq: Every day | ORAL | Status: AC | PRN

## 2012-06-16 NOTE — Telephone Encounter (Signed)
Asking if she should continue her iron daily or resume the tid dosing she had done in the past? Also asking if OK to take MiraLax daily if needed. Per Lonna Cobb, NP: Take iron tid-bid if she can tolerate and OK for MiraLax prn.

## 2012-06-30 ENCOUNTER — Telehealth (INDEPENDENT_AMBULATORY_CARE_PROVIDER_SITE_OTHER): Payer: Self-pay | Admitting: General Surgery

## 2012-06-30 NOTE — Telephone Encounter (Signed)
LMOM @12 :15 asking patient if she could come in sooner for tomorrows appt 3/7 to fill in some gaps in the schd

## 2012-06-30 NOTE — Telephone Encounter (Signed)
Pt called to confirm message for appt tomorrow with Dr. Derrell Lolling.

## 2012-06-30 NOTE — Telephone Encounter (Signed)
Pt states she woke two days ago with a knot on her neck, same side as her PAC.  Asking if it could be the PAC.  Reassured pt that the [facility] was inserted 6 weeks ago, so highly unlikely.  Suggested it might be a muscle and to try NSAIDs and heat to the site.  If she remains concerned for [facility], call the Cancer Center and inquire if they can attempt to access the port for blood return and flush.  She understands all.

## 2012-06-30 NOTE — Telephone Encounter (Signed)
error 

## 2012-07-01 ENCOUNTER — Telehealth: Payer: Self-pay | Admitting: *Deleted

## 2012-07-01 ENCOUNTER — Ambulatory Visit (HOSPITAL_BASED_OUTPATIENT_CLINIC_OR_DEPARTMENT_OTHER): Payer: Medicaid Other | Admitting: Nurse Practitioner

## 2012-07-01 ENCOUNTER — Ambulatory Visit (HOSPITAL_COMMUNITY)
Admission: RE | Admit: 2012-07-01 | Discharge: 2012-07-01 | Disposition: A | Discharge status: Home / Self Care | Payer: Medicaid Other | Source: Ambulatory Visit | Attending: Oncology | Admitting: Oncology

## 2012-07-01 ENCOUNTER — Telehealth: Payer: Self-pay | Admitting: Oncology

## 2012-07-01 ENCOUNTER — Encounter (INDEPENDENT_AMBULATORY_CARE_PROVIDER_SITE_OTHER): Payer: Medicaid Other | Admitting: General Surgery

## 2012-07-01 ENCOUNTER — Ambulatory Visit (HOSPITAL_BASED_OUTPATIENT_CLINIC_OR_DEPARTMENT_OTHER): Payer: Medicaid Other

## 2012-07-01 VITALS — BP 164/95 | HR 93 | Temp 97.6°F | Resp 20 | Ht 70.0 in | Wt 232.6 lb

## 2012-07-01 DIAGNOSIS — C787 Secondary malignant neoplasm of liver and intrahepatic bile duct: Secondary | ICD-10-CM

## 2012-07-01 DIAGNOSIS — D509 Iron deficiency anemia, unspecified: Secondary | ICD-10-CM

## 2012-07-01 DIAGNOSIS — I82401 Acute embolism and thrombosis of unspecified deep veins of right lower extremity: Secondary | ICD-10-CM

## 2012-07-01 DIAGNOSIS — C187 Malignant neoplasm of sigmoid colon: Secondary | ICD-10-CM

## 2012-07-01 DIAGNOSIS — I82B19 Acute embolism and thrombosis of unspecified subclavian vein: Secondary | ICD-10-CM | POA: Insufficient documentation

## 2012-07-01 DIAGNOSIS — C189 Malignant neoplasm of colon, unspecified: Secondary | ICD-10-CM

## 2012-07-01 DIAGNOSIS — R209 Unspecified disturbances of skin sensation: Secondary | ICD-10-CM

## 2012-07-01 DIAGNOSIS — M79609 Pain in unspecified limb: Secondary | ICD-10-CM | POA: Insufficient documentation

## 2012-07-01 DIAGNOSIS — M7989 Other specified soft tissue disorders: Secondary | ICD-10-CM | POA: Insufficient documentation

## 2012-07-01 LAB — CBC WITH DIFFERENTIAL/PLATELET
BASO%: 1.1 % (ref 0.0–2.0)
Basophils Absolute: 0.1 10*3/uL (ref 0.0–0.1)
HCT: 31.2 % — ABNORMAL LOW (ref 34.8–46.6)
LYMPH%: 31.2 % (ref 14.0–49.7)
MCHC: 33.4 g/dL (ref 31.5–36.0)
MONO#: 0.6 10*3/uL (ref 0.1–0.9)
NEUT%: 58.1 % (ref 38.4–76.8)
Platelets: 205 10*3/uL (ref 145–400)
WBC: 7.9 10*3/uL (ref 3.9–10.3)

## 2012-07-01 LAB — COMPREHENSIVE METABOLIC PANEL (CC13)
Albumin: 3.7 g/dL (ref 3.5–5.0)
BUN: 9.4 mg/dL (ref 7.0–26.0)
CO2: 26 mEq/L (ref 22–29)
Calcium: 10 mg/dL (ref 8.4–10.4)
Chloride: 105 mEq/L (ref 98–107)
Creatinine: 0.8 mg/dL (ref 0.6–1.1)
Glucose: 107 mg/dl — ABNORMAL HIGH (ref 70–99)
Potassium: 3.6 mEq/L (ref 3.5–5.1)

## 2012-07-01 MED ORDER — RIVAROXABAN 20 MG PO TABS: 20.0000 mg | ORAL_TABLET | Freq: Every day | ORAL | Status: DC

## 2012-07-01 MED ORDER — RIVAROXABAN 15 MG PO TABS: ORAL_TABLET | ORAL | Status: DC

## 2012-07-01 NOTE — Progress Notes (Signed)
OFFICE PROGRESS NOTE  Interval history:  Ms. Kallio returns prior to scheduled followup. She completed cycle 2 CAPOX beginning 06/15/2012. She denies nausea/vomiting. No mouth sores. No diarrhea. No neuropathy symptoms.  She reports a three-day history of "tenderness" at the right neck. She denies any redness or swelling. No fever or chills. She denies shortness of breath or chest pain.   Objective: Blood pressure 164/95, pulse 93, temperature 97.6 F (36.4 C), temperature source Oral, resp. rate 20, height 5\' 10"  (1.778 m), weight 232 lb 9.6 oz (105.507 kg).  Oropharynx is without thrush or ulceration. Lungs are clear. Regular cardiac rhythm. Port-A-Cath site at the right chest is without erythema. Venous engorgement over the right chest wall and right upper arm. Palpable fullness with associated tenderness at the right low anterior neck. Abdomen is soft and nontender. Extremities are without edema. Specifically, the right arm is not edematous.  Lab Results: Lab Results  Component Value Date   WBC 5.2 06/15/2012   HGB 10.0* 06/15/2012   HCT 32.2* 06/15/2012   MCV 79.5 06/15/2012   PLT 219 06/15/2012    Chemistry:    Chemistry      Component Value Date/Time   NA 139 06/15/2012 0834   NA 138 04/15/2012 0525   K 3.9 06/15/2012 0834   K 4.3 04/15/2012 0525   CL 104 06/15/2012 0834   CL 104 04/15/2012 0525   CO2 26 06/15/2012 0834   CO2 27 04/15/2012 0525   BUN 7.8 06/15/2012 0834   BUN 7 04/15/2012 0525   CREATININE 0.8 06/15/2012 0834   CREATININE 0.82 04/15/2012 0525   CREATININE 0.89 03/02/2012 1524      Component Value Date/Time   CALCIUM 10.2 06/15/2012 0834   CALCIUM 8.9 04/15/2012 0525   ALKPHOS 94 06/15/2012 0834   ALKPHOS 69 04/14/2012 0555   AST 24 06/15/2012 0834   AST 44* 04/14/2012 0555   ALT 29 06/15/2012 0834   ALT 72* 04/14/2012 0555   BILITOT 0.41 06/15/2012 0834   BILITOT 0.4 04/14/2012 0555       Studies/Results: No results found.  Medications: I have reviewed  the patient's current medications.  Assessment/Plan:  1. Metastatic colon cancer (T4, N0, M1) adenocarcinoma of the sigmoid colon status post sigmoid colectomy and partial right hepatectomy 04/11/2012. She completed cycle 1 CAPOX beginning 05/25/2012. She completed cycle 2 beginning 06/15/2012. 2. Microcytic anemia, iron deficiency. She continues oral iron. 3. Markedly elevated preoperative CEA. The CEA was mildly elevated on 05/16/2012. The CEA was in normal range at 3.4 on 06/15/2012. 4. Status post Port-A-Cath placement 03/14/2012. X-ray showed the Port-A-Cath tip to be in the azygos vein. The left-sided Port-A-Cath was removed and a new right Port-A-Cath was placed on 05/23/2012. 5. Tenderness/fullness at the right low anterior neck region.  Disposition-she likely has a catheter associated right IJ DVT. We are referring her for a venous Doppler study to confirm. If a DVT is confirmed she will begin Xarelto 15 mg twice daily for 3 weeks then 20 mg daily. We are obtaining a CBC today to ensure the platelet count is adequate to begin anticoagulation.   She is scheduled to return to the office on 07/06/2012 for cycle 3 CAPOX. She will return for a followup visit and cycle 4 CAPOX on 07/27/2012.  She will contact the office in the interim with any problems.  Patient seen with Dr. Truett Perna.  Lonna Cobb ANP/GNP-BC

## 2012-07-01 NOTE — Telephone Encounter (Signed)
Patient phoned stating that her right side neck is tender to touch.  "I can feel the neck vein and it is very tender when I touch it.  My neck has felt stiff for the last 3 days".  Patient denies redness and states she cannot tell if it her neck is swollen.  She denies fever or other complaints.  Dr. Truett Perna informed and asked that the patient come to [facility] now,  so she can be examined.  Patient stated she will try to get here by cab.  This RN informed patient, per Dr. Truett Perna, that she will need to have it checked in the emergency room if she cannot get here.  Patient stated she would call this RN back if she cannot get a cab to bring her in.

## 2012-07-01 NOTE — Telephone Encounter (Signed)
Gave pt appt for lab and MD  for March and April 2014, emailed [name] regarding chemo for 07/27/12

## 2012-07-01 NOTE — Telephone Encounter (Signed)
[name] was unable to obtain transportation to the office for evaluation today. She was instructed to go to the emergency Department with worsening of current symptoms or onset of new symptoms such as fever, redness or swelling. She expressed understanding.

## 2012-07-01 NOTE — Progress Notes (Addendum)
*  Preliminary Results* Right upper extremity venous duplex completed. Right upper extremity is positive for deep vein thrombosis involving the right subclavian and axillary veins. There is also evidence of a thrombosed superficial vein branching off of the proximal right subclavian vein and coursing superiorly up the right side of the neck. Incidental finding: There is evidence of a 1.7cm cyst of the right thyroid lobe.  Preliminary results discussed with Lonna Cobb, NP.  07/01/2012 3:44 PM Gertie Fey, RDMS, RDCS

## 2012-07-05 ENCOUNTER — Telehealth: Payer: Self-pay | Admitting: *Deleted

## 2012-07-05 NOTE — Telephone Encounter (Signed)
Called pt, informed her that she will have lab and chemo 3/12. Will not need to see MD. She voiced understanding, will keep same lab appt because her transportation will not adjust pick up time.

## 2012-07-06 ENCOUNTER — Other Ambulatory Visit: Payer: Self-pay | Admitting: *Deleted

## 2012-07-06 ENCOUNTER — Ambulatory Visit: Payer: Medicaid Other

## 2012-07-06 ENCOUNTER — Other Ambulatory Visit: Payer: Self-pay | Admitting: Oncology

## 2012-07-06 ENCOUNTER — Ambulatory Visit (HOSPITAL_BASED_OUTPATIENT_CLINIC_OR_DEPARTMENT_OTHER): Payer: Medicaid Other

## 2012-07-06 ENCOUNTER — Other Ambulatory Visit (HOSPITAL_BASED_OUTPATIENT_CLINIC_OR_DEPARTMENT_OTHER): Payer: Medicaid Other | Admitting: Lab

## 2012-07-06 DIAGNOSIS — C787 Secondary malignant neoplasm of liver and intrahepatic bile duct: Secondary | ICD-10-CM

## 2012-07-06 DIAGNOSIS — C187 Malignant neoplasm of sigmoid colon: Secondary | ICD-10-CM

## 2012-07-06 DIAGNOSIS — C189 Malignant neoplasm of colon, unspecified: Secondary | ICD-10-CM

## 2012-07-06 DIAGNOSIS — Z5111 Encounter for antineoplastic chemotherapy: Secondary | ICD-10-CM

## 2012-07-06 LAB — CBC WITH DIFFERENTIAL/PLATELET
Basophils Absolute: 0 10*3/uL (ref 0.0–0.1)
EOS%: 1.8 % (ref 0.0–7.0)
Eosinophils Absolute: 0.1 10*3/uL (ref 0.0–0.5)
HGB: 10.8 g/dL — ABNORMAL LOW (ref 11.6–15.9)
MCH: 26.2 pg (ref 25.1–34.0)
MONO#: 0.6 10*3/uL (ref 0.1–0.9)
NEUT#: 3.7 10*3/uL (ref 1.5–6.5)
RDW: 24.8 % — ABNORMAL HIGH (ref 11.2–14.5)
WBC: 6.6 10*3/uL (ref 3.9–10.3)
lymph#: 2.1 10*3/uL (ref 0.9–3.3)

## 2012-07-06 MED ORDER — ONDANSETRON 8 MG/50ML IVPB (CHCC)
8.0000 mg | Freq: Once | INTRAVENOUS | Status: AC
Start: 2012-07-06 — End: 2012-07-06
  Administered 2012-07-06: 8 mg via INTRAVENOUS

## 2012-07-06 MED ORDER — CAPECITABINE 500 MG PO TABS: 2000.0000 mg | ORAL_TABLET | Freq: Two times a day (BID) | ORAL | Status: DC

## 2012-07-06 MED ORDER — OXALIPLATIN CHEMO INJECTION 100 MG/20ML
100.0000 mg/m2 | Freq: Once | INTRAVENOUS | Status: AC
  Administered 2012-07-06: 225 mg via INTRAVENOUS
  Filled 2012-07-06: qty 45

## 2012-07-06 MED ORDER — DEXAMETHASONE SODIUM PHOSPHATE 10 MG/ML IJ SOLN
10.0000 mg | Freq: Once | INTRAMUSCULAR | Status: AC
  Administered 2012-07-06: 10 mg via INTRAVENOUS

## 2012-07-06 NOTE — Patient Instructions (Addendum)
[facility] Discharge Instructions for Patients Receiving Chemotherapy  Today you received the following chemotherapy agents Oxaliplatin.  To help prevent nausea and vomiting after your treatment, we encourage you to take your nausea medication.   If you develop nausea and vomiting that is not controlled by your nausea medication, call the clinic.   BELOW ARE SYMPTOMS THAT SHOULD BE REPORTED IMMEDIATELY:  *FEVER GREATER THAN 100.5 F  *CHILLS WITH OR WITHOUT FEVER  NAUSEA AND VOMITING THAT IS NOT CONTROLLED WITH YOUR NAUSEA MEDICATION  *UNUSUAL SHORTNESS OF BREATH  *UNUSUAL BRUISING OR BLEEDING  TENDERNESS IN MOUTH AND THROAT WITH OR WITHOUT PRESENCE OF ULCERS  *URINARY PROBLEMS  *BOWEL PROBLEMS  UNUSUAL RASH Items with * indicate a potential emergency and should be followed up as soon as possible.  Feel free to call the clinic you have any questions or concerns. The clinic phone number is [phone].    

## 2012-07-08 ENCOUNTER — Other Ambulatory Visit: Payer: Self-pay | Admitting: Certified Registered Nurse Anesthetist

## 2012-07-11 ENCOUNTER — Telehealth (INDEPENDENT_AMBULATORY_CARE_PROVIDER_SITE_OTHER): Payer: Self-pay | Admitting: General Surgery

## 2012-07-11 NOTE — Telephone Encounter (Signed)
LMOM at 4:33 asking patient if she could come in sooner for tomorrows appt 3/18 to fill in some gaps in the schedule.Marland Kitchen

## 2012-07-12 ENCOUNTER — Encounter (INDEPENDENT_AMBULATORY_CARE_PROVIDER_SITE_OTHER): Payer: Medicaid Other | Admitting: General Surgery

## 2012-07-13 ENCOUNTER — Telehealth: Payer: Self-pay | Admitting: *Deleted

## 2012-07-13 DIAGNOSIS — C189 Malignant neoplasm of colon, unspecified: Secondary | ICD-10-CM

## 2012-07-13 NOTE — Telephone Encounter (Signed)
Reports being one day behind on her Xeloda and asking what to do? Told not to "double up" to catch up. Just take that day at end of the cycle. Started her Xeloda on 3/13 instead of 3/12. Also instructed her to come to next appointment at 0930 on 4/2 so labs can be checked. Will have scheduler add appointment.

## 2012-07-15 ENCOUNTER — Telehealth (INDEPENDENT_AMBULATORY_CARE_PROVIDER_SITE_OTHER): Payer: Self-pay | Admitting: General Surgery

## 2012-07-15 NOTE — Telephone Encounter (Signed)
LMOM at 3:30 asking patient to call and r/s the appt that she cancelled on 3/18 because she wasn't feeling well.Marland KitchenMarland Kitchen

## 2012-07-27 ENCOUNTER — Ambulatory Visit (HOSPITAL_BASED_OUTPATIENT_CLINIC_OR_DEPARTMENT_OTHER): Payer: Medicaid Other

## 2012-07-27 ENCOUNTER — Other Ambulatory Visit (HOSPITAL_BASED_OUTPATIENT_CLINIC_OR_DEPARTMENT_OTHER): Payer: Medicaid Other | Admitting: Lab

## 2012-07-27 ENCOUNTER — Telehealth: Payer: Self-pay | Admitting: *Deleted

## 2012-07-27 ENCOUNTER — Other Ambulatory Visit: Payer: Self-pay | Admitting: Oncology

## 2012-07-27 ENCOUNTER — Ambulatory Visit (HOSPITAL_BASED_OUTPATIENT_CLINIC_OR_DEPARTMENT_OTHER): Payer: Medicaid Other | Admitting: Oncology

## 2012-07-27 ENCOUNTER — Telehealth: Payer: Self-pay | Admitting: Oncology

## 2012-07-27 VITALS — BP 153/85 | HR 69 | Temp 97.4°F | Resp 20 | Ht 70.0 in | Wt 236.6 lb

## 2012-07-27 DIAGNOSIS — C187 Malignant neoplasm of sigmoid colon: Secondary | ICD-10-CM

## 2012-07-27 DIAGNOSIS — C189 Malignant neoplasm of colon, unspecified: Secondary | ICD-10-CM

## 2012-07-27 DIAGNOSIS — D509 Iron deficiency anemia, unspecified: Secondary | ICD-10-CM

## 2012-07-27 DIAGNOSIS — G620 Drug-induced polyneuropathy: Secondary | ICD-10-CM

## 2012-07-27 DIAGNOSIS — C787 Secondary malignant neoplasm of liver and intrahepatic bile duct: Secondary | ICD-10-CM

## 2012-07-27 DIAGNOSIS — Z5111 Encounter for antineoplastic chemotherapy: Secondary | ICD-10-CM

## 2012-07-27 DIAGNOSIS — Z86718 Personal history of other venous thrombosis and embolism: Secondary | ICD-10-CM

## 2012-07-27 LAB — CBC WITH DIFFERENTIAL/PLATELET
Basophils Absolute: 0 10*3/uL (ref 0.0–0.1)
EOS%: 2.1 % (ref 0.0–7.0)
HCT: 33.3 % — ABNORMAL LOW (ref 34.8–46.6)
HGB: 10.8 g/dL — ABNORMAL LOW (ref 11.6–15.9)
LYMPH%: 37.4 % (ref 14.0–49.7)
MCH: 27.1 pg (ref 25.1–34.0)
MCHC: 32.4 g/dL (ref 31.5–36.0)
MONO#: 0.6 10*3/uL (ref 0.1–0.9)
NEUT%: 50.6 % (ref 38.4–76.8)
Platelets: 203 10*3/uL (ref 145–400)
lymph#: 2.5 10*3/uL (ref 0.9–3.3)

## 2012-07-27 MED ORDER — DEXTROSE 5 % IV SOLN
Freq: Once | INTRAVENOUS | Status: AC
  Administered 2012-07-27: 11:00:00 via INTRAVENOUS

## 2012-07-27 MED ORDER — SODIUM CHLORIDE 0.9 % IJ SOLN
10.0000 mL | INTRAMUSCULAR | Status: DC | PRN
  Administered 2012-07-27: 10 mL
  Filled 2012-07-27: qty 10

## 2012-07-27 MED ORDER — HEPARIN SOD (PORK) LOCK FLUSH 100 UNIT/ML IV SOLN
500.0000 [IU] | Freq: Once | INTRAVENOUS | Status: AC | PRN
  Administered 2012-07-27: 500 [IU]
  Filled 2012-07-27: qty 5

## 2012-07-27 MED ORDER — OXALIPLATIN CHEMO INJECTION 100 MG/20ML
100.0000 mg/m2 | Freq: Once | INTRAVENOUS | Status: AC
  Administered 2012-07-27: 225 mg via INTRAVENOUS
  Filled 2012-07-27: qty 45

## 2012-07-27 MED ORDER — ONDANSETRON 8 MG/50ML IVPB (CHCC)
8.0000 mg | Freq: Once | INTRAVENOUS | Status: AC
  Administered 2012-07-27: 8 mg via INTRAVENOUS

## 2012-07-27 MED ORDER — DEXAMETHASONE SODIUM PHOSPHATE 10 MG/ML IJ SOLN
10.0000 mg | Freq: Once | INTRAMUSCULAR | Status: AC
  Administered 2012-07-27: 10 mg via INTRAVENOUS

## 2012-07-27 NOTE — Telephone Encounter (Signed)
Per staff message and POF I have scheduled appts.  JMW  

## 2012-07-27 NOTE — Progress Notes (Signed)
   [name]    OFFICE PROGRESS NOTE   INTERVAL HISTORY:   She returns as scheduled. She completed cycle 3 of CAPOX beginning on 07/06/2012. [name] tolerated the chemotherapy well. No nausea, diarrhea, or neuropathy symptoms. No hand or foot pain.  Objective:  Vital signs in last 24 hours:  Blood pressure 153/85, pulse 69, temperature 97.4 F (36.3 C), temperature source Oral, resp. rate 20, height 5\' 10"  (1.778 m), weight 236 lb 9.6 oz (107.321 kg).    HEENT: No thrush or ulcers. Resp: Lungs clear bilaterally Cardio: Regular rate and rhythm GI: No hepatomegaly, Vascular: No leg edema Neuro: Mild decrease in vibratory sense at the fingertips bilaterally  Skin: Hyperpigmentation of the hands.   Portacath/PICC-without erythema  Lab Results:  Lab Results  Component Value Date   WBC 6.7 07/27/2012   HGB 10.8* 07/27/2012   HCT 33.3* 07/27/2012   MCV 83.7 07/27/2012   PLT 203 07/27/2012   ANC 3.4  CEA 3.4 on 06/15/2012  Medications: I have reviewed the patient's current medications.  Assessment/Plan: 1. Metastatic colon cancer (T4, N0, M1) adenocarcinoma of the sigmoid colon status post sigmoid colectomy and partial right hepatectomy 04/11/2012. She completed cycle 1 CAPOX beginning 05/25/2012.  2. Microcytic anemia, iron deficiency. Improved. She continues oral iron. 3. Markedly elevated preoperative CEA. The CEA was mildly elevated on 05/16/2012. The CEA was in normal range at 3.4 on 06/15/2012. 4. Status post Port-A-Cath placement 03/14/2012. X-ray showed the Port-A-Cath tip to be in the azygos vein. The left-sided Port-A-Cath was removed and a new right Port-A-Cath was placed on 05/23/2012. 5. 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 The plan is to proceed with cycle 4 of CAPOX today. [name]  will return for an office visit and cycle 5 in 3 weeks.  We discussed the NSABP MPR-1 study. She will consider enrollment on this clinical trial.   Thornton Papas, MD  07/27/2012  10:43 AM

## 2012-07-27 NOTE — Patient Instructions (Addendum)
 Cancer Center Discharge Instructions for Patients Receiving Chemotherapy  Today you received the following chemotherapy agents oxaliplatin  To help prevent nausea and vomiting after your treatment, we encourage you to take your nausea medication as directed by MD Begin taking it at 6 pm and take it as often as prescribed if needed.   If you develop nausea and vomiting that is not controlled by your nausea medication, call the clinic. If it is after clinic hours your family physician or the after hours number for the clinic or go to the Emergency Department.   BELOW ARE SYMPTOMS THAT SHOULD BE REPORTED IMMEDIATELY:  *FEVER GREATER THAN 100.5 F  *CHILLS WITH OR WITHOUT FEVER  NAUSEA AND VOMITING THAT IS NOT CONTROLLED WITH YOUR NAUSEA MEDICATION  *UNUSUAL SHORTNESS OF BREATH  *UNUSUAL BRUISING OR BLEEDING  TENDERNESS IN MOUTH AND THROAT WITH OR WITHOUT PRESENCE OF ULCERS  *URINARY PROBLEMS  *BOWEL PROBLEMS  UNUSUAL RASH Items with * indicate a potential emergency and should be followed up as soon as possible.   Feel free to call the clinic you have any questions or concerns. The clinic phone number is [phone].   I have been informed and understand all the instructions given to me. I know to contact the clinic, my physician, or go to the Emergency Department if any problems should occur. I do not have any questions at this time, but understand that I may call the clinic during office hours   should I have any questions or need assistance in obtaining follow up care.    __________________________________________  _____________  __________ Signature of Patient or Authorized Representative            Date                   Time    __________________________________________ Nurse's Signature

## 2012-08-14 ENCOUNTER — Other Ambulatory Visit: Payer: Self-pay | Admitting: Oncology

## 2012-08-17 ENCOUNTER — Other Ambulatory Visit: Payer: Self-pay | Admitting: Oncology

## 2012-08-17 ENCOUNTER — Ambulatory Visit (HOSPITAL_BASED_OUTPATIENT_CLINIC_OR_DEPARTMENT_OTHER): Payer: Medicaid Other | Admitting: Nurse Practitioner

## 2012-08-17 ENCOUNTER — Other Ambulatory Visit (HOSPITAL_BASED_OUTPATIENT_CLINIC_OR_DEPARTMENT_OTHER): Payer: Medicaid Other | Admitting: Lab

## 2012-08-17 ENCOUNTER — Other Ambulatory Visit: Payer: Self-pay | Admitting: *Deleted

## 2012-08-17 ENCOUNTER — Telehealth: Payer: Self-pay | Admitting: *Deleted

## 2012-08-17 ENCOUNTER — Ambulatory Visit (HOSPITAL_BASED_OUTPATIENT_CLINIC_OR_DEPARTMENT_OTHER): Payer: Medicaid Other

## 2012-08-17 VITALS — BP 145/94 | HR 73 | Temp 97.8°F | Resp 18 | Ht 70.0 in | Wt 241.8 lb

## 2012-08-17 DIAGNOSIS — Z5111 Encounter for antineoplastic chemotherapy: Secondary | ICD-10-CM

## 2012-08-17 DIAGNOSIS — C187 Malignant neoplasm of sigmoid colon: Secondary | ICD-10-CM

## 2012-08-17 DIAGNOSIS — I82A19 Acute embolism and thrombosis of unspecified axillary vein: Secondary | ICD-10-CM

## 2012-08-17 DIAGNOSIS — C787 Secondary malignant neoplasm of liver and intrahepatic bile duct: Secondary | ICD-10-CM

## 2012-08-17 DIAGNOSIS — D509 Iron deficiency anemia, unspecified: Secondary | ICD-10-CM

## 2012-08-17 DIAGNOSIS — C189 Malignant neoplasm of colon, unspecified: Secondary | ICD-10-CM

## 2012-08-17 LAB — COMPREHENSIVE METABOLIC PANEL (CC13)
ALT: 16 U/L (ref 0–55)
AST: 23 U/L (ref 5–34)
Albumin: 3.8 g/dL (ref 3.5–5.0)
CO2: 25 mEq/L (ref 22–29)
Calcium: 9.8 mg/dL (ref 8.4–10.4)
Chloride: 106 mEq/L (ref 98–107)
Creatinine: 0.8 mg/dL (ref 0.6–1.1)
Potassium: 3.5 mEq/L (ref 3.5–5.1)

## 2012-08-17 LAB — CBC WITH DIFFERENTIAL/PLATELET
Basophils Absolute: 0 10*3/uL (ref 0.0–0.1)
Eosinophils Absolute: 0.2 10*3/uL (ref 0.0–0.5)
HGB: 11.1 g/dL — ABNORMAL LOW (ref 11.6–15.9)
LYMPH%: 44.3 % (ref 14.0–49.7)
MCV: 88.9 fL (ref 79.5–101.0)
MONO#: 0.5 10*3/uL (ref 0.1–0.9)
MONO%: 8.4 % (ref 0.0–14.0)
NEUT#: 2.8 10*3/uL (ref 1.5–6.5)
Platelets: 173 10*3/uL (ref 145–400)
RBC: 3.8 10*6/uL (ref 3.70–5.45)
WBC: 6.3 10*3/uL (ref 3.9–10.3)
nRBC: 0 % (ref 0–0)

## 2012-08-17 LAB — CEA: CEA: 0.5 ng/mL (ref 0.0–5.0)

## 2012-08-17 MED ORDER — SODIUM CHLORIDE 0.9 % IJ SOLN
10.0000 mL | INTRAMUSCULAR | Status: DC | PRN
  Administered 2012-08-17: 10 mL
  Filled 2012-08-17: qty 10

## 2012-08-17 MED ORDER — CAPECITABINE 500 MG PO TABS: 2000.0000 mg | ORAL_TABLET | Freq: Two times a day (BID) | ORAL | Status: DC

## 2012-08-17 MED ORDER — OXALIPLATIN CHEMO INJECTION 100 MG/20ML
100.0000 mg/m2 | Freq: Once | INTRAVENOUS | Status: AC
  Administered 2012-08-17: 225 mg via INTRAVENOUS
  Filled 2012-08-17: qty 45

## 2012-08-17 MED ORDER — DEXAMETHASONE SODIUM PHOSPHATE 10 MG/ML IJ SOLN
10.0000 mg | Freq: Once | INTRAMUSCULAR | Status: AC
  Administered 2012-08-17: 10 mg via INTRAVENOUS

## 2012-08-17 MED ORDER — HEPARIN SOD (PORK) LOCK FLUSH 100 UNIT/ML IV SOLN
500.0000 [IU] | Freq: Once | INTRAVENOUS | Status: AC | PRN
  Administered 2012-08-17: 500 [IU]
  Filled 2012-08-17: qty 5

## 2012-08-17 MED ORDER — ONDANSETRON 8 MG/50ML IVPB (CHCC)
8.0000 mg | Freq: Once | INTRAVENOUS | Status: AC
  Administered 2012-08-17: 8 mg via INTRAVENOUS

## 2012-08-17 MED ORDER — DEXTROSE 5 % IV SOLN
Freq: Once | INTRAVENOUS | Status: AC
  Administered 2012-08-17: 12:00:00 via INTRAVENOUS

## 2012-08-17 NOTE — Telephone Encounter (Signed)
Per staff message and POF I have scheduled appts.  JMW  

## 2012-08-17 NOTE — Patient Instructions (Addendum)
[facility] Discharge Instructions for Patients Receiving Chemotherapy  Today you received the following chemotherapy agents : Oxaliplatin  To help prevent nausea and vomiting after your treatment, we encourage you to take your nausea medication : Compazine 10 mg every 6 hours as needed Begin taking it at 1st sign of nausea and take it as often as prescribed.   If you develop nausea and vomiting that is not controlled by your nausea medication, call the clinic. If it is after clinic hours your family physician or the after hours number for the clinic or go to the Emergency Department.  AVOID TOUCHING OR EATING/DRINKING COLD   BELOW ARE SYMPTOMS THAT SHOULD BE REPORTED IMMEDIATELY:  *FEVER GREATER THAN 100.5 F  *CHILLS WITH OR WITHOUT FEVER  NAUSEA AND VOMITING THAT IS NOT CONTROLLED WITH YOUR NAUSEA MEDICATION  *UNUSUAL SHORTNESS OF BREATH  *UNUSUAL BRUISING OR BLEEDING  TENDERNESS IN MOUTH AND THROAT WITH OR WITHOUT PRESENCE OF ULCERS  *URINARY PROBLEMS  *BOWEL PROBLEMS  UNUSUAL RASH Items with * indicate a potential emergency and should be followed up as soon as possible.   Feel free to call the clinic you have any questions or concerns. The clinic phone number is [phone].   I have been informed and understand all the instructions given to me. I know to contact the clinic, my physician, or go to the Emergency Department if any problems should occur. I do not have any questions at this time, but understand that I may call the clinic during office hours   should I have any questions or need assistance in obtaining follow up care.    __________________________________________  _____________  __________ Signature of Patient or Authorized Representative            Date                   Time    __________________________________________ Nurse's Signature

## 2012-08-17 NOTE — Progress Notes (Signed)
OFFICE PROGRESS NOTE  Interval history:  [name] returns as scheduled. She completed cycle 4 CAPOX beginning 07/27/2012. She had mild nausea. No vomiting. No diarrhea. No mouth sores. Cold sensitivity lasted approximately 7 days. She denies persistent neuropathy symptoms. No hand or foot pain or redness. No bleeding.   Objective: Blood pressure 145/94, pulse 73, temperature 97.8 F (36.6 C), temperature source Oral, resp. rate 18, height 5\' 10"  (1.778 m), weight 241 lb 12.8 oz (109.68 kg).  Oropharynx is without thrush or ulceration. Lungs are clear. Regular cardiac rhythm. Port-A-Cath site is without erythema. Abdomen is soft and nontender. No organomegaly. Extremities are without edema. Vibratory sense is mildly decreased over the fingertips bilaterally.  Lab Results: Lab Results  Component Value Date   WBC 6.3 08/17/2012   HGB 11.1* 08/17/2012   HCT 33.8* 08/17/2012   MCV 88.9 08/17/2012   PLT 173 08/17/2012    Chemistry:    Chemistry      Component Value Date/Time   NA 142 07/01/2012 1503   NA 138 04/15/2012 0525   K 3.6 07/01/2012 1503   K 4.3 04/15/2012 0525   CL 105 07/01/2012 1503   CL 104 04/15/2012 0525   CO2 26 07/01/2012 1503   CO2 27 04/15/2012 0525   BUN 9.4 07/01/2012 1503   BUN 7 04/15/2012 0525   CREATININE 0.8 07/01/2012 1503   CREATININE 0.82 04/15/2012 0525   CREATININE 0.89 03/02/2012 1524      Component Value Date/Time   CALCIUM 10.0 07/01/2012 1503   CALCIUM 8.9 04/15/2012 0525   ALKPHOS 95 07/01/2012 1503   ALKPHOS 69 04/14/2012 0555   AST 34 07/01/2012 1503   AST 44* 04/14/2012 0555   ALT 36 07/01/2012 1503   ALT 72* 04/14/2012 0555   BILITOT 0.40 07/01/2012 1503   BILITOT 0.4 04/14/2012 0555       Studies/Results: No results found.  Medications: I have reviewed the patient's current medications.  Assessment/Plan:  1. Metastatic colon cancer (T4, N0, M1) adenocarcinoma of the sigmoid colon status post sigmoid colectomy and partial right hepatectomy  04/11/2012. She completed cycle 1 CAPOX beginning 05/25/2012.  2. Microcytic anemia, iron deficiency. Improved. She continues oral iron. 3. Markedly elevated preoperative CEA. The CEA was mildly elevated on 05/16/2012. The CEA was in normal range at 3.4 on 06/15/2012. 4. Status post Port-A-Cath placement 03/14/2012. X-ray showed the Port-A-Cath tip to be in the azygos vein. The left-sided Port-A-Cath was removed and a new right Port-A-Cath was placed on 05/23/2012. 5. 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 Stable.  Disposition-Ms. Chiusano appears stable. She has completed 4 cycles of CAPOX chemotherapy. Plan to proceed with cycle 5 today as scheduled. She will return for a followup visit and cycle 6 in 3 weeks. She will contact the office in the interim with any problems.  Plan reviewed with Dr. Truett Perna.  [name] ANP/GNP-BC

## 2012-08-18 ENCOUNTER — Encounter (INDEPENDENT_AMBULATORY_CARE_PROVIDER_SITE_OTHER): Payer: Medicaid Other | Admitting: General Surgery

## 2012-08-31 ENCOUNTER — Ambulatory Visit (INDEPENDENT_AMBULATORY_CARE_PROVIDER_SITE_OTHER): Payer: Medicaid Other | Admitting: General Surgery

## 2012-08-31 ENCOUNTER — Encounter (INDEPENDENT_AMBULATORY_CARE_PROVIDER_SITE_OTHER): Payer: Self-pay | Admitting: General Surgery

## 2012-08-31 ENCOUNTER — Telehealth (INDEPENDENT_AMBULATORY_CARE_PROVIDER_SITE_OTHER): Payer: Self-pay

## 2012-08-31 VITALS — BP 142/88 | HR 74 | Temp 96.3°F | Resp 16 | Ht 71.0 in | Wt 241.6 lb

## 2012-08-31 DIAGNOSIS — C787 Secondary malignant neoplasm of liver and intrahepatic bile duct: Secondary | ICD-10-CM

## 2012-08-31 DIAGNOSIS — C189 Malignant neoplasm of colon, unspecified: Secondary | ICD-10-CM

## 2012-08-31 NOTE — Progress Notes (Signed)
Patient ID: [name], female   DOB: 1955/06/01, 57 y.o.   MRN: [id number] The patient is a 57 year old female status post partial hepatectomy as well as right hemicolectomy. Patient has been doing well has been seeing Dr. Myrle Sheng for her postop chemotherapy.  The patient has been tolerating it well and has no complaints at this time. The patient's last CEA level was 0.5.  Patient has been tolerating a diet and is having normal bowel function.  On exam: Port-A-Cath was clean dry and intact  Assessment and plan: 57 year old female status post hemicolectomy And partial liver resection. 1. We will have the patient follow back up in 6 months.

## 2012-08-31 NOTE — Telephone Encounter (Signed)
Called and spoke to patient to see if she could come in for an earlier appt today.  Patient states the earliest she could be here would be around 3:00 pm today due to transportation.

## 2012-09-07 ENCOUNTER — Ambulatory Visit (HOSPITAL_BASED_OUTPATIENT_CLINIC_OR_DEPARTMENT_OTHER): Payer: Medicaid Other | Admitting: Oncology

## 2012-09-07 ENCOUNTER — Ambulatory Visit (HOSPITAL_BASED_OUTPATIENT_CLINIC_OR_DEPARTMENT_OTHER): Payer: Medicaid Other

## 2012-09-07 ENCOUNTER — Other Ambulatory Visit (HOSPITAL_BASED_OUTPATIENT_CLINIC_OR_DEPARTMENT_OTHER): Payer: Medicaid Other | Admitting: Lab

## 2012-09-07 ENCOUNTER — Telehealth: Payer: Self-pay | Admitting: Oncology

## 2012-09-07 VITALS — BP 144/103 | HR 76 | Temp 97.3°F | Resp 18 | Ht 71.0 in | Wt 242.4 lb

## 2012-09-07 VITALS — BP 130/87 | HR 69 | Resp 18

## 2012-09-07 DIAGNOSIS — C187 Malignant neoplasm of sigmoid colon: Secondary | ICD-10-CM

## 2012-09-07 DIAGNOSIS — C787 Secondary malignant neoplasm of liver and intrahepatic bile duct: Secondary | ICD-10-CM

## 2012-09-07 DIAGNOSIS — C189 Malignant neoplasm of colon, unspecified: Secondary | ICD-10-CM

## 2012-09-07 DIAGNOSIS — I82B19 Acute embolism and thrombosis of unspecified subclavian vein: Secondary | ICD-10-CM

## 2012-09-07 DIAGNOSIS — Z5111 Encounter for antineoplastic chemotherapy: Secondary | ICD-10-CM

## 2012-09-07 DIAGNOSIS — D509 Iron deficiency anemia, unspecified: Secondary | ICD-10-CM

## 2012-09-07 LAB — CBC WITH DIFFERENTIAL/PLATELET
BASO%: 0.8 % (ref 0.0–2.0)
Basophils Absolute: 0.1 10*3/uL (ref 0.0–0.1)
Eosinophils Absolute: 0.2 10*3/uL (ref 0.0–0.5)
HCT: 34.3 % — ABNORMAL LOW (ref 34.8–46.6)
HGB: 11.6 g/dL (ref 11.6–15.9)
LYMPH%: 42.5 % (ref 14.0–49.7)
MCHC: 33.8 g/dL (ref 31.5–36.0)
MONO#: 0.5 10*3/uL (ref 0.1–0.9)
NEUT#: 3 10*3/uL (ref 1.5–6.5)
NEUT%: 46 % (ref 38.4–76.8)
Platelets: 210 10*3/uL (ref 145–400)
WBC: 6.6 10*3/uL (ref 3.9–10.3)
lymph#: 2.8 10*3/uL (ref 0.9–3.3)

## 2012-09-07 LAB — COMPREHENSIVE METABOLIC PANEL (CC13)
ALT: 24 U/L (ref 0–55)
CO2: 24 mEq/L (ref 22–29)
Calcium: 9.5 mg/dL (ref 8.4–10.4)
Chloride: 107 mEq/L (ref 98–107)
Creatinine: 0.8 mg/dL (ref 0.6–1.1)
Glucose: 95 mg/dl (ref 70–99)
Total Bilirubin: 0.68 mg/dL (ref 0.20–1.20)

## 2012-09-07 MED ORDER — SODIUM CHLORIDE 0.9 % IJ SOLN
10.0000 mL | INTRAMUSCULAR | Status: DC | PRN
  Administered 2012-09-07: 10 mL
  Filled 2012-09-07: qty 10

## 2012-09-07 MED ORDER — DEXTROSE 5 % IV SOLN
Freq: Once | INTRAVENOUS | Status: AC
  Administered 2012-09-07: 12:00:00 via INTRAVENOUS

## 2012-09-07 MED ORDER — OXALIPLATIN CHEMO INJECTION 100 MG/20ML
100.0000 mg/m2 | Freq: Once | INTRAVENOUS | Status: AC
  Administered 2012-09-07: 225 mg via INTRAVENOUS
  Filled 2012-09-07: qty 45

## 2012-09-07 MED ORDER — DEXAMETHASONE SODIUM PHOSPHATE 10 MG/ML IJ SOLN
10.0000 mg | Freq: Once | INTRAMUSCULAR | Status: AC
  Administered 2012-09-07: 10 mg via INTRAVENOUS

## 2012-09-07 MED ORDER — ONDANSETRON 8 MG/50ML IVPB (CHCC)
8.0000 mg | Freq: Once | INTRAVENOUS | Status: AC
  Administered 2012-09-07: 8 mg via INTRAVENOUS

## 2012-09-07 MED ORDER — HEPARIN SOD (PORK) LOCK FLUSH 100 UNIT/ML IV SOLN
500.0000 [IU] | Freq: Once | INTRAVENOUS | Status: AC | PRN
  Administered 2012-09-07: 500 [IU]
  Filled 2012-09-07: qty 5

## 2012-09-07 NOTE — Patient Instructions (Addendum)
[facility] Discharge Instructions for Patients Receiving Chemotherapy  Today you received the following chemotherapy agents Oxaliplatin.  To help prevent nausea and vomiting after your treatment, we encourage you to take your nausea medication as prescribed.   If you develop nausea and vomiting that is not controlled by your nausea medication, call the clinic. If it is after clinic hours your family physician or the after hours number for the clinic or go to the Emergency Department.   BELOW ARE SYMPTOMS THAT SHOULD BE REPORTED IMMEDIATELY:  *FEVER GREATER THAN 100.5 F  *CHILLS WITH OR WITHOUT FEVER  NAUSEA AND VOMITING THAT IS NOT CONTROLLED WITH YOUR NAUSEA MEDICATION  *UNUSUAL SHORTNESS OF BREATH  *UNUSUAL BRUISING OR BLEEDING  TENDERNESS IN MOUTH AND THROAT WITH OR WITHOUT PRESENCE OF ULCERS  *URINARY PROBLEMS  *BOWEL PROBLEMS  UNUSUAL RASH Items with * indicate a potential emergency and should be followed up as soon as possible.  Feel free to call the clinic you have any questions or concerns. The clinic phone number is [phone].   I have been informed and understand all the instructions given to me. I know to contact the clinic, my physician, or go to the Emergency Department if any problems should occur. I do not have any questions at this time, but understand that I may call the clinic during office hours   should I have any questions or need assistance in obtaining follow up care.    __________________________________________  _____________  __________ Signature of Patient or Authorized Representative            Date                   Time    __________________________________________ Nurse's Signature

## 2012-09-07 NOTE — Progress Notes (Signed)
   [facility]    OFFICE PROGRESS NOTE   INTERVAL HISTORY:   [name] returns as scheduled. She completed cycle 5 of CAPOX beginning on 08/17/2012. She tolerated chemotherapy well. Mild nausea. Cold sensitivity last 5-6 days after chemotherapy. No neuropathy symptoms today. No complaint.  Objective:  Vital signs in last 24 hours:  Blood pressure 144/103, pulse 76, temperature 97.3 F (36.3 C), temperature source Oral, resp. rate 18, height 5\' 11"  (1.803 m), weight 242 lb 6.4 oz (109.952 kg).    HEENT: No thrush or ulcers Resp: Lungs clear bilaterally Cardio: Regular rate and rhythm GI: No hepatomegaly, nontender Vascular: No leg edema Neuro: The vibratory sense is intact at the fingertips bilaterally    Portacath/PICC-without erythema  Lab Results:  Lab Results  Component Value Date   WBC 6.6 09/07/2012   HGB 11.6 09/07/2012   HCT 34.3* 09/07/2012   MCV 92.2 09/07/2012   PLT 210 09/07/2012   ANC 3.0   Medications: I have reviewed the patient's current medications.  Assessment/Plan: 1. Metastatic colon cancer (T4, N0, M1) adenocarcinoma of the sigmoid colon status post sigmoid colectomy and partial right hepatectomy 04/11/2012. She completed cycle 1 CAPOX beginning 05/25/2012.  2. Microcytic anemia, iron deficiency. Improved. She continues oral iron. 3. Markedly elevated preoperative CEA. The CEA was mildly elevated on 05/16/2012. The CEA was in normal range at 0.5 on 08/17/2012.  4. Status post Port-A-Cath placement 03/14/2012. X-ray showed the Port-A-Cath tip to be in the azygos vein. The left-sided Port-A-Cath was removed and a new right Port-A-Cath was placed on 05/23/2012. 5. 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 Stable. Exam was normal today 7. Hypertension-she has not taken her blood pressure  medication for the past 2 days.  Disposition:  [name] continues to tolerate adjuvant chemotherapy well. The plan is to proceed with cycle 6 of CAPOX today. She will return for an office visit and chemotherapy in 3 weeks.   Thornton Papas, MD  09/07/2012  10:48 AM

## 2012-09-07 NOTE — Telephone Encounter (Signed)
gv pt appt schedule for June.  °

## 2012-09-25 ENCOUNTER — Other Ambulatory Visit: Payer: Self-pay | Admitting: Oncology

## 2012-09-27 ENCOUNTER — Other Ambulatory Visit: Payer: Self-pay | Admitting: Oncology

## 2012-09-27 DIAGNOSIS — C787 Secondary malignant neoplasm of liver and intrahepatic bile duct: Secondary | ICD-10-CM

## 2012-09-27 DIAGNOSIS — C189 Malignant neoplasm of colon, unspecified: Secondary | ICD-10-CM

## 2012-09-28 ENCOUNTER — Ambulatory Visit (HOSPITAL_BASED_OUTPATIENT_CLINIC_OR_DEPARTMENT_OTHER): Payer: Medicaid Other | Admitting: Nurse Practitioner

## 2012-09-28 ENCOUNTER — Other Ambulatory Visit (HOSPITAL_BASED_OUTPATIENT_CLINIC_OR_DEPARTMENT_OTHER): Payer: Medicaid Other

## 2012-09-28 ENCOUNTER — Telehealth: Payer: Self-pay | Admitting: *Deleted

## 2012-09-28 ENCOUNTER — Other Ambulatory Visit: Payer: Self-pay | Admitting: *Deleted

## 2012-09-28 ENCOUNTER — Ambulatory Visit (HOSPITAL_BASED_OUTPATIENT_CLINIC_OR_DEPARTMENT_OTHER): Payer: Medicaid Other

## 2012-09-28 ENCOUNTER — Telehealth: Payer: Self-pay | Admitting: Oncology

## 2012-09-28 VITALS — BP 148/90 | Temp 96.9°F | Resp 18 | Ht 71.0 in | Wt 243.2 lb

## 2012-09-28 DIAGNOSIS — Z5111 Encounter for antineoplastic chemotherapy: Secondary | ICD-10-CM

## 2012-09-28 DIAGNOSIS — I82B19 Acute embolism and thrombosis of unspecified subclavian vein: Secondary | ICD-10-CM

## 2012-09-28 DIAGNOSIS — C189 Malignant neoplasm of colon, unspecified: Secondary | ICD-10-CM

## 2012-09-28 DIAGNOSIS — C787 Secondary malignant neoplasm of liver and intrahepatic bile duct: Secondary | ICD-10-CM

## 2012-09-28 DIAGNOSIS — C187 Malignant neoplasm of sigmoid colon: Secondary | ICD-10-CM

## 2012-09-28 DIAGNOSIS — D509 Iron deficiency anemia, unspecified: Secondary | ICD-10-CM

## 2012-09-28 LAB — COMPREHENSIVE METABOLIC PANEL (CC13)
ALT: 20 U/L (ref 0–55)
AST: 24 U/L (ref 5–34)
Alkaline Phosphatase: 81 U/L (ref 40–150)
BUN: 8.7 mg/dL (ref 7.0–26.0)
Creatinine: 0.8 mg/dL (ref 0.6–1.1)
Potassium: 3.3 mEq/L — ABNORMAL LOW (ref 3.5–5.1)

## 2012-09-28 LAB — CBC WITH DIFFERENTIAL/PLATELET
Basophils Absolute: 0 10*3/uL (ref 0.0–0.1)
EOS%: 2.1 % (ref 0.0–7.0)
HCT: 35.1 % (ref 34.8–46.6)
HGB: 11.9 g/dL (ref 11.6–15.9)
LYMPH%: 43.7 % (ref 14.0–49.7)
MCH: 32.2 pg (ref 25.1–34.0)
MCV: 94.9 fL (ref 79.5–101.0)
MONO%: 9.6 % (ref 0.0–14.0)
NEUT%: 44.1 % (ref 38.4–76.8)

## 2012-09-28 MED ORDER — OXALIPLATIN CHEMO INJECTION 100 MG/20ML
100.0000 mg/m2 | Freq: Once | INTRAVENOUS | Status: AC
  Administered 2012-09-28: 225 mg via INTRAVENOUS
  Filled 2012-09-28: qty 45

## 2012-09-28 MED ORDER — DEXTROSE 5 % IV SOLN
Freq: Once | INTRAVENOUS | Status: AC
  Administered 2012-09-28: 12:00:00 via INTRAVENOUS

## 2012-09-28 MED ORDER — HEPARIN SOD (PORK) LOCK FLUSH 100 UNIT/ML IV SOLN
500.0000 [IU] | Freq: Once | INTRAVENOUS | Status: AC | PRN
  Administered 2012-09-28: 500 [IU]
  Filled 2012-09-28: qty 5

## 2012-09-28 MED ORDER — DEXAMETHASONE SODIUM PHOSPHATE 10 MG/ML IJ SOLN
10.0000 mg | Freq: Once | INTRAMUSCULAR | Status: AC
  Administered 2012-09-28: 10 mg via INTRAVENOUS

## 2012-09-28 MED ORDER — SODIUM CHLORIDE 0.9 % IJ SOLN
10.0000 mL | INTRAMUSCULAR | Status: DC | PRN
  Administered 2012-09-28: 10 mL
  Filled 2012-09-28: qty 10

## 2012-09-28 MED ORDER — ONDANSETRON 8 MG/50ML IVPB (CHCC)
8.0000 mg | Freq: Once | INTRAVENOUS | Status: AC
  Administered 2012-09-28: 8 mg via INTRAVENOUS

## 2012-09-28 NOTE — Progress Notes (Signed)
OFFICE PROGRESS NOTE  Interval history:  [name] returns as scheduled. She completed cycle 6 CAPOX beginning 09/07/2012. She had mild nausea and some loose stools. No mouth sores. No redness or pain on her hands or feet. No numbness or tingling in her hands or feet. The cold sensitivity lasted approximately 5 days. She denies any bleeding.   Objective: Blood pressure 148/90, temperature 96.9 F (36.1 C), temperature source Oral, resp. rate 18, height 5\' 11"  (1.803 m), weight 243 lb 3.2 oz (110.315 kg).  Oropharynx is without thrush or ulceration. Lungs are clear. Regular cardiac rhythm. Port-A-Cath site without erythema. Abdomen is soft and nontender. No hepatomegaly. Extremities are without edema. Vibratory sense intact over the fingertips bilaterally.  Lab Results: Lab Results  Component Value Date   WBC 5.7 09/28/2012   HGB 11.9 09/28/2012   HCT 35.1 09/28/2012   MCV 94.9 09/28/2012   PLT 201 09/28/2012    Chemistry:    Chemistry      Component Value Date/Time   NA 140 09/07/2012 0833   NA 138 04/15/2012 0525   K 3.5 09/07/2012 0833   K 4.3 04/15/2012 0525   CL 107 09/07/2012 0833   CL 104 04/15/2012 0525   CO2 24 09/07/2012 0833   CO2 27 04/15/2012 0525   BUN 14.2 09/07/2012 0833   BUN 7 04/15/2012 0525   CREATININE 0.8 09/07/2012 0833   CREATININE 0.82 04/15/2012 0525   CREATININE 0.89 03/02/2012 1524      Component Value Date/Time   CALCIUM 9.5 09/07/2012 0833   CALCIUM 8.9 04/15/2012 0525   ALKPHOS 77 09/07/2012 0833   ALKPHOS 69 04/14/2012 0555   AST 28 09/07/2012 0833   AST 44* 04/14/2012 0555   ALT 24 09/07/2012 0833   ALT 72* 04/14/2012 0555   BILITOT 0.68 09/07/2012 0833   BILITOT 0.4 04/14/2012 0555       Studies/Results: No results found.  Medications: I have reviewed the patient's current medications.  Assessment/Plan:  1. Metastatic colon cancer (T4, N0, M1) adenocarcinoma of the sigmoid colon status post sigmoid colectomy and partial right hepatectomy  04/11/2012. She completed cycle 1 CAPOX beginning 05/25/2012.  2. Microcytic anemia, iron deficiency. Improved. She continues oral iron.  3. Markedly elevated preoperative CEA. The CEA was mildly elevated on 05/16/2012. The CEA was in normal range at 0.5 on 08/17/2012.  4. Status post Port-A-Cath placement 03/14/2012. X-ray showed the Port-A-Cath tip to be in the azygos vein. The left-sided Port-A-Cath was removed and a new right Port-A-Cath was placed on 05/23/2012. 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  5. 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 Stable. Exam was normal today.  6. Hypertension.  Disposition-[name] appear stable. She has completed 6 cycles of CAPOX chemotherapy. Plan to proceed with cycle 7 today as scheduled. She will return for a followup visit and the eighth and final cycle in 3 weeks. She will contact the office in the interim with any problems.   Lonna Cobb ANP/GNP-BC

## 2012-09-28 NOTE — Telephone Encounter (Signed)
gv and printed appt sched and avs for pt...emailed MW to add tx... °

## 2012-09-28 NOTE — Patient Instructions (Addendum)
[facility] Discharge Instructions for Patients Receiving Chemotherapy  Today you received the following chemotherapy agents :  Oxaliplatin.  To help prevent nausea and vomiting after your treatment, we encourage you to take your nausea medication as instructed by your physician.   If you develop nausea and vomiting that is not controlled by your nausea medication, call the clinic.   BELOW ARE SYMPTOMS THAT SHOULD BE REPORTED IMMEDIATELY:  *FEVER GREATER THAN 100.5 F  *CHILLS WITH OR WITHOUT FEVER  NAUSEA AND VOMITING THAT IS NOT CONTROLLED WITH YOUR NAUSEA MEDICATION  *UNUSUAL SHORTNESS OF BREATH  *UNUSUAL BRUISING OR BLEEDING  TENDERNESS IN MOUTH AND THROAT WITH OR WITHOUT PRESENCE OF ULCERS  *URINARY PROBLEMS  *BOWEL PROBLEMS  UNUSUAL RASH Items with * indicate a potential emergency and should be followed up as soon as possible.  Feel free to call the clinic you have any questions or concerns. The clinic phone number is [phone].    

## 2012-09-28 NOTE — Telephone Encounter (Signed)
Per staff message and POF I have scheduled appts.  JMW  

## 2012-10-16 ENCOUNTER — Other Ambulatory Visit: Payer: Self-pay | Admitting: Oncology

## 2012-10-19 ENCOUNTER — Ambulatory Visit (HOSPITAL_BASED_OUTPATIENT_CLINIC_OR_DEPARTMENT_OTHER): Payer: Medicaid Other | Admitting: Oncology

## 2012-10-19 ENCOUNTER — Ambulatory Visit (HOSPITAL_BASED_OUTPATIENT_CLINIC_OR_DEPARTMENT_OTHER): Payer: Medicaid Other

## 2012-10-19 ENCOUNTER — Other Ambulatory Visit (HOSPITAL_BASED_OUTPATIENT_CLINIC_OR_DEPARTMENT_OTHER): Payer: Medicaid Other | Admitting: Lab

## 2012-10-19 ENCOUNTER — Telehealth: Payer: Self-pay | Admitting: Oncology

## 2012-10-19 VITALS — BP 160/92 | HR 65 | Temp 97.0°F | Resp 18 | Ht 71.0 in | Wt 240.2 lb

## 2012-10-19 DIAGNOSIS — C187 Malignant neoplasm of sigmoid colon: Secondary | ICD-10-CM

## 2012-10-19 DIAGNOSIS — C189 Malignant neoplasm of colon, unspecified: Secondary | ICD-10-CM

## 2012-10-19 DIAGNOSIS — Z5111 Encounter for antineoplastic chemotherapy: Secondary | ICD-10-CM

## 2012-10-19 DIAGNOSIS — I10 Essential (primary) hypertension: Secondary | ICD-10-CM

## 2012-10-19 DIAGNOSIS — C787 Secondary malignant neoplasm of liver and intrahepatic bile duct: Secondary | ICD-10-CM

## 2012-10-19 DIAGNOSIS — D509 Iron deficiency anemia, unspecified: Secondary | ICD-10-CM

## 2012-10-19 DIAGNOSIS — I82B19 Acute embolism and thrombosis of unspecified subclavian vein: Secondary | ICD-10-CM

## 2012-10-19 LAB — COMPREHENSIVE METABOLIC PANEL (CC13)
ALT: 17 U/L (ref 0–55)
AST: 23 U/L (ref 5–34)
Albumin: 4 g/dL (ref 3.5–5.0)
CO2: 28 mEq/L (ref 22–29)
Calcium: 10.3 mg/dL (ref 8.4–10.4)
Chloride: 105 mEq/L (ref 98–107)
Potassium: 3 mEq/L — CL (ref 3.5–5.1)
Sodium: 141 mEq/L (ref 136–145)
Total Protein: 8.2 g/dL (ref 6.4–8.3)

## 2012-10-19 LAB — CBC WITH DIFFERENTIAL/PLATELET
BASO%: 0.7 % (ref 0.0–2.0)
Eosinophils Absolute: 0.1 10*3/uL (ref 0.0–0.5)
LYMPH%: 42.9 % (ref 14.0–49.7)
MCH: 33.1 pg (ref 25.1–34.0)
MCHC: 34.6 g/dL (ref 31.5–36.0)
MCV: 95.5 fL (ref 79.5–101.0)
MONO%: 8.2 % (ref 0.0–14.0)
Platelets: 189 10*3/uL (ref 145–400)
RBC: 3.81 10*6/uL (ref 3.70–5.45)

## 2012-10-19 MED ORDER — ONDANSETRON 8 MG/50ML IVPB (CHCC)
8.0000 mg | Freq: Once | INTRAVENOUS | Status: AC
  Administered 2012-10-19: 8 mg via INTRAVENOUS

## 2012-10-19 MED ORDER — SODIUM CHLORIDE 0.9 % IJ SOLN
10.0000 mL | INTRAMUSCULAR | Status: DC | PRN
  Administered 2012-10-19: 10 mL
  Filled 2012-10-19: qty 10

## 2012-10-19 MED ORDER — HEPARIN SOD (PORK) LOCK FLUSH 100 UNIT/ML IV SOLN
500.0000 [IU] | Freq: Once | INTRAVENOUS | Status: AC | PRN
  Administered 2012-10-19: 500 [IU]
  Filled 2012-10-19: qty 5

## 2012-10-19 MED ORDER — CAPECITABINE 500 MG PO TABS: ORAL_TABLET | ORAL | Status: DC

## 2012-10-19 MED ORDER — DEXTROSE 5 % IV SOLN
100.0000 mg/m2 | Freq: Once | INTRAVENOUS | Status: AC
  Administered 2012-10-19: 225 mg via INTRAVENOUS
  Filled 2012-10-19: qty 45

## 2012-10-19 MED ORDER — DEXTROSE 5 % IV SOLN
Freq: Once | INTRAVENOUS | Status: AC
  Administered 2012-10-19: 12:00:00 via INTRAVENOUS

## 2012-10-19 MED ORDER — POTASSIUM CHLORIDE CRYS ER 20 MEQ PO TBCR: 20.0000 meq | EXTENDED_RELEASE_TABLET | Freq: Every day | ORAL | Status: DC

## 2012-10-19 MED ORDER — DEXAMETHASONE SODIUM PHOSPHATE 10 MG/ML IJ SOLN
10.0000 mg | Freq: Once | INTRAMUSCULAR | Status: AC
  Administered 2012-10-19: 10 mg via INTRAVENOUS

## 2012-10-19 NOTE — Addendum Note (Signed)
Addended by: Wandalee Ferdinand on: 10/19/2012 01:22 PM   Modules accepted: Orders

## 2012-10-19 NOTE — Telephone Encounter (Signed)
gv and printed appt sched and avs for pt  °

## 2012-10-19 NOTE — Progress Notes (Signed)
   [facility]    OFFICE PROGRESS NOTE   INTERVAL HISTORY:   She returns as scheduled. She completed cycle 7 of CAPOX beginning on 09/28/2012. She tolerated the chemotherapy well. Mild nausea following chemotherapy. Mild diarrhea for 2 days. She had cold sensitivity following chemotherapy. This has resolved. No neuropathy symptoms at present. No complaint.  Objective:  Vital signs in last 24 hours:  Blood pressure 160/92, pulse 65, temperature 97 F (36.1 C), temperature source Oral, resp. rate 18, height 5\' 11"  (1.803 m), weight 240 lb 3.2 oz (108.954 kg).    HEENT: No thrush or ulcers Resp: Lungs clear bilaterally Cardio: Regular rate and rhythm GI: No hepatomegaly, nontender Vascular: No leg edema Neuro: The vibratory sense is intact at the fingertips bilaterally  Skin: Hyperpigmentation of the hands. No erythema or skin breakdown at the hands or left foot   Portacath/PICC-without erythema  Lab Results:  Lab Results  Component Value Date   WBC 5.9 10/19/2012   HGB 12.6 10/19/2012   HCT 36.4 10/19/2012   MCV 95.5 10/19/2012   PLT 189 10/19/2012   ANC 2.7    Medications: I have reviewed the patient's current medications.  Assessment/Plan: 1. Metastatic colon cancer (T4, N0, M1) adenocarcinoma of the sigmoid colon status post sigmoid colectomy and partial right hepatectomy 04/11/2012. She completed cycle 1 CAPOX beginning 05/25/2012.  2. Microcytic anemia, iron deficiency. Improved. She continues oral iron.  3. Markedly elevated preoperative CEA. The CEA was mildly elevated on 05/16/2012. The CEA was in normal range at 0.5 on 08/17/2012.  4. Status post Port-A-Cath placement 03/14/2012. X-ray showed the Port-A-Cath tip to be in the azygos vein. The left-sided Port-A-Cath was removed and a new right Port-A-Cath was placed on 05/23/2012. 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    5. Hypertension-her blood pressure has been consistently elevated here. I recommend she followup with Dr. Virgina Organ  Disposition:  Ms. Armes appears well. She will complete the final cycle of adjuvant CAPOX therapy beginning today. We checked a CEA today. She will return for an office visit and Port-A-Cath flush in 6 weeks. The plan is to keep the Port-A-Cath in place for now. We will schedule a restaging CT evaluation in approximately 3 months.   Thornton Papas, MD  10/19/2012  11:01 AM

## 2012-10-19 NOTE — Patient Instructions (Signed)
Your potassium level is low today at 3.0. You need to begin KCl 20 meq by mouth twice daily X 2 days, then once daily. Your lab will be checked again at next visit.

## 2012-11-15 ENCOUNTER — Telehealth: Payer: Self-pay | Admitting: *Deleted

## 2012-11-15 NOTE — Telephone Encounter (Signed)
Pt called to say that she only has 5 Xarelto tablets, did not receive papers in mail from [facility], needs to be re-enrollled with Medicaid office tomorrow 11/16/12. She is concerned about running out of Xarelto. RN called Development worker, community for advice, they suggested calling Xarelto company. RN spoke with Xarelto rep who states that patient can get a 30 day trial by calling [phone].  RN called patient a to tell her about trial offer, pt states she will call Xarelto today.

## 2012-11-29 ENCOUNTER — Other Ambulatory Visit: Payer: Self-pay | Admitting: *Deleted

## 2012-11-30 ENCOUNTER — Other Ambulatory Visit (HOSPITAL_BASED_OUTPATIENT_CLINIC_OR_DEPARTMENT_OTHER): Payer: Medicaid Other | Admitting: Lab

## 2012-11-30 ENCOUNTER — Ambulatory Visit: Payer: Medicaid Other

## 2012-11-30 ENCOUNTER — Ambulatory Visit (HOSPITAL_BASED_OUTPATIENT_CLINIC_OR_DEPARTMENT_OTHER): Payer: Medicaid Other | Admitting: Nurse Practitioner

## 2012-11-30 ENCOUNTER — Telehealth: Payer: Self-pay | Admitting: Oncology

## 2012-11-30 VITALS — BP 155/90 | HR 64 | Temp 97.0°F | Resp 18 | Ht 71.0 in | Wt 237.5 lb

## 2012-11-30 DIAGNOSIS — C189 Malignant neoplasm of colon, unspecified: Secondary | ICD-10-CM

## 2012-11-30 DIAGNOSIS — C787 Secondary malignant neoplasm of liver and intrahepatic bile duct: Secondary | ICD-10-CM

## 2012-11-30 DIAGNOSIS — D126 Benign neoplasm of colon, unspecified: Secondary | ICD-10-CM

## 2012-11-30 LAB — BASIC METABOLIC PANEL (CC13)
BUN: 16.1 mg/dL (ref 7.0–26.0)
Calcium: 10.1 mg/dL (ref 8.4–10.4)
Creatinine: 0.9 mg/dL (ref 0.6–1.1)

## 2012-11-30 MED ORDER — SODIUM CHLORIDE 0.9 % IJ SOLN
10.0000 mL | INTRAMUSCULAR | Status: DC | PRN
  Administered 2012-11-30: 10 mL via INTRAVENOUS
  Filled 2012-11-30: qty 10

## 2012-11-30 MED ORDER — HEPARIN SOD (PORK) LOCK FLUSH 100 UNIT/ML IV SOLN
500.0000 [IU] | Freq: Once | INTRAVENOUS | Status: AC
  Administered 2012-11-30: 500 [IU] via INTRAVENOUS
  Filled 2012-11-30: qty 5

## 2012-11-30 NOTE — Patient Instructions (Addendum)

## 2012-11-30 NOTE — Progress Notes (Signed)
OFFICE PROGRESS NOTE  Interval history:  [name] returns as scheduled. She completed the eighth and final cycle of CAPOX beginning 10/19/2012. She overall is feeling well. She has a good appetite. No nausea or vomiting. Bowels moving regularly. No diarrhea. She denies abdominal pain. She has mild intermittent numbness in the fingertips and toes.  She continues Xarelto. She denies bleeding.   Objective: Blood pressure 155/90, pulse 64, temperature 97 F (36.1 C), temperature source Oral, resp. rate 18, height 5\' 11"  (1.803 m), weight 237 lb 8 oz (107.729 kg).  Oropharynx is without thrush or ulceration. No palpable cervical, supraclavicular, axillary or inguinal lymph nodes. Lungs are clear. Regular cardiac rhythm. Port-A-Cath site is without erythema. Abdomen is soft and nontender. No hepatomegaly. Extremities are without edema. Vibratory sense is mildly to moderately decreased over the fingertips per tuning fork exam.  Lab Results: Lab Results  Component Value Date   WBC 5.9 10/19/2012   HGB 12.6 10/19/2012   HCT 36.4 10/19/2012   MCV 95.5 10/19/2012   PLT 189 10/19/2012    Chemistry:    Chemistry      Component Value Date/Time   NA 142 11/30/2012 0841   NA 138 04/15/2012 0525   K 4.1 11/30/2012 0841   K 4.3 04/15/2012 0525   CL 105 10/19/2012 0928   CL 104 04/15/2012 0525   CO2 24 11/30/2012 0841   CO2 27 04/15/2012 0525   BUN 16.1 11/30/2012 0841   BUN 7 04/15/2012 0525   CREATININE 0.9 11/30/2012 0841   CREATININE 0.82 04/15/2012 0525   CREATININE 0.89 03/02/2012 1524      Component Value Date/Time   CALCIUM 10.1 11/30/2012 0841   CALCIUM 8.9 04/15/2012 0525   ALKPHOS 78 10/19/2012 0928   ALKPHOS 69 04/14/2012 0555   AST 23 10/19/2012 0928   AST 44* 04/14/2012 0555   ALT 17 10/19/2012 0928   ALT 72* 04/14/2012 0555   BILITOT 0.77 10/19/2012 0928   BILITOT 0.4 04/14/2012 0555       Studies/Results: No results found.  Medications: I have reviewed the patient's current  medications.  Assessment/Plan:  1. Metastatic colon cancer (T4, N0, M1) adenocarcinoma of the sigmoid colon status post sigmoid colectomy and partial right hepatectomy 04/11/2012.   She completed cycle 1 CAPOX beginning 05/25/2012.   She completed cycle 8 CAPOX beginning 10/19/2012.  CEA on 10/19/2012 normal at 0.8. 2. Microcytic anemia, iron deficiency. Improved. She continues oral iron.  3. Markedly elevated preoperative CEA. The CEA was mildly elevated on 05/16/2012. The CEA was in normal range at 0.5 on 08/17/2012 and 0.8 on 10/19/2012.  4. Status post Port-A-Cath placement 03/14/2012. X-ray showed the Port-A-Cath tip to be in the azygos vein. The left-sided Port-A-Cath was removed and a new right Port-A-Cath was placed on 05/23/2012. Port-A-Cath was flushed today. 5. 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  6. Hypertension-her blood pressure has been consistently elevated here. It was recommended she followup with Dr. Virgina Organ.  Disposition-[name] appears stable. She is in clinical remission from colon cancer. Dr. Truett Perna recommends a restaging CT evaluation in approximately 6 weeks. She will return for a followup visit 2-3 days after the scans to review the results.  Plan reviewed with Dr. Truett Perna.  [name] ANP/GNP-BC

## 2012-11-30 NOTE — Telephone Encounter (Signed)
gv pt appt schedule for September. [facility] will contact pt w/ct appt - pt aware.

## 2012-12-01 ENCOUNTER — Telehealth: Payer: Self-pay | Admitting: Oncology

## 2012-12-01 NOTE — Telephone Encounter (Signed)
[name] from central called today to move 9/17 lb appt closer to 11:30am ct. Lab moved to 10:30am. Central will inform pt.

## 2012-12-20 ENCOUNTER — Other Ambulatory Visit: Payer: Self-pay | Admitting: *Deleted

## 2012-12-20 DIAGNOSIS — I82401 Acute embolism and thrombosis of unspecified deep veins of right lower extremity: Secondary | ICD-10-CM

## 2012-12-20 MED ORDER — RIVAROXABAN 20 MG PO TABS: 20.0000 mg | ORAL_TABLET | Freq: Every day | ORAL | Status: DC

## 2013-01-11 ENCOUNTER — Other Ambulatory Visit (HOSPITAL_BASED_OUTPATIENT_CLINIC_OR_DEPARTMENT_OTHER): Payer: Medicaid Other | Admitting: Lab

## 2013-01-11 ENCOUNTER — Encounter (HOSPITAL_COMMUNITY): Payer: Self-pay

## 2013-01-11 ENCOUNTER — Other Ambulatory Visit: Payer: Self-pay | Admitting: Nurse Practitioner

## 2013-01-11 ENCOUNTER — Ambulatory Visit (HOSPITAL_COMMUNITY)
Admission: RE | Admit: 2013-01-11 | Discharge: 2013-01-11 | Disposition: A | Discharge status: Home / Self Care | Payer: Medicaid Other | Source: Ambulatory Visit | Attending: Nurse Practitioner | Admitting: Nurse Practitioner

## 2013-01-11 DIAGNOSIS — C189 Malignant neoplasm of colon, unspecified: Secondary | ICD-10-CM

## 2013-01-11 DIAGNOSIS — Z90710 Acquired absence of both cervix and uterus: Secondary | ICD-10-CM | POA: Insufficient documentation

## 2013-01-11 DIAGNOSIS — Z9089 Acquired absence of other organs: Secondary | ICD-10-CM | POA: Insufficient documentation

## 2013-01-11 DIAGNOSIS — K429 Umbilical hernia without obstruction or gangrene: Secondary | ICD-10-CM | POA: Insufficient documentation

## 2013-01-11 DIAGNOSIS — R918 Other nonspecific abnormal finding of lung field: Secondary | ICD-10-CM | POA: Insufficient documentation

## 2013-01-11 DIAGNOSIS — C187 Malignant neoplasm of sigmoid colon: Secondary | ICD-10-CM

## 2013-01-11 DIAGNOSIS — Z9221 Personal history of antineoplastic chemotherapy: Secondary | ICD-10-CM | POA: Insufficient documentation

## 2013-01-11 DIAGNOSIS — C787 Secondary malignant neoplasm of liver and intrahepatic bile duct: Secondary | ICD-10-CM | POA: Insufficient documentation

## 2013-01-11 LAB — CBC WITH DIFFERENTIAL/PLATELET
Eosinophils Absolute: 0.1 10*3/uL (ref 0.0–0.5)
HCT: 38.5 % (ref 34.8–46.6)
LYMPH%: 39.3 % (ref 14.0–49.7)
MCHC: 34 g/dL (ref 31.5–36.0)
MCV: 92.5 fL (ref 79.5–101.0)
MONO#: 0.4 10*3/uL (ref 0.1–0.9)
MONO%: 5.8 % (ref 0.0–14.0)
NEUT#: 3.5 10*3/uL (ref 1.5–6.5)
NEUT%: 52.1 % (ref 38.4–76.8)
Platelets: 207 10*3/uL (ref 145–400)
WBC: 6.7 10*3/uL (ref 3.9–10.3)

## 2013-01-11 LAB — COMPREHENSIVE METABOLIC PANEL (CC13)
Alkaline Phosphatase: 75 U/L (ref 40–150)
CO2: 27 mEq/L (ref 22–29)
Creatinine: 1 mg/dL (ref 0.6–1.1)
Glucose: 89 mg/dl (ref 70–140)
Total Bilirubin: 1.52 mg/dL — ABNORMAL HIGH (ref 0.20–1.20)

## 2013-01-11 MED ORDER — IOHEXOL 300 MG/ML  SOLN
100.0000 mL | Freq: Once | INTRAMUSCULAR | Status: AC | PRN
  Administered 2013-01-11: 100 mL via INTRAVENOUS

## 2013-01-12 LAB — CEA: CEA: 6.1 ng/mL — ABNORMAL HIGH (ref 0.0–5.0)

## 2013-01-13 ENCOUNTER — Telehealth: Payer: Self-pay | Admitting: *Deleted

## 2013-01-13 NOTE — Telephone Encounter (Signed)
Lm gv appt for labs 01/16/13 @ 2:45pm...td

## 2013-01-16 ENCOUNTER — Ambulatory Visit (HOSPITAL_BASED_OUTPATIENT_CLINIC_OR_DEPARTMENT_OTHER): Payer: Medicaid Other | Admitting: Oncology

## 2013-01-16 ENCOUNTER — Other Ambulatory Visit (HOSPITAL_BASED_OUTPATIENT_CLINIC_OR_DEPARTMENT_OTHER): Payer: Medicaid Other | Admitting: Lab

## 2013-01-16 ENCOUNTER — Encounter: Payer: Self-pay | Admitting: *Deleted

## 2013-01-16 VITALS — BP 152/91 | HR 59 | Temp 98.1°F | Resp 18 | Ht 71.0 in | Wt 235.8 lb

## 2013-01-16 DIAGNOSIS — C787 Secondary malignant neoplasm of liver and intrahepatic bile duct: Secondary | ICD-10-CM

## 2013-01-16 DIAGNOSIS — C189 Malignant neoplasm of colon, unspecified: Secondary | ICD-10-CM

## 2013-01-16 LAB — COMPREHENSIVE METABOLIC PANEL (CC13)
ALT: 11 U/L (ref 0–55)
Albumin: 4 g/dL (ref 3.5–5.0)
CO2: 26 mEq/L (ref 22–29)
Calcium: 9.9 mg/dL (ref 8.4–10.4)
Chloride: 108 mEq/L (ref 98–109)
Potassium: 3.1 mEq/L — ABNORMAL LOW (ref 3.5–5.1)
Sodium: 143 mEq/L (ref 136–145)
Total Protein: 8.2 g/dL (ref 6.4–8.3)

## 2013-01-16 NOTE — Progress Notes (Signed)
Clinical Social Worker was referred by pt navigator after receiving news of recurrence.  CSW met with pt in the exam room to offer support.  Pt stated she was doing "ok", and had a positive outlook on her new treatment plan.  CSW and pt processed her emotions associated with her new diagnosis.  Pt identified family members and friends as means of support.  CSw inform pt of support programs and resources at [facility], and encouraged her to call with needs or concerns.    Tamala Julian, MSW, LCSW Clinical Social Worker [facility] [phone]

## 2013-01-16 NOTE — Progress Notes (Signed)
   [facility]    OFFICE PROGRESS NOTE   INTERVAL HISTORY:   Ms. Reder returns for scheduled followup of colon cancer. She reports feeling well. Good appetite and energy level. No specific complaint.  Objective:  Vital signs in last 24 hours:  Blood pressure 152/91, pulse 59, temperature 98.1 F (36.7 C), temperature source Oral, resp. rate 18, height 5\' 11"  (1.803 m), weight 235 lb 12.8 oz (106.958 kg).    HEENT: Neck without mass Lymphatics: No cervical, supra-clavicular, axillary, or inguinal nodes Resp: Lungs clear bilaterally Cardio: Regular rate and rhythm GI: No hepatomegaly, nontender, no mass Vascular: No leg edema    Portacath/PICC-without erythema  Lab Results:  Lab Results  Component Value Date   WBC 6.7 01/11/2013   HGB 13.1 01/11/2013   HCT 38.5 01/11/2013   MCV 92.5 01/11/2013   PLT 207 01/11/2013   ANC 3.5  CEA 6.1  X-rays: Restaging CTs of the chest, abdomen, and pelvis on 01/11/2013-compared to October of 2013 there has been interval development of bilateral lung nodules consistent with metastatic disease. No new liver lesions. No enlarged abdominal or pelvic lymph nodes. No ascites or peritoneal nodularity  Medications: I have reviewed the patient's current medications.  Assessment/Plan: 1. Metastatic colon cancer (T4, N0, M1) adenocarcinoma of the sigmoid colon status post sigmoid colectomy and partial right hepatectomy 04/11/2012. K-ras wild-type She completed cycle 1 CAPOX beginning 05/25/2012.  She completed cycle 8 CAPOX beginning 10/19/2012.  CEA on 10/19/2012 normal at 0.8. CEA 6.1 on 01/11/2013 Restaging CT on 01/11/2013 consistent with multiple new pulmonary metastases 2. Microcytic anemia, iron deficiency. Improved.  3. Markedly elevated preoperative CEA. The CEA was mildly elevated on 05/16/2012. The CEA was in normal range at 0.5 on 08/17/2012 and 0.8 on 10/19/2012. Elevated at 6.1 on 01/11/2013. 4. Status post Port-A-Cath  placement 03/14/2012. X-ray showed the Port-A-Cath tip to be in the azygos vein. The left-sided Port-A-Cath was removed and a new right Port-A-Cath was placed on 05/23/2012. Port-A-Cath was flushed today. 5. 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  6. Hypertension-her blood pressure has been consistently elevated here. It was recommended she followup with Dr. Virgina Organ.   Disposition:  Ms. Royle appears well. I discussed the restaging CT findings and elevated CEA with her today. She appears to have progressive metastatic colon cancer. We discussed treatment options. No therapy will be curative. The tumor from 2013 was K-ras wild-type based on codon 12 and 13 testing. We will submit extended K-ras testing.  Ms. Paone would like to proceed with systemic therapy now as opposed to waiting for evidence of further progression. I recommend FOLFIRI/Avastin. She is not a candidate for the irinotecan dose escalation study since she has been treated in the metastatic setting.  The plan is to proceed with FOLFIRI/Avastin on 01/18/2013. We reviewed the potential toxicities associated with this regimen including the chance for nausea/vomiting, mucositis, diarrhea, alopecia, and hematologic toxicity. We discussed the hand/foot syndrome and hyperpigmentation associated with 5-fluorouracil. We reviewed the abdominal pain and diarrhea seen with irinotecan. We also discussed the potential toxicities associated with Avastin including the chance of an allergic reaction, hypertension, bleeding, bowel perforation, thromboembolic disease, and proteinuria. She was given reading materials on the above chemotherapy agents.  She will return for an office visit and second cycle of FOLFIRI/Avastin on 02/01/2013.   Thornton Papas, MD  01/16/2013  5:14 PM

## 2013-01-16 NOTE — Progress Notes (Signed)
Request for extended KRAS testing per Dr. Truett Perna on Accession #: [id number].  Spoke with [name] in pathology. Patient informed of disease progression per MD.  SW, [place], notified for support services.  Spent time with patient giving emotional support.  Will continue to follow.

## 2013-01-17 ENCOUNTER — Telehealth: Payer: Self-pay | Admitting: Internal Medicine

## 2013-01-17 ENCOUNTER — Telehealth: Payer: Self-pay | Admitting: Oncology

## 2013-01-17 ENCOUNTER — Telehealth: Payer: Self-pay | Admitting: *Deleted

## 2013-01-17 NOTE — Telephone Encounter (Signed)
Per staff message and POF I have scheduled appts.  JMW  

## 2013-01-17 NOTE — Telephone Encounter (Signed)
I called [name] to touch base with her after reading her latest visit note with oncology showing metastatic colon cancer. There was no answer, I left a voicemail for her to call opc back. Dr. Truett Perna also recommended for her to follow up in [facility] in regards to her BP. Hopefully she will do so. She is a very kind lady.

## 2013-01-17 NOTE — Telephone Encounter (Signed)
S/w the pt and she is aware of her chemo appt on 01/18/2013@1 :00pm. Advised the pt to pick up a new schedule when she comes in tomorrow.

## 2013-01-18 ENCOUNTER — Other Ambulatory Visit: Payer: Self-pay | Admitting: Oncology

## 2013-01-18 ENCOUNTER — Ambulatory Visit: Payer: Medicaid Other | Admitting: Oncology

## 2013-01-18 ENCOUNTER — Ambulatory Visit (HOSPITAL_BASED_OUTPATIENT_CLINIC_OR_DEPARTMENT_OTHER): Payer: Medicaid Other

## 2013-01-18 ENCOUNTER — Other Ambulatory Visit: Payer: Self-pay | Admitting: *Deleted

## 2013-01-18 VITALS — BP 147/87 | HR 64 | Temp 98.4°F

## 2013-01-18 DIAGNOSIS — C189 Malignant neoplasm of colon, unspecified: Secondary | ICD-10-CM

## 2013-01-18 DIAGNOSIS — Z5112 Encounter for antineoplastic immunotherapy: Secondary | ICD-10-CM

## 2013-01-18 DIAGNOSIS — C787 Secondary malignant neoplasm of liver and intrahepatic bile duct: Secondary | ICD-10-CM

## 2013-01-18 DIAGNOSIS — C187 Malignant neoplasm of sigmoid colon: Secondary | ICD-10-CM

## 2013-01-18 DIAGNOSIS — Z5111 Encounter for antineoplastic chemotherapy: Secondary | ICD-10-CM

## 2013-01-18 MED ORDER — SODIUM CHLORIDE 0.9 % IV SOLN
2400.0000 mg/m2 | INTRAVENOUS | Status: DC
  Administered 2013-01-18: 5550 mg via INTRAVENOUS
  Filled 2013-01-18: qty 111

## 2013-01-18 MED ORDER — SODIUM CHLORIDE 0.9 % IJ SOLN
10.0000 mL | INTRAMUSCULAR | Status: DC | PRN
  Filled 2013-01-18: qty 10

## 2013-01-18 MED ORDER — IRINOTECAN HCL CHEMO INJECTION 100 MG/5ML
420.0000 mg | Freq: Once | INTRAVENOUS | Status: AC
  Administered 2013-01-18: 420 mg via INTRAVENOUS
  Filled 2013-01-18: qty 21

## 2013-01-18 MED ORDER — ATROPINE SULFATE 1 MG/ML IJ SOLN: 0.5000 mg | Freq: Once | INTRAMUSCULAR | Status: DC | PRN

## 2013-01-18 MED ORDER — LOPERAMIDE HCL 2 MG PO CAPS: ORAL_CAPSULE | ORAL | Status: DC

## 2013-01-18 MED ORDER — SODIUM CHLORIDE 0.9 % IV SOLN
5.0000 mg/kg | Freq: Once | INTRAVENOUS | Status: AC
  Administered 2013-01-18: 525 mg via INTRAVENOUS
  Filled 2013-01-18: qty 21

## 2013-01-18 MED ORDER — ONDANSETRON 16 MG/50ML IVPB (CHCC)
INTRAVENOUS | Status: AC
  Filled 2013-01-18: qty 16

## 2013-01-18 MED ORDER — FLUOROURACIL CHEMO INJECTION 2.5 GM/50ML
400.0000 mg/m2 | Freq: Once | INTRAVENOUS | Status: AC
  Administered 2013-01-18: 950 mg via INTRAVENOUS
  Filled 2013-01-18: qty 19

## 2013-01-18 MED ORDER — LEUCOVORIN CALCIUM INJECTION 350 MG
405.0000 mg/m2 | Freq: Once | INTRAVENOUS | Status: AC
  Administered 2013-01-18: 940 mg via INTRAVENOUS
  Filled 2013-01-18: qty 47

## 2013-01-18 MED ORDER — DEXAMETHASONE SODIUM PHOSPHATE 20 MG/5ML IJ SOLN
20.0000 mg | Freq: Once | INTRAMUSCULAR | Status: AC
  Administered 2013-01-18: 20 mg via INTRAVENOUS

## 2013-01-18 MED ORDER — ONDANSETRON 16 MG/50ML IVPB (CHCC)
16.0000 mg | Freq: Once | INTRAVENOUS | Status: AC
  Administered 2013-01-18: 16 mg via INTRAVENOUS

## 2013-01-18 MED ORDER — HEPARIN SOD (PORK) LOCK FLUSH 100 UNIT/ML IV SOLN
500.0000 [IU] | Freq: Once | INTRAVENOUS | Status: DC | PRN
  Filled 2013-01-18: qty 5

## 2013-01-18 MED ORDER — DEXAMETHASONE SODIUM PHOSPHATE 20 MG/5ML IJ SOLN
INTRAMUSCULAR | Status: AC
  Filled 2013-01-18: qty 5

## 2013-01-18 MED ORDER — SODIUM CHLORIDE 0.9 % IV SOLN
Freq: Once | INTRAVENOUS | Status: AC
  Administered 2013-01-18: 14:00:00 via INTRAVENOUS

## 2013-01-18 NOTE — Patient Instructions (Addendum)
RETURN FOR PUMP DISCONNECT Friday 9/26 AT   [facility] Discharge Instructions for Patients Receiving Chemotherapy  Today you received the following chemotherapy agents: Avastin, Leucovorin, Irinotecan, 5FU  To help prevent nausea and vomiting after your treatment, we encourage you to take your nausea medication.  Take it as often as prescribed.     If you develop nausea and vomiting that is not controlled by your nausea medication, call the clinic. If it is after clinic hours your family physician or the after hours number for the clinic or go to the Emergency Department.   BELOW ARE SYMPTOMS THAT SHOULD BE REPORTED IMMEDIATELY:  *FEVER GREATER THAN 100.5 F  *CHILLS WITH OR WITHOUT FEVER  NAUSEA AND VOMITING THAT IS NOT CONTROLLED WITH YOUR NAUSEA MEDICATION  *UNUSUAL SHORTNESS OF BREATH  *UNUSUAL BRUISING OR BLEEDING  TENDERNESS IN MOUTH AND THROAT WITH OR WITHOUT PRESENCE OF ULCERS  *URINARY PROBLEMS  *BOWEL PROBLEMS  UNUSUAL RASH Items with * indicate a potential emergency and should be followed up as soon as possible.  One of the nurses will contact you 24 hours after your treatment. Please let the nurse know about any problems that you may have experienced. Feel free to call the clinic you have any questions or concerns. The clinic phone number is [phone].   I have been informed and understand all the instructions given to me. I know to contact the clinic, my physician, or go to the Emergency Department if any problems should occur. I do not have any questions at this time, but understand that I may call the clinic during office hours   should I have any questions or need assistance in obtaining follow up care.    __________________________________________  _____________  __________ Signature of Patient or Authorized Representative            Date                   Time    __________________________________________ Nurse's  Signature   Bevacizumab injection (Avastin) What is this medicine? BEVACIZUMAB (be va SIZ yoo mab) is a chemotherapy drug. It targets a protein found in many cancer cell types, and halts cancer growth. This drug treats many cancers including non-small cell lung cancer, and colon or rectal cancer. It is usually given with other chemotherapy drugs. This medicine may be used for other purposes; ask your health care provider or pharmacist if you have questions. What should I tell my health care provider before I take this medicine? They need to know if you have any of these conditions: -blood clots -heart disease, including heart failure, heart attack, or chest pain (angina) -high blood pressure -infection (especially a virus infection such as chickenpox, cold sores, or herpes) -kidney disease -lung disease -prior chemotherapy with doxorubicin, daunorubicin, epirubicin, or other anthracycline type chemotherapy agents -recent or ongoing radiation therapy -recent surgery -stroke -an unusual or allergic reaction to bevacizumab, hamster proteins, mouse proteins, other medicines, foods, dyes, or preservatives -pregnant or trying to get pregnant -breast-feeding How should I use this medicine? This medicine is for infusion into a vein. It is given by a health care professional in a hospital or clinic setting. Talk to your pediatrician regarding the use of this medicine in children. Special care may be needed. Overdosage: If you think you have taken too much of this medicine contact a poison control center or emergency room at once. NOTE: This medicine is only for you. Do not share this medicine  with others. What if I miss a dose? It is important not to miss your dose. Call your doctor or health care professional if you are unable to keep an appointment. What may interact with this medicine? Interactions are not expected. This list may not describe all possible interactions. Give your health care  provider a list of all the medicines, herbs, non-prescription drugs, or dietary supplements you use. Also tell them if you smoke, drink alcohol, or use illegal drugs. Some items may interact with your medicine. What should I watch for while using this medicine? Your condition will be monitored carefully while you are receiving this medicine. You will need important blood work and urine testing done while you are taking this medicine. During your treatment, let your health care professional know if you have any unusual symptoms, such as difficulty breathing. This medicine may rarely cause 'gastrointestinal perforation' (holes in the stomach, intestines or colon), a serious side effect requiring surgery to repair. This medicine should be started at least 28 days following major surgery and the site of the surgery should be totally healed. Check with your doctor before scheduling dental work or surgery while you are receiving this treatment. Talk to your doctor if you have recently had surgery or if you have a wound that has not healed. Do not become pregnant while taking this medicine. Women should inform their doctor if they wish to become pregnant or think they might be pregnant. There is a potential for serious side effects to an unborn child. Talk to your health care professional or pharmacist for more information. Do not breast-feed an infant while taking this medicine. This medicine has caused ovarian failure in some women. This medicine may interfere with the ability to have a child. You should talk to your doctor or health care professional if you are concerned about your fertility. What side effects may I notice from receiving this medicine? Side effects that you should report to your doctor or health care professional as soon as possible: -allergic reactions like skin rash, itching or hives, swelling of the face, lips, or tongue -signs of infection - fever or chills, cough, sore throat, pain or  trouble passing urine -signs of decreased platelets or bleeding - bruising, pinpoint red spots on the skin, black, tarry stools, nosebleeds, blood in the urine -breathing problems -changes in vision -chest pain -confusion -jaw pain, especially after dental work -mouth sores -seizures -severe abdominal pain -severe headache -sudden numbness or weakness of the face, arm or leg -swelling of legs or ankles -symptoms of a stroke: change in mental awareness, inability to talk or move one side of the body (especially in patients with lung cancer) -trouble passing urine or change in the amount of urine -trouble speaking or understanding -trouble walking, dizziness, loss of balance or coordination Side effects that usually do not require medical attention (report to your doctor or health care professional if they continue or are bothersome): -constipation -diarrhea -dry skin -headache -loss of appetite -nausea, vomiting This list may not describe all possible side effects. Call your doctor for medical advice about side effects. You may report side effects to FDA at 1-800-FDA-1088. Where should I keep my medicine? This drug is given in a hospital or clinic and will not be stored at home. NOTE: This sheet is a summary. It may not cover all possible information. If you have questions about this medicine, talk to your doctor, pharmacist, or health care provider.  2012, Elsevier/[employer] Standard. (03/14/2010 4:25:37 PM)  Irinotecan injection What is this medicine? IRINOTECAN (ir in oh TEE kan ) is a chemotherapy drug. It is used to treat colon and rectal cancer. This medicine may be used for other purposes; ask your health care provider or pharmacist if you have questions. What should I tell my health care provider before I take this medicine? They need to know if you have any of these conditions: -blood disorders -dehydration -diarrhea -infection (especially a virus infection such as  chickenpox, cold sores, or herpes) -liver disease -low blood counts, like low white cell, platelet, or red cell counts -recent or ongoing radiation therapy -an unusual or allergic reaction to irinotecan, sorbitol, other chemotherapy, other medicines, foods, dyes, or preservatives -pregnant or trying to get pregnant -breast-feeding How should I use this medicine? This drug is given as an infusion into a vein. It is administered in a hospital or clinic by a specially trained health care professional. Talk to your pediatrician regarding the use of this medicine in children. Special care may be needed. Overdosage: If you think you have taken too much of this medicine contact a poison control center or emergency room at once. NOTE: This medicine is only for you. Do not share this medicine with others. What if I miss a dose? It is important not to miss your dose. Call your doctor or health care professional if you are unable to keep an appointment. What may interact with this medicine? Do not take this medicine with any of the following medications: -atazanavir -ketoconazole -St. John's Wort This medicine may also interact with the following medications: -dexamethasone -diuretics -laxatives -medicines for seizures like carbamazepine, mephobarbital, phenobarbital, phenytoin, primidone -medicines to increase blood counts like filgrastim, pegfilgrastim, sargramostim -prochlorperazine -vaccines This list may not describe all possible interactions. Give your health care provider a list of all the medicines, herbs, non-prescription drugs, or dietary supplements you use. Also tell them if you smoke, drink alcohol, or use illegal drugs. Some items may interact with your medicine. What should I watch for while using this medicine? Your condition will be monitored carefully while you are receiving this medicine. You will need important blood work done while you are taking this medicine. This drug may  make you feel generally unwell. This is not uncommon, as chemotherapy can affect healthy cells as well as cancer cells. Report any side effects. Continue your course of treatment even though you feel ill unless your doctor tells you to stop. In some cases, you may be given additional medicines to help with side effects. Follow all directions for their use. You may get drowsy or dizzy. Do not drive, use machinery, or do anything that needs mental alertness until you know how this medicine affects you. Do not stand or sit up quickly, especially if you are an older patient. This reduces the risk of dizzy or fainting spells. Call your doctor or health care professional for advice if you get a fever, chills or sore throat, or other symptoms of a cold or flu. Do not treat yourself. This drug decreases your body's ability to fight infections. Try to avoid being around people who are sick. This medicine may increase your risk to bruise or bleed. Call your doctor or health care professional if you notice any unusual bleeding. Be careful brushing and flossing your teeth or using a toothpick because you may get an infection or bleed more easily. If you have any dental work done, tell your dentist you are receiving this medicine. Avoid taking products that  contain aspirin, acetaminophen, ibuprofen, naproxen, or ketoprofen unless instructed by your doctor. These medicines may hide a fever. Do not become pregnant while taking this medicine. Women should inform their doctor if they wish to become pregnant or think they might be pregnant. There is a potential for serious side effects to an unborn child. Talk to your health care professional or pharmacist for more information. Do not breast-feed an infant while taking this medicine. What side effects may I notice from receiving this medicine? Side effects that you should report to your doctor or health care professional as soon as possible: -allergic reactions like skin  rash, itching or hives, swelling of the face, lips, or tongue -low blood counts - this medicine may decrease the number of white blood cells, red blood cells and platelets. You may be at increased risk for infections and bleeding. -signs of infection - fever or chills, cough, sore throat, pain or difficulty passing urine -signs of decreased platelets or bleeding - bruising, pinpoint red spots on the skin, black, tarry stools, blood in the urine -signs of decreased red blood cells - unusually weak or tired, fainting spells, lightheadedness -breathing problems -chest pain -diarrhea -feeling faint or lightheaded, falls -flushing, runny nose, sweating during infusion -mouth sores or pain -pain, swelling, redness or irritation where injected -pain, swelling, warmth in the leg -pain, tingling, numbness in the hands or feet -problems with balance, talking, walking -stomach cramps, pain -trouble passing urine or change in the amount of urine -vomiting as to be unable to hold down drinks or food -yellowing of the eyes or skin Side effects that usually do not require medical attention (report to your doctor or health care professional if they continue or are bothersome): -constipation -hair loss -headache -loss of appetite -nausea, vomiting -stomach upset This list may not describe all possible side effects. Call your doctor for medical advice about side effects. You may report side effects to FDA at 1-800-FDA-1088. Where should I keep my medicine? This drug is given in a hospital or clinic and will not be stored at home. NOTE: This sheet is a summary. It may not cover all possible information. If you have questions about this medicine, talk to your doctor, pharmacist, or health care provider.  2012, Elsevier/Gold Standard. (08/30/2007 4:29:12 PM)   Leucovorin injection What is this medicine? LEUCOVORIN (loo koe VOR in) is used to prevent or treat the harmful effects of some medicines. This  medicine is used to treat anemia caused by a low amount of folic acid in the body. It is also used with 5-fluorouracil (5-FU) to treat colon cancer. This medicine may be used for other purposes; ask your health care provider or pharmacist if you have questions. What should I tell my health care provider before I take this medicine? They need to know if you have any of these conditions: -anemia from low levels of vitamin B-12 in the blood -an unusual or allergic reaction to leucovorin, folic acid, other medicines, foods, dyes, or preservatives -pregnant or trying to get pregnant -breast-feeding How should I use this medicine? This medicine is for injection into a muscle or into a vein. It is given by a health care professional in a hospital or clinic setting. Talk to your pediatrician regarding the use of this medicine in children. Special care may be needed. Overdosage: If you think you have taken too much of this medicine contact a poison control center or emergency room at once. NOTE: This medicine is only for you.  Do not share this medicine with others. What if I miss a dose? This does not apply. What may interact with this medicine? -capecitabine -fluorouracil -phenobarbital -phenytoin -primidone -trimethoprim-sulfamethoxazole This list may not describe all possible interactions. Give your health care provider a list of all the medicines, herbs, non-prescription drugs, or dietary supplements you use. Also tell them if you smoke, drink alcohol, or use illegal drugs. Some items may interact with your medicine. What should I watch for while using this medicine? Your condition will be monitored carefully while you are receiving this medicine. This medicine may increase the side effects of 5-fluorouracil, 5-FU. Tell your doctor or health care professional if you have diarrhea or mouth sores that do not get better or that get worse. What side effects may I notice from receiving this  medicine? Side effects that you should report to your doctor or health care professional as soon as possible: -allergic reactions like skin rash, itching or hives, swelling of the face, lips, or tongue -breathing problems -fever, infection -mouth sores -unusual bleeding or bruising -unusually weak or tired Side effects that usually do not require medical attention (report to your doctor or health care professional if they continue or are bothersome): -constipation or diarrhea -loss of appetite -nausea, vomiting This list may not describe all possible side effects. Call your doctor for medical advice about side effects. You may report side effects to FDA at 1-800-FDA-1088. Where should I keep my medicine? This drug is given in a hospital or clinic and will not be stored at home. NOTE: This sheet is a summary. It may not cover all possible information. If you have questions about this medicine, talk to your doctor, pharmacist, or health care provider.  2012, [employer]vier/Gold Standard. (10/18/2007 4:50:29 PM)  Fluorouracil, 5-FU injection What is this medicine? FLUOROURACIL, 5-FU (flure oh YOOR a sil) is a chemotherapy drug. It slows the growth of cancer cells. This medicine is used to treat many types of cancer like breast cancer, colon or rectal cancer, pancreatic cancer, and stomach cancer. This medicine may be used for other purposes; ask your health care provider or pharmacist if you have questions. What should I tell my health care provider before I take this medicine? They need to know if you have any of these conditions: -blood disorders -dihydropyrimidine dehydrogenase (DPD) deficiency -infection (especially a virus infection such as chickenpox, cold sores, or herpes) -kidney disease -liver disease -malnourished, poor nutrition -recent or ongoing radiation therapy -an unusual or allergic reaction to fluorouracil, other chemotherapy, other medicines, foods, dyes, or  preservatives -pregnant or trying to get pregnant -breast-feeding How should I use this medicine? This drug is given as an infusion or injection into a vein. It is administered in a hospital or clinic by a specially trained health care professional. Talk to your pediatrician regarding the use of this medicine in children. Special care may be needed. Overdosage: If you think you have taken too much of this medicine contact a poison control center or emergency room at once. NOTE: This medicine is only for you. Do not share this medicine with others. What if I miss a dose? It is important not to miss your dose. Call your doctor or health care professional if you are unable to keep an appointment. What may interact with this medicine? -allopurinol -cimetidine -dapsone -digoxin -hydroxyurea -leucovorin -levamisole -medicines for seizures like ethotoin, fosphenytoin, phenytoin -medicines to increase blood counts like filgrastim, pegfilgrastim, sargramostim -medicines that treat or prevent blood clots like warfarin, enoxaparin,  and dalteparin -methotrexate -metronidazole -pyrimethamine -some other chemotherapy drugs like busulfan, cisplatin, estramustine, vinblastine -trimethoprim -trimetrexate -vaccines Talk to your doctor or health care professional before taking any of these medicines: -acetaminophen -aspirin -ibuprofen -ketoprofen -naproxen This list may not describe all possible interactions. Give your health care provider a list of all the medicines, herbs, non-prescription drugs, or dietary supplements you use. Also tell them if you smoke, drink alcohol, or use illegal drugs. Some items may interact with your medicine. What should I watch for while using this medicine? Visit your doctor for checks on your progress. This drug may make you feel generally unwell. This is not uncommon, as chemotherapy can affect healthy cells as well as cancer cells. Report any side effects. Continue  your course of treatment even though you feel ill unless your doctor tells you to stop. In some cases, you may be given additional medicines to help with side effects. Follow all directions for their use. Call your doctor or health care professional for advice if you get a fever, chills or sore throat, or other symptoms of a cold or flu. Do not treat yourself. This drug decreases your body's ability to fight infections. Try to avoid being around people who are sick. This medicine may increase your risk to bruise or bleed. Call your doctor or health care professional if you notice any unusual bleeding. Be careful brushing and flossing your teeth or using a toothpick because you may get an infection or bleed more easily. If you have any dental work done, tell your dentist you are receiving this medicine. Avoid taking products that contain aspirin, acetaminophen, ibuprofen, naproxen, or ketoprofen unless instructed by your doctor. These medicines may hide a fever. Do not become pregnant while taking this medicine. Women should inform their doctor if they wish to become pregnant or think they might be pregnant. There is a potential for serious side effects to an unborn child. Talk to your health care professional or pharmacist for more information. Do not breast-feed an infant while taking this medicine. Men should inform their doctor if they wish to father a child. This medicine may lower sperm counts. Do not treat diarrhea with over the counter products. Contact your doctor if you have diarrhea that lasts more than 2 days or if it is severe and watery. This medicine can make you more sensitive to the sun. Keep out of the sun. If you cannot avoid being in the sun, wear protective clothing and use sunscreen. Do not use sun lamps or tanning beds/booths. What side effects may I notice from receiving this medicine? Side effects that you should report to your doctor or health care professional as soon as  possible: -allergic reactions like skin rash, itching or hives, swelling of the face, lips, or tongue -low blood counts - this medicine may decrease the number of white blood cells, red blood cells and platelets. You may be at increased risk for infections and bleeding. -signs of infection - fever or chills, cough, sore throat, pain or difficulty passing urine -signs of decreased platelets or bleeding - bruising, pinpoint red spots on the skin, black, tarry stools, blood in the urine -signs of decreased red blood cells - unusually weak or tired, fainting spells, lightheadedness -breathing problems -changes in vision -chest pain -mouth sores -nausea and vomiting -pain, swelling, redness at site where injected -pain, tingling, numbness in the hands or feet -redness, swelling, or sores on hands or feet -stomach pain -unusual bleeding Side effects that usually do  not require medical attention (report to your doctor or health care professional if they continue or are bothersome): -changes in finger or toe nails -diarrhea -dry or itchy skin -hair loss -headache -loss of appetite -sensitivity of eyes to the light -stomach upset -unusually teary eyes This list may not describe all possible side effects. Call your doctor for medical advice about side effects. You may report side effects to FDA at 1-800-FDA-1088. Where should I keep my medicine? This drug is given in a hospital or clinic and will not be stored at home. NOTE: This sheet is a summary. It may not cover all possible information. If you have questions about this medicine, talk to your doctor, pharmacist, or health care provider.  2012, [employer] Standard. (08/17/2007 1:53:16 PM)

## 2013-01-18 NOTE — Progress Notes (Signed)
Urine Protein 30 

## 2013-01-19 ENCOUNTER — Telehealth: Payer: Self-pay

## 2013-01-19 NOTE — Telephone Encounter (Signed)
Spoke with Ms. Rumbaugh and she states that she has had no symptoms of nausea/vomiting or diarrhea.  She is doing well.  She is drinking water.  Encouraged 8 oz every 2 hours while awake.   Confirmed appointment for tomorrow at 1600.  She know to call [phone] if she has any issues that develop.

## 2013-01-19 NOTE — Telephone Encounter (Signed)
    Lorri Frederick, RN - New treatment regimen More Detail >>      New treatment regimen    Lorri Frederick, RN      Sent: Wed January 18, 2013  4:49 PM    To: [facility] Triage Nurse [facility]     Flags: Call patient    Due: Thu January 19, 2013  6:00 PM       [name]    MRN: [id number] DOB: 12/26/1955     Pt Home: [phone]        Entered: [phone]               Message    Folfiri, Avastin  01-18-13

## 2013-01-20 ENCOUNTER — Ambulatory Visit (HOSPITAL_BASED_OUTPATIENT_CLINIC_OR_DEPARTMENT_OTHER): Payer: Medicaid Other

## 2013-01-20 VITALS — BP 156/88 | HR 70 | Temp 96.7°F

## 2013-01-20 DIAGNOSIS — C787 Secondary malignant neoplasm of liver and intrahepatic bile duct: Secondary | ICD-10-CM

## 2013-01-20 DIAGNOSIS — C189 Malignant neoplasm of colon, unspecified: Secondary | ICD-10-CM

## 2013-01-20 DIAGNOSIS — C187 Malignant neoplasm of sigmoid colon: Secondary | ICD-10-CM

## 2013-01-20 MED ORDER — SODIUM CHLORIDE 0.9 % IJ SOLN
10.0000 mL | INTRAMUSCULAR | Status: DC | PRN
  Administered 2013-01-20: 10 mL
  Filled 2013-01-20: qty 10

## 2013-01-20 MED ORDER — HEPARIN SOD (PORK) LOCK FLUSH 100 UNIT/ML IV SOLN
500.0000 [IU] | Freq: Once | INTRAVENOUS | Status: AC | PRN
  Administered 2013-01-20: 500 [IU]
  Filled 2013-01-20: qty 5

## 2013-01-24 ENCOUNTER — Encounter (INDEPENDENT_AMBULATORY_CARE_PROVIDER_SITE_OTHER): Payer: Self-pay

## 2013-01-25 ENCOUNTER — Encounter: Payer: Self-pay | Admitting: *Deleted

## 2013-01-25 NOTE — Progress Notes (Signed)
[employer] testing results received and placed in MD lab inbox for review.

## 2013-01-29 ENCOUNTER — Other Ambulatory Visit: Payer: Self-pay | Admitting: Oncology

## 2013-02-01 ENCOUNTER — Other Ambulatory Visit (HOSPITAL_BASED_OUTPATIENT_CLINIC_OR_DEPARTMENT_OTHER): Payer: Medicaid Other | Admitting: Lab

## 2013-02-01 ENCOUNTER — Telehealth: Payer: Self-pay | Admitting: *Deleted

## 2013-02-01 ENCOUNTER — Ambulatory Visit (HOSPITAL_BASED_OUTPATIENT_CLINIC_OR_DEPARTMENT_OTHER): Payer: Medicaid Other | Admitting: Oncology

## 2013-02-01 ENCOUNTER — Telehealth: Payer: Self-pay | Admitting: Oncology

## 2013-02-01 ENCOUNTER — Ambulatory Visit (HOSPITAL_BASED_OUTPATIENT_CLINIC_OR_DEPARTMENT_OTHER): Payer: Medicaid Other

## 2013-02-01 VITALS — BP 142/87 | HR 65

## 2013-02-01 VITALS — BP 164/99 | HR 83 | Temp 97.0°F | Resp 20 | Ht 71.0 in | Wt 234.6 lb

## 2013-02-01 DIAGNOSIS — C189 Malignant neoplasm of colon, unspecified: Secondary | ICD-10-CM

## 2013-02-01 DIAGNOSIS — I82B19 Acute embolism and thrombosis of unspecified subclavian vein: Secondary | ICD-10-CM

## 2013-02-01 DIAGNOSIS — C787 Secondary malignant neoplasm of liver and intrahepatic bile duct: Secondary | ICD-10-CM

## 2013-02-01 DIAGNOSIS — C187 Malignant neoplasm of sigmoid colon: Secondary | ICD-10-CM

## 2013-02-01 DIAGNOSIS — Z5112 Encounter for antineoplastic immunotherapy: Secondary | ICD-10-CM

## 2013-02-01 DIAGNOSIS — C7800 Secondary malignant neoplasm of unspecified lung: Secondary | ICD-10-CM

## 2013-02-01 DIAGNOSIS — I10 Essential (primary) hypertension: Secondary | ICD-10-CM

## 2013-02-01 DIAGNOSIS — D509 Iron deficiency anemia, unspecified: Secondary | ICD-10-CM

## 2013-02-01 DIAGNOSIS — Z5111 Encounter for antineoplastic chemotherapy: Secondary | ICD-10-CM

## 2013-02-01 LAB — CBC WITH DIFFERENTIAL/PLATELET
BASO%: 1.1 % (ref 0.0–2.0)
EOS%: 5.5 % (ref 0.0–7.0)
Eosinophils Absolute: 0.3 10*3/uL (ref 0.0–0.5)
HCT: 34 % — ABNORMAL LOW (ref 34.8–46.6)
MCHC: 33.2 g/dL (ref 31.5–36.0)
MONO#: 0.3 10*3/uL (ref 0.1–0.9)
NEUT%: 44.3 % (ref 38.4–76.8)
RBC: 3.75 10*6/uL (ref 3.70–5.45)
RDW: 13.1 % (ref 11.2–14.5)
WBC: 4.8 10*3/uL (ref 3.9–10.3)
lymph#: 2.1 10*3/uL (ref 0.9–3.3)
nRBC: 0 % (ref 0–0)

## 2013-02-01 LAB — COMPREHENSIVE METABOLIC PANEL (CC13)
ALT: 13 U/L (ref 0–55)
AST: 18 U/L (ref 5–34)
Albumin: 4 g/dL (ref 3.5–5.0)
Alkaline Phosphatase: 74 U/L (ref 40–150)
BUN: 10.9 mg/dL (ref 7.0–26.0)
Calcium: 9.9 mg/dL (ref 8.4–10.4)
Chloride: 106 mEq/L (ref 98–109)
Creatinine: 0.9 mg/dL (ref 0.6–1.1)
Glucose: 100 mg/dl (ref 70–140)
Potassium: 3.7 mEq/L (ref 3.5–5.1)
Sodium: 142 mEq/L (ref 136–145)

## 2013-02-01 LAB — UA PROTEIN, DIPSTICK - CHCC: Protein, ur: <30 mg/dL

## 2013-02-01 MED ORDER — SODIUM CHLORIDE 0.9 % IV SOLN
5.0000 mg/kg | Freq: Once | INTRAVENOUS | Status: AC
  Administered 2013-02-01: 525 mg via INTRAVENOUS
  Filled 2013-02-01: qty 21

## 2013-02-01 MED ORDER — ATROPINE SULFATE 1 MG/ML IJ SOLN
INTRAMUSCULAR | Status: AC
  Filled 2013-02-01: qty 1

## 2013-02-01 MED ORDER — ONDANSETRON 16 MG/50ML IVPB (CHCC)
16.0000 mg | Freq: Once | INTRAVENOUS | Status: AC
  Administered 2013-02-01: 16 mg via INTRAVENOUS

## 2013-02-01 MED ORDER — ATROPINE SULFATE 1 MG/ML IJ SOLN
0.5000 mg | Freq: Once | INTRAMUSCULAR | Status: AC | PRN
  Administered 2013-02-01: 0.5 mg via INTRAVENOUS

## 2013-02-01 MED ORDER — SODIUM CHLORIDE 0.9 % IV SOLN
Freq: Once | INTRAVENOUS | Status: AC
  Administered 2013-02-01: 10:00:00 via INTRAVENOUS

## 2013-02-01 MED ORDER — SODIUM CHLORIDE 0.9 % IV SOLN: Freq: Once | INTRAVENOUS | Status: DC

## 2013-02-01 MED ORDER — FLUOROURACIL CHEMO INJECTION 2.5 GM/50ML
400.0000 mg/m2 | Freq: Once | INTRAVENOUS | Status: AC
  Administered 2013-02-01: 950 mg via INTRAVENOUS
  Filled 2013-02-01: qty 19

## 2013-02-01 MED ORDER — ONDANSETRON 16 MG/50ML IVPB (CHCC)
INTRAVENOUS | Status: AC
  Filled 2013-02-01: qty 16

## 2013-02-01 MED ORDER — SODIUM CHLORIDE 0.9 % IV SOLN
2400.0000 mg/m2 | INTRAVENOUS | Status: DC
  Administered 2013-02-01: 5550 mg via INTRAVENOUS
  Filled 2013-02-01: qty 111

## 2013-02-01 MED ORDER — IRINOTECAN HCL CHEMO INJECTION 100 MG/5ML
181.0000 mg/m2 | Freq: Once | INTRAVENOUS | Status: AC
  Administered 2013-02-01: 420 mg via INTRAVENOUS
  Filled 2013-02-01: qty 21

## 2013-02-01 MED ORDER — DEXAMETHASONE SODIUM PHOSPHATE 20 MG/5ML IJ SOLN
INTRAMUSCULAR | Status: AC
  Filled 2013-02-01: qty 5

## 2013-02-01 MED ORDER — LEUCOVORIN CALCIUM INJECTION 350 MG
405.0000 mg/m2 | Freq: Once | INTRAVENOUS | Status: AC
  Administered 2013-02-01: 940 mg via INTRAVENOUS
  Filled 2013-02-01: qty 47

## 2013-02-01 MED ORDER — DEXAMETHASONE SODIUM PHOSPHATE 20 MG/5ML IJ SOLN
20.0000 mg | Freq: Once | INTRAMUSCULAR | Status: AC
  Administered 2013-02-01: 20 mg via INTRAVENOUS

## 2013-02-01 NOTE — Patient Instructions (Signed)
[facility] Discharge Instructions for Patients Receiving Chemotherapy  Today you received the following chemotherapy agents AVASTIN, IRINOTECAN, LEUCOVORIN, 5FU AND 5FU PUMP  To help prevent nausea and vomiting after your treatment, we encourage you to take your nausea medication ABOUT 4 PM IF NEEDED   If you develop nausea and vomiting that is not controlled by your nausea medication, call the clinic.   BELOW ARE SYMPTOMS THAT SHOULD BE REPORTED IMMEDIATELY:  *FEVER GREATER THAN 100.5 F  *CHILLS WITH OR WITHOUT FEVER  NAUSEA AND VOMITING THAT IS NOT CONTROLLED WITH YOUR NAUSEA MEDICATION  *UNUSUAL SHORTNESS OF BREATH  *UNUSUAL BRUISING OR BLEEDING  TENDERNESS IN MOUTH AND THROAT WITH OR WITHOUT PRESENCE OF ULCERS  *URINARY PROBLEMS  *BOWEL PROBLEMS  UNUSUAL RASH Items with * indicate a potential emergency and should be followed up as soon as possible.  Feel free to call the clinic you have any questions or concerns. The clinic phone number is [phone].

## 2013-02-01 NOTE — Telephone Encounter (Signed)
appts made calendar and avs to pt email mw for tx sch shh

## 2013-02-01 NOTE — Progress Notes (Signed)
   [facility]    OFFICE PROGRESS NOTE   INTERVAL HISTORY:   [name] returns for scheduled followup of metastatic colon cancer.she completed a first cycle of FOLFIRI/Avastin on 01/18/2013.no nausea, mouth sores, or hand/foot pain following chemotherapy. She had one episode of diarrhea. Good appetite. No complaint.  Objective:  Vital signs in last 24 hours:  Blood pressure 164/99, pulse 83, temperature 97 F (36.1 C), temperature source Oral, resp. rate 20, height 5\' 11"  (1.803 m), weight 234 lb 9.6 oz (106.414 kg).    HEENT: no thrush or ulcers Resp: lungs clear bilaterally Cardio: regular rate and rhythm GI: no hepatomegaly, nontender Vascular: no leg edema    Portacath/PICC-without erythema  Lab Results:  Lab Results  Component Value Date   WBC 4.8 02/01/2013   HGB 11.3* 02/01/2013   HCT 34.0* 02/01/2013   MCV 90.7 02/01/2013   PLT 216 02/01/2013  ANC 2.1    Medications: I have reviewed the patient's current medications.  Assessment/Plan: 1. Metastatic colon cancer (T4, N0, M1) adenocarcinoma of the sigmoid colon status post sigmoid colectomy and partial right hepatectomy 04/11/2012. K-ras wild-type on the initial codon 12 and 13 testing.K-ras Q61L mutation identified on extended testing. She completed cycle 1 CAPOX beginning 05/25/2012.  She completed cycle 8 CAPOX beginning 10/19/2012.  CEA on 10/19/2012 normal at 0.8.  CEA 6.1 on 01/11/2013  Restaging CT on 01/11/2013 consistent with multiple new pulmonary metastases Cycle 1 of FOLFIRI/Avastin 01/18/2013 2. Microcytic anemia, iron deficiency. Improved.  3. Markedly elevated preoperative CEA. The CEA was mildly elevated on 05/16/2012. The CEA was in normal range at 0.5 on 08/17/2012 and 0.8 on 10/19/2012. Elevated at 6.1 on 01/11/2013. 4. Status post Port-A-Cath placement 03/14/2012. X-ray showed the Port-A-Cath tip to be in the azygos vein. The left-sided Port-A-Cath was removed and a new right  Port-A-Cath was placed on 05/23/2012. Port-A-Cath was flushed today. 5. 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        6.   Hypertension    Disposition:  [name] tolerated the first cycle of FOLFIRI/Avastin without significant acute toxicity. She will complete cycle 2 today. [name] will return for an office visit and chemotherapy in 2 weeks. We will check a CEA with cycle 4.   Thornton Papas, MD  02/01/2013  2:48 PM

## 2013-02-01 NOTE — Telephone Encounter (Signed)
Per staff message and POF I have scheduled appts.  JMW  

## 2013-02-03 ENCOUNTER — Ambulatory Visit (HOSPITAL_BASED_OUTPATIENT_CLINIC_OR_DEPARTMENT_OTHER): Payer: Medicaid Other

## 2013-02-03 ENCOUNTER — Other Ambulatory Visit: Payer: Self-pay | Admitting: Certified Registered Nurse Anesthetist

## 2013-02-03 VITALS — BP 141/89 | HR 64 | Temp 97.7°F

## 2013-02-03 DIAGNOSIS — C787 Secondary malignant neoplasm of liver and intrahepatic bile duct: Secondary | ICD-10-CM

## 2013-02-03 DIAGNOSIS — C189 Malignant neoplasm of colon, unspecified: Secondary | ICD-10-CM

## 2013-02-03 DIAGNOSIS — C187 Malignant neoplasm of sigmoid colon: Secondary | ICD-10-CM

## 2013-02-03 MED ORDER — HEPARIN SOD (PORK) LOCK FLUSH 100 UNIT/ML IV SOLN
500.0000 [IU] | Freq: Once | INTRAVENOUS | Status: AC | PRN
  Administered 2013-02-03: 500 [IU]
  Filled 2013-02-03: qty 5

## 2013-02-03 MED ORDER — SODIUM CHLORIDE 0.9 % IJ SOLN
10.0000 mL | INTRAMUSCULAR | Status: DC | PRN
  Administered 2013-02-03: 10 mL
  Filled 2013-02-03: qty 10

## 2013-02-11 ENCOUNTER — Other Ambulatory Visit: Payer: Self-pay | Admitting: Oncology

## 2013-02-15 ENCOUNTER — Telehealth: Payer: Self-pay | Admitting: Oncology

## 2013-02-15 ENCOUNTER — Ambulatory Visit (HOSPITAL_BASED_OUTPATIENT_CLINIC_OR_DEPARTMENT_OTHER): Payer: Medicaid Other

## 2013-02-15 ENCOUNTER — Telehealth: Payer: Self-pay | Admitting: *Deleted

## 2013-02-15 ENCOUNTER — Ambulatory Visit (HOSPITAL_BASED_OUTPATIENT_CLINIC_OR_DEPARTMENT_OTHER): Payer: Medicaid Other | Admitting: Nurse Practitioner

## 2013-02-15 ENCOUNTER — Other Ambulatory Visit (HOSPITAL_BASED_OUTPATIENT_CLINIC_OR_DEPARTMENT_OTHER): Payer: Medicaid Other | Admitting: Lab

## 2013-02-15 VITALS — BP 152/94 | HR 87 | Resp 18

## 2013-02-15 VITALS — BP 152/96 | HR 71 | Temp 97.0°F | Resp 20 | Ht 71.0 in | Wt 236.8 lb

## 2013-02-15 DIAGNOSIS — C7800 Secondary malignant neoplasm of unspecified lung: Secondary | ICD-10-CM

## 2013-02-15 DIAGNOSIS — Z5112 Encounter for antineoplastic immunotherapy: Secondary | ICD-10-CM

## 2013-02-15 DIAGNOSIS — C189 Malignant neoplasm of colon, unspecified: Secondary | ICD-10-CM

## 2013-02-15 DIAGNOSIS — C787 Secondary malignant neoplasm of liver and intrahepatic bile duct: Secondary | ICD-10-CM

## 2013-02-15 DIAGNOSIS — C187 Malignant neoplasm of sigmoid colon: Secondary | ICD-10-CM

## 2013-02-15 DIAGNOSIS — I10 Essential (primary) hypertension: Secondary | ICD-10-CM

## 2013-02-15 DIAGNOSIS — D509 Iron deficiency anemia, unspecified: Secondary | ICD-10-CM

## 2013-02-15 DIAGNOSIS — Z7901 Long term (current) use of anticoagulants: Secondary | ICD-10-CM

## 2013-02-15 DIAGNOSIS — Z86718 Personal history of other venous thrombosis and embolism: Secondary | ICD-10-CM

## 2013-02-15 DIAGNOSIS — Z5111 Encounter for antineoplastic chemotherapy: Secondary | ICD-10-CM

## 2013-02-15 LAB — COMPREHENSIVE METABOLIC PANEL
ALT: 15 U/L (ref 0–35)
AST: 18 U/L (ref 0–37)
Albumin: 4 g/dL (ref 3.5–5.2)
Alkaline Phosphatase: 73 U/L (ref 39–117)
CO2: 24 mEq/L (ref 19–32)
Creatinine, Ser: 0.82 mg/dL (ref 0.50–1.10)
Potassium: 3.4 mEq/L — ABNORMAL LOW (ref 3.5–5.3)
Sodium: 137 mEq/L (ref 135–145)
Total Bilirubin: 0.3 mg/dL (ref 0.3–1.2)
Total Protein: 7.7 g/dL (ref 6.0–8.3)

## 2013-02-15 LAB — CBC WITH DIFFERENTIAL/PLATELET
BASO%: 0.6 % (ref 0.0–2.0)
Basophils Absolute: 0 10*3/uL (ref 0.0–0.1)
EOS%: 3.9 % (ref 0.0–7.0)
HCT: 36.2 % (ref 34.8–46.6)
HGB: 12.1 g/dL (ref 11.6–15.9)
LYMPH%: 40.3 % (ref 14.0–49.7)
MCH: 30 pg (ref 25.1–34.0)
MCHC: 33.4 g/dL (ref 31.5–36.0)
NEUT#: 2.5 10*3/uL (ref 1.5–6.5)
RDW: 13.3 % (ref 11.2–14.5)
lymph#: 2.1 10*3/uL (ref 0.9–3.3)

## 2013-02-15 LAB — UA PROTEIN, DIPSTICK - CHCC: Protein, ur: NEGATIVE mg/dL

## 2013-02-15 MED ORDER — ATROPINE SULFATE 1 MG/ML IJ SOLN
0.5000 mg | Freq: Once | INTRAMUSCULAR | Status: AC | PRN
  Administered 2013-02-15: 13:00:00 via INTRAVENOUS

## 2013-02-15 MED ORDER — DEXAMETHASONE SODIUM PHOSPHATE 20 MG/5ML IJ SOLN
INTRAMUSCULAR | Status: AC
  Filled 2013-02-15: qty 5

## 2013-02-15 MED ORDER — SODIUM CHLORIDE 0.9 % IV SOLN
Freq: Once | INTRAVENOUS | Status: AC
  Administered 2013-02-15: 12:00:00 via INTRAVENOUS

## 2013-02-15 MED ORDER — ONDANSETRON 16 MG/50ML IVPB (CHCC)
INTRAVENOUS | Status: AC
  Filled 2013-02-15: qty 16

## 2013-02-15 MED ORDER — DEXAMETHASONE SODIUM PHOSPHATE 20 MG/5ML IJ SOLN
20.0000 mg | Freq: Once | INTRAMUSCULAR | Status: AC
  Administered 2013-02-15: 20 mg via INTRAVENOUS

## 2013-02-15 MED ORDER — SODIUM CHLORIDE 0.9 % IV SOLN
5.0000 mg/kg | Freq: Once | INTRAVENOUS | Status: AC
  Administered 2013-02-15: 525 mg via INTRAVENOUS
  Filled 2013-02-15: qty 21

## 2013-02-15 MED ORDER — ONDANSETRON 16 MG/50ML IVPB (CHCC)
16.0000 mg | Freq: Once | INTRAVENOUS | Status: AC
  Administered 2013-02-15: 16 mg via INTRAVENOUS

## 2013-02-15 MED ORDER — SODIUM CHLORIDE 0.9 % IV SOLN
2400.0000 mg/m2 | INTRAVENOUS | Status: DC
  Administered 2013-02-15: 5550 mg via INTRAVENOUS
  Filled 2013-02-15: qty 111

## 2013-02-15 MED ORDER — ATROPINE SULFATE 1 MG/ML IJ SOLN
INTRAMUSCULAR | Status: AC
  Filled 2013-02-15: qty 1

## 2013-02-15 MED ORDER — FLUOROURACIL CHEMO INJECTION 2.5 GM/50ML
400.0000 mg/m2 | Freq: Once | INTRAVENOUS | Status: AC
  Administered 2013-02-15: 950 mg via INTRAVENOUS
  Filled 2013-02-15: qty 19

## 2013-02-15 MED ORDER — LEUCOVORIN CALCIUM INJECTION 350 MG
400.0000 mg/m2 | Freq: Once | INTRAVENOUS | Status: AC
  Administered 2013-02-15: 928 mg via INTRAVENOUS
  Filled 2013-02-15: qty 46.4

## 2013-02-15 MED ORDER — IRINOTECAN HCL CHEMO INJECTION 100 MG/5ML
180.0000 mg/m2 | Freq: Once | INTRAVENOUS | Status: AC
  Administered 2013-02-15: 418 mg via INTRAVENOUS
  Filled 2013-02-15: qty 20.9

## 2013-02-15 NOTE — Patient Instructions (Signed)
[facility] Discharge Instructions for Patients Receiving Chemotherapy  Today you received the following chemotherapy agents Leucorvin, Avastin, Camptosar and 5FU.  To help prevent nausea and vomiting after your treatment, we encourage you to take your nausea medication.  If you develop nausea and vomiting that is not controlled by your nausea medication, call the clinic.   BELOW ARE SYMPTOMS THAT SHOULD BE REPORTED IMMEDIATELY:  *FEVER GREATER THAN 100.5 F  *CHILLS WITH OR WITHOUT FEVER  NAUSEA AND VOMITING THAT IS NOT CONTROLLED WITH YOUR NAUSEA MEDICATION  *UNUSUAL SHORTNESS OF BREATH  *UNUSUAL BRUISING OR BLEEDING  TENDERNESS IN MOUTH AND THROAT WITH OR WITHOUT PRESENCE OF ULCERS  *URINARY PROBLEMS  *BOWEL PROBLEMS  UNUSUAL RASH Items with * indicate a potential emergency and should be followed up as soon as possible.  Feel free to call the clinic you have any questions or concerns. The clinic phone number is [phone].

## 2013-02-15 NOTE — Telephone Encounter (Signed)
gv and printed appt sched and avs for pt for OCT adn NOV...emaield MW to add tx.

## 2013-02-15 NOTE — Progress Notes (Signed)
OFFICE PROGRESS NOTE  Interval history:  Ms. Kinnick returns for followup of metastatic colon cancer. She completed cycle 2 FOLFIRI/Avastin on 02/01/2013. She denies nausea/vomiting. No mouth sores. She has noted some darkening of the tongue. She denies diarrhea. No hand or foot pain or redness. She denies bleeding. No shortness of breath or chest pain. No leg swelling or calf pain.   Objective: Blood pressure 152/96, pulse 71, temperature 97 F (36.1 C), temperature source Oral, resp. rate 20, height 5\' 11"  (1.803 m), weight 236 lb 12.8 oz (107.412 kg).  Oropharynx is without thrush or ulceration. Lungs are clear. Regular cardiac rhythm. Port-A-Cath site is without erythema. Abdomen is soft and nontender. No hepatomegaly. Extremities are without edema.  Lab Results: Lab Results  Component Value Date   WBC 5.1 02/15/2013   HGB 12.1 02/15/2013   HCT 36.2 02/15/2013   MCV 89.6 02/15/2013   PLT 197 02/15/2013    Chemistry:    Chemistry      Component Value Date/Time   NA 142 02/01/2013 0824   NA 138 04/15/2012 0525   K 3.7 02/01/2013 0824   K 4.3 04/15/2012 0525   CL 105 10/19/2012 0928   CL 104 04/15/2012 0525   CO2 26 02/01/2013 0824   CO2 27 04/15/2012 0525   BUN 10.9 02/01/2013 0824   BUN 7 04/15/2012 0525   CREATININE 0.9 02/01/2013 0824   CREATININE 0.82 04/15/2012 0525   CREATININE 0.89 03/02/2012 1524      Component Value Date/Time   CALCIUM 9.9 02/01/2013 0824   CALCIUM 8.9 04/15/2012 0525   ALKPHOS 74 02/01/2013 0824   ALKPHOS 69 04/14/2012 0555   AST 18 02/01/2013 0824   AST 44* 04/14/2012 0555   ALT 13 02/01/2013 0824   ALT 72* 04/14/2012 0555   BILITOT 0.47 02/01/2013 0824   BILITOT 0.4 04/14/2012 0555       Studies/Results: No results found.  Medications: I have reviewed the patient's current medications.  Assessment/Plan:  1. Metastatic colon cancer (T4, N0, M1) adenocarcinoma of the sigmoid colon status post sigmoid colectomy and partial right hepatectomy  04/11/2012. K-ras wild-type on the initial codon 12 and 13 testing. K-ras Q61L mutation identified on extended testing. She completed cycle 1 CAPOX beginning 05/25/2012.  She completed cycle 8 CAPOX beginning 10/19/2012.  CEA on 10/19/2012 normal at 0.8.  CEA 6.1 on 01/11/2013  Restaging CT on 01/11/2013 consistent with multiple new pulmonary metastases  Cycle 1 of FOLFIRI/Avastin 01/18/2013; cycle 2 02/01/2013. 2. Microcytic anemia, iron deficiency. Improved.  3. Markedly elevated preoperative CEA. The CEA was mildly elevated on 05/16/2012. The CEA was in normal range at 0.5 on 08/17/2012 and 0.8 on 10/19/2012. Elevated at 6.1 on 01/11/2013. 4. Status post Port-A-Cath placement 03/14/2012. X-ray showed the Port-A-Cath tip to be in the azygos vein. The left-sided Port-A-Cath was removed and a new right Port-A-Cath was placed on 05/23/2012. 5. 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 6. Hypertension.   Disposition-Ms. Bedgood appears stable. She has completed 2 cycles of FOLFIRI/Avastin. She appears to be tolerating the chemotherapy well. Plan to proceed with cycle 3 today as scheduled. Repeat CEA will be obtained prior to cycle 4. She will return for a followup visit and cycle 4 in 2 weeks. She will contact the office in the interim with any problems.    Lonna Cobb ANP/GNP-BC

## 2013-02-15 NOTE — Telephone Encounter (Signed)
Per staff message and POF I have scheduled appts.  JMW  

## 2013-02-17 ENCOUNTER — Telehealth: Payer: Self-pay | Admitting: *Deleted

## 2013-02-17 ENCOUNTER — Ambulatory Visit (HOSPITAL_BASED_OUTPATIENT_CLINIC_OR_DEPARTMENT_OTHER): Payer: Medicaid Other

## 2013-02-17 VITALS — BP 163/97 | HR 60 | Temp 97.6°F

## 2013-02-17 DIAGNOSIS — C189 Malignant neoplasm of colon, unspecified: Secondary | ICD-10-CM

## 2013-02-17 DIAGNOSIS — C787 Secondary malignant neoplasm of liver and intrahepatic bile duct: Secondary | ICD-10-CM

## 2013-02-17 DIAGNOSIS — C187 Malignant neoplasm of sigmoid colon: Secondary | ICD-10-CM

## 2013-02-17 MED ORDER — HEPARIN SOD (PORK) LOCK FLUSH 100 UNIT/ML IV SOLN
500.0000 [IU] | Freq: Once | INTRAVENOUS | Status: AC | PRN
  Administered 2013-02-17: 500 [IU]
  Filled 2013-02-17: qty 5

## 2013-02-17 MED ORDER — SODIUM CHLORIDE 0.9 % IJ SOLN
10.0000 mL | INTRAMUSCULAR | Status: DC | PRN
  Administered 2013-02-17: 10 mL
  Filled 2013-02-17: qty 10

## 2013-02-17 NOTE — Telephone Encounter (Signed)
Call from pt concerned about elevated BP. She denies any headaches/ vision changes or other signs of elevated BP. Instructed her to monitor BP at home, bring results to next office visit. She voiced understanding. Reviewed with Dr. Truett Perna: Will evaluate BP at next visit and make adjustments if necessary.

## 2013-03-01 ENCOUNTER — Other Ambulatory Visit (HOSPITAL_BASED_OUTPATIENT_CLINIC_OR_DEPARTMENT_OTHER): Payer: Medicaid Other | Admitting: Lab

## 2013-03-01 ENCOUNTER — Ambulatory Visit (HOSPITAL_BASED_OUTPATIENT_CLINIC_OR_DEPARTMENT_OTHER): Payer: Medicaid Other

## 2013-03-01 ENCOUNTER — Telehealth: Payer: Self-pay | Admitting: Oncology

## 2013-03-01 ENCOUNTER — Telehealth: Payer: Self-pay | Admitting: Medical Oncology

## 2013-03-01 ENCOUNTER — Ambulatory Visit (HOSPITAL_BASED_OUTPATIENT_CLINIC_OR_DEPARTMENT_OTHER): Payer: Medicaid Other | Admitting: Oncology

## 2013-03-01 ENCOUNTER — Other Ambulatory Visit: Payer: Self-pay | Admitting: Medical Oncology

## 2013-03-01 ENCOUNTER — Other Ambulatory Visit: Payer: Self-pay | Admitting: *Deleted

## 2013-03-01 VITALS — BP 218/100 | HR 64 | Temp 98.2°F | Resp 20 | Ht 71.0 in | Wt 239.9 lb

## 2013-03-01 VITALS — BP 184/85 | HR 52

## 2013-03-01 DIAGNOSIS — C787 Secondary malignant neoplasm of liver and intrahepatic bile duct: Secondary | ICD-10-CM

## 2013-03-01 DIAGNOSIS — C189 Malignant neoplasm of colon, unspecified: Secondary | ICD-10-CM

## 2013-03-01 DIAGNOSIS — C187 Malignant neoplasm of sigmoid colon: Secondary | ICD-10-CM

## 2013-03-01 DIAGNOSIS — I82B19 Acute embolism and thrombosis of unspecified subclavian vein: Secondary | ICD-10-CM

## 2013-03-01 DIAGNOSIS — I10 Essential (primary) hypertension: Secondary | ICD-10-CM

## 2013-03-01 DIAGNOSIS — Z5111 Encounter for antineoplastic chemotherapy: Secondary | ICD-10-CM

## 2013-03-01 DIAGNOSIS — Z5112 Encounter for antineoplastic immunotherapy: Secondary | ICD-10-CM

## 2013-03-01 DIAGNOSIS — D509 Iron deficiency anemia, unspecified: Secondary | ICD-10-CM

## 2013-03-01 LAB — COMPREHENSIVE METABOLIC PANEL (CC13)
ALT: 22 U/L (ref 0–55)
Albumin: 4 g/dL (ref 3.5–5.0)
Alkaline Phosphatase: 77 U/L (ref 40–150)
Anion Gap: 12 mEq/L — ABNORMAL HIGH (ref 3–11)
BUN: 13.9 mg/dL (ref 7.0–26.0)
CO2: 23 mEq/L (ref 22–29)
Calcium: 10.3 mg/dL (ref 8.4–10.4)
Glucose: 93 mg/dl (ref 70–140)
Potassium: 3.9 mEq/L (ref 3.5–5.1)
Sodium: 142 mEq/L (ref 136–145)
Total Protein: 8.2 g/dL (ref 6.4–8.3)

## 2013-03-01 LAB — CBC WITH DIFFERENTIAL/PLATELET
BASO%: 1.3 % (ref 0.0–2.0)
Basophils Absolute: 0.1 10*3/uL (ref 0.0–0.1)
EOS%: 2.9 % (ref 0.0–7.0)
LYMPH%: 35.6 % (ref 14.0–49.7)
MCH: 30.2 pg (ref 25.1–34.0)
MCHC: 33.2 g/dL (ref 31.5–36.0)
MCV: 90.9 fL (ref 79.5–101.0)
MONO%: 5.7 % (ref 0.0–14.0)
Platelets: 208 10*3/uL (ref 145–400)
RBC: 3.75 10*6/uL (ref 3.70–5.45)
WBC: 4.9 10*3/uL (ref 3.9–10.3)

## 2013-03-01 MED ORDER — SODIUM CHLORIDE 0.9 % IV SOLN
5.0000 mg/kg | Freq: Once | INTRAVENOUS | Status: AC
  Administered 2013-03-01: 525 mg via INTRAVENOUS
  Filled 2013-03-01: qty 21

## 2013-03-01 MED ORDER — ATROPINE SULFATE 1 MG/ML IJ SOLN
0.5000 mg | Freq: Once | INTRAMUSCULAR | Status: AC | PRN
  Administered 2013-03-01: 0.5 mg via INTRAVENOUS

## 2013-03-01 MED ORDER — SODIUM CHLORIDE 0.9 % IV SOLN
Freq: Once | INTRAVENOUS | Status: AC
  Administered 2013-03-01: 11:00:00 via INTRAVENOUS

## 2013-03-01 MED ORDER — PALONOSETRON HCL INJECTION 0.25 MG/5ML
INTRAVENOUS | Status: AC
  Filled 2013-03-01: qty 5

## 2013-03-01 MED ORDER — SODIUM CHLORIDE 0.9 % IV SOLN: Freq: Once | INTRAVENOUS | Status: DC

## 2013-03-01 MED ORDER — FLUOROURACIL CHEMO INJECTION 2.5 GM/50ML
400.0000 mg/m2 | Freq: Once | INTRAVENOUS | Status: AC
  Administered 2013-03-01: 950 mg via INTRAVENOUS
  Filled 2013-03-01: qty 19

## 2013-03-01 MED ORDER — SODIUM CHLORIDE 0.9 % IV SOLN
2400.0000 mg/m2 | INTRAVENOUS | Status: DC
  Administered 2013-03-01: 5550 mg via INTRAVENOUS
  Filled 2013-03-01: qty 111

## 2013-03-01 MED ORDER — PALONOSETRON HCL INJECTION 0.25 MG/5ML
0.2500 mg | Freq: Once | INTRAVENOUS | Status: AC
  Administered 2013-03-01: 0.25 mg via INTRAVENOUS

## 2013-03-01 MED ORDER — DEXAMETHASONE SODIUM PHOSPHATE 20 MG/5ML IJ SOLN
20.0000 mg | Freq: Once | INTRAMUSCULAR | Status: AC
  Administered 2013-03-01: 20 mg via INTRAVENOUS

## 2013-03-01 MED ORDER — DEXAMETHASONE SODIUM PHOSPHATE 20 MG/5ML IJ SOLN
INTRAMUSCULAR | Status: AC
  Filled 2013-03-01: qty 5

## 2013-03-01 MED ORDER — IRINOTECAN HCL CHEMO INJECTION 100 MG/5ML
181.2500 mg/m2 | Freq: Once | INTRAVENOUS | Status: AC
  Administered 2013-03-01: 420 mg via INTRAVENOUS
  Filled 2013-03-01: qty 21

## 2013-03-01 MED ORDER — LEUCOVORIN CALCIUM INJECTION 350 MG
405.0000 mg/m2 | Freq: Once | INTRAVENOUS | Status: AC
  Administered 2013-03-01: 940 mg via INTRAVENOUS
  Filled 2013-03-01: qty 47

## 2013-03-01 MED ORDER — SODIUM CHLORIDE 0.9 % IJ SOLN
10.0000 mL | INTRAMUSCULAR | Status: DC | PRN
  Filled 2013-03-01: qty 10

## 2013-03-01 MED ORDER — LISINOPRIL 5 MG PO TABS: 5.0000 mg | ORAL_TABLET | Freq: Every day | ORAL | Status: DC

## 2013-03-01 MED ORDER — ATROPINE SULFATE 1 MG/ML IJ SOLN
INTRAMUSCULAR | Status: AC
  Filled 2013-03-01: qty 1

## 2013-03-01 NOTE — Progress Notes (Signed)
Met with patient to assess for needs.  She was upset because [employer] did no show to pick her up at her scheduled time.  SW, Cammy Copa, was notified re: above and she will communicate the feedback to [facility]. Patient requested coupons from dietician for [employer].  Dietician sent Boost and coupons to the patient.  Patient denies other needs and stated everything else was going smoothly.  Will continue to follow as needed.

## 2013-03-01 NOTE — Patient Instructions (Addendum)
[facility] Discharge Instructions for Patients Receiving Chemotherapy  Today you received the following chemotherapy agents 5FU.Leucovorin, irinotecan  Your blood pressure was elevated today and Dr Truett Perna called in a new Blood pressure medicine to start taking today. When you come back on Friday for your pump disconnect ask the nurse to recheck your blood pressure and notify Dr Truett Perna of the BP reading.  To help prevent nausea and vomiting after your treatment, we encourage you to take your nausea medication .   If you develop nausea and vomiting that is not controlled by your nausea medication, call the clinic.   BELOW ARE SYMPTOMS THAT SHOULD BE REPORTED IMMEDIATELY:  *FEVER GREATER THAN 100.5 F  *CHILLS WITH OR WITHOUT FEVER  NAUSEA AND VOMITING THAT IS NOT CONTROLLED WITH YOUR NAUSEA MEDICATION  *UNUSUAL SHORTNESS OF BREATH  *UNUSUAL BRUISING OR BLEEDING  TENDERNESS IN MOUTH AND THROAT WITH OR WITHOUT PRESENCE OF ULCERS  *URINARY PROBLEMS  *BOWEL PROBLEMS  UNUSUAL RASH Items with * indicate a potential emergency and should be followed up as soon as possible.  Feel free to call the clinic you have any questions or concerns. The clinic phone number is [phone].

## 2013-03-01 NOTE — Telephone Encounter (Signed)
gv and printed appt sched and avs for pt for NOV and DEc...sed add tx for DEC

## 2013-03-01 NOTE — Progress Notes (Signed)
Per Dr Truett Perna it is okay to given Avastin today with BP 166/101. He ordered lisinopril 5 mg daily for pt.

## 2013-03-01 NOTE — Progress Notes (Signed)
   [facility]    OFFICE PROGRESS NOTE   INTERVAL HISTORY:   She completed another cycle of FOLFIRI/Avastin on 02/15/2013. Mild nausea on day 3. Mild diarrhea. Good appetite. No bleeding. No mouth sores. She has developed darkening of the hands and mouth.  Objective:  Vital signs in last 24 hours:  Blood pressure 218/100, pulse 64, temperature 98.2 F (36.8 C), temperature source Oral, resp. rate 20, height 5\' 11"  (1.803 m), weight 239 lb 14.4 oz (108.818 kg).    HEENT: no thrush or ulcers Resp: lungs clear bilaterally Cardio: regular rate and rhythm GI: no hepatomegaly, nontender Vascular: no leg edema  Skin:hyperpigmentation at the hands   Portacath/PICC-without erythema  Lab Results:  Lab Results  Component Value Date   WBC 4.9 03/01/2013   HGB 11.3* 03/01/2013   HCT 34.1* 03/01/2013   MCV 90.9 03/01/2013   PLT 208 03/01/2013  ANC 2.7    Medications: I have reviewed the patient's current medications.  Assessment/Plan: 1. Metastatic colon cancer (T4, N0, M1) adenocarcinoma of the sigmoid colon status post sigmoid colectomy and partial right hepatectomy 04/11/2012. K-ras wild-type on the initial codon 12 and 13 testing. K-ras Q61L mutation identified on extended testing. She completed cycle 1 CAPOX beginning 05/25/2012.  She completed cycle 8 CAPOX beginning 10/19/2012.  CEA on 10/19/2012 normal at 0.8.  CEA 6.1 on 01/11/2013  Restaging CT on 01/11/2013 consistent with multiple new pulmonary metastases  Cycle 1 of FOLFIRI/Avastin 01/18/2013; cycle 2 02/01/2013,cycle 3 02/16/2013 2. Microcytic anemia, iron deficiency. Improved.  3. Markedly elevated preoperative CEA. The CEA was mildly elevated on 05/16/2012. The CEA was in normal range at 0.5 on 08/17/2012 and 0.8 on 10/19/2012. Elevated at 6.1 on 01/11/2013. 4. Status post Port-A-Cath placement 03/14/2012. X-ray showed the Port-A-Cath tip to be in the azygos vein. The left-sided Port-A-Cath was removed  and a new right Port-A-Cath was placed on 05/23/2012. 5. 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 6. Hypertension-persistent on amlodipine, we will add lisinopril today   Disposition:  Ms. Nation appears stable. She has completed 3 cycles of FOLFIRI/Avastin. She is tolerating the chemotherapy well. She has persistent hypertension, likely in part a complication of Avastin. We added lisinopril today. We will check the blood pressure was returns for the pump disconnect on 03/03/2013. We checked a CEA level today. Ms. Pagliarulo will return for an office visit and chemotherapy in 2 weeks.   Thornton Papas, MD  03/01/2013  2:37 PM

## 2013-03-01 NOTE — Telephone Encounter (Signed)
Called in lisinopril .

## 2013-03-02 LAB — CEA: CEA: 5 ng/mL (ref 0.0–5.0)

## 2013-03-03 ENCOUNTER — Ambulatory Visit (HOSPITAL_BASED_OUTPATIENT_CLINIC_OR_DEPARTMENT_OTHER): Payer: Medicaid Other

## 2013-03-03 VITALS — BP 170/93 | HR 56 | Temp 97.5°F

## 2013-03-03 DIAGNOSIS — Z452 Encounter for adjustment and management of vascular access device: Secondary | ICD-10-CM

## 2013-03-03 DIAGNOSIS — C787 Secondary malignant neoplasm of liver and intrahepatic bile duct: Secondary | ICD-10-CM

## 2013-03-03 DIAGNOSIS — C189 Malignant neoplasm of colon, unspecified: Secondary | ICD-10-CM

## 2013-03-03 DIAGNOSIS — C187 Malignant neoplasm of sigmoid colon: Secondary | ICD-10-CM

## 2013-03-03 MED ORDER — SODIUM CHLORIDE 0.9 % IJ SOLN
10.0000 mL | INTRAMUSCULAR | Status: DC | PRN
  Administered 2013-03-03: 10 mL
  Filled 2013-03-03: qty 10

## 2013-03-03 MED ORDER — HEPARIN SOD (PORK) LOCK FLUSH 100 UNIT/ML IV SOLN
500.0000 [IU] | Freq: Once | INTRAVENOUS | Status: AC | PRN
  Administered 2013-03-03: 500 [IU]
  Filled 2013-03-03: qty 5

## 2013-03-09 ENCOUNTER — Encounter: Payer: Self-pay | Admitting: *Deleted

## 2013-03-09 NOTE — Progress Notes (Signed)
[facility] Clinical Social Work  Clinical Social Work was referred by patient for assessment of psychosocial needs due to transportation concerns.  Clinical Social Worker contacted patient at home to offer support and assess for needs.  Pt upset as she reports [employer] missed picking her up the other day and now states her medicaid is inactive. Pt contacted her medicaid worker, who states her medicaid is active, but not showing up due to not being in [employer] yet. CSW provided supportive listening and problem solving. CSW contacted [facility] [place] on behalf of pt. and also left a vm for [employer]. Pt sent [employer] application and bus passes to assist as well. Pt very appreciative and CSW plans to follow up once return call from [facility] received. CSW plans to meet with Pt after her appointment on 11/19 to finish [employer] application.     Clinical Social Work interventions: Doctor, hospital for [employer] Problem solving Resource and referral   Doreen Salvage, LCSW Clinical Social Worker [facility] for Patient & Family Support [facility] Wednesday, Thursday and Friday Phone: [phone] Fax: [phone]

## 2013-03-12 ENCOUNTER — Other Ambulatory Visit: Payer: Self-pay | Admitting: Oncology

## 2013-03-15 ENCOUNTER — Ambulatory Visit (HOSPITAL_BASED_OUTPATIENT_CLINIC_OR_DEPARTMENT_OTHER): Payer: Medicaid Other | Admitting: Nurse Practitioner

## 2013-03-15 ENCOUNTER — Ambulatory Visit (HOSPITAL_BASED_OUTPATIENT_CLINIC_OR_DEPARTMENT_OTHER): Payer: Medicaid Other

## 2013-03-15 ENCOUNTER — Telehealth: Payer: Self-pay | Admitting: Oncology

## 2013-03-15 ENCOUNTER — Telehealth: Payer: Self-pay | Admitting: *Deleted

## 2013-03-15 ENCOUNTER — Other Ambulatory Visit (HOSPITAL_BASED_OUTPATIENT_CLINIC_OR_DEPARTMENT_OTHER): Payer: Medicaid Other | Admitting: Lab

## 2013-03-15 VITALS — BP 181/87 | HR 71 | Temp 97.3°F | Resp 18 | Ht 71.0 in | Wt 236.1 lb

## 2013-03-15 VITALS — BP 173/98

## 2013-03-15 DIAGNOSIS — R970 Elevated carcinoembryonic antigen [CEA]: Secondary | ICD-10-CM

## 2013-03-15 DIAGNOSIS — C189 Malignant neoplasm of colon, unspecified: Secondary | ICD-10-CM

## 2013-03-15 DIAGNOSIS — I82B19 Acute embolism and thrombosis of unspecified subclavian vein: Secondary | ICD-10-CM

## 2013-03-15 DIAGNOSIS — Z5112 Encounter for antineoplastic immunotherapy: Secondary | ICD-10-CM

## 2013-03-15 DIAGNOSIS — I10 Essential (primary) hypertension: Secondary | ICD-10-CM

## 2013-03-15 DIAGNOSIS — C787 Secondary malignant neoplasm of liver and intrahepatic bile duct: Secondary | ICD-10-CM

## 2013-03-15 DIAGNOSIS — C187 Malignant neoplasm of sigmoid colon: Secondary | ICD-10-CM

## 2013-03-15 DIAGNOSIS — Z5111 Encounter for antineoplastic chemotherapy: Secondary | ICD-10-CM

## 2013-03-15 DIAGNOSIS — I82A19 Acute embolism and thrombosis of unspecified axillary vein: Secondary | ICD-10-CM

## 2013-03-15 LAB — COMPREHENSIVE METABOLIC PANEL (CC13)
ALT: 11 U/L (ref 0–55)
AST: 15 U/L (ref 5–34)
Albumin: 4 g/dL (ref 3.5–5.0)
Alkaline Phosphatase: 67 U/L (ref 40–150)
Chloride: 108 mEq/L (ref 98–109)
Potassium: 3.5 mEq/L (ref 3.5–5.1)
Sodium: 142 mEq/L (ref 136–145)
Total Bilirubin: 0.56 mg/dL (ref 0.20–1.20)
Total Protein: 7.7 g/dL (ref 6.4–8.3)

## 2013-03-15 LAB — CBC WITH DIFFERENTIAL/PLATELET
BASO%: 1.3 % (ref 0.0–2.0)
EOS%: 1.9 % (ref 0.0–7.0)
HGB: 11.6 g/dL (ref 11.6–15.9)
LYMPH%: 27.2 % (ref 14.0–49.7)
MCH: 29.8 pg (ref 25.1–34.0)
MCHC: 33.1 g/dL (ref 31.5–36.0)
MCV: 89.9 fL (ref 79.5–101.0)
MONO%: 5.5 % (ref 0.0–14.0)
Platelets: 175 10*3/uL (ref 145–400)
RBC: 3.9 10*6/uL (ref 3.70–5.45)
RDW: 15.4 % — ABNORMAL HIGH (ref 11.2–14.5)

## 2013-03-15 LAB — UA PROTEIN, DIPSTICK - CHCC: Protein, ur: <30 mg/dL

## 2013-03-15 MED ORDER — SODIUM CHLORIDE 0.9 % IV SOLN
2400.0000 mg/m2 | INTRAVENOUS | Status: DC
  Administered 2013-03-15: 5550 mg via INTRAVENOUS
  Filled 2013-03-15: qty 111

## 2013-03-15 MED ORDER — ATROPINE SULFATE 1 MG/ML IJ SOLN
INTRAMUSCULAR | Status: AC
  Filled 2013-03-15: qty 1

## 2013-03-15 MED ORDER — SODIUM CHLORIDE 0.9 % IV SOLN
5.0000 mg/kg | Freq: Once | INTRAVENOUS | Status: AC
  Administered 2013-03-15: 525 mg via INTRAVENOUS
  Filled 2013-03-15: qty 21

## 2013-03-15 MED ORDER — IRINOTECAN HCL CHEMO INJECTION 100 MG/5ML
180.0000 mg/m2 | Freq: Once | INTRAVENOUS | Status: AC
  Administered 2013-03-15: 420 mg via INTRAVENOUS
  Filled 2013-03-15: qty 21

## 2013-03-15 MED ORDER — SODIUM CHLORIDE 0.9 % IV SOLN
Freq: Once | INTRAVENOUS | Status: AC
  Administered 2013-03-15: 11:00:00 via INTRAVENOUS

## 2013-03-15 MED ORDER — PALONOSETRON HCL INJECTION 0.25 MG/5ML
0.2500 mg | Freq: Once | INTRAVENOUS | Status: AC
  Administered 2013-03-15: 0.25 mg via INTRAVENOUS

## 2013-03-15 MED ORDER — DEXAMETHASONE SODIUM PHOSPHATE 20 MG/5ML IJ SOLN
INTRAMUSCULAR | Status: AC
  Filled 2013-03-15: qty 5

## 2013-03-15 MED ORDER — DEXAMETHASONE SODIUM PHOSPHATE 20 MG/5ML IJ SOLN
20.0000 mg | Freq: Once | INTRAMUSCULAR | Status: AC
  Administered 2013-03-15: 20 mg via INTRAVENOUS

## 2013-03-15 MED ORDER — LEUCOVORIN CALCIUM INJECTION 350 MG
950.0000 mg | Freq: Once | INTRAVENOUS | Status: AC
  Administered 2013-03-15: 950 mg via INTRAVENOUS
  Filled 2013-03-15: qty 47.5

## 2013-03-15 MED ORDER — FLUOROURACIL CHEMO INJECTION 2.5 GM/50ML
400.0000 mg/m2 | Freq: Once | INTRAVENOUS | Status: AC
  Administered 2013-03-15: 950 mg via INTRAVENOUS
  Filled 2013-03-15: qty 19

## 2013-03-15 MED ORDER — ATROPINE SULFATE 1 MG/ML IJ SOLN
0.5000 mg | Freq: Once | INTRAMUSCULAR | Status: AC | PRN
  Administered 2013-03-15: 0.5 mg via INTRAVENOUS

## 2013-03-15 MED ORDER — PALONOSETRON HCL INJECTION 0.25 MG/5ML
INTRAVENOUS | Status: AC
  Filled 2013-03-15: qty 5

## 2013-03-15 NOTE — Telephone Encounter (Signed)
Per staff message and POF I have scheduled appts.  JMW  

## 2013-03-15 NOTE — Patient Instructions (Signed)
[facility] Discharge Instructions for Patients Receiving Chemotherapy  Today you received the following chemotherapy agents: Avastin, Irinotecan, Leucovorin, 5FU  To help prevent nausea and vomiting after your treatment, we encourage you to take your nausea medication as prescribed.   If you develop nausea and vomiting that is not controlled by your nausea medication, call the clinic.   BELOW ARE SYMPTOMS THAT SHOULD BE REPORTED IMMEDIATELY:  *FEVER GREATER THAN 100.5 F  *CHILLS WITH OR WITHOUT FEVER  NAUSEA AND VOMITING THAT IS NOT CONTROLLED WITH YOUR NAUSEA MEDICATION  *UNUSUAL SHORTNESS OF BREATH  *UNUSUAL BRUISING OR BLEEDING  TENDERNESS IN MOUTH AND THROAT WITH OR WITHOUT PRESENCE OF ULCERS  *URINARY PROBLEMS  *BOWEL PROBLEMS  UNUSUAL RASH Items with * indicate a potential emergency and should be followed up as soon as possible.  Feel free to call the clinic you have any questions or concerns. The clinic phone number is [phone].    

## 2013-03-15 NOTE — Telephone Encounter (Signed)
Gave pt appt for lab,md ,chemo and Ct for November and december 2014

## 2013-03-15 NOTE — Progress Notes (Signed)
At time of discharge from infusion room, patient reports that she wishes she were able to work. She states she cries at night because her diagnosis and treatment render her unable to work and she misses working. This RN offered resources, such as chaplain, but patient declined services at this time.

## 2013-03-15 NOTE — Progress Notes (Signed)
OFFICE PROGRESS NOTE  Interval history:  [name] returns for followup of metastatic colon cancer. She completed cycle 4 FOLFIRI/Avastin on 03/01/2013. She denies nausea/vomiting. No mouth sores. No diarrhea. She denies bleeding. No shortness of breath or chest pain. She denies leg swelling and calf pain. She has a good appetite. She continues to note darkening of the hands.   Objective: Blood pressure 181/87, pulse 71, temperature 97.3 F (36.3 C), temperature source Oral, resp. rate 18, height 5\' 11"  (1.803 m), weight 236 lb 1.6 oz (107.094 kg).  No thrush or ulcerations. Lungs are clear. No wheezes or rales. Regular cardiac rhythm. Port-A-Cath site is without erythema. Abdomen is soft and nontender. No hepatomegaly. Extremities without edema. Hyperpigmentation over the hands.  Lab Results: Lab Results  Component Value Date   WBC 6.9 03/15/2013   HGB 11.6 03/15/2013   HCT 35.1 03/15/2013   MCV 89.9 03/15/2013   PLT 175 03/15/2013    Chemistry:    Chemistry      Component Value Date/Time   NA 142 03/01/2013 0935   NA 137 02/15/2013 1200   K 3.9 03/01/2013 0935   K 3.4* 02/15/2013 1200   CL 103 02/15/2013 1200   CL 105 10/19/2012 0928   CO2 23 03/01/2013 0935   CO2 24 02/15/2013 1200   BUN 13.9 03/01/2013 0935   BUN 10 02/15/2013 1200   CREATININE 0.9 03/01/2013 0935   CREATININE 0.82 02/15/2013 1200   CREATININE 0.89 03/02/2012 1524      Component Value Date/Time   CALCIUM 10.3 03/01/2013 0935   CALCIUM 9.9 02/15/2013 1200   ALKPHOS 77 03/01/2013 0935   ALKPHOS 73 02/15/2013 1200   AST 26 03/01/2013 0935   AST 18 02/15/2013 1200   ALT 22 03/01/2013 0935   ALT 15 02/15/2013 1200   BILITOT 0.78 03/01/2013 0935   BILITOT 0.3 02/15/2013 1200       Studies/Results: No results found.  Medications: I have reviewed the patient's current medications.  Assessment/Plan:  1. Metastatic colon cancer (T4, N0, M1) adenocarcinoma of the sigmoid colon status post sigmoid colectomy  and partial right hepatectomy 04/11/2012. K-ras wild-type on the initial codon 12 and 13 testing. K-ras Q61L mutation identified on extended testing. She completed cycle 1 CAPOX beginning 05/25/2012.  She completed cycle 8 CAPOX beginning 10/19/2012.  CEA on 10/19/2012 normal at 0.8.  CEA 6.1 on 01/11/2013  Restaging CT on 01/11/2013 consistent with multiple new pulmonary metastases  Cycle 1 of FOLFIRI/Avastin 01/18/2013; cycle 2 02/01/2013,cycle 3 02/16/2013. CEA 5.0 on 03/01/2013.  Cycle 4 FOLFIRI/Avastin 03/01/2013. 2. Microcytic anemia, iron deficiency. Improved.  3. Markedly elevated preoperative CEA. The CEA was mildly elevated on 05/16/2012. The CEA was in normal range at 0.5 on 08/17/2012 and 0.8 on 10/19/2012. Elevated at 6.1 on 01/11/2013. CEA 5.0 on 03/01/2013. 4. Status post Port-A-Cath placement 03/14/2012. X-ray showed the Port-A-Cath tip to be in the azygos vein. The left-sided Port-A-Cath was removed and a new right Port-A-Cath was placed on 05/23/2012. 5. 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 6. Hypertension-persistent on amlodipine. Lisinopril 5 mg daily added on 03/01/2013.  Disposition-she appears stable. Plan to proceed with cycle 5 FOLFIRI/Avastin today as scheduled. We will refer her for a restaging CT chest prior to cycle 6.  Blood pressure is better but remains elevated. She was instructed to increase lisinopril to 10 mg daily.  We will see her in followup on 03/29/2013. She will contact the office  in the interim with any problems.  Plan reviewed with Dr. Truett Perna.  [name] ANP/GNP-BC

## 2013-03-16 ENCOUNTER — Encounter: Payer: Self-pay | Admitting: *Deleted

## 2013-03-16 NOTE — Progress Notes (Signed)
[facility] Clinical Social Work  Clinical Social Work was able to arrange [employer] services for Pt. Pt left vm to make her aware that she was approved for this resource and to contact [employer] to begin using their services.    Doreen Salvage, LCSW Clinical Social Worker [facility] for Patient & Family Support [facility] Wednesday, Thursday and Friday Phone: [phone] Fax: [phone]

## 2013-03-17 ENCOUNTER — Encounter (INDEPENDENT_AMBULATORY_CARE_PROVIDER_SITE_OTHER): Payer: Self-pay

## 2013-03-17 ENCOUNTER — Ambulatory Visit (HOSPITAL_BASED_OUTPATIENT_CLINIC_OR_DEPARTMENT_OTHER): Payer: Medicaid Other

## 2013-03-17 VITALS — BP 181/105 | HR 52 | Temp 97.7°F | Resp 18

## 2013-03-17 DIAGNOSIS — Z452 Encounter for adjustment and management of vascular access device: Secondary | ICD-10-CM

## 2013-03-17 DIAGNOSIS — C189 Malignant neoplasm of colon, unspecified: Secondary | ICD-10-CM

## 2013-03-17 DIAGNOSIS — C187 Malignant neoplasm of sigmoid colon: Secondary | ICD-10-CM

## 2013-03-17 MED ORDER — SODIUM CHLORIDE 0.9 % IJ SOLN
10.0000 mL | INTRAMUSCULAR | Status: DC | PRN
  Administered 2013-03-17: 10 mL
  Filled 2013-03-17: qty 10

## 2013-03-17 MED ORDER — HEPARIN SOD (PORK) LOCK FLUSH 100 UNIT/ML IV SOLN
500.0000 [IU] | Freq: Once | INTRAVENOUS | Status: AC | PRN
  Administered 2013-03-17: 500 [IU]
  Filled 2013-03-17: qty 5

## 2013-03-17 NOTE — Patient Instructions (Signed)

## 2013-03-24 ENCOUNTER — Other Ambulatory Visit: Payer: Self-pay | Admitting: Oncology

## 2013-03-29 ENCOUNTER — Ambulatory Visit (HOSPITAL_BASED_OUTPATIENT_CLINIC_OR_DEPARTMENT_OTHER): Payer: Medicaid Other | Admitting: Nurse Practitioner

## 2013-03-29 ENCOUNTER — Other Ambulatory Visit (HOSPITAL_BASED_OUTPATIENT_CLINIC_OR_DEPARTMENT_OTHER): Payer: Medicaid Other | Admitting: Lab

## 2013-03-29 ENCOUNTER — Telehealth: Payer: Self-pay | Admitting: Oncology

## 2013-03-29 ENCOUNTER — Ambulatory Visit (HOSPITAL_BASED_OUTPATIENT_CLINIC_OR_DEPARTMENT_OTHER): Payer: Medicaid Other

## 2013-03-29 ENCOUNTER — Ambulatory Visit (HOSPITAL_COMMUNITY)
Admission: RE | Admit: 2013-03-29 | Discharge: 2013-03-29 | Disposition: A | Discharge status: Home / Self Care | Payer: Medicaid Other | Source: Ambulatory Visit | Attending: Nurse Practitioner | Admitting: Nurse Practitioner

## 2013-03-29 VITALS — BP 171/94 | HR 78 | Temp 98.4°F | Resp 20 | Ht 71.0 in | Wt 235.4 lb

## 2013-03-29 DIAGNOSIS — R110 Nausea: Secondary | ICD-10-CM

## 2013-03-29 DIAGNOSIS — C189 Malignant neoplasm of colon, unspecified: Secondary | ICD-10-CM

## 2013-03-29 DIAGNOSIS — C787 Secondary malignant neoplasm of liver and intrahepatic bile duct: Secondary | ICD-10-CM

## 2013-03-29 DIAGNOSIS — C7800 Secondary malignant neoplasm of unspecified lung: Secondary | ICD-10-CM | POA: Insufficient documentation

## 2013-03-29 DIAGNOSIS — Z9221 Personal history of antineoplastic chemotherapy: Secondary | ICD-10-CM | POA: Insufficient documentation

## 2013-03-29 DIAGNOSIS — C187 Malignant neoplasm of sigmoid colon: Secondary | ICD-10-CM

## 2013-03-29 DIAGNOSIS — I10 Essential (primary) hypertension: Secondary | ICD-10-CM

## 2013-03-29 DIAGNOSIS — D509 Iron deficiency anemia, unspecified: Secondary | ICD-10-CM

## 2013-03-29 DIAGNOSIS — Z5111 Encounter for antineoplastic chemotherapy: Secondary | ICD-10-CM

## 2013-03-29 DIAGNOSIS — I82B19 Acute embolism and thrombosis of unspecified subclavian vein: Secondary | ICD-10-CM

## 2013-03-29 DIAGNOSIS — Z5112 Encounter for antineoplastic immunotherapy: Secondary | ICD-10-CM

## 2013-03-29 DIAGNOSIS — I82409 Acute embolism and thrombosis of unspecified deep veins of unspecified lower extremity: Secondary | ICD-10-CM

## 2013-03-29 LAB — CBC WITH DIFFERENTIAL/PLATELET
BASO%: 1 % (ref 0.0–2.0)
EOS%: 2.7 % (ref 0.0–7.0)
Eosinophils Absolute: 0.1 10*3/uL (ref 0.0–0.5)
LYMPH%: 39.9 % (ref 14.0–49.7)
MCHC: 32.8 g/dL (ref 31.5–36.0)
MCV: 89 fL (ref 79.5–101.0)
MONO%: 6.5 % (ref 0.0–14.0)
NEUT#: 2.6 10*3/uL (ref 1.5–6.5)
NEUT%: 49.9 % (ref 38.4–76.8)
Platelets: 208 10*3/uL (ref 145–400)
RBC: 3.91 10*6/uL (ref 3.70–5.45)
WBC: 5.1 10*3/uL (ref 3.9–10.3)

## 2013-03-29 LAB — COMPREHENSIVE METABOLIC PANEL (CC13)
AST: 16 U/L (ref 5–34)
Alkaline Phosphatase: 75 U/L (ref 40–150)
BUN: 13.9 mg/dL (ref 7.0–26.0)
Creatinine: 0.9 mg/dL (ref 0.6–1.1)
Glucose: 97 mg/dl (ref 70–140)
Sodium: 141 mEq/L (ref 136–145)
Total Bilirubin: 0.68 mg/dL (ref 0.20–1.20)
Total Protein: 8 g/dL (ref 6.4–8.3)

## 2013-03-29 MED ORDER — IRINOTECAN HCL CHEMO INJECTION 100 MG/5ML
180.0000 mg/m2 | Freq: Once | INTRAVENOUS | Status: AC
  Administered 2013-03-29: 420 mg via INTRAVENOUS
  Filled 2013-03-29: qty 21

## 2013-03-29 MED ORDER — PROCHLORPERAZINE EDISYLATE 5 MG/ML IJ SOLN
10.0000 mg | Freq: Once | INTRAMUSCULAR | Status: AC
  Administered 2013-03-29: 10 mg via INTRAVENOUS

## 2013-03-29 MED ORDER — FLUOROURACIL CHEMO INJECTION 2.5 GM/50ML
400.0000 mg/m2 | Freq: Once | INTRAVENOUS | Status: AC
  Administered 2013-03-29: 950 mg via INTRAVENOUS
  Filled 2013-03-29: qty 19

## 2013-03-29 MED ORDER — PALONOSETRON HCL INJECTION 0.25 MG/5ML
0.2500 mg | Freq: Once | INTRAVENOUS | Status: AC
  Administered 2013-03-29: 0.25 mg via INTRAVENOUS

## 2013-03-29 MED ORDER — DEXAMETHASONE SODIUM PHOSPHATE 20 MG/5ML IJ SOLN: 20.0000 mg | Freq: Once | INTRAMUSCULAR | Status: DC

## 2013-03-29 MED ORDER — LOPERAMIDE HCL 2 MG PO CAPS: ORAL_CAPSULE | ORAL | Status: DC

## 2013-03-29 MED ORDER — SODIUM CHLORIDE 0.9 % IV SOLN
Freq: Once | INTRAVENOUS | Status: AC
  Administered 2013-03-29: 11:00:00 via INTRAVENOUS

## 2013-03-29 MED ORDER — IOHEXOL 300 MG/ML  SOLN
80.0000 mL | Freq: Once | INTRAMUSCULAR | Status: AC | PRN
  Administered 2013-03-29: 80 mL via INTRAVENOUS

## 2013-03-29 MED ORDER — AMLODIPINE BESYLATE 10 MG PO TABS: 10.0000 mg | ORAL_TABLET | Freq: Every day | ORAL | Status: DC

## 2013-03-29 MED ORDER — SODIUM CHLORIDE 0.9 % IJ SOLN
10.0000 mL | INTRAMUSCULAR | Status: DC | PRN
  Filled 2013-03-29: qty 10

## 2013-03-29 MED ORDER — DEXAMETHASONE SODIUM PHOSPHATE 20 MG/5ML IJ SOLN
INTRAMUSCULAR | Status: AC
  Filled 2013-03-29: qty 5

## 2013-03-29 MED ORDER — LISINOPRIL 10 MG PO TABS: 10.0000 mg | ORAL_TABLET | Freq: Every day | ORAL | Status: DC

## 2013-03-29 MED ORDER — SODIUM CHLORIDE 0.9 % IV SOLN
150.0000 mg | Freq: Once | INTRAVENOUS | Status: AC
  Administered 2013-03-29: 150 mg via INTRAVENOUS
  Filled 2013-03-29: qty 5

## 2013-03-29 MED ORDER — PROCHLORPERAZINE MALEATE 10 MG PO TABS: 10.0000 mg | ORAL_TABLET | Freq: Four times a day (QID) | ORAL | Status: DC | PRN

## 2013-03-29 MED ORDER — POTASSIUM CHLORIDE CRYS ER 20 MEQ PO TBCR: 20.0000 meq | EXTENDED_RELEASE_TABLET | Freq: Every day | ORAL | Status: DC

## 2013-03-29 MED ORDER — SODIUM CHLORIDE 0.9 % IV SOLN
5.0000 mg/kg | Freq: Once | INTRAVENOUS | Status: AC
  Administered 2013-03-29: 525 mg via INTRAVENOUS
  Filled 2013-03-29: qty 21

## 2013-03-29 MED ORDER — LEUCOVORIN CALCIUM INJECTION 350 MG
950.0000 mg | Freq: Once | INTRAVENOUS | Status: AC
  Administered 2013-03-29: 950 mg via INTRAVENOUS
  Filled 2013-03-29: qty 47.5

## 2013-03-29 MED ORDER — DEXAMETHASONE SODIUM PHOSPHATE 20 MG/5ML IJ SOLN
12.0000 mg | Freq: Once | INTRAMUSCULAR | Status: AC
  Administered 2013-03-29: 12 mg via INTRAVENOUS

## 2013-03-29 MED ORDER — PALONOSETRON HCL INJECTION 0.25 MG/5ML
INTRAVENOUS | Status: AC
  Filled 2013-03-29: qty 5

## 2013-03-29 MED ORDER — HEPARIN SOD (PORK) LOCK FLUSH 100 UNIT/ML IV SOLN
500.0000 [IU] | Freq: Once | INTRAVENOUS | Status: DC | PRN
  Filled 2013-03-29: qty 5

## 2013-03-29 MED ORDER — SODIUM CHLORIDE 0.9 % IV SOLN
2400.0000 mg/m2 | INTRAVENOUS | Status: DC
  Administered 2013-03-29: 5550 mg via INTRAVENOUS
  Filled 2013-03-29: qty 111

## 2013-03-29 NOTE — Progress Notes (Signed)
OFFICE PROGRESS NOTE  Interval history:  [name] returns for followup of metastatic colon cancer. She completed cycle 5 FOLFIRI/Avastin on 03/15/2013. She developed nausea on the day of pump discontinuation. The nausea lasted 3 days. No vomiting. No mouth sores. No diarrhea. She denies any bleeding. No chest pain or shortness of breath. No leg swelling or calf pain. She denies abdominal pain. She misunderstood our previous instructions regarding blood pressure medication and discontinued Norvasc when she started lisinopril.   Objective: Blood pressure 171/94, pulse 78, temperature 98.4 F (36.9 C), temperature source Oral, resp. rate 20, height 5\' 11"  (1.803 m), weight 235 lb 6.4 oz (106.777 kg), SpO2 100.00%.  No thrush or ulceration. Lungs are clear. Regular cardiac rhythm. Port-A-Cath site is without erythema. Abdomen is soft and nontender. No hepatomegaly. Extremities without edema. Calves soft and nontender. No skin rash.  Lab Results: Lab Results  Component Value Date   WBC 5.1 03/29/2013   HGB 11.4* 03/29/2013   HCT 34.8 03/29/2013   MCV 89.0 03/29/2013   PLT 208 03/29/2013    Chemistry:    Chemistry      Component Value Date/Time   NA 142 03/15/2013 0850   NA 137 02/15/2013 1200   K 3.5 03/15/2013 0850   K 3.4* 02/15/2013 1200   CL 103 02/15/2013 1200   CL 105 10/19/2012 0928   CO2 24 03/15/2013 0850   CO2 24 02/15/2013 1200   BUN 15.5 03/15/2013 0850   BUN 10 02/15/2013 1200   CREATININE 0.9 03/15/2013 0850   CREATININE 0.82 02/15/2013 1200   CREATININE 0.89 03/02/2012 1524      Component Value Date/Time   CALCIUM 10.2 03/15/2013 0850   CALCIUM 9.9 02/15/2013 1200   ALKPHOS 67 03/15/2013 0850   ALKPHOS 73 02/15/2013 1200   AST 15 03/15/2013 0850   AST 18 02/15/2013 1200   ALT 11 03/15/2013 0850   ALT 15 02/15/2013 1200   BILITOT 0.56 03/15/2013 0850   BILITOT 0.3 02/15/2013 1200       Studies/Results: Ct Chest W Contrast  03/29/2013   CLINICAL DATA:  Colon  cancer with liver metastases. Chemotherapy and process.  EXAM: CT CHEST WITH CONTRAST  TECHNIQUE: Multidetector CT imaging of the chest was performed during intravenous contrast administration.  CONTRAST:  80mL OMNIPAQUE IOHEXOL 300 MG/ML  SOLN  COMPARISON:  01/11/2013.  FINDINGS: Right subclavian Port-A-Cath terminates in the high right atrium. Low-attenuation lesion in the left lobe of the thyroid measures approximately 1.7 cm, as before. No pathologically enlarged mediastinal, hilar or axillary lymph nodes. Heart is at the upper limits of normal in size. No pericardial effusion.  Scattered bilateral pulmonary nodules appear slightly different in configuration than on 01/11/2013. Left upper lobe nodule (image 18) measures 12 x 13 mm (previously 14 x 14 mm). Other nodules are likely stable in size, for example right middle lobe nodule measures 10 x 13 mm (image 37), previously 11 x 12 mm. No new nodules. No pleural fluid. Airway is unremarkable.  Incidental imaging of the upper abdomen shows decreased attenuation throughout the visualized portion of the liver. There may be postoperative changes in the inferior right hepatic lobe, as before. 4 mm low-attenuation lesion in the caudate lobe is unchanged. No worrisome lytic or sclerotic lesions. Degenerative changes are seen in the spine.  IMPRESSION: 1. Possible slight decrease in size of a left upper lobe metastasis. Other pulmonary nodules appear grossly stable in size. 2. Liver appears fatty.   Electronically Signed  By: Leanna Battles M.D.   On: 03/29/2013 08:25    Medications: I have reviewed the patient's current medications.  Assessment/Plan:  1. Metastatic colon cancer (T4, N0, M1) adenocarcinoma of the sigmoid colon status post sigmoid colectomy and partial right hepatectomy 04/11/2012. K-ras wild-type on the initial codon 12 and 13 testing. K-ras Q61L mutation identified on extended testing. She completed cycle 1 CAPOX beginning 05/25/2012.  She  completed cycle 8 CAPOX beginning 10/19/2012.  CEA on 10/19/2012 normal at 0.8.  CEA 6.1 on 01/11/2013  Restaging CT on 01/11/2013 consistent with multiple new pulmonary metastases  Cycle 1 of FOLFIRI/Avastin 01/18/2013; cycle 2 02/01/2013,cycle 3 02/16/2013.  CEA 5.0 on 03/01/2013.  Cycle 4 FOLFIRI/Avastin 03/01/2013. Cycle 5 FOLFIRI/Avastin 03/15/2013. Restaging chest CT 03/29/2013 with possible slight decrease in size of a left upper lobe metastasis. Other pulmonary nodules stable. 2. Microcytic anemia, iron deficiency. Improved.  3. Markedly elevated preoperative CEA. The CEA was mildly elevated on 05/16/2012. The CEA was in normal range at 0.5 on 08/17/2012 and 0.8 on 10/19/2012. Elevated at 6.1 on 01/11/2013. CEA 5.0 on 03/01/2013. 4. Status post Port-A-Cath placement 03/14/2012. X-ray showed the Port-A-Cath tip to be in the azygos vein. The left-sided Port-A-Cath was removed and a new right Port-A-Cath was placed on 05/23/2012. 5. 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 6. Hypertension-persistent on amlodipine. Lisinopril 5 mg daily added on 03/01/2013. Lisinopril increased to 10 mg daily on 03/15/2013. She misunderstood the instructions and discontinued amlodipine. 7. Delayed nausea following cycle 5 FOLFIRI/Avastin. Emend will be added to the premedication regimen.  Disposition-she appears stable. She has completed 5 cycles of FOLFIRI/Avastin. Restaging chest CT shows stable disease. Plan to proceed with cycle 6 FOLFIRI/Avastin today as scheduled.  She had delayed nausea following cycle 5. She is currently receiving Aloxi. We will add Emend.  Blood pressure continues to be elevated. She is currently taking lisinopril 10 mg daily. As noted above she misunderstood our previous instructions and had discontinued amlodipine. She will resume amlodipine and continued lisinopril.  She will return for a followup visit and  cycle 7 in 2 weeks. She will contact the office in the interim with any problems.  Plan reviewed with Dr. Truett Perna.  Lonna Cobb ANP/GNP-BC

## 2013-03-29 NOTE — Patient Instructions (Signed)
[facility] Discharge Instructions for Patients Receiving Chemotherapy  Today you received the following chemotherapy agents:  Camptosar, leucovorin, 39fu, pump To help prevent nausea and vomiting after your treatment, we encourage you to take your nausea medication If you develop nausea and vomiting that is not controlled by your nausea medication, call the clinic.   BELOW ARE SYMPTOMS THAT SHOULD BE REPORTED IMMEDIATELY:  *FEVER GREATER THAN 100.5 F  *CHILLS WITH OR WITHOUT FEVER  NAUSEA AND VOMITING THAT IS NOT CONTROLLED WITH YOUR NAUSEA MEDICATION  *UNUSUAL SHORTNESS OF BREATH  *UNUSUAL BRUISING OR BLEEDING  TENDERNESS IN MOUTH AND THROAT WITH OR WITHOUT PRESENCE OF ULCERS  *URINARY PROBLEMS  *BOWEL PROBLEMS  UNUSUAL RASH Items with * indicate a potential emergency and should be followed up as soon as possible.  Feel free to call the clinic you have any questions or concerns. The clinic phone number is [phone].

## 2013-03-29 NOTE — Telephone Encounter (Signed)
gv and printed appt sched and avs for pt for DEC adn Jan ....sed add tx for Jan 2015

## 2013-03-29 NOTE — Progress Notes (Signed)
1339 Patient complains of nausea. Dr. Truett Perna notified order given and carried out for Compazine 10 mg IV push.

## 2013-03-30 ENCOUNTER — Encounter (HOSPITAL_COMMUNITY): Payer: Self-pay

## 2013-03-30 LAB — CEA: CEA: 4.9 ng/mL (ref 0.0–5.0)

## 2013-03-31 ENCOUNTER — Ambulatory Visit (HOSPITAL_BASED_OUTPATIENT_CLINIC_OR_DEPARTMENT_OTHER): Payer: Medicaid Other

## 2013-03-31 VITALS — BP 170/77 | HR 58 | Temp 98.0°F

## 2013-03-31 DIAGNOSIS — C787 Secondary malignant neoplasm of liver and intrahepatic bile duct: Secondary | ICD-10-CM

## 2013-03-31 DIAGNOSIS — C187 Malignant neoplasm of sigmoid colon: Secondary | ICD-10-CM

## 2013-03-31 DIAGNOSIS — Z452 Encounter for adjustment and management of vascular access device: Secondary | ICD-10-CM

## 2013-03-31 DIAGNOSIS — C189 Malignant neoplasm of colon, unspecified: Secondary | ICD-10-CM

## 2013-03-31 MED ORDER — HEPARIN SOD (PORK) LOCK FLUSH 100 UNIT/ML IV SOLN
500.0000 [IU] | Freq: Once | INTRAVENOUS | Status: AC | PRN
  Administered 2013-03-31: 500 [IU]
  Filled 2013-03-31: qty 5

## 2013-03-31 MED ORDER — SODIUM CHLORIDE 0.9 % IJ SOLN
10.0000 mL | INTRAMUSCULAR | Status: DC | PRN
  Administered 2013-03-31: 10 mL
  Filled 2013-03-31: qty 10

## 2013-04-09 ENCOUNTER — Other Ambulatory Visit: Payer: Self-pay | Admitting: Oncology

## 2013-04-12 ENCOUNTER — Telehealth: Payer: Self-pay | Admitting: Oncology

## 2013-04-12 ENCOUNTER — Telehealth: Payer: Self-pay | Admitting: *Deleted

## 2013-04-12 ENCOUNTER — Ambulatory Visit (HOSPITAL_BASED_OUTPATIENT_CLINIC_OR_DEPARTMENT_OTHER): Payer: Medicaid Other | Admitting: Oncology

## 2013-04-12 ENCOUNTER — Other Ambulatory Visit (HOSPITAL_BASED_OUTPATIENT_CLINIC_OR_DEPARTMENT_OTHER): Payer: Medicaid Other

## 2013-04-12 ENCOUNTER — Ambulatory Visit (HOSPITAL_BASED_OUTPATIENT_CLINIC_OR_DEPARTMENT_OTHER): Payer: Medicaid Other

## 2013-04-12 VITALS — BP 147/76

## 2013-04-12 VITALS — BP 146/93 | HR 75 | Temp 97.8°F | Resp 18 | Ht 71.0 in | Wt 235.9 lb

## 2013-04-12 DIAGNOSIS — I82B19 Acute embolism and thrombosis of unspecified subclavian vein: Secondary | ICD-10-CM

## 2013-04-12 DIAGNOSIS — Z5111 Encounter for antineoplastic chemotherapy: Secondary | ICD-10-CM

## 2013-04-12 DIAGNOSIS — I10 Essential (primary) hypertension: Secondary | ICD-10-CM

## 2013-04-12 DIAGNOSIS — C187 Malignant neoplasm of sigmoid colon: Secondary | ICD-10-CM

## 2013-04-12 DIAGNOSIS — C7800 Secondary malignant neoplasm of unspecified lung: Secondary | ICD-10-CM

## 2013-04-12 DIAGNOSIS — C787 Secondary malignant neoplasm of liver and intrahepatic bile duct: Secondary | ICD-10-CM

## 2013-04-12 DIAGNOSIS — C189 Malignant neoplasm of colon, unspecified: Secondary | ICD-10-CM

## 2013-04-12 DIAGNOSIS — D509 Iron deficiency anemia, unspecified: Secondary | ICD-10-CM

## 2013-04-12 DIAGNOSIS — Z5112 Encounter for antineoplastic immunotherapy: Secondary | ICD-10-CM

## 2013-04-12 DIAGNOSIS — R110 Nausea: Secondary | ICD-10-CM

## 2013-04-12 LAB — UA PROTEIN, DIPSTICK - CHCC: Protein, ur: NEGATIVE mg/dL

## 2013-04-12 LAB — COMPREHENSIVE METABOLIC PANEL (CC13)
Albumin: 4.1 g/dL (ref 3.5–5.0)
BUN: 14.3 mg/dL (ref 7.0–26.0)
Calcium: 10.1 mg/dL (ref 8.4–10.4)
Chloride: 108 mEq/L (ref 98–109)
Glucose: 93 mg/dl (ref 70–140)
Potassium: 3.7 mEq/L (ref 3.5–5.1)
Sodium: 141 mEq/L (ref 136–145)
Total Protein: 8.1 g/dL (ref 6.4–8.3)

## 2013-04-12 LAB — CBC WITH DIFFERENTIAL/PLATELET
Basophils Absolute: 0 10*3/uL (ref 0.0–0.1)
EOS%: 3 % (ref 0.0–7.0)
Eosinophils Absolute: 0.2 10*3/uL (ref 0.0–0.5)
HCT: 35.4 % (ref 34.8–46.6)
HGB: 12 g/dL (ref 11.6–15.9)
LYMPH%: 33.4 % (ref 14.0–49.7)
MCH: 29.6 pg (ref 25.1–34.0)
MONO#: 0.4 10*3/uL (ref 0.1–0.9)
NEUT#: 3.4 10*3/uL (ref 1.5–6.5)
NEUT%: 56.6 % (ref 38.4–76.8)
Platelets: 194 10*3/uL (ref 145–400)
RDW: 15.5 % — ABNORMAL HIGH (ref 11.2–14.5)
WBC: 6 10*3/uL (ref 3.9–10.3)
lymph#: 2 10*3/uL (ref 0.9–3.3)

## 2013-04-12 MED ORDER — PALONOSETRON HCL INJECTION 0.25 MG/5ML
INTRAVENOUS | Status: AC
  Filled 2013-04-12: qty 5

## 2013-04-12 MED ORDER — IRINOTECAN HCL CHEMO INJECTION 100 MG/5ML
180.0000 mg/m2 | Freq: Once | INTRAVENOUS | Status: AC
  Administered 2013-04-12: 420 mg via INTRAVENOUS
  Filled 2013-04-12: qty 21

## 2013-04-12 MED ORDER — DEXAMETHASONE SODIUM PHOSPHATE 20 MG/5ML IJ SOLN
INTRAMUSCULAR | Status: AC
  Filled 2013-04-12: qty 5

## 2013-04-12 MED ORDER — LEUCOVORIN CALCIUM INJECTION 350 MG
950.0000 mg | Freq: Once | INTRAVENOUS | Status: AC
  Administered 2013-04-12: 950 mg via INTRAVENOUS
  Filled 2013-04-12: qty 47.5

## 2013-04-12 MED ORDER — SODIUM CHLORIDE 0.9 % IV SOLN
2400.0000 mg/m2 | INTRAVENOUS | Status: DC
  Administered 2013-04-12: 5550 mg via INTRAVENOUS
  Filled 2013-04-12: qty 111

## 2013-04-12 MED ORDER — SODIUM CHLORIDE 0.9 % IV SOLN
Freq: Once | INTRAVENOUS | Status: AC
  Administered 2013-04-12: 11:00:00 via INTRAVENOUS

## 2013-04-12 MED ORDER — SODIUM CHLORIDE 0.9 % IV SOLN
150.0000 mg | Freq: Once | INTRAVENOUS | Status: AC
  Administered 2013-04-12: 150 mg via INTRAVENOUS
  Filled 2013-04-12: qty 5

## 2013-04-12 MED ORDER — ATROPINE SULFATE 1 MG/ML IJ SOLN
INTRAMUSCULAR | Status: AC
  Filled 2013-04-12: qty 1

## 2013-04-12 MED ORDER — PALONOSETRON HCL INJECTION 0.25 MG/5ML
0.2500 mg | Freq: Once | INTRAVENOUS | Status: AC
  Administered 2013-04-12: 0.25 mg via INTRAVENOUS

## 2013-04-12 MED ORDER — DEXAMETHASONE SODIUM PHOSPHATE 20 MG/5ML IJ SOLN
20.0000 mg | Freq: Once | INTRAMUSCULAR | Status: AC
  Administered 2013-04-12: 20 mg via INTRAVENOUS

## 2013-04-12 MED ORDER — FLUOROURACIL CHEMO INJECTION 2.5 GM/50ML
400.0000 mg/m2 | Freq: Once | INTRAVENOUS | Status: AC
  Administered 2013-04-12: 950 mg via INTRAVENOUS
  Filled 2013-04-12: qty 19

## 2013-04-12 MED ORDER — SODIUM CHLORIDE 0.9 % IV SOLN
5.0000 mg/kg | Freq: Once | INTRAVENOUS | Status: AC
  Administered 2013-04-12: 525 mg via INTRAVENOUS
  Filled 2013-04-12: qty 21

## 2013-04-12 MED ORDER — ATROPINE SULFATE 1 MG/ML IJ SOLN
0.5000 mg | Freq: Once | INTRAMUSCULAR | Status: AC | PRN
  Administered 2013-04-12: 0.5 mg via INTRAVENOUS

## 2013-04-12 NOTE — Telephone Encounter (Signed)
Per staff message and POF I have scheduled appts.  JMW  

## 2013-04-12 NOTE — Telephone Encounter (Signed)
appts made per 12/17 POF Email to MW to Add Tx AVS and CAL taken to pt in Tx Room shh

## 2013-04-12 NOTE — Progress Notes (Signed)
   [facility]    OFFICE PROGRESS NOTE   INTERVAL HISTORY:   She had an episode of nausea during the chemotherapy infusion with chemotherapy on 03/29/2013. No further nausea. No mouth sores or diarrhea.   Objective:  Vital signs in last 24 hours:  Blood pressure 146/93, pulse 75, temperature 97.8 F (36.6 C), temperature source Oral, resp. rate 18, height 5\' 11"  (1.803 m), weight 235 lb 14.4 oz (107.004 kg).    HEENT: no thrush or ulcer Resp: lungs clear bilaterally Cardio:  Regular rate and rhythm GI: no hepatomegaly, nontender Vascular:  No leg edema Skin:hyperpigmentation over the hands   Portacath/PICC-without erythema  Lab Results:  Lab Results  Component Value Date   WBC 6.0 04/12/2013   HGB 12.0 04/12/2013   HCT 35.4 04/12/2013   MCV 87.4 04/12/2013   PLT 194 04/12/2013  ANC 3.4    Medications: I have reviewed the patient's current medications.  Assessment/Plan: 1. Metastatic colon cancer (T4, N0, M1) adenocarcinoma of the sigmoid colon status post sigmoid colectomy and partial right hepatectomy 04/11/2012. K-ras wild-type on the initial codon 12 and 13 testing. K-ras Q61L mutation identified on extended testing. She completed cycle 1 CAPOX beginning 05/25/2012.  She completed cycle 8 CAPOX beginning 10/19/2012.  CEA on 10/19/2012 normal at 0.8.  CEA 6.1 on 01/11/2013  Restaging CT on 01/11/2013 consistent with multiple new pulmonary metastases  Cycle 1 of FOLFIRI/Avastin 01/18/2013; cycle 2 02/01/2013,cycle 3 02/16/2013.  CEA 5.0 on 03/01/2013.  Cycle 4 FOLFIRI/Avastin 03/01/2013.  Cycle 5 FOLFIRI/Avastin 03/15/2013. Restaging chest CT 03/29/2013 with possible slight decrease in size of a left upper lobe metastasis. Other pulmonary nodules stable. Cycle 6 FOLFIRI/Avastin 03/29/2013 2. Microcytic anemia, iron deficiency. Improved.  3. Markedly elevated preoperative CEA. The CEA was mildly elevated on 05/16/2012. The CEA was in normal  range at 0.5 on 08/17/2012 and 0.8 on 10/19/2012. Elevated at 6.1 on 01/11/2013. CEA 4.9 on 03/29/2013 4. Status post Port-A-Cath placement 03/14/2012. X-ray showed the Port-A-Cath tip to be in the azygos vein. The left-sided Port-A-Cath was removed and a new right Port-A-Cath was placed on 05/23/2012. 5. 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 6. Hypertension-persistent on amlodipine. Lisinopril 5 mg daily added on 03/01/2013. Lisinopril increased to 10 mg daily on 03/15/2013.  7. Delayed nausea following cycle 5 FOLFIRI/Avastin. Emend was  added to the premedication regimen.    Disposition:  Ms. Phillis appears stable. The plan is to proceed with cycle 7 of FOLFIRI/Avastin today. She will return for an office visit and the next cycle of chemotherapy on 05/03/2013.   Thornton Papas, MD  04/12/2013  7:45 PM

## 2013-04-12 NOTE — Progress Notes (Signed)
Met with patient to assess for needs.  She is feeling very good.  She is excited about the holidays.  She stated everything was going well with her transportation. She is in touch with the [facility] and denies any needs at present time.

## 2013-04-12 NOTE — Patient Instructions (Signed)
[facility] Discharge Instructions for Patients Receiving Chemotherapy  Today you received the following chemotherapy agents Avastin, Leucovorin, Camptosar and Adrucil.  To help prevent nausea and vomiting after your treatment, we encourage you to take your nausea medication as prescribed.   If you develop nausea and vomiting that is not controlled by your nausea medication, call the clinic.   BELOW ARE SYMPTOMS THAT SHOULD BE REPORTED IMMEDIATELY:  *FEVER GREATER THAN 100.5 F  *CHILLS WITH OR WITHOUT FEVER  NAUSEA AND VOMITING THAT IS NOT CONTROLLED WITH YOUR NAUSEA MEDICATION  *UNUSUAL SHORTNESS OF BREATH  *UNUSUAL BRUISING OR BLEEDING  TENDERNESS IN MOUTH AND THROAT WITH OR WITHOUT PRESENCE OF ULCERS  *URINARY PROBLEMS  *BOWEL PROBLEMS  UNUSUAL RASH Items with * indicate a potential emergency and should be followed up as soon as possible.  Feel free to call the clinic you have any questions or concerns. The clinic phone number is [phone].    

## 2013-04-14 ENCOUNTER — Ambulatory Visit (HOSPITAL_BASED_OUTPATIENT_CLINIC_OR_DEPARTMENT_OTHER): Payer: Medicaid Other

## 2013-04-14 VITALS — BP 155/88 | HR 69 | Temp 97.7°F | Resp 20

## 2013-04-14 DIAGNOSIS — C787 Secondary malignant neoplasm of liver and intrahepatic bile duct: Secondary | ICD-10-CM

## 2013-04-14 DIAGNOSIS — C189 Malignant neoplasm of colon, unspecified: Secondary | ICD-10-CM

## 2013-04-14 DIAGNOSIS — C187 Malignant neoplasm of sigmoid colon: Secondary | ICD-10-CM

## 2013-04-14 MED ORDER — SODIUM CHLORIDE 0.9 % IJ SOLN
10.0000 mL | INTRAMUSCULAR | Status: DC | PRN
  Administered 2013-04-14: 10 mL
  Filled 2013-04-14: qty 10

## 2013-04-14 MED ORDER — HEPARIN SOD (PORK) LOCK FLUSH 100 UNIT/ML IV SOLN
500.0000 [IU] | Freq: Once | INTRAVENOUS | Status: AC | PRN
  Administered 2013-04-14: 500 [IU]
  Filled 2013-04-14: qty 5

## 2013-04-14 NOTE — Patient Instructions (Signed)

## 2013-05-03 ENCOUNTER — Telehealth: Payer: Self-pay | Admitting: Oncology

## 2013-05-03 ENCOUNTER — Ambulatory Visit (HOSPITAL_BASED_OUTPATIENT_CLINIC_OR_DEPARTMENT_OTHER): Payer: Medicaid Other | Admitting: Oncology

## 2013-05-03 ENCOUNTER — Other Ambulatory Visit (HOSPITAL_BASED_OUTPATIENT_CLINIC_OR_DEPARTMENT_OTHER): Payer: Medicaid Other

## 2013-05-03 ENCOUNTER — Encounter: Payer: Self-pay | Admitting: Oncology

## 2013-05-03 ENCOUNTER — Ambulatory Visit (HOSPITAL_BASED_OUTPATIENT_CLINIC_OR_DEPARTMENT_OTHER): Payer: Medicaid Other

## 2013-05-03 VITALS — BP 141/89 | HR 78 | Temp 97.5°F | Resp 19 | Ht 71.0 in | Wt 236.1 lb

## 2013-05-03 DIAGNOSIS — C787 Secondary malignant neoplasm of liver and intrahepatic bile duct: Principal | ICD-10-CM

## 2013-05-03 DIAGNOSIS — Z5112 Encounter for antineoplastic immunotherapy: Secondary | ICD-10-CM

## 2013-05-03 DIAGNOSIS — C187 Malignant neoplasm of sigmoid colon: Secondary | ICD-10-CM

## 2013-05-03 DIAGNOSIS — Z7901 Long term (current) use of anticoagulants: Secondary | ICD-10-CM

## 2013-05-03 DIAGNOSIS — I82A19 Acute embolism and thrombosis of unspecified axillary vein: Secondary | ICD-10-CM

## 2013-05-03 DIAGNOSIS — I10 Essential (primary) hypertension: Secondary | ICD-10-CM

## 2013-05-03 DIAGNOSIS — I82B19 Acute embolism and thrombosis of unspecified subclavian vein: Secondary | ICD-10-CM

## 2013-05-03 DIAGNOSIS — Z5111 Encounter for antineoplastic chemotherapy: Secondary | ICD-10-CM

## 2013-05-03 DIAGNOSIS — C189 Malignant neoplasm of colon, unspecified: Secondary | ICD-10-CM

## 2013-05-03 DIAGNOSIS — C7800 Secondary malignant neoplasm of unspecified lung: Secondary | ICD-10-CM

## 2013-05-03 DIAGNOSIS — C183 Malignant neoplasm of hepatic flexure: Secondary | ICD-10-CM

## 2013-05-03 LAB — COMPREHENSIVE METABOLIC PANEL (CC13)
ALBUMIN: 4.2 g/dL (ref 3.5–5.0)
ALT: 16 U/L (ref 0–55)
ANION GAP: 9 meq/L (ref 3–11)
AST: 19 U/L (ref 5–34)
Alkaline Phosphatase: 74 U/L (ref 40–150)
BUN: 22.7 mg/dL (ref 7.0–26.0)
CALCIUM: 10.3 mg/dL (ref 8.4–10.4)
CHLORIDE: 107 meq/L (ref 98–109)
CO2: 24 mEq/L (ref 22–29)
Creatinine: 0.9 mg/dL (ref 0.6–1.1)
Glucose: 93 mg/dl (ref 70–140)
POTASSIUM: 3.9 meq/L (ref 3.5–5.1)
SODIUM: 139 meq/L (ref 136–145)
TOTAL PROTEIN: 8.1 g/dL (ref 6.4–8.3)
Total Bilirubin: 0.68 mg/dL (ref 0.20–1.20)

## 2013-05-03 LAB — CBC WITH DIFFERENTIAL/PLATELET
BASO%: 0.9 % (ref 0.0–2.0)
Basophils Absolute: 0.1 10*3/uL (ref 0.0–0.1)
EOS%: 3.2 % (ref 0.0–7.0)
Eosinophils Absolute: 0.2 10*3/uL (ref 0.0–0.5)
HEMATOCRIT: 36.7 % (ref 34.8–46.6)
HGB: 12.2 g/dL (ref 11.6–15.9)
LYMPH#: 2.3 10*3/uL (ref 0.9–3.3)
LYMPH%: 44 % (ref 14.0–49.7)
MCH: 29.5 pg (ref 25.1–34.0)
MCHC: 33.2 g/dL (ref 31.5–36.0)
MCV: 88.9 fL (ref 79.5–101.0)
MONO#: 0.5 10*3/uL (ref 0.1–0.9)
MONO%: 9.9 % (ref 0.0–14.0)
NEUT#: 2.2 10*3/uL (ref 1.5–6.5)
NEUT%: 42 % (ref 38.4–76.8)
Platelets: 216 10*3/uL (ref 145–400)
RBC: 4.13 10*6/uL (ref 3.70–5.45)
RDW: 16 % — ABNORMAL HIGH (ref 11.2–14.5)
WBC: 5.3 10*3/uL (ref 3.9–10.3)

## 2013-05-03 MED ORDER — FLUOROURACIL CHEMO INJECTION 2.5 GM/50ML
400.0000 mg/m2 | Freq: Once | INTRAVENOUS | Status: AC
  Administered 2013-05-03: 950 mg via INTRAVENOUS
  Filled 2013-05-03: qty 19

## 2013-05-03 MED ORDER — IRINOTECAN HCL CHEMO INJECTION 100 MG/5ML
180.0000 mg/m2 | Freq: Once | INTRAVENOUS | Status: AC
  Administered 2013-05-03: 420 mg via INTRAVENOUS
  Filled 2013-05-03: qty 21

## 2013-05-03 MED ORDER — DEXAMETHASONE SODIUM PHOSPHATE 20 MG/5ML IJ SOLN
INTRAMUSCULAR | Status: AC
  Filled 2013-05-03: qty 5

## 2013-05-03 MED ORDER — ATROPINE SULFATE 1 MG/ML IJ SOLN
INTRAMUSCULAR | Status: AC
Start: 2013-05-03 — End: 2013-05-03
  Filled 2013-05-03: qty 1

## 2013-05-03 MED ORDER — SODIUM CHLORIDE 0.9 % IV SOLN
2400.0000 mg/m2 | INTRAVENOUS | Status: DC
  Administered 2013-05-03: 5550 mg via INTRAVENOUS
  Filled 2013-05-03: qty 111

## 2013-05-03 MED ORDER — ATROPINE SULFATE 1 MG/ML IJ SOLN
0.5000 mg | Freq: Once | INTRAMUSCULAR | Status: AC | PRN
  Administered 2013-05-03: 0.5 mg via INTRAVENOUS

## 2013-05-03 MED ORDER — PALONOSETRON HCL INJECTION 0.25 MG/5ML
0.2500 mg | Freq: Once | INTRAVENOUS | Status: AC
  Administered 2013-05-03: 0.25 mg via INTRAVENOUS

## 2013-05-03 MED ORDER — PALONOSETRON HCL INJECTION 0.25 MG/5ML
INTRAVENOUS | Status: AC
  Filled 2013-05-03: qty 5

## 2013-05-03 MED ORDER — LEUCOVORIN CALCIUM INJECTION 350 MG
950.0000 mg | Freq: Once | INTRAVENOUS | Status: AC
  Administered 2013-05-03: 950 mg via INTRAVENOUS
  Filled 2013-05-03: qty 47.5

## 2013-05-03 MED ORDER — DEXAMETHASONE SODIUM PHOSPHATE 20 MG/5ML IJ SOLN
12.0000 mg | Freq: Once | INTRAMUSCULAR | Status: AC
  Administered 2013-05-03: 12 mg via INTRAVENOUS

## 2013-05-03 MED ORDER — SODIUM CHLORIDE 0.9 % IV SOLN
5.0000 mg/kg | Freq: Once | INTRAVENOUS | Status: AC
  Administered 2013-05-03: 525 mg via INTRAVENOUS
  Filled 2013-05-03: qty 21

## 2013-05-03 MED ORDER — SODIUM CHLORIDE 0.9 % IV SOLN
Freq: Once | INTRAVENOUS | Status: AC
  Administered 2013-05-03: 12:00:00 via INTRAVENOUS

## 2013-05-03 MED ORDER — SODIUM CHLORIDE 0.9 % IV SOLN
150.0000 mg | Freq: Once | INTRAVENOUS | Status: AC
  Administered 2013-05-03: 150 mg via INTRAVENOUS
  Filled 2013-05-03: qty 5

## 2013-05-03 NOTE — Telephone Encounter (Signed)
[facility] ADN PRINTED APPT SCHED AND AVS FOR PT FOR jAN AND fEB....SED ADDED TX.

## 2013-05-03 NOTE — Patient Instructions (Signed)
[facility] Discharge Instructions for Patients Receiving Chemotherapy  Today you received the following chemotherapy agents: Avastin, Irinotecan, Leucovorin, 5FU  To help prevent nausea and vomiting after your treatment, we encourage you to take your nausea medication as prescribed.   If you develop nausea and vomiting that is not controlled by your nausea medication, call the clinic.   BELOW ARE SYMPTOMS THAT SHOULD BE REPORTED IMMEDIATELY:  *FEVER GREATER THAN 100.5 F  *CHILLS WITH OR WITHOUT FEVER  NAUSEA AND VOMITING THAT IS NOT CONTROLLED WITH YOUR NAUSEA MEDICATION  *UNUSUAL SHORTNESS OF BREATH  *UNUSUAL BRUISING OR BLEEDING  TENDERNESS IN MOUTH AND THROAT WITH OR WITHOUT PRESENCE OF ULCERS  *URINARY PROBLEMS  *BOWEL PROBLEMS  UNUSUAL RASH Items with * indicate a potential emergency and should be followed up as soon as possible.  Feel free to call the clinic you have any questions or concerns. The clinic phone number is [phone].

## 2013-05-03 NOTE — Progress Notes (Signed)
   [name]    OFFICE PROGRESS NOTE   INTERVAL HISTORY:   [name] returns for scheduled followup of metastatic colon cancer. She completed another cycle of FOLFIRI/Avastin on 04/12/2013. She tolerated chemotherapy well. No nausea/vomiting, mouth sores, or diarrhea. No complaint.  Objective:  Vital signs in last 24 hours:  Blood pressure 141/89, pulse 78, temperature 97.5 F (36.4 C), temperature source Oral, resp. rate 19, height _0  (1.803 m), weight 236 lb 1.6 oz (107.094 kg).    HEENT: No thrush or ulcers, hyperpigmentation at the tongue, palate, and buccal mucosa Resp: Lungs clear bilaterally Cardio: Regular rate and rhythm GI: No hepatomegaly, nontender Vascular: No leg edema  Skin: Hyperpigmentation over the hands   Portacath/PICC-without erythema  Lab Results:  Lab Results  Component Value Date   WBC 5.3 05/03/2013   HGB 12.2 05/03/2013   HCT 36.7 05/03/2013   MCV 88.9 05/03/2013   PLT 216 05/03/2013   NEUTROABS 2.2 05/03/2013    CEA on 03/29/2013-4.9  Medications: I have reviewed the patient's current medications.  Assessment/Plan: 1. Metastatic colon cancer (T4, N0, M1) adenocarcinoma of the sigmoid colon status post sigmoid colectomy and partial right hepatectomy 04/11/2012. K-ras wild-type on the initial codon 12 and 13 testing. K-ras Q61L mutation identified on extended testing. She completed cycle 1 CAPOX beginning 05/25/2012.  She completed cycle 8 CAPOX beginning 10/19/2012.  CEA on 10/19/2012 normal at 0.8.  CEA 6.1 on 01/11/2013  Restaging CT on 01/11/2013 consistent with multiple new pulmonary metastases  Cycle 1 of FOLFIRI/Avastin 01/18/2013; cycle 2 02/01/2013,cycle 3 02/16/2013.  CEA 5.0 on 03/01/2013.  Cycle 4 FOLFIRI/Avastin 03/01/2013.  Cycle 5 FOLFIRI/Avastin 03/15/2013.  Restaging chest CT 03/29/2013 with possible slight decrease in size of a left upper lobe metastasis. Other pulmonary nodules stable.  Cycle 6 FOLFIRI/Avastin  03/29/2013 Cycle 7 of FOLFIRI/Avastin 04/12/2013 2. Microcytic anemia, iron deficiency. Improved.  3. Markedly elevated preoperative CEA. The CEA was mildly elevated on 05/16/2012. The CEA was in normal range at 0.5 on 08/17/2012 and 0.8 on 10/19/2012. Elevated at 6.1 on 01/11/2013. CEA 4.9 on 03/29/2013 4. Status post Port-A-Cath placement 03/14/2012. X-ray showed the Port-A-Cath tip to be in the azygos vein. The left-sided Port-A-Cath was removed and a new right Port-A-Cath was placed on 05/23/2012. 5. 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 6. Hypertension-persistent on amlodipine. Lisinopril 5 mg daily added on 03/01/2013. Lisinopril increased to 10 mg daily on 03/15/2013.  7. Delayed nausea following cycle 5 FOLFIRI/Avastin. Emend was added to the premedication regimen.   Disposition:  [name] appears stable. She will proceed with cycle 8 of FOLFIRI/Avastin today. She will return for an office visit and chemotherapy in 2 weeks.   Linda Coder, MD  05/03/2013  10:58 AM

## 2013-05-04 LAB — CEA: CEA: 6.3 ng/mL — ABNORMAL HIGH (ref 0.0–5.0)

## 2013-05-05 ENCOUNTER — Telehealth: Payer: Self-pay | Admitting: *Deleted

## 2013-05-05 ENCOUNTER — Ambulatory Visit (HOSPITAL_BASED_OUTPATIENT_CLINIC_OR_DEPARTMENT_OTHER): Payer: Medicaid Other

## 2013-05-05 VITALS — BP 123/79 | HR 65 | Temp 98.3°F

## 2013-05-05 DIAGNOSIS — C787 Secondary malignant neoplasm of liver and intrahepatic bile duct: Secondary | ICD-10-CM

## 2013-05-05 DIAGNOSIS — C189 Malignant neoplasm of colon, unspecified: Secondary | ICD-10-CM

## 2013-05-05 DIAGNOSIS — C187 Malignant neoplasm of sigmoid colon: Secondary | ICD-10-CM

## 2013-05-05 MED ORDER — SODIUM CHLORIDE 0.9 % IJ SOLN
10.0000 mL | INTRAMUSCULAR | Status: DC | PRN
  Administered 2013-05-05: 10 mL
  Filled 2013-05-05: qty 10

## 2013-05-05 MED ORDER — HEPARIN SOD (PORK) LOCK FLUSH 100 UNIT/ML IV SOLN
500.0000 [IU] | Freq: Once | INTRAVENOUS | Status: AC | PRN
  Administered 2013-05-05: 500 [IU]
  Filled 2013-05-05: qty 5

## 2013-05-05 NOTE — Telephone Encounter (Signed)
Left message for patient to call back regarding lab results.

## 2013-05-05 NOTE — Telephone Encounter (Signed)
Called and informed patient of lab results and a repeat cea will be done at next visit on 05/17/13.  Per Dr. Benay Spice.  Patient verbalized understanding.

## 2013-05-05 NOTE — Telephone Encounter (Signed)
Message copied by Norma Fredrickson on Fri May 05, 2013 12:27 PM ------      Message from: Brien Few      Created: Fri May 05, 2013 11:55 AM                   ----- Message -----         From: Ladell Pier, MD         Sent: 05/04/2013   8:36 PM           To: Ludwig Lean, RN            Repeat cea next visit ------

## 2013-05-14 ENCOUNTER — Other Ambulatory Visit: Payer: Self-pay | Admitting: Oncology

## 2013-05-17 ENCOUNTER — Ambulatory Visit (HOSPITAL_BASED_OUTPATIENT_CLINIC_OR_DEPARTMENT_OTHER): Payer: Medicaid Other

## 2013-05-17 ENCOUNTER — Other Ambulatory Visit (HOSPITAL_BASED_OUTPATIENT_CLINIC_OR_DEPARTMENT_OTHER): Payer: Medicaid Other

## 2013-05-17 ENCOUNTER — Telehealth: Payer: Self-pay | Admitting: Oncology

## 2013-05-17 ENCOUNTER — Ambulatory Visit (HOSPITAL_BASED_OUTPATIENT_CLINIC_OR_DEPARTMENT_OTHER): Payer: Medicaid Other | Admitting: Nurse Practitioner

## 2013-05-17 ENCOUNTER — Other Ambulatory Visit: Payer: Self-pay | Admitting: Oncology

## 2013-05-17 ENCOUNTER — Other Ambulatory Visit: Payer: Self-pay | Admitting: *Deleted

## 2013-05-17 VITALS — BP 172/96 | HR 72 | Temp 98.0°F | Resp 19 | Ht 71.0 in | Wt 241.3 lb

## 2013-05-17 DIAGNOSIS — C183 Malignant neoplasm of hepatic flexure: Secondary | ICD-10-CM

## 2013-05-17 DIAGNOSIS — C189 Malignant neoplasm of colon, unspecified: Secondary | ICD-10-CM

## 2013-05-17 DIAGNOSIS — I10 Essential (primary) hypertension: Secondary | ICD-10-CM

## 2013-05-17 DIAGNOSIS — C787 Secondary malignant neoplasm of liver and intrahepatic bile duct: Secondary | ICD-10-CM

## 2013-05-17 DIAGNOSIS — I82A19 Acute embolism and thrombosis of unspecified axillary vein: Secondary | ICD-10-CM

## 2013-05-17 DIAGNOSIS — C7800 Secondary malignant neoplasm of unspecified lung: Secondary | ICD-10-CM

## 2013-05-17 DIAGNOSIS — I82B19 Acute embolism and thrombosis of unspecified subclavian vein: Secondary | ICD-10-CM

## 2013-05-17 DIAGNOSIS — Z5111 Encounter for antineoplastic chemotherapy: Secondary | ICD-10-CM

## 2013-05-17 DIAGNOSIS — Z7901 Long term (current) use of anticoagulants: Secondary | ICD-10-CM

## 2013-05-17 LAB — COMPREHENSIVE METABOLIC PANEL (CC13)
ALK PHOS: 68 U/L (ref 40–150)
ALT: 14 U/L (ref 0–55)
AST: 18 U/L (ref 5–34)
Albumin: 3.9 g/dL (ref 3.5–5.0)
Anion Gap: 11 mEq/L (ref 3–11)
BILIRUBIN TOTAL: 0.29 mg/dL (ref 0.20–1.20)
BUN: 11.1 mg/dL (ref 7.0–26.0)
CO2: 24 mEq/L (ref 22–29)
Calcium: 10.1 mg/dL (ref 8.4–10.4)
Chloride: 105 mEq/L (ref 98–109)
Creatinine: 0.8 mg/dL (ref 0.6–1.1)
GLUCOSE: 98 mg/dL (ref 70–140)
Potassium: 3.8 mEq/L (ref 3.5–5.1)
SODIUM: 141 meq/L (ref 136–145)
Total Protein: 7.7 g/dL (ref 6.4–8.3)

## 2013-05-17 LAB — CBC WITH DIFFERENTIAL/PLATELET
BASO%: 0.5 % (ref 0.0–2.0)
Basophils Absolute: 0 10*3/uL (ref 0.0–0.1)
EOS%: 3.1 % (ref 0.0–7.0)
Eosinophils Absolute: 0.2 10*3/uL (ref 0.0–0.5)
HCT: 34.7 % — ABNORMAL LOW (ref 34.8–46.6)
HGB: 11.6 g/dL (ref 11.6–15.9)
LYMPH%: 36.8 % (ref 14.0–49.7)
MCH: 30.1 pg (ref 25.1–34.0)
MCHC: 33.4 g/dL (ref 31.5–36.0)
MCV: 90.1 fL (ref 79.5–101.0)
MONO#: 0.4 10*3/uL (ref 0.1–0.9)
MONO%: 6.4 % (ref 0.0–14.0)
NEUT%: 53.2 % (ref 38.4–76.8)
NEUTROS ABS: 3.4 10*3/uL (ref 1.5–6.5)
PLATELETS: 197 10*3/uL (ref 145–400)
RBC: 3.85 10*6/uL (ref 3.70–5.45)
RDW: 15.8 % — ABNORMAL HIGH (ref 11.2–14.5)
WBC: 6.4 10*3/uL (ref 3.9–10.3)
lymph#: 2.4 10*3/uL (ref 0.9–3.3)

## 2013-05-17 LAB — CEA: CEA: 7.6 ng/mL — ABNORMAL HIGH (ref 0.0–5.0)

## 2013-05-17 LAB — UA PROTEIN, DIPSTICK - CHCC: PROTEIN: NEGATIVE mg/dL

## 2013-05-17 MED ORDER — SODIUM CHLORIDE 0.9 % IV SOLN
2400.0000 mg/m2 | INTRAVENOUS | Status: DC
  Administered 2013-05-17: 5550 mg via INTRAVENOUS
  Filled 2013-05-17: qty 111

## 2013-05-17 MED ORDER — DEXAMETHASONE SODIUM PHOSPHATE 20 MG/5ML IJ SOLN
12.0000 mg | Freq: Once | INTRAMUSCULAR | Status: AC
  Administered 2013-05-17: 12 mg via INTRAVENOUS

## 2013-05-17 MED ORDER — LIDOCAINE-PRILOCAINE 2.5-2.5 % EX CREA: TOPICAL_CREAM | CUTANEOUS | Status: DC | PRN

## 2013-05-17 MED ORDER — PALONOSETRON HCL INJECTION 0.25 MG/5ML
INTRAVENOUS | Status: AC
  Filled 2013-05-17: qty 5

## 2013-05-17 MED ORDER — LEUCOVORIN CALCIUM INJECTION 350 MG
950.0000 mg | Freq: Once | INTRAVENOUS | Status: AC
  Administered 2013-05-17: 950 mg via INTRAVENOUS
  Filled 2013-05-17: qty 47.5

## 2013-05-17 MED ORDER — PALONOSETRON HCL INJECTION 0.25 MG/5ML
0.2500 mg | Freq: Once | INTRAVENOUS | Status: AC
  Administered 2013-05-17: 0.25 mg via INTRAVENOUS

## 2013-05-17 MED ORDER — IRINOTECAN HCL CHEMO INJECTION 100 MG/5ML
420.0000 mg | Freq: Once | INTRAVENOUS | Status: AC
  Administered 2013-05-17: 420 mg via INTRAVENOUS
  Filled 2013-05-17: qty 21

## 2013-05-17 MED ORDER — SODIUM CHLORIDE 0.9 % IV SOLN
Freq: Once | INTRAVENOUS | Status: AC
  Administered 2013-05-17: 12:00:00 via INTRAVENOUS

## 2013-05-17 MED ORDER — FLUOROURACIL CHEMO INJECTION 2.5 GM/50ML
400.0000 mg/m2 | Freq: Once | INTRAVENOUS | Status: AC
  Administered 2013-05-17: 950 mg via INTRAVENOUS
  Filled 2013-05-17: qty 19

## 2013-05-17 MED ORDER — ATROPINE SULFATE 1 MG/ML IJ SOLN
0.5000 mg | Freq: Once | INTRAMUSCULAR | Status: AC | PRN
  Administered 2013-05-17: 0.5 mg via INTRAVENOUS

## 2013-05-17 MED ORDER — SODIUM CHLORIDE 0.9 % IV SOLN
150.0000 mg | Freq: Once | INTRAVENOUS | Status: AC
  Administered 2013-05-17: 150 mg via INTRAVENOUS
  Filled 2013-05-17: qty 5

## 2013-05-17 MED ORDER — DEXAMETHASONE SODIUM PHOSPHATE 20 MG/5ML IJ SOLN
INTRAMUSCULAR | Status: AC
  Filled 2013-05-17: qty 5

## 2013-05-17 MED ORDER — ATROPINE SULFATE 1 MG/ML IJ SOLN
INTRAMUSCULAR | Status: AC
  Filled 2013-05-17: qty 1

## 2013-05-17 NOTE — Patient Instructions (Signed)
[facility] Discharge Instructions for Patients Receiving Chemotherapy  Today you received the following chemotherapy agents Irinotecan/Leucovorin/5FU.  To help prevent nausea and vomiting after your treatment, we encourage you to take your nausea medication as prescribed.  If you develop nausea and vomiting that is not controlled by your nausea medication, call the clinic.   BELOW ARE SYMPTOMS THAT SHOULD BE REPORTED IMMEDIATELY:  *FEVER GREATER THAN 100.5 F  *CHILLS WITH OR WITHOUT FEVER  NAUSEA AND VOMITING THAT IS NOT CONTROLLED WITH YOUR NAUSEA MEDICATION  *UNUSUAL SHORTNESS OF BREATH  *UNUSUAL BRUISING OR BLEEDING  TENDERNESS IN MOUTH AND THROAT WITH OR WITHOUT PRESENCE OF ULCERS  *URINARY PROBLEMS  *BOWEL PROBLEMS  UNUSUAL RASH Items with * indicate a potential emergency and should be followed up as soon as possible.  Feel free to call the clinic you have any questions or concerns. The clinic phone number is [phone].

## 2013-05-17 NOTE — Progress Notes (Signed)
OFFICE PROGRESS NOTE  Interval history:  [name] returns for followup of metastatic colon cancer. She completed cycle 8 FOLFIRI/Avastin on 05/03/2013. She denies nausea/vomiting. No mouth sores. No diarrhea. She notes darkening of the skin on her hands. No redness or pain. She has a good appetite. She reports her weight is stable. She denies abdominal pain. No bleeding. No shortness of breath or chest pain. She denies leg swelling and calf pain.   Objective: Filed Vitals:   05/17/13 1021  BP: 172/96  Pulse: 72  Temp: 98 F (36.7 C)  Resp: 19   Oropharynx is without thrush or ulceration. Lungs are clear. Regular cardiac rhythm. Port-A-Cath site without erythema. Abdomen soft and nontender. No hepatomegaly. No leg edema. Motor strength 5 over 5. Alert and oriented.   Lab Results: Lab Results  Component Value Date   WBC 6.4 05/17/2013   HGB 11.6 05/17/2013   HCT 34.7* 05/17/2013   MCV 90.1 05/17/2013   PLT 197 05/17/2013   NEUTROABS 3.4 05/17/2013    Chemistry:    Chemistry      Component Value Date/Time   NA 141 05/17/2013 0943   NA 137 02/15/2013 1200   K 3.8 05/17/2013 0943   K 3.4* 02/15/2013 1200   CL 103 02/15/2013 1200   CL 105 10/19/2012 0928   CO2 24 05/17/2013 0943   CO2 24 02/15/2013 1200   BUN 11.1 05/17/2013 0943   BUN 10 02/15/2013 1200   CREATININE 0.8 05/17/2013 0943   CREATININE 0.82 02/15/2013 1200   CREATININE 0.89 03/02/2012 1524      Component Value Date/Time   CALCIUM 10.1 05/17/2013 0943   CALCIUM 9.9 02/15/2013 1200   ALKPHOS 68 05/17/2013 0943   ALKPHOS 73 02/15/2013 1200   AST 18 05/17/2013 0943   AST 18 02/15/2013 1200   ALT 14 05/17/2013 0943   ALT 15 02/15/2013 1200   BILITOT 0.29 05/17/2013 0943   BILITOT 0.3 02/15/2013 1200       Studies/Results: No results found.  Medications: I have reviewed the patient's current medications.  Assessment/Plan: 1. Metastatic colon cancer (T4, N0, M1) adenocarcinoma of the sigmoid colon status post  sigmoid colectomy and partial right hepatectomy 04/11/2012. K-ras wild-type on the initial codon 12 and 13 testing. K-ras Q61L mutation identified on extended testing. She completed cycle 1 CAPOX beginning 05/25/2012.  She completed cycle 8 CAPOX beginning 10/19/2012.  CEA on 10/19/2012 normal at 0.8.  CEA 6.1 on 01/11/2013  Restaging CT on 01/11/2013 consistent with multiple new pulmonary metastases  Cycle 1 of FOLFIRI/Avastin 01/18/2013; cycle 2 02/01/2013,cycle 3 02/16/2013.  CEA 5.0 on 03/01/2013.  Cycle 4 FOLFIRI/Avastin 03/01/2013.  Cycle 5 FOLFIRI/Avastin 03/15/2013.  Restaging chest CT 03/29/2013 with possible slight decrease in size of a left upper lobe metastasis. Other pulmonary nodules stable.  Cycle 6 FOLFIRI/Avastin 03/29/2013  Cycle 7 of FOLFIRI/Avastin 04/12/2013. CEA 6.3 05/03/2013. Cycle 8 FOLFIRI/Avastin 05/03/2013. 2. Microcytic anemia, iron deficiency. Improved.  3. Markedly elevated preoperative CEA. The CEA was mildly elevated on 05/16/2012. The CEA was in normal range at 0.5 on 08/17/2012 and 0.8 on 10/19/2012. Elevated at 6.1 on 01/11/2013. CEA 4.9 on 03/29/2013. CEA 6.3 on 05/03/2013. 4. Status post Port-A-Cath placement 03/14/2012. X-ray showed the Port-A-Cath tip to be in the azygos vein. The left-sided Port-A-Cath was removed and a new right Port-A-Cath was placed on 05/23/2012. 5. 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 6. Hypertension-persistent on amlodipine. Lisinopril 5 mg daily  added on 03/01/2013. Lisinopril increased to 10 mg daily on 03/15/2013. Persistent. 7. Delayed nausea following cycle 5 FOLFIRI/Avastin. Emend was added to the premedication regimen.  Dispositon-she appears stable. Plan to proceed with cycle 9 FOLFIRI today as scheduled. We are holding Avastin due to persistent hypertension. She will continue amlodipine 10 mg daily. She will increase lisinopril to 20 mg daily.  She  will return for a followup visit and cycle 10 on 05/31/2013. The plan is for a restaging CT evaluation following cycle 10.  She will contact the office prior to her next visit with any problems.  Plan reviewed with Dr. Benay Spice.   Ned Card ANP/GNP-BC

## 2013-05-17 NOTE — Telephone Encounter (Signed)
gv adn printed appt sched and avs for pt for Feb...sed added tx.

## 2013-05-17 NOTE — Patient Instructions (Signed)
INCREASE LISINOPRIL TO 20 MG DAILY

## 2013-05-19 ENCOUNTER — Ambulatory Visit (HOSPITAL_BASED_OUTPATIENT_CLINIC_OR_DEPARTMENT_OTHER): Payer: Medicaid Other

## 2013-05-19 VITALS — BP 148/76 | HR 68 | Temp 97.8°F

## 2013-05-19 DIAGNOSIS — C787 Secondary malignant neoplasm of liver and intrahepatic bile duct: Secondary | ICD-10-CM

## 2013-05-19 DIAGNOSIS — C189 Malignant neoplasm of colon, unspecified: Secondary | ICD-10-CM

## 2013-05-19 DIAGNOSIS — C183 Malignant neoplasm of hepatic flexure: Secondary | ICD-10-CM

## 2013-05-19 MED ORDER — HEPARIN SOD (PORK) LOCK FLUSH 100 UNIT/ML IV SOLN
500.0000 [IU] | Freq: Once | INTRAVENOUS | Status: AC | PRN
  Administered 2013-05-19: 500 [IU]
  Filled 2013-05-19: qty 5

## 2013-05-19 MED ORDER — SODIUM CHLORIDE 0.9 % IJ SOLN
10.0000 mL | INTRAMUSCULAR | Status: DC | PRN
  Administered 2013-05-19: 10 mL
  Filled 2013-05-19: qty 10

## 2013-05-21 ENCOUNTER — Other Ambulatory Visit: Payer: Self-pay | Admitting: Oncology

## 2013-05-21 DIAGNOSIS — C189 Malignant neoplasm of colon, unspecified: Secondary | ICD-10-CM

## 2013-05-22 ENCOUNTER — Other Ambulatory Visit: Payer: Self-pay | Admitting: *Deleted

## 2013-05-22 MED ORDER — RIVAROXABAN 20 MG PO TABS: 20.0000 mg | ORAL_TABLET | Freq: Every day | ORAL | Status: DC

## 2013-05-22 NOTE — Telephone Encounter (Signed)
Patient left VM requesting refill on Xarelto

## 2013-05-28 ENCOUNTER — Other Ambulatory Visit: Payer: Self-pay | Admitting: Oncology

## 2013-05-31 ENCOUNTER — Other Ambulatory Visit (HOSPITAL_BASED_OUTPATIENT_CLINIC_OR_DEPARTMENT_OTHER): Payer: Medicaid Other

## 2013-05-31 ENCOUNTER — Ambulatory Visit (HOSPITAL_BASED_OUTPATIENT_CLINIC_OR_DEPARTMENT_OTHER): Payer: Medicaid Other

## 2013-05-31 ENCOUNTER — Telehealth: Payer: Self-pay | Admitting: Oncology

## 2013-05-31 ENCOUNTER — Ambulatory Visit (HOSPITAL_BASED_OUTPATIENT_CLINIC_OR_DEPARTMENT_OTHER): Payer: Medicaid Other | Admitting: Oncology

## 2013-05-31 ENCOUNTER — Encounter (INDEPENDENT_AMBULATORY_CARE_PROVIDER_SITE_OTHER): Payer: Self-pay

## 2013-05-31 VITALS — BP 158/87 | HR 71 | Temp 97.6°F | Resp 18 | Ht 71.0 in | Wt 240.0 lb

## 2013-05-31 VITALS — BP 143/71 | HR 62

## 2013-05-31 DIAGNOSIS — C189 Malignant neoplasm of colon, unspecified: Secondary | ICD-10-CM

## 2013-05-31 DIAGNOSIS — C787 Secondary malignant neoplasm of liver and intrahepatic bile duct: Principal | ICD-10-CM

## 2013-05-31 DIAGNOSIS — C7800 Secondary malignant neoplasm of unspecified lung: Secondary | ICD-10-CM

## 2013-05-31 DIAGNOSIS — C187 Malignant neoplasm of sigmoid colon: Secondary | ICD-10-CM

## 2013-05-31 DIAGNOSIS — Z5111 Encounter for antineoplastic chemotherapy: Secondary | ICD-10-CM

## 2013-05-31 DIAGNOSIS — Z5112 Encounter for antineoplastic immunotherapy: Secondary | ICD-10-CM

## 2013-05-31 LAB — CBC WITH DIFFERENTIAL/PLATELET
BASO%: 0.2 % (ref 0.0–2.0)
Basophils Absolute: 0 10*3/uL (ref 0.0–0.1)
EOS%: 4.1 % (ref 0.0–7.0)
Eosinophils Absolute: 0.3 10*3/uL (ref 0.0–0.5)
HEMATOCRIT: 35.9 % (ref 34.8–46.6)
HGB: 11.9 g/dL (ref 11.6–15.9)
LYMPH#: 2.2 10*3/uL (ref 0.9–3.3)
LYMPH%: 31.3 % (ref 14.0–49.7)
MCH: 30.4 pg (ref 25.1–34.0)
MCHC: 33.2 g/dL (ref 31.5–36.0)
MCV: 91.7 fL (ref 79.5–101.0)
MONO#: 0.4 10*3/uL (ref 0.1–0.9)
MONO%: 5.9 % (ref 0.0–14.0)
NEUT%: 58.5 % (ref 38.4–76.8)
NEUTROS ABS: 4.1 10*3/uL (ref 1.5–6.5)
PLATELETS: 201 10*3/uL (ref 145–400)
RBC: 3.92 10*6/uL (ref 3.70–5.45)
RDW: 16.9 % — ABNORMAL HIGH (ref 11.2–14.5)
WBC: 7 10*3/uL (ref 3.9–10.3)

## 2013-05-31 LAB — COMPREHENSIVE METABOLIC PANEL (CC13)
ALT: 21 U/L (ref 0–55)
ANION GAP: 10 meq/L (ref 3–11)
AST: 19 U/L (ref 5–34)
Albumin: 4.3 g/dL (ref 3.5–5.0)
Alkaline Phosphatase: 80 U/L (ref 40–150)
BUN: 15.3 mg/dL (ref 7.0–26.0)
CALCIUM: 10.3 mg/dL (ref 8.4–10.4)
CO2: 24 meq/L (ref 22–29)
Chloride: 104 mEq/L (ref 98–109)
Creatinine: 0.9 mg/dL (ref 0.6–1.1)
Glucose: 95 mg/dl (ref 70–140)
Potassium: 3.8 mEq/L (ref 3.5–5.1)
SODIUM: 139 meq/L (ref 136–145)
TOTAL PROTEIN: 8 g/dL (ref 6.4–8.3)
Total Bilirubin: 0.58 mg/dL (ref 0.20–1.20)

## 2013-05-31 LAB — UA PROTEIN, DIPSTICK - CHCC: Protein, ur: NEGATIVE mg/dL

## 2013-05-31 MED ORDER — SODIUM CHLORIDE 0.9 % IJ SOLN
10.0000 mL | INTRAMUSCULAR | Status: DC | PRN
  Filled 2013-05-31: qty 10

## 2013-05-31 MED ORDER — PALONOSETRON HCL INJECTION 0.25 MG/5ML
0.2500 mg | Freq: Once | INTRAVENOUS | Status: AC
  Administered 2013-05-31: 0.25 mg via INTRAVENOUS

## 2013-05-31 MED ORDER — FLUOROURACIL CHEMO INJECTION 2.5 GM/50ML
400.0000 mg/m2 | Freq: Once | INTRAVENOUS | Status: AC
  Administered 2013-05-31: 950 mg via INTRAVENOUS
  Filled 2013-05-31: qty 19

## 2013-05-31 MED ORDER — DEXAMETHASONE SODIUM PHOSPHATE 20 MG/5ML IJ SOLN
INTRAMUSCULAR | Status: AC
  Filled 2013-05-31: qty 5

## 2013-05-31 MED ORDER — SODIUM CHLORIDE 0.9 % IV SOLN
Freq: Once | INTRAVENOUS | Status: AC
  Administered 2013-05-31: 11:00:00 via INTRAVENOUS

## 2013-05-31 MED ORDER — SODIUM CHLORIDE 0.9 % IV SOLN
150.0000 mg | Freq: Once | INTRAVENOUS | Status: AC
  Administered 2013-05-31: 150 mg via INTRAVENOUS
  Filled 2013-05-31: qty 5

## 2013-05-31 MED ORDER — HEPARIN SOD (PORK) LOCK FLUSH 100 UNIT/ML IV SOLN
500.0000 [IU] | Freq: Once | INTRAVENOUS | Status: DC | PRN
  Filled 2013-05-31: qty 5

## 2013-05-31 MED ORDER — SODIUM CHLORIDE 0.9 % IV SOLN
5.0000 mg/kg | Freq: Once | INTRAVENOUS | Status: AC
  Administered 2013-05-31: 525 mg via INTRAVENOUS
  Filled 2013-05-31: qty 21

## 2013-05-31 MED ORDER — ATROPINE SULFATE 1 MG/ML IJ SOLN
0.5000 mg | Freq: Once | INTRAMUSCULAR | Status: AC | PRN
  Administered 2013-05-31: 0.5 mg via INTRAVENOUS

## 2013-05-31 MED ORDER — ATROPINE SULFATE 1 MG/ML IJ SOLN
INTRAMUSCULAR | Status: AC
  Filled 2013-05-31: qty 1

## 2013-05-31 MED ORDER — SODIUM CHLORIDE 0.9 % IV SOLN
2400.0000 mg/m2 | INTRAVENOUS | Status: DC
  Administered 2013-05-31: 5550 mg via INTRAVENOUS
  Filled 2013-05-31: qty 111

## 2013-05-31 MED ORDER — DEXAMETHASONE SODIUM PHOSPHATE 20 MG/5ML IJ SOLN
12.0000 mg | Freq: Once | INTRAMUSCULAR | Status: AC
  Administered 2013-05-31: 12 mg via INTRAVENOUS

## 2013-05-31 MED ORDER — IRINOTECAN HCL CHEMO INJECTION 100 MG/5ML
180.0000 mg/m2 | Freq: Once | INTRAVENOUS | Status: AC
  Administered 2013-05-31: 420 mg via INTRAVENOUS
  Filled 2013-05-31: qty 21

## 2013-05-31 MED ORDER — PALONOSETRON HCL INJECTION 0.25 MG/5ML
INTRAVENOUS | Status: AC
  Filled 2013-05-31: qty 5

## 2013-05-31 MED ORDER — LEUCOVORIN CALCIUM INJECTION 350 MG
950.0000 mg | Freq: Once | INTRAVENOUS | Status: AC
  Administered 2013-05-31: 950 mg via INTRAVENOUS
  Filled 2013-05-31: qty 47.5

## 2013-05-31 NOTE — Patient Instructions (Signed)
[facility] Discharge Instructions for Patients Receiving Chemotherapy  Today you received the following chemotherapy agents: Avastin, Leucovorin, Irinotecan, 5FU (Adrucil)  To help prevent nausea and vomiting after your treatment, we encourage you to take your nausea medication as prescribed by your physician: Compazine 10 mg every 6 hrs as needed.   If you develop nausea and vomiting that is not controlled by your nausea medication, call the clinic.   BELOW ARE SYMPTOMS THAT SHOULD BE REPORTED IMMEDIATELY:  *FEVER GREATER THAN 100.5 F  *CHILLS WITH OR WITHOUT FEVER  NAUSEA AND VOMITING THAT IS NOT CONTROLLED WITH YOUR NAUSEA MEDICATION  *UNUSUAL SHORTNESS OF BREATH  *UNUSUAL BRUISING OR BLEEDING  TENDERNESS IN MOUTH AND THROAT WITH OR WITHOUT PRESENCE OF ULCERS  *URINARY PROBLEMS  *BOWEL PROBLEMS  UNUSUAL RASH Items with * indicate a potential emergency and should be followed up as soon as possible.  Feel free to call the clinic you have any questions or concerns. The clinic phone number is [phone].

## 2013-05-31 NOTE — Telephone Encounter (Signed)
gv and printed appt sched and avs forpt for Feb and March....sed added tx.

## 2013-05-31 NOTE — Progress Notes (Signed)
Per pharmacy, okay to tx patient with Avastin.

## 2013-05-31 NOTE — Progress Notes (Signed)
   [place]    OFFICE PROGRESS NOTE   INTERVAL HISTORY:   Ms. Busker returns for scheduled followup of metastatic colon cancer. She completed another cycle of FOLFIRI/Avastin01/21/2015. She feels well. No nausea/vomiting, mouth sores, or diarrhea. Good appetite.mild numbness in the feet.  Objective:  Vital signs in last 24 hours:  Blood pressure 158/87, pulse 71, temperature 97.6 F (36.4 C), temperature source Oral, resp. rate 18, height $RemoveBe'5\' 11"'NaNOeFCXR$  (1.803 m), weight 240 lb (108.863 kg), SpO2 99.00%.    HEENT: no thrush or ulcers Resp: lungs clear bilaterally Cardio: regular rate and rhythm GI: no hepatomegaly, nontender Vascular: no leg edema  Skin:hyperpigmentation over the hands   Portacath/PICC-without erythema  Lab Results:  Lab Results  Component Value Date   WBC 7.0 05/31/2013   HGB 11.9 05/31/2013   HCT 35.9 05/31/2013   MCV 91.7 05/31/2013   PLT 201 05/31/2013   NEUTROABS 4.1 05/31/2013   CEA on 05/17/2013-7.6   Medications: I have reviewed the patient's current medications.  Assessment/Plan: 1. Metastatic colon cancer (T4, N0, M1) adenocarcinoma of the sigmoid colon status post sigmoid colectomy and partial right hepatectomy 04/11/2012. K-ras wild-type on the initial codon 12 and 13 testing. K-ras Q61L mutation identified on extended testing. She completed cycle 1 CAPOX beginning 05/25/2012.  She completed cycle 8 CAPOX beginning 10/19/2012.  CEA on 10/19/2012 normal at 0.8.  CEA 6.1 on 01/11/2013  Restaging CT on 01/11/2013 consistent with multiple new pulmonary metastases  Cycle 1 of FOLFIRI/Avastin 01/18/2013; cycle 2 02/01/2013,cycle 3 02/16/2013.  CEA 5.0 on 03/01/2013.  Cycle 4 FOLFIRI/Avastin 03/01/2013.  Cycle 5 FOLFIRI/Avastin 03/15/2013.  Restaging chest CT 03/29/2013 with possible slight decrease in size of a left upper lobe metastasis. Other pulmonary nodules stable.  Cycle 6 FOLFIRI/Avastin 03/29/2013  Cycle 7 of FOLFIRI/Avastin  04/12/2013.  CEA 6.3 05/03/2013.  Cycle 8 FOLFIRI/Avastin 05/03/2013. Cycle 9 FOLFIRI/Avastin 05/17/2013 2. Microcytic anemia, iron deficiency. Improved.  3. Markedly elevated preoperative CEA. The CEA was mildly elevated on 05/16/2012. The CEA was in normal range at 0.5 on 08/17/2012 and 0.8 on 10/19/2012. Elevated at 6.1 on 01/11/2013.  4. Status post Port-A-Cath placement 03/14/2012. X-ray showed the Port-A-Cath tip to be in the azygos vein. The left-sided Port-A-Cath was removed and a new right Port-A-Cath was placed on 05/23/2012. 5. 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 6. Hypertension-persistent on amlodipine. Lisinopril 5 mg daily added on 03/01/2013. Lisinopril increased to 10 mg daily on 03/15/2013. Persistent. 7. Delayed nausea following cycle 5 FOLFIRI/Avastin. Emend was added to the premedication regimen.  Disposition:  [name] appears stable. The CEA was slightly higher on 05/17/2013. We checked a CEA today. The plan is to complete cycle 10 FOLFIRI/Avastin today. She will return for an office visit and restaging CT evaluation in 2 weeks.   Betsy Coder, MD  05/31/2013  10:02 AM

## 2013-06-01 LAB — CEA: CEA: 8.8 ng/mL — ABNORMAL HIGH (ref 0.0–5.0)

## 2013-06-02 ENCOUNTER — Ambulatory Visit (HOSPITAL_BASED_OUTPATIENT_CLINIC_OR_DEPARTMENT_OTHER): Payer: Medicaid Other

## 2013-06-02 VITALS — BP 138/87 | HR 83 | Temp 97.1°F

## 2013-06-02 DIAGNOSIS — C787 Secondary malignant neoplasm of liver and intrahepatic bile duct: Secondary | ICD-10-CM

## 2013-06-02 DIAGNOSIS — C187 Malignant neoplasm of sigmoid colon: Secondary | ICD-10-CM

## 2013-06-02 DIAGNOSIS — C189 Malignant neoplasm of colon, unspecified: Secondary | ICD-10-CM

## 2013-06-02 MED ORDER — HEPARIN SOD (PORK) LOCK FLUSH 100 UNIT/ML IV SOLN
500.0000 [IU] | Freq: Once | INTRAVENOUS | Status: AC | PRN
  Administered 2013-06-02: 500 [IU]
  Filled 2013-06-02: qty 5

## 2013-06-02 MED ORDER — SODIUM CHLORIDE 0.9 % IJ SOLN
10.0000 mL | INTRAMUSCULAR | Status: DC | PRN
  Administered 2013-06-02: 10 mL
  Filled 2013-06-02: qty 10

## 2013-06-11 ENCOUNTER — Other Ambulatory Visit: Payer: Self-pay | Admitting: Oncology

## 2013-06-13 ENCOUNTER — Other Ambulatory Visit: Payer: Self-pay | Admitting: *Deleted

## 2013-06-13 ENCOUNTER — Ambulatory Visit (HOSPITAL_COMMUNITY)
Admission: RE | Admit: 2013-06-13 | Discharge: 2013-06-13 | Disposition: A | Discharge status: Home / Self Care | Payer: Medicaid Other | Source: Ambulatory Visit | Attending: Oncology | Admitting: Oncology

## 2013-06-14 ENCOUNTER — Other Ambulatory Visit: Payer: Medicaid Other

## 2013-06-14 ENCOUNTER — Ambulatory Visit: Payer: Medicaid Other | Admitting: Oncology

## 2013-06-14 ENCOUNTER — Ambulatory Visit: Payer: Medicaid Other | Admitting: Nurse Practitioner

## 2013-06-14 ENCOUNTER — Ambulatory Visit: Payer: Medicaid Other

## 2013-06-14 ENCOUNTER — Telehealth: Payer: Self-pay | Admitting: *Deleted

## 2013-06-14 NOTE — Telephone Encounter (Signed)
Received messages re: pt not able to get transportation for CT 06/13/13 or lab/Lisa/treatment for 06/14/13.  Called pt back @ # left on message [phone]; friend's phone) because pt states "I'm out of minutes on my phone"  Informed pt that CT has been re-scheduled for Friday, 06/16/13 at 2:30 and to be here at 2pm.  Pt stated she would contact [facility] transportation and let them know appt date/time.  Instructed pt that schedulers will call regarding re-scheduling appt missed 06/14/13 later this week.  Pt states she would like to get new appt Friday when she is here because of the difficulty she is having with her phone.  POF completed and schedulers made aware.

## 2013-06-14 NOTE — Telephone Encounter (Signed)
Called patient twice and received automatic message that "person at this number is currently unavailable".  Now note collaborative nurse has documented no  Transportation for today's f/u visit.  Called [place] and notified her of this.  CT will wait for this office to reschedule CT scans.

## 2013-06-14 NOTE — Telephone Encounter (Signed)
[name] called reporting the CT chest/abdomen that was missed yesterday can be done today after avastin/FOLFIRI infusion appointment.  If patient would like this done call [name] at [phone] as soon as we know.  Otherwise call to reschedule for a different day.  Will notify providers.

## 2013-06-14 NOTE — Telephone Encounter (Signed)
Attempted to reach patient without success.   Unable to leave a voice message.

## 2013-06-15 ENCOUNTER — Telehealth: Payer: Self-pay | Admitting: Oncology

## 2013-06-15 ENCOUNTER — Ambulatory Visit (HOSPITAL_COMMUNITY): Payer: Medicaid Other

## 2013-06-15 NOTE — Telephone Encounter (Signed)
, °

## 2013-06-16 ENCOUNTER — Other Ambulatory Visit: Payer: Self-pay | Admitting: *Deleted

## 2013-06-16 ENCOUNTER — Telehealth: Payer: Self-pay | Admitting: *Deleted

## 2013-06-16 ENCOUNTER — Ambulatory Visit (HOSPITAL_COMMUNITY): Admission: RE | Admit: 2013-06-16 | Payer: Medicaid Other | Source: Ambulatory Visit

## 2013-06-16 ENCOUNTER — Ambulatory Visit: Payer: Medicaid Other | Admitting: Nurse Practitioner

## 2013-06-16 NOTE — Telephone Encounter (Signed)
Per staff message and POF I have scheduled appts.  JMW  

## 2013-06-16 NOTE — Telephone Encounter (Signed)
Called patient with new schedule.  Lab on Mon 2/23 at 2:45, CT Scan at 3:30.  Office visit with Lattie Haw on Wed 2/25 at 18 and treatment after.  Patient verbalized understanding.

## 2013-06-19 ENCOUNTER — Other Ambulatory Visit (HOSPITAL_BASED_OUTPATIENT_CLINIC_OR_DEPARTMENT_OTHER): Payer: Medicaid Other

## 2013-06-19 ENCOUNTER — Ambulatory Visit: Payer: Medicaid Other | Admitting: Nurse Practitioner

## 2013-06-19 ENCOUNTER — Telehealth: Payer: Self-pay | Admitting: Oncology

## 2013-06-19 ENCOUNTER — Ambulatory Visit (HOSPITAL_COMMUNITY)
Admission: RE | Admit: 2013-06-19 | Discharge: 2013-06-19 | Disposition: A | Discharge status: Home / Self Care | Payer: Medicaid Other | Source: Ambulatory Visit | Attending: Oncology | Admitting: Oncology

## 2013-06-19 DIAGNOSIS — C787 Secondary malignant neoplasm of liver and intrahepatic bile duct: Secondary | ICD-10-CM

## 2013-06-19 DIAGNOSIS — C189 Malignant neoplasm of colon, unspecified: Secondary | ICD-10-CM

## 2013-06-19 DIAGNOSIS — R911 Solitary pulmonary nodule: Secondary | ICD-10-CM | POA: Insufficient documentation

## 2013-06-19 DIAGNOSIS — C7800 Secondary malignant neoplasm of unspecified lung: Secondary | ICD-10-CM | POA: Insufficient documentation

## 2013-06-19 LAB — CBC WITH DIFFERENTIAL/PLATELET
BASO%: 1 % (ref 0.0–2.0)
BASOS ABS: 0 10*3/uL (ref 0.0–0.1)
EOS ABS: 0.1 10*3/uL (ref 0.0–0.5)
EOS%: 3 % (ref 0.0–7.0)
HEMATOCRIT: 36.2 % (ref 34.8–46.6)
HEMOGLOBIN: 11.9 g/dL (ref 11.6–15.9)
LYMPH#: 1.9 10*3/uL (ref 0.9–3.3)
LYMPH%: 46.4 % (ref 14.0–49.7)
MCH: 30 pg (ref 25.1–34.0)
MCHC: 32.9 g/dL (ref 31.5–36.0)
MCV: 91.2 fL (ref 79.5–101.0)
MONO#: 0.3 10*3/uL (ref 0.1–0.9)
MONO%: 8.2 % (ref 0.0–14.0)
NEUT#: 1.7 10*3/uL (ref 1.5–6.5)
NEUT%: 41.4 % (ref 38.4–76.8)
Platelets: 206 10*3/uL (ref 145–400)
RBC: 3.97 10*6/uL (ref 3.70–5.45)
RDW: 15.7 % — ABNORMAL HIGH (ref 11.2–14.5)
WBC: 4 10*3/uL (ref 3.9–10.3)
nRBC: 0 % (ref 0–0)

## 2013-06-19 LAB — COMPREHENSIVE METABOLIC PANEL (CC13)
ALBUMIN: 4.2 g/dL (ref 3.5–5.0)
ALT: 20 U/L (ref 0–55)
ANION GAP: 11 meq/L (ref 3–11)
AST: 24 U/L (ref 5–34)
Alkaline Phosphatase: 71 U/L (ref 40–150)
BUN: 14.2 mg/dL (ref 7.0–26.0)
CHLORIDE: 106 meq/L (ref 98–109)
CO2: 23 meq/L (ref 22–29)
CREATININE: 0.9 mg/dL (ref 0.6–1.1)
Calcium: 10.2 mg/dL (ref 8.4–10.4)
GLUCOSE: 86 mg/dL (ref 70–140)
POTASSIUM: 3.8 meq/L (ref 3.5–5.1)
Sodium: 140 mEq/L (ref 136–145)
Total Bilirubin: 0.57 mg/dL (ref 0.20–1.20)
Total Protein: 8 g/dL (ref 6.4–8.3)

## 2013-06-19 MED ORDER — IOHEXOL 300 MG/ML  SOLN
80.0000 mL | Freq: Once | INTRAMUSCULAR | Status: AC | PRN
  Administered 2013-06-19: 80 mL via INTRAVENOUS

## 2013-06-19 NOTE — Telephone Encounter (Signed)
Called pt and left message regarding lab,ML and chemo for 2/25

## 2013-06-20 LAB — CEA: CEA: 10.6 ng/mL — ABNORMAL HIGH (ref 0.0–5.0)

## 2013-06-21 ENCOUNTER — Telehealth: Payer: Self-pay | Admitting: *Deleted

## 2013-06-21 ENCOUNTER — Encounter: Payer: Self-pay | Admitting: *Deleted

## 2013-06-21 ENCOUNTER — Ambulatory Visit (HOSPITAL_BASED_OUTPATIENT_CLINIC_OR_DEPARTMENT_OTHER): Payer: Medicaid Other

## 2013-06-21 ENCOUNTER — Other Ambulatory Visit: Payer: Medicaid Other

## 2013-06-21 ENCOUNTER — Telehealth: Payer: Self-pay | Admitting: Oncology

## 2013-06-21 ENCOUNTER — Ambulatory Visit (HOSPITAL_BASED_OUTPATIENT_CLINIC_OR_DEPARTMENT_OTHER): Payer: Medicaid Other | Admitting: Nurse Practitioner

## 2013-06-21 ENCOUNTER — Encounter: Payer: Self-pay | Admitting: Specialist

## 2013-06-21 VITALS — BP 149/79 | HR 53

## 2013-06-21 VITALS — BP 157/78 | HR 66 | Temp 97.4°F | Resp 18 | Ht 71.0 in | Wt 240.7 lb

## 2013-06-21 DIAGNOSIS — C7800 Secondary malignant neoplasm of unspecified lung: Secondary | ICD-10-CM

## 2013-06-21 DIAGNOSIS — C187 Malignant neoplasm of sigmoid colon: Secondary | ICD-10-CM

## 2013-06-21 DIAGNOSIS — Z86718 Personal history of other venous thrombosis and embolism: Secondary | ICD-10-CM

## 2013-06-21 DIAGNOSIS — C189 Malignant neoplasm of colon, unspecified: Secondary | ICD-10-CM

## 2013-06-21 DIAGNOSIS — D509 Iron deficiency anemia, unspecified: Secondary | ICD-10-CM

## 2013-06-21 DIAGNOSIS — Z5111 Encounter for antineoplastic chemotherapy: Secondary | ICD-10-CM

## 2013-06-21 DIAGNOSIS — C787 Secondary malignant neoplasm of liver and intrahepatic bile duct: Principal | ICD-10-CM

## 2013-06-21 DIAGNOSIS — Z7901 Long term (current) use of anticoagulants: Secondary | ICD-10-CM

## 2013-06-21 MED ORDER — PALONOSETRON HCL INJECTION 0.25 MG/5ML
0.2500 mg | Freq: Once | INTRAVENOUS | Status: AC
  Administered 2013-06-21: 0.25 mg via INTRAVENOUS

## 2013-06-21 MED ORDER — LEUCOVORIN CALCIUM INJECTION 350 MG
950.0000 mg | Freq: Once | INTRAVENOUS | Status: AC
  Administered 2013-06-21: 950 mg via INTRAVENOUS
  Filled 2013-06-21: qty 47.5

## 2013-06-21 MED ORDER — DEXAMETHASONE SODIUM PHOSPHATE 20 MG/5ML IJ SOLN
INTRAMUSCULAR | Status: AC
  Filled 2013-06-21: qty 5

## 2013-06-21 MED ORDER — ATROPINE SULFATE 1 MG/ML IJ SOLN
0.5000 mg | Freq: Once | INTRAMUSCULAR | Status: AC | PRN
  Administered 2013-06-21: 0.5 mg via INTRAVENOUS

## 2013-06-21 MED ORDER — SODIUM CHLORIDE 0.9 % IV SOLN
Freq: Once | INTRAVENOUS | Status: AC
  Administered 2013-06-21: 12:00:00 via INTRAVENOUS

## 2013-06-21 MED ORDER — SODIUM CHLORIDE 0.9 % IV SOLN
2400.0000 mg/m2 | INTRAVENOUS | Status: DC
  Administered 2013-06-21: 5550 mg via INTRAVENOUS
  Filled 2013-06-21: qty 111

## 2013-06-21 MED ORDER — SODIUM CHLORIDE 0.9 % IV SOLN
5.0000 mg/kg | Freq: Once | INTRAVENOUS | Status: AC
  Administered 2013-06-21: 525 mg via INTRAVENOUS
  Filled 2013-06-21: qty 21

## 2013-06-21 MED ORDER — DEXAMETHASONE SODIUM PHOSPHATE 20 MG/5ML IJ SOLN
12.0000 mg | Freq: Once | INTRAMUSCULAR | Status: AC
  Administered 2013-06-21: 12 mg via INTRAVENOUS

## 2013-06-21 MED ORDER — PALONOSETRON HCL INJECTION 0.25 MG/5ML
INTRAVENOUS | Status: AC
  Filled 2013-06-21: qty 5

## 2013-06-21 MED ORDER — ATROPINE SULFATE 1 MG/ML IJ SOLN
INTRAMUSCULAR | Status: AC
  Filled 2013-06-21: qty 1

## 2013-06-21 MED ORDER — FLUOROURACIL CHEMO INJECTION 2.5 GM/50ML
400.0000 mg/m2 | Freq: Once | INTRAVENOUS | Status: AC
  Administered 2013-06-21: 950 mg via INTRAVENOUS
  Filled 2013-06-21: qty 19

## 2013-06-21 MED ORDER — SODIUM CHLORIDE 0.9 % IV SOLN
150.0000 mg | Freq: Once | INTRAVENOUS | Status: AC
  Administered 2013-06-21: 150 mg via INTRAVENOUS
  Filled 2013-06-21: qty 5

## 2013-06-21 MED ORDER — IRINOTECAN HCL CHEMO INJECTION 100 MG/5ML
180.0000 mg/m2 | Freq: Once | INTRAVENOUS | Status: AC
  Administered 2013-06-21: 420 mg via INTRAVENOUS
  Filled 2013-06-21: qty 21

## 2013-06-21 NOTE — Telephone Encounter (Signed)
Per staff message and POF I have scheduled appts.  JMW  

## 2013-06-21 NOTE — Telephone Encounter (Signed)
gv and printed appt sched and avs for pt for Feb and March.....MW added tx.

## 2013-06-21 NOTE — Telephone Encounter (Signed)
Called pt reminded of appt for 2/17, advise pt to geta ppt calendar for March 2015

## 2013-06-21 NOTE — Progress Notes (Signed)
SW referral. Met with patient in infusion room. Patient discussed her diagnosis and painful life experiences. Chaplain facilitated her telling of her narratives and helped her re-frame them in a more hopeful way to help the patient draw on her resources of resilience, sense of worth, and faith. Encouraged focus on present and finding purpose in this day.  Epifania Gore, [facility], PhD

## 2013-06-21 NOTE — Patient Instructions (Signed)
[place] Discharge Instructions for Patients Receiving Chemotherapy  Today you received the following chemotherapy agents Avastin, Leucovorin, Camptosar and 5FU.  To help prevent nausea and vomiting after your treatment, we encourage you to take your nausea medication.   If you develop nausea and vomiting that is not controlled by your nausea medication, call the clinic.   BELOW ARE SYMPTOMS THAT SHOULD BE REPORTED IMMEDIATELY:  *FEVER GREATER THAN 100.5 F  *CHILLS WITH OR WITHOUT FEVER  NAUSEA AND VOMITING THAT IS NOT CONTROLLED WITH YOUR NAUSEA MEDICATION  *UNUSUAL SHORTNESS OF BREATH  *UNUSUAL BRUISING OR BLEEDING  TENDERNESS IN MOUTH AND THROAT WITH OR WITHOUT PRESENCE OF ULCERS  *URINARY PROBLEMS  *BOWEL PROBLEMS  UNUSUAL RASH Items with * indicate a potential emergency and should be followed up as soon as possible.  Feel free to call the clinic you have any questions or concerns. The clinic phone number is [phone].

## 2013-06-21 NOTE — Progress Notes (Addendum)
OFFICE PROGRESS NOTE  Interval history:  [name] returns for followup of metastatic colon cancer. She completed cycle 10 FOLFIRI/Avastin on 05/31/2013. She feels she is tolerating the chemotherapy well. She denies nausea/vomiting. No mouth sores. No diarrhea. She continues to have a good appetite. She denies pain. Specifically no abdominal pain. She denies shortness of breath and chest pain. No leg swelling or calf pain. She denies bleeding. She has persistent numbness in both feet.   Objective: Filed Vitals:   06/21/13 1048  BP: 157/78  Pulse: 66  Temp: 97.4 F (36.3 C)  Resp: 18   Oropharynx is without thrush or ulceration. Lungs are clear. Regular cardiac rhythm. Port-A-Cath site without erythema. Abdomen soft and nontender. No hepatomegaly. No leg edema. Calves nontender.   Lab Results: Lab Results  Component Value Date   WBC 4.0 06/19/2013   HGB 11.9 06/19/2013   HCT 36.2 06/19/2013   MCV 91.2 06/19/2013   PLT 206 06/19/2013   NEUTROABS 1.7 06/19/2013    Chemistry:    Chemistry      Component Value Date/Time   NA 140 06/19/2013 1328   NA 137 02/15/2013 1200   K 3.8 06/19/2013 1328   K 3.4* 02/15/2013 1200   CL 103 02/15/2013 1200   CL 105 10/19/2012 0928   CO2 23 06/19/2013 1328   CO2 24 02/15/2013 1200   BUN 14.2 06/19/2013 1328   BUN 10 02/15/2013 1200   CREATININE 0.9 06/19/2013 1328   CREATININE 0.82 02/15/2013 1200   CREATININE 0.89 03/02/2012 1524      Component Value Date/Time   CALCIUM 10.2 06/19/2013 1328   CALCIUM 9.9 02/15/2013 1200   ALKPHOS 71 06/19/2013 1328   ALKPHOS 73 02/15/2013 1200   AST 24 06/19/2013 1328   AST 18 02/15/2013 1200   ALT 20 06/19/2013 1328   ALT 15 02/15/2013 1200   BILITOT 0.57 06/19/2013 1328   BILITOT 0.3 02/15/2013 1200       Studies/Results: Ct Chest W Contrast  06/19/2013   CLINICAL DATA:  Follow-up lung nodules, metastatic colon cancer  EXAM: CT CHEST WITH CONTRAST  TECHNIQUE: Multidetector CT imaging of the chest was  performed during intravenous contrast administration.  CONTRAST:  42mL OMNIPAQUE IOHEXOL 300 MG/ML  SOLN  COMPARISON:  03/29/2013  FINDINGS: Mild progression of bilateral pulmonary nodules/metastases, including:  --5 mm right upper lobe nodule (series 5/ image 16), previously 4 mm  --1.5 x 1.4 cm left upper lobe nodule (series 5/image 17), previously 1.2 x 1.3 cm  --1.3 x 1.3 cm left upper lobe nodule (series 5/image 22), previously 1.2 x 1.2 cm  --6 mm left lower lobe nodule (series 5/image 25), previously 4 mm  --1.5 x 1.3 cm right middle lobe nodule (series 5/image 34), previously 1.0 x 1.3 cm  No pleural effusion or pneumothorax.  Visualized thyroid is unremarkable.  The heart is normal in size. No pericardial effusion. Mild atherosclerotic calcifications of the aortic arch.  Right chest port.  No suspicious mediastinal, hilar, or axillary lymphadenopathy.  Visualized upper abdomen is notable for a partial right hepatectomy and hepatic steatosis.  Degenerative changes of the visualized thoracolumbar spine.  IMPRESSION: Mild progression of bilateral pulmonary nodules/metastases, as described above.   Electronically Signed   By: Julian Hy M.D.   On: 06/19/2013 16:04    Medications: I have reviewed the patient's current medications.  Assessment/Plan: 1. Metastatic colon cancer (T4, N0, M1) adenocarcinoma of the sigmoid colon status post sigmoid colectomy and partial right hepatectomy  04/11/2012. K-ras wild-type on the initial codon 12 and 13 testing. K-ras Q61L mutation identified on extended testing. She completed cycle 1 CAPOX beginning 05/25/2012.  She completed cycle 8 CAPOX beginning 10/19/2012.  CEA on 10/19/2012 normal at 0.8.  CEA 6.1 on 01/11/2013  Restaging CT on 01/11/2013 consistent with multiple new pulmonary metastases  Cycle 1 of FOLFIRI/Avastin 01/18/2013; cycle 2 02/01/2013,cycle 3 02/16/2013.  CEA 5.0 on 03/01/2013.  Cycle 4 FOLFIRI/Avastin 03/01/2013.  Cycle 5  FOLFIRI/Avastin 03/15/2013.  Restaging chest CT 03/29/2013 with possible slight decrease in size of a left upper lobe metastasis. Other pulmonary nodules stable.  Cycle 6 FOLFIRI/Avastin 03/29/2013  Cycle 7 of FOLFIRI/Avastin 04/12/2013.  CEA 6.3 05/03/2013.  Cycle 8 FOLFIRI/Avastin 05/03/2013.  Cycle 9 FOLFIRI/Avastin 05/17/2013. Cycle 10 FOLFIRI/Avastin 05/31/2013. CEA 10.6 on 06/19/2013. CT chest 06/19/2013 with mild progression of bilateral pulmonary nodules/metastases. 2. Microcytic anemia, iron deficiency. Improved.  3. Markedly elevated preoperative CEA. The CEA was mildly elevated on 05/16/2012. The CEA was in normal range at 0.5 on 08/17/2012 and 0.8 on 10/19/2012. Elevated at 6.1 on 01/11/2013.  4. Status post Port-A-Cath placement 03/14/2012. X-ray showed the Port-A-Cath tip to be in the azygos vein. The left-sided Port-A-Cath was removed and a new right Port-A-Cath was placed on 05/23/2012. 5. 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 6. Hypertension-persistent on amlodipine. Lisinopril 5 mg daily added on 03/01/2013. Lisinopril increased to 10 mg daily on 03/15/2013. Persistent. 7. Delayed nausea following cycle 5 FOLFIRI/Avastin. Emend was added to the premedication regimen.  Dispositon-she appears stable. She has completed 10 cycles of FOLFIRI/Avastin. She is tolerating treatment well. The CEA is slowly rising. The recent chest CT shows very mild progression of the lung nodules.  Dr. Benay Spice reviewed the above with [name] at today's visit. Options were discussed including continuation of the current treatment with a repeat chest CT at a 6-8 week interval, referral for a clinical trial, 5-fluorouracil with Aflibercept. Due to transportation issues she declines the option of referral for a clinical trial. It was mutually decided to continue the current treatment and repeat a chest CT at 6-8 weeks.  Plan to  proceed with cycle 11 FOLFIRI/Avastin today as scheduled. She will return for a followup visit and cycle 12 in 2 weeks. She will contact the office in the interim with any problems.  Patient seen with Dr. Benay Spice. 25 minutes were spent face-to-face at today's visit with the majority of that time involved in counseling/coordination of care.   Ned Card ANP/GNP-BC   This was a shared visit with Ned Card.  There is mild progression of the lung nodules on the restaging CT and the CEA is higher. We discussed treatment options with [name]. She progressed shortly after completing CAPOX. Treatment options are limited since her tumor is K-ras mutated. We decided to continue FOLFIRI/Avastin with the limited evidence of disease progression. We can consider 5-FU/Aflibercept if there is clear evidence of disease progression on the next CT. [name] is unable to travel out of town for a clinical trial.  Julieanne Manson, M.D.

## 2013-06-21 NOTE — Progress Notes (Signed)
[facility] Clinical Social Work  Clinical Social Work was referred by nurse at patient request for additional support and assessment of needs due to "bad news today"  Clinical Social Worker met with  patient in chemo room to offer support and assess for needs.  Pt disclosed that her CT had shown some progression and she was struggling with this news. CSW provided supportive listening and emotional support. Pt continues to have good support from her family. CSW discussed and explored with Pt coping techniques and support programs that may provide additional support. Pt is interested in GI Support Group and may attend in March. She was appreciative of CSW visit. CSW will check in at next chemo in two weeks.    Clinical Social Work interventions: Supportive listening and emotional support Support group education  Grier Hock, LCSW Clinical Social Worker [facility] Wednesday, Thursday and Friday Phone: [phone] Fax: [phone]   

## 2013-06-23 ENCOUNTER — Ambulatory Visit (HOSPITAL_BASED_OUTPATIENT_CLINIC_OR_DEPARTMENT_OTHER): Payer: Medicaid Other

## 2013-06-23 VITALS — BP 154/79 | HR 69 | Temp 98.1°F

## 2013-06-23 DIAGNOSIS — C189 Malignant neoplasm of colon, unspecified: Secondary | ICD-10-CM

## 2013-06-23 DIAGNOSIS — C787 Secondary malignant neoplasm of liver and intrahepatic bile duct: Secondary | ICD-10-CM

## 2013-06-23 DIAGNOSIS — C187 Malignant neoplasm of sigmoid colon: Secondary | ICD-10-CM

## 2013-06-23 MED ORDER — HEPARIN SOD (PORK) LOCK FLUSH 100 UNIT/ML IV SOLN
500.0000 [IU] | Freq: Once | INTRAVENOUS | Status: AC | PRN
  Administered 2013-06-23: 500 [IU]
  Filled 2013-06-23: qty 5

## 2013-06-23 MED ORDER — SODIUM CHLORIDE 0.9 % IJ SOLN
10.0000 mL | INTRAMUSCULAR | Status: DC | PRN
  Administered 2013-06-23: 10 mL
  Filled 2013-06-23: qty 10

## 2013-06-28 ENCOUNTER — Ambulatory Visit: Payer: Medicaid Other | Admitting: Oncology

## 2013-06-28 ENCOUNTER — Ambulatory Visit: Payer: Medicaid Other

## 2013-06-28 ENCOUNTER — Other Ambulatory Visit: Payer: Medicaid Other

## 2013-06-30 ENCOUNTER — Other Ambulatory Visit: Payer: Self-pay | Admitting: Oncology

## 2013-07-05 ENCOUNTER — Ambulatory Visit (HOSPITAL_BASED_OUTPATIENT_CLINIC_OR_DEPARTMENT_OTHER): Payer: Medicaid Other | Admitting: Nurse Practitioner

## 2013-07-05 ENCOUNTER — Ambulatory Visit (HOSPITAL_BASED_OUTPATIENT_CLINIC_OR_DEPARTMENT_OTHER): Payer: Medicaid Other

## 2013-07-05 ENCOUNTER — Other Ambulatory Visit (HOSPITAL_BASED_OUTPATIENT_CLINIC_OR_DEPARTMENT_OTHER): Payer: Medicaid Other

## 2013-07-05 ENCOUNTER — Telehealth: Payer: Self-pay | Admitting: Oncology

## 2013-07-05 VITALS — BP 161/88 | HR 72 | Temp 98.6°F | Resp 20 | Ht 71.0 in | Wt 249.7 lb

## 2013-07-05 DIAGNOSIS — C7800 Secondary malignant neoplasm of unspecified lung: Secondary | ICD-10-CM

## 2013-07-05 DIAGNOSIS — D509 Iron deficiency anemia, unspecified: Secondary | ICD-10-CM

## 2013-07-05 DIAGNOSIS — Z5112 Encounter for antineoplastic immunotherapy: Secondary | ICD-10-CM

## 2013-07-05 DIAGNOSIS — C187 Malignant neoplasm of sigmoid colon: Secondary | ICD-10-CM

## 2013-07-05 DIAGNOSIS — C189 Malignant neoplasm of colon, unspecified: Secondary | ICD-10-CM

## 2013-07-05 DIAGNOSIS — Z5111 Encounter for antineoplastic chemotherapy: Secondary | ICD-10-CM

## 2013-07-05 DIAGNOSIS — I82409 Acute embolism and thrombosis of unspecified deep veins of unspecified lower extremity: Secondary | ICD-10-CM

## 2013-07-05 DIAGNOSIS — C787 Secondary malignant neoplasm of liver and intrahepatic bile duct: Secondary | ICD-10-CM

## 2013-07-05 DIAGNOSIS — I82B19 Acute embolism and thrombosis of unspecified subclavian vein: Secondary | ICD-10-CM

## 2013-07-05 DIAGNOSIS — R110 Nausea: Secondary | ICD-10-CM

## 2013-07-05 DIAGNOSIS — I10 Essential (primary) hypertension: Secondary | ICD-10-CM

## 2013-07-05 LAB — CBC WITH DIFFERENTIAL/PLATELET
BASO%: 0.8 % (ref 0.0–2.0)
Basophils Absolute: 0 10*3/uL (ref 0.0–0.1)
EOS ABS: 0.2 10*3/uL (ref 0.0–0.5)
EOS%: 2.9 % (ref 0.0–7.0)
HCT: 34.1 % — ABNORMAL LOW (ref 34.8–46.6)
HGB: 11.2 g/dL — ABNORMAL LOW (ref 11.6–15.9)
LYMPH%: 33.8 % (ref 14.0–49.7)
MCH: 29.9 pg (ref 25.1–34.0)
MCHC: 32.8 g/dL (ref 31.5–36.0)
MCV: 90.9 fL (ref 79.5–101.0)
MONO#: 0.4 10*3/uL (ref 0.1–0.9)
MONO%: 7.4 % (ref 0.0–14.0)
NEUT%: 55.1 % (ref 38.4–76.8)
NEUTROS ABS: 2.8 10*3/uL (ref 1.5–6.5)
PLATELETS: 161 10*3/uL (ref 145–400)
RBC: 3.75 10*6/uL (ref 3.70–5.45)
RDW: 15.6 % — AB (ref 11.2–14.5)
WBC: 5.2 10*3/uL (ref 3.9–10.3)
lymph#: 1.7 10*3/uL (ref 0.9–3.3)

## 2013-07-05 LAB — COMPREHENSIVE METABOLIC PANEL (CC13)
ALBUMIN: 4.1 g/dL (ref 3.5–5.0)
ALK PHOS: 72 U/L (ref 40–150)
ALT: 32 U/L (ref 0–55)
AST: 29 U/L (ref 5–34)
Anion Gap: 10 mEq/L (ref 3–11)
BILIRUBIN TOTAL: 0.56 mg/dL (ref 0.20–1.20)
BUN: 14 mg/dL (ref 7.0–26.0)
CO2: 25 meq/L (ref 22–29)
Calcium: 10 mg/dL (ref 8.4–10.4)
Chloride: 107 mEq/L (ref 98–109)
Creatinine: 0.8 mg/dL (ref 0.6–1.1)
Glucose: 90 mg/dl (ref 70–140)
Potassium: 4 mEq/L (ref 3.5–5.1)
Sodium: 141 mEq/L (ref 136–145)
Total Protein: 7.7 g/dL (ref 6.4–8.3)

## 2013-07-05 MED ORDER — SODIUM CHLORIDE 0.9 % IV SOLN
5.0000 mg/kg | Freq: Once | INTRAVENOUS | Status: AC
  Administered 2013-07-05: 525 mg via INTRAVENOUS
  Filled 2013-07-05: qty 21

## 2013-07-05 MED ORDER — DEXAMETHASONE SODIUM PHOSPHATE 20 MG/5ML IJ SOLN
12.0000 mg | Freq: Once | INTRAMUSCULAR | Status: AC
  Administered 2013-07-05: 12 mg via INTRAVENOUS

## 2013-07-05 MED ORDER — ATROPINE SULFATE 1 MG/ML IJ SOLN
INTRAMUSCULAR | Status: AC
  Filled 2013-07-05: qty 1

## 2013-07-05 MED ORDER — SODIUM CHLORIDE 0.9 % IV SOLN
Freq: Once | INTRAVENOUS | Status: AC
  Administered 2013-07-05: 13:00:00 via INTRAVENOUS

## 2013-07-05 MED ORDER — LEUCOVORIN CALCIUM INJECTION 350 MG
950.0000 mg | Freq: Once | INTRAMUSCULAR | Status: AC
  Administered 2013-07-05: 950 mg via INTRAVENOUS
  Filled 2013-07-05: qty 47.5

## 2013-07-05 MED ORDER — SODIUM CHLORIDE 0.9 % IV SOLN
150.0000 mg | Freq: Once | INTRAVENOUS | Status: AC
  Administered 2013-07-05: 150 mg via INTRAVENOUS
  Filled 2013-07-05: qty 5

## 2013-07-05 MED ORDER — IRINOTECAN HCL CHEMO INJECTION 100 MG/5ML
180.0000 mg/m2 | Freq: Once | INTRAVENOUS | Status: AC
  Administered 2013-07-05: 420 mg via INTRAVENOUS
  Filled 2013-07-05: qty 21

## 2013-07-05 MED ORDER — FLUOROURACIL CHEMO INJECTION 2.5 GM/50ML
400.0000 mg/m2 | Freq: Once | INTRAVENOUS | Status: AC
  Administered 2013-07-05: 950 mg via INTRAVENOUS
  Filled 2013-07-05: qty 19

## 2013-07-05 MED ORDER — PALONOSETRON HCL INJECTION 0.25 MG/5ML
0.2500 mg | Freq: Once | INTRAVENOUS | Status: AC
  Administered 2013-07-05: 0.25 mg via INTRAVENOUS

## 2013-07-05 MED ORDER — DEXAMETHASONE SODIUM PHOSPHATE 20 MG/5ML IJ SOLN
INTRAMUSCULAR | Status: AC
  Filled 2013-07-05: qty 5

## 2013-07-05 MED ORDER — ATROPINE SULFATE 1 MG/ML IJ SOLN
0.5000 mg | Freq: Once | INTRAMUSCULAR | Status: AC | PRN
  Administered 2013-07-05: 0.5 mg via INTRAVENOUS

## 2013-07-05 MED ORDER — PALONOSETRON HCL INJECTION 0.25 MG/5ML
INTRAVENOUS | Status: AC
  Filled 2013-07-05: qty 5

## 2013-07-05 MED ORDER — SODIUM CHLORIDE 0.9 % IV SOLN
2400.0000 mg/m2 | INTRAVENOUS | Status: DC
  Administered 2013-07-05: 5550 mg via INTRAVENOUS
  Filled 2013-07-05: qty 111

## 2013-07-05 NOTE — Progress Notes (Signed)
OFFICE PROGRESS NOTE  Interval history:  Ms. Pulcini returns for followup of metastatic colon cancer. She completed cycle 11 FOLFIRI/Avastin 06/21/2013. She denies nausea/vomiting. No mouth sores. No diarrhea. She denies any bleeding. No shortness of breath or chest pain. No leg swelling or calf pain. She denies abdominal pain. She continues to have a good appetite.   Objective: Filed Vitals:   07/05/13 1126  BP: 161/88  Pulse: 72  Temp: 98.6 F (37 C)  Resp: 20   Oropharynx is without thrush or ulcerations. Lungs are clear. Regular cardiac rhythm. Port-A-Cath site without erythema. Abdomen soft and nontender. No hepatomegaly. No leg edema.   Lab Results: Lab Results  Component Value Date   WBC 5.2 07/05/2013   HGB 11.2* 07/05/2013   HCT 34.1* 07/05/2013   MCV 90.9 07/05/2013   PLT 161 07/05/2013   NEUTROABS 2.8 07/05/2013    Chemistry:    Chemistry      Component Value Date/Time   NA 140 06/19/2013 1328   NA 137 02/15/2013 1200   K 3.8 06/19/2013 1328   K 3.4* 02/15/2013 1200   CL 103 02/15/2013 1200   CL 105 10/19/2012 0928   CO2 23 06/19/2013 1328   CO2 24 02/15/2013 1200   BUN 14.2 06/19/2013 1328   BUN 10 02/15/2013 1200   CREATININE 0.9 06/19/2013 1328   CREATININE 0.82 02/15/2013 1200   CREATININE 0.89 03/02/2012 1524      Component Value Date/Time   CALCIUM 10.2 06/19/2013 1328   CALCIUM 9.9 02/15/2013 1200   ALKPHOS 71 06/19/2013 1328   ALKPHOS 73 02/15/2013 1200   AST 24 06/19/2013 1328   AST 18 02/15/2013 1200   ALT 20 06/19/2013 1328   ALT 15 02/15/2013 1200   BILITOT 0.57 06/19/2013 1328   BILITOT 0.3 02/15/2013 1200       Studies/Results: Ct Chest W Contrast  06/19/2013   CLINICAL DATA:  Follow-up lung nodules, metastatic colon cancer  EXAM: CT CHEST WITH CONTRAST  TECHNIQUE: Multidetector CT imaging of the chest was performed during intravenous contrast administration.  CONTRAST:  59m OMNIPAQUE IOHEXOL 300 MG/ML  SOLN  COMPARISON:  03/29/2013  FINDINGS: Mild  progression of bilateral pulmonary nodules/metastases, including:  --5 mm right upper lobe nodule (series 5/ image 16), previously 4 mm  --1.5 x 1.4 cm left upper lobe nodule (series 5/image 17), previously 1.2 x 1.3 cm  --1.3 x 1.3 cm left upper lobe nodule (series 5/image 22), previously 1.2 x 1.2 cm  --6 mm left lower lobe nodule (series 5/image 25), previously 4 mm  --1.5 x 1.3 cm right middle lobe nodule (series 5/image 34), previously 1.0 x 1.3 cm  No pleural effusion or pneumothorax.  Visualized thyroid is unremarkable.  The heart is normal in size. No pericardial effusion. Mild atherosclerotic calcifications of the aortic arch.  Right chest port.  No suspicious mediastinal, hilar, or axillary lymphadenopathy.  Visualized upper abdomen is notable for a partial right hepatectomy and hepatic steatosis.  Degenerative changes of the visualized thoracolumbar spine.  IMPRESSION: Mild progression of bilateral pulmonary nodules/metastases, as described above.   Electronically Signed   By: SJulian HyM.D.   On: 06/19/2013 16:04    Medications: I have reviewed the patient's current medications.  Assessment/Plan: 1. Metastatic colon cancer (T4, N0, M1) adenocarcinoma of the sigmoid colon status post sigmoid colectomy and partial right hepatectomy 04/11/2012. K-ras wild-type on the initial codon 12 and 13 testing. K-ras Q61L mutation identified on extended testing. She completed cycle  1 CAPOX beginning 05/25/2012.  She completed cycle 8 CAPOX beginning 10/19/2012.  CEA on 10/19/2012 normal at 0.8.  CEA 6.1 on 01/11/2013  Restaging CT on 01/11/2013 consistent with multiple new pulmonary metastases  Cycle 1 of FOLFIRI/Avastin 01/18/2013; cycle 2 02/01/2013,cycle 3 02/16/2013.  CEA 5.0 on 03/01/2013.  Cycle 4 FOLFIRI/Avastin 03/01/2013.  Cycle 5 FOLFIRI/Avastin 03/15/2013.  Restaging chest CT 03/29/2013 with possible slight decrease in size of a left upper lobe metastasis. Other pulmonary nodules  stable.  Cycle 6 FOLFIRI/Avastin 03/29/2013  Cycle 7 of FOLFIRI/Avastin 04/12/2013.  CEA 6.3 05/03/2013.  Cycle 8 FOLFIRI/Avastin 05/03/2013.  Cycle 9 FOLFIRI/Avastin 05/17/2013.  Cycle 10 FOLFIRI/Avastin 05/31/2013.  CEA 10.6 on 06/19/2013.  CT chest 06/19/2013 with mild progression of bilateral pulmonary nodules/metastases. Decision made to continue FOLFIRI/Avastin. She completed cycle 11 06/21/2013. 2. Microcytic anemia, iron deficiency. Improved.  3. Markedly elevated preoperative CEA. The CEA was mildly elevated on 05/16/2012. The CEA was in normal range at 0.5 on 08/17/2012 and 0.8 on 10/19/2012. Elevated at 6.1 on 01/11/2013.  4. Status post Port-A-Cath placement 03/14/2012. X-ray showed the Port-A-Cath tip to be in the azygos vein. The left-sided Port-A-Cath was removed and a new right Port-A-Cath was placed on 05/23/2012. 5. 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 6. Hypertension-persistent on amlodipine. Lisinopril 5 mg daily added on 03/01/2013. Lisinopril increased to 10 mg daily on 03/15/2013. Persistent. 7. Delayed nausea following cycle 5 FOLFIRI/Avastin. Emend was added to the premedication regimen.  Dispositon-she appears stable. She has completed 11 cycles of FOLFIRI/Avastin. We will followup on the CEA from today. Plan to proceed with cycle 12 today as scheduled. She will return for a followup visit and treatment in 2 weeks. Repeat chest CT is planned at a 6-8 week interval.   Ned Card ANP/GNP-BC

## 2013-07-05 NOTE — Patient Instructions (Signed)
[place] Discharge Instructions for Patients Receiving Chemotherapy  Today you received the following chemotherapy agents: Avastin, Irinotecan, Leucovorin, 5FU  To help prevent nausea and vomiting after your treatment, we encourage you to take your nausea medication as prescribed.   If you develop nausea and vomiting that is not controlled by your nausea medication, call the clinic.   BELOW ARE SYMPTOMS THAT SHOULD BE REPORTED IMMEDIATELY:  *FEVER GREATER THAN 100.5 F  *CHILLS WITH OR WITHOUT FEVER  NAUSEA AND VOMITING THAT IS NOT CONTROLLED WITH YOUR NAUSEA MEDICATION  *UNUSUAL SHORTNESS OF BREATH  *UNUSUAL BRUISING OR BLEEDING  TENDERNESS IN MOUTH AND THROAT WITH OR WITHOUT PRESENCE OF ULCERS  *URINARY PROBLEMS  *BOWEL PROBLEMS  UNUSUAL RASH Items with * indicate a potential emergency and should be followed up as soon as possible.  Feel free to call the clinic you have any questions or concerns. The clinic phone number is [phone].

## 2013-07-05 NOTE — Telephone Encounter (Signed)
gv and printed aptp sched and avs for pt for March and April...sed added tx.

## 2013-07-06 LAB — CEA: CEA: 10.9 ng/mL — ABNORMAL HIGH (ref 0.0–5.0)

## 2013-07-07 ENCOUNTER — Ambulatory Visit (HOSPITAL_BASED_OUTPATIENT_CLINIC_OR_DEPARTMENT_OTHER): Payer: Medicaid Other

## 2013-07-07 ENCOUNTER — Encounter: Payer: Self-pay | Admitting: *Deleted

## 2013-07-07 VITALS — BP 139/79 | HR 58 | Temp 97.7°F

## 2013-07-07 DIAGNOSIS — C187 Malignant neoplasm of sigmoid colon: Secondary | ICD-10-CM

## 2013-07-07 DIAGNOSIS — C787 Secondary malignant neoplasm of liver and intrahepatic bile duct: Principal | ICD-10-CM

## 2013-07-07 DIAGNOSIS — C189 Malignant neoplasm of colon, unspecified: Secondary | ICD-10-CM

## 2013-07-07 DIAGNOSIS — Z452 Encounter for adjustment and management of vascular access device: Secondary | ICD-10-CM

## 2013-07-07 MED ORDER — SODIUM CHLORIDE 0.9 % IJ SOLN
10.0000 mL | INTRAMUSCULAR | Status: DC | PRN
  Administered 2013-07-07: 10 mL
  Filled 2013-07-07: qty 10

## 2013-07-07 MED ORDER — HEPARIN SOD (PORK) LOCK FLUSH 100 UNIT/ML IV SOLN
500.0000 [IU] | Freq: Once | INTRAVENOUS | Status: AC | PRN
  Administered 2013-07-07: 500 [IU]
  Filled 2013-07-07: qty 5

## 2013-07-07 NOTE — Progress Notes (Signed)
[place] Work  Clinical Social Work met with Pt after her appt to check in and follow up on coping. She was very appreciative and reports to be coping well currently. Pt denies concerns currently and agrees to call if needed. She was appreciative of CSW follow up.   Clinical Social Work interventions: Emotional support.   [name], LCSW Clinical Social Worker [facility] Wednesday, Thursday and Friday Phone: [phone] Fax: [phone]

## 2013-07-16 ENCOUNTER — Other Ambulatory Visit: Payer: Self-pay | Admitting: Oncology

## 2013-07-17 ENCOUNTER — Other Ambulatory Visit: Payer: Self-pay | Admitting: Oncology

## 2013-07-17 ENCOUNTER — Other Ambulatory Visit: Payer: Self-pay | Admitting: Nurse Practitioner

## 2013-07-17 DIAGNOSIS — C189 Malignant neoplasm of colon, unspecified: Secondary | ICD-10-CM

## 2013-07-19 ENCOUNTER — Ambulatory Visit: Payer: Medicaid Other

## 2013-07-19 ENCOUNTER — Ambulatory Visit (HOSPITAL_BASED_OUTPATIENT_CLINIC_OR_DEPARTMENT_OTHER): Payer: Medicaid Other | Admitting: Oncology

## 2013-07-19 ENCOUNTER — Ambulatory Visit (HOSPITAL_BASED_OUTPATIENT_CLINIC_OR_DEPARTMENT_OTHER): Payer: Medicaid Other

## 2013-07-19 ENCOUNTER — Telehealth: Payer: Self-pay | Admitting: Oncology

## 2013-07-19 ENCOUNTER — Other Ambulatory Visit (HOSPITAL_BASED_OUTPATIENT_CLINIC_OR_DEPARTMENT_OTHER): Payer: Medicaid Other

## 2013-07-19 VITALS — BP 160/98 | HR 71 | Temp 98.0°F | Resp 19 | Ht 71.0 in | Wt 249.0 lb

## 2013-07-19 VITALS — BP 153/87 | HR 70

## 2013-07-19 DIAGNOSIS — C189 Malignant neoplasm of colon, unspecified: Secondary | ICD-10-CM

## 2013-07-19 DIAGNOSIS — Z5111 Encounter for antineoplastic chemotherapy: Secondary | ICD-10-CM

## 2013-07-19 DIAGNOSIS — I82A19 Acute embolism and thrombosis of unspecified axillary vein: Secondary | ICD-10-CM

## 2013-07-19 DIAGNOSIS — C187 Malignant neoplasm of sigmoid colon: Secondary | ICD-10-CM

## 2013-07-19 DIAGNOSIS — C787 Secondary malignant neoplasm of liver and intrahepatic bile duct: Secondary | ICD-10-CM

## 2013-07-19 DIAGNOSIS — L299 Pruritus, unspecified: Secondary | ICD-10-CM

## 2013-07-19 DIAGNOSIS — I82B19 Acute embolism and thrombosis of unspecified subclavian vein: Secondary | ICD-10-CM

## 2013-07-19 DIAGNOSIS — Z79899 Other long term (current) drug therapy: Secondary | ICD-10-CM

## 2013-07-19 DIAGNOSIS — C7800 Secondary malignant neoplasm of unspecified lung: Secondary | ICD-10-CM

## 2013-07-19 DIAGNOSIS — D509 Iron deficiency anemia, unspecified: Secondary | ICD-10-CM

## 2013-07-19 DIAGNOSIS — I10 Essential (primary) hypertension: Secondary | ICD-10-CM

## 2013-07-19 DIAGNOSIS — Z5112 Encounter for antineoplastic immunotherapy: Secondary | ICD-10-CM

## 2013-07-19 LAB — CBC WITH DIFFERENTIAL/PLATELET
BASO%: 0.5 % (ref 0.0–2.0)
BASOS ABS: 0 10*3/uL (ref 0.0–0.1)
EOS%: 3.4 % (ref 0.0–7.0)
Eosinophils Absolute: 0.2 10*3/uL (ref 0.0–0.5)
HCT: 34.8 % (ref 34.8–46.6)
HEMOGLOBIN: 11.5 g/dL — AB (ref 11.6–15.9)
LYMPH%: 39.4 % (ref 14.0–49.7)
MCH: 30.1 pg (ref 25.1–34.0)
MCHC: 33 g/dL (ref 31.5–36.0)
MCV: 91.1 fL (ref 79.5–101.0)
MONO#: 0.4 10*3/uL (ref 0.1–0.9)
MONO%: 6.6 % (ref 0.0–14.0)
NEUT#: 2.8 10*3/uL (ref 1.5–6.5)
NEUT%: 50.1 % (ref 38.4–76.8)
Platelets: 184 10*3/uL (ref 145–400)
RBC: 3.82 10*6/uL (ref 3.70–5.45)
RDW: 15.2 % — ABNORMAL HIGH (ref 11.2–14.5)
WBC: 5.6 10*3/uL (ref 3.9–10.3)
lymph#: 2.2 10*3/uL (ref 0.9–3.3)

## 2013-07-19 LAB — UA PROTEIN, DIPSTICK - CHCC: Protein, ur: NEGATIVE mg/dL

## 2013-07-19 LAB — COMPREHENSIVE METABOLIC PANEL (CC13)
ALT: 26 U/L (ref 0–55)
ANION GAP: 10 meq/L (ref 3–11)
AST: 23 U/L (ref 5–34)
Albumin: 4 g/dL (ref 3.5–5.0)
Alkaline Phosphatase: 71 U/L (ref 40–150)
BUN: 14.5 mg/dL (ref 7.0–26.0)
CALCIUM: 10.1 mg/dL (ref 8.4–10.4)
CHLORIDE: 107 meq/L (ref 98–109)
CO2: 22 meq/L (ref 22–29)
CREATININE: 0.8 mg/dL (ref 0.6–1.1)
Glucose: 98 mg/dl (ref 70–140)
Potassium: 3.8 mEq/L (ref 3.5–5.1)
SODIUM: 140 meq/L (ref 136–145)
TOTAL PROTEIN: 7.6 g/dL (ref 6.4–8.3)
Total Bilirubin: 0.32 mg/dL (ref 0.20–1.20)

## 2013-07-19 MED ORDER — SODIUM CHLORIDE 0.9 % IV SOLN
5.0000 mg/kg | Freq: Once | INTRAVENOUS | Status: AC
  Administered 2013-07-19: 525 mg via INTRAVENOUS
  Filled 2013-07-19: qty 21

## 2013-07-19 MED ORDER — PALONOSETRON HCL INJECTION 0.25 MG/5ML
INTRAVENOUS | Status: AC
Start: 2013-07-19 — End: 2013-07-19
  Filled 2013-07-19: qty 5

## 2013-07-19 MED ORDER — SODIUM CHLORIDE 0.9 % IV SOLN
2400.0000 mg/m2 | INTRAVENOUS | Status: DC
  Administered 2013-07-19: 5550 mg via INTRAVENOUS
  Filled 2013-07-19: qty 111

## 2013-07-19 MED ORDER — PALONOSETRON HCL INJECTION 0.25 MG/5ML
0.2500 mg | Freq: Once | INTRAVENOUS | Status: AC
  Administered 2013-07-19: 0.25 mg via INTRAVENOUS

## 2013-07-19 MED ORDER — DEXAMETHASONE SODIUM PHOSPHATE 20 MG/5ML IJ SOLN
12.0000 mg | Freq: Once | INTRAMUSCULAR | Status: AC
  Administered 2013-07-19: 12 mg via INTRAVENOUS

## 2013-07-19 MED ORDER — IRINOTECAN HCL CHEMO INJECTION 100 MG/5ML
180.0000 mg/m2 | Freq: Once | INTRAVENOUS | Status: AC
  Administered 2013-07-19: 420 mg via INTRAVENOUS
  Filled 2013-07-19: qty 21

## 2013-07-19 MED ORDER — ATROPINE SULFATE 1 MG/ML IJ SOLN
0.5000 mg | Freq: Once | INTRAMUSCULAR | Status: AC | PRN
  Administered 2013-07-19: 0.5 mg via INTRAVENOUS

## 2013-07-19 MED ORDER — SODIUM CHLORIDE 0.9 % IV SOLN: Freq: Once | INTRAVENOUS | Status: DC

## 2013-07-19 MED ORDER — ATROPINE SULFATE 1 MG/ML IJ SOLN
INTRAMUSCULAR | Status: AC
  Filled 2013-07-19: qty 1

## 2013-07-19 MED ORDER — SODIUM CHLORIDE 0.9 % IV SOLN
150.0000 mg | Freq: Once | INTRAVENOUS | Status: AC
  Administered 2013-07-19: 150 mg via INTRAVENOUS
  Filled 2013-07-19: qty 5

## 2013-07-19 MED ORDER — SODIUM CHLORIDE 0.9 % IV SOLN
Freq: Once | INTRAVENOUS | Status: AC
  Administered 2013-07-19: 11:00:00 via INTRAVENOUS

## 2013-07-19 MED ORDER — LEUCOVORIN CALCIUM INJECTION 350 MG
950.0000 mg | Freq: Once | INTRAVENOUS | Status: AC
  Administered 2013-07-19: 950 mg via INTRAVENOUS
  Filled 2013-07-19: qty 47.5

## 2013-07-19 MED ORDER — LISINOPRIL 20 MG PO TABS: 20.0000 mg | ORAL_TABLET | Freq: Every day | ORAL | Status: DC

## 2013-07-19 MED ORDER — FLUOROURACIL CHEMO INJECTION 2.5 GM/50ML
400.0000 mg/m2 | Freq: Once | INTRAVENOUS | Status: AC
  Administered 2013-07-19: 950 mg via INTRAVENOUS
  Filled 2013-07-19: qty 19

## 2013-07-19 MED ORDER — DEXAMETHASONE SODIUM PHOSPHATE 20 MG/5ML IJ SOLN
INTRAMUSCULAR | Status: AC
Start: 2013-07-19 — End: 2013-07-19
  Filled 2013-07-19: qty 5

## 2013-07-19 NOTE — Patient Instructions (Signed)
[place] Discharge Instructions for Patients Receiving Chemotherapy  Today you received the following chemotherapy agents avastin, camptosar, 76fu, leucovorin.  To help prevent nausea and vomiting after your treatment, we encourage you to take your nausea medication compazine.   If you develop nausea and vomiting that is not controlled by your nausea medication, call the clinic.   BELOW ARE SYMPTOMS THAT SHOULD BE REPORTED IMMEDIATELY:  *FEVER GREATER THAN 100.5 F  *CHILLS WITH OR WITHOUT FEVER  NAUSEA AND VOMITING THAT IS NOT CONTROLLED WITH YOUR NAUSEA MEDICATION  *UNUSUAL SHORTNESS OF BREATH  *UNUSUAL BRUISING OR BLEEDING  TENDERNESS IN MOUTH AND THROAT WITH OR WITHOUT PRESENCE OF ULCERS  *URINARY PROBLEMS  *BOWEL PROBLEMS  UNUSUAL RASH Items with * indicate a potential emergency and should be followed up as soon as possible.  Feel free to call the clinic you have any questions or concerns. The clinic phone number is [phone].

## 2013-07-19 NOTE — Progress Notes (Signed)
   [facility]    OFFICE PROGRESS NOTE   INTERVAL HISTORY:   [name] returns for scheduled followup of metastatic colon cancer. She completed another cycle of FOLFIRI/Avastin on 07/05/2013. Mild nausea and diarrhea on the day following chemotherapy. She reports pruritus and tearing of eyes. She associates this with seasonal allergies. This has occurred in the past.  Objective:  Vital signs in last 24 hours:  Blood pressure 160/98, pulse 71, temperature 98 F (36.7 C), temperature source Oral, resp. rate 19, height $RemoveBe'5\' 11"'gxjTpWhLV$  (1.803 m), weight 249 lb (112.946 kg), SpO2 100.00%.    HEENT: No thrush or ulcers. Hyperpigmentation of the oral mucosa. Resp: Lungs clear bilaterally Cardio: Regular rate and rhythm GI: No hepatomegaly, nontender Vascular: No leg edema  Skin: Hyperpigmentation of the hands   Portacath/PICC-without erythema  Lab Results:  Lab Results  Component Value Date   WBC 5.6 07/19/2013   HGB 11.5* 07/19/2013   HCT 34.8 07/19/2013   MCV 91.1 07/19/2013   PLT 184 07/19/2013   NEUTROABS 2.8 07/19/2013   CEA on 07/05/2013-10.9   Medications: I have reviewed the patient's current medications.  Assessment/Plan: 1. Metastatic colon cancer (T4, N0, M1) adenocarcinoma of the sigmoid colon status post sigmoid colectomy and partial right hepatectomy 04/11/2012. K-ras wild-type on the initial codon 12 and 13 testing. K-ras Q61L mutation identified on extended testing. She completed cycle 1 CAPOX beginning 05/25/2012.  She completed cycle 8 CAPOX beginning 10/19/2012.  CEA on 10/19/2012 normal at 0.8.  CEA 6.1 on 01/11/2013  Restaging CT on 01/11/2013 consistent with multiple new pulmonary metastases  Cycle 1 of FOLFIRI/Avastin 01/18/2013; cycle 2 02/01/2013,cycle 3 02/16/2013.  CEA 5.0 on 03/01/2013.  Cycle 4 FOLFIRI/Avastin 03/01/2013.  Cycle 5 FOLFIRI/Avastin 03/15/2013.  Restaging chest CT 03/29/2013 with possible slight decrease in size of a left upper  lobe metastasis. Other pulmonary nodules stable.  Cycle 6 FOLFIRI/Avastin 03/29/2013  Cycle 7 of FOLFIRI/Avastin 04/12/2013.  CEA 6.3 05/03/2013.  Cycle 8 FOLFIRI/Avastin 05/03/2013.  Cycle 9 FOLFIRI/Avastin 05/17/2013.  Cycle 10 FOLFIRI/Avastin 05/31/2013.  CEA 10.6 on 06/19/2013.  CT chest 06/19/2013 with mild progression of bilateral pulmonary nodules/metastases.  Decision made to continue FOLFIRI/Avastin. She completed cycle 11 06/21/2013. Cycle 12 FOLFIRI/Avastin 07/05/2013 2. Microcytic anemia, iron deficiency. Improved.  3. Markedly elevated preoperative CEA. The CEA was mildly elevated on 05/16/2012. The CEA was in normal range at 0.5 on 08/17/2012 and 0.8 on 10/19/2012. Elevated at 6.1 on 01/11/2013.  4. Status post Port-A-Cath placement 03/14/2012. X-ray showed the Port-A-Cath tip to be in the azygos vein. The left-sided Port-A-Cath was removed and a new right Port-A-Cath was placed on 05/23/2012. 5. 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 6. Hypertension-persistent on amlodipine. Lisinopril 5 mg daily added on 03/01/2013. Lisinopril increased to 10 mg daily on 03/15/2013. We will increase the lisinopril to 20 mg daily 7. Delayed nausea following cycle 5 FOLFIRI/Avastin. Emend was added to the premedication regimen. 8. Eye tearing and pruritus-most likely secondary to seasonal allergies, though this could be a toxicity from 5-FU   Disposition:  Ms. Blass appears well. The plan is to continue FOLFIRI/Avastin. We will plan for a restaging CT after 2-3 additional cycles of chemotherapy. We increased the lisinopril today.   Betsy Coder, MD  07/19/2013  9:19 AM

## 2013-07-19 NOTE — Telephone Encounter (Signed)
gv adn printed appt sched and avs for pt for March adn April....sed added tx. °

## 2013-07-20 LAB — CEA: CEA: 12.3 ng/mL — ABNORMAL HIGH (ref 0.0–5.0)

## 2013-07-21 ENCOUNTER — Ambulatory Visit (HOSPITAL_BASED_OUTPATIENT_CLINIC_OR_DEPARTMENT_OTHER): Payer: Medicaid Other

## 2013-07-21 VITALS — BP 125/83 | HR 75 | Temp 97.6°F

## 2013-07-21 DIAGNOSIS — C187 Malignant neoplasm of sigmoid colon: Secondary | ICD-10-CM

## 2013-07-21 DIAGNOSIS — C189 Malignant neoplasm of colon, unspecified: Secondary | ICD-10-CM

## 2013-07-21 DIAGNOSIS — C787 Secondary malignant neoplasm of liver and intrahepatic bile duct: Secondary | ICD-10-CM

## 2013-07-21 MED ORDER — SODIUM CHLORIDE 0.9 % IJ SOLN
10.0000 mL | INTRAMUSCULAR | Status: DC | PRN
  Administered 2013-07-21: 10 mL
  Filled 2013-07-21: qty 10

## 2013-07-21 MED ORDER — HEPARIN SOD (PORK) LOCK FLUSH 100 UNIT/ML IV SOLN
500.0000 [IU] | Freq: Once | INTRAVENOUS | Status: AC | PRN
  Administered 2013-07-21: 500 [IU]
  Filled 2013-07-21: qty 5

## 2013-07-30 ENCOUNTER — Other Ambulatory Visit: Payer: Self-pay | Admitting: Oncology

## 2013-08-01 ENCOUNTER — Other Ambulatory Visit (HOSPITAL_BASED_OUTPATIENT_CLINIC_OR_DEPARTMENT_OTHER): Payer: Medicaid Other

## 2013-08-01 ENCOUNTER — Other Ambulatory Visit: Payer: Self-pay

## 2013-08-01 ENCOUNTER — Ambulatory Visit (HOSPITAL_BASED_OUTPATIENT_CLINIC_OR_DEPARTMENT_OTHER): Payer: Medicaid Other | Admitting: Oncology

## 2013-08-01 ENCOUNTER — Ambulatory Visit (HOSPITAL_BASED_OUTPATIENT_CLINIC_OR_DEPARTMENT_OTHER): Payer: Medicaid Other

## 2013-08-01 VITALS — BP 148/92 | HR 80 | Temp 98.3°F | Resp 18 | Ht 71.0 in | Wt 247.1 lb

## 2013-08-01 DIAGNOSIS — Z5112 Encounter for antineoplastic immunotherapy: Secondary | ICD-10-CM

## 2013-08-01 DIAGNOSIS — C787 Secondary malignant neoplasm of liver and intrahepatic bile duct: Secondary | ICD-10-CM

## 2013-08-01 DIAGNOSIS — C189 Malignant neoplasm of colon, unspecified: Secondary | ICD-10-CM

## 2013-08-01 DIAGNOSIS — C187 Malignant neoplasm of sigmoid colon: Secondary | ICD-10-CM

## 2013-08-01 DIAGNOSIS — Z5111 Encounter for antineoplastic chemotherapy: Secondary | ICD-10-CM

## 2013-08-01 DIAGNOSIS — D509 Iron deficiency anemia, unspecified: Secondary | ICD-10-CM

## 2013-08-01 DIAGNOSIS — I82B19 Acute embolism and thrombosis of unspecified subclavian vein: Secondary | ICD-10-CM

## 2013-08-01 DIAGNOSIS — R970 Elevated carcinoembryonic antigen [CEA]: Secondary | ICD-10-CM

## 2013-08-01 DIAGNOSIS — I82A19 Acute embolism and thrombosis of unspecified axillary vein: Secondary | ICD-10-CM

## 2013-08-01 LAB — CBC WITH DIFFERENTIAL/PLATELET
BASO%: 1 % (ref 0.0–2.0)
Basophils Absolute: 0 10*3/uL (ref 0.0–0.1)
EOS ABS: 0.2 10*3/uL (ref 0.0–0.5)
EOS%: 3.6 % (ref 0.0–7.0)
HCT: 35.2 % (ref 34.8–46.6)
HGB: 11.5 g/dL — ABNORMAL LOW (ref 11.6–15.9)
LYMPH%: 38.4 % (ref 14.0–49.7)
MCH: 30.2 pg (ref 25.1–34.0)
MCHC: 32.8 g/dL (ref 31.5–36.0)
MCV: 92 fL (ref 79.5–101.0)
MONO#: 0.4 10*3/uL (ref 0.1–0.9)
MONO%: 8.2 % (ref 0.0–14.0)
NEUT%: 48.8 % (ref 38.4–76.8)
NEUTROS ABS: 2.4 10*3/uL (ref 1.5–6.5)
PLATELETS: 214 10*3/uL (ref 145–400)
RBC: 3.82 10*6/uL (ref 3.70–5.45)
RDW: 16.5 % — AB (ref 11.2–14.5)
WBC: 4.9 10*3/uL (ref 3.9–10.3)
lymph#: 1.9 10*3/uL (ref 0.9–3.3)

## 2013-08-01 LAB — COMPREHENSIVE METABOLIC PANEL (CC13)
ALK PHOS: 76 U/L (ref 40–150)
ALT: 24 U/L (ref 0–55)
AST: 25 U/L (ref 5–34)
Albumin: 4.1 g/dL (ref 3.5–5.0)
Anion Gap: 9 mEq/L (ref 3–11)
BUN: 14.8 mg/dL (ref 7.0–26.0)
CO2: 26 mEq/L (ref 22–29)
Calcium: 10.2 mg/dL (ref 8.4–10.4)
Chloride: 108 mEq/L (ref 98–109)
Creatinine: 0.9 mg/dL (ref 0.6–1.1)
Glucose: 95 mg/dl (ref 70–140)
Potassium: 4.1 mEq/L (ref 3.5–5.1)
SODIUM: 142 meq/L (ref 136–145)
TOTAL PROTEIN: 7.8 g/dL (ref 6.4–8.3)
Total Bilirubin: 0.41 mg/dL (ref 0.20–1.20)

## 2013-08-01 MED ORDER — SODIUM CHLORIDE 0.9 % IV SOLN
150.0000 mg | Freq: Once | INTRAVENOUS | Status: AC
  Administered 2013-08-01: 150 mg via INTRAVENOUS
  Filled 2013-08-01: qty 5

## 2013-08-01 MED ORDER — ATROPINE SULFATE 1 MG/ML IJ SOLN
0.5000 mg | Freq: Once | INTRAMUSCULAR | Status: AC | PRN
  Administered 2013-08-01: 0.5 mg via INTRAVENOUS

## 2013-08-01 MED ORDER — BEVACIZUMAB CHEMO INJECTION 400 MG/16ML
5.0000 mg/kg | Freq: Once | INTRAVENOUS | Status: AC
  Administered 2013-08-01: 525 mg via INTRAVENOUS
  Filled 2013-08-01: qty 21

## 2013-08-01 MED ORDER — SODIUM CHLORIDE 0.9 % IV SOLN
2400.0000 mg/m2 | INTRAVENOUS | Status: DC
  Administered 2013-08-01: 5550 mg via INTRAVENOUS
  Filled 2013-08-01: qty 111

## 2013-08-01 MED ORDER — DEXAMETHASONE SODIUM PHOSPHATE 20 MG/5ML IJ SOLN
12.0000 mg | Freq: Once | INTRAMUSCULAR | Status: AC
  Administered 2013-08-01: 12 mg via INTRAVENOUS

## 2013-08-01 MED ORDER — ATROPINE SULFATE 1 MG/ML IJ SOLN
INTRAMUSCULAR | Status: AC
  Filled 2013-08-01: qty 1

## 2013-08-01 MED ORDER — PALONOSETRON HCL INJECTION 0.25 MG/5ML
0.2500 mg | Freq: Once | INTRAVENOUS | Status: AC
  Administered 2013-08-01: 0.25 mg via INTRAVENOUS

## 2013-08-01 MED ORDER — DEXAMETHASONE SODIUM PHOSPHATE 20 MG/5ML IJ SOLN
INTRAMUSCULAR | Status: AC
  Filled 2013-08-01: qty 5

## 2013-08-01 MED ORDER — LEUCOVORIN CALCIUM INJECTION 350 MG
950.0000 mg | Freq: Once | INTRAMUSCULAR | Status: AC
  Administered 2013-08-01: 950 mg via INTRAVENOUS
  Filled 2013-08-01: qty 47.5

## 2013-08-01 MED ORDER — PALONOSETRON HCL INJECTION 0.25 MG/5ML
INTRAVENOUS | Status: AC
  Filled 2013-08-01: qty 5

## 2013-08-01 MED ORDER — FLUOROURACIL CHEMO INJECTION 2.5 GM/50ML
400.0000 mg/m2 | Freq: Once | INTRAVENOUS | Status: AC
  Administered 2013-08-01: 950 mg via INTRAVENOUS
  Filled 2013-08-01: qty 19

## 2013-08-01 MED ORDER — DEXTROSE 5 % IV SOLN
180.0000 mg/m2 | Freq: Once | INTRAVENOUS | Status: AC
  Administered 2013-08-01: 420 mg via INTRAVENOUS
  Filled 2013-08-01: qty 21

## 2013-08-01 MED ORDER — SODIUM CHLORIDE 0.9 % IV SOLN
Freq: Once | INTRAVENOUS | Status: AC
  Administered 2013-08-01: 12:00:00 via INTRAVENOUS

## 2013-08-01 NOTE — Patient Instructions (Signed)
[facility] Discharge Instructions for Patients Receiving Chemotherapy  Today you received the following chemotherapy agents Camptosar/Adrucil/Leucovorin/Avastin.  To help prevent nausea and vomiting after your treatment, we encourage you to take your nausea medication if needed.   If you develop nausea and vomiting that is not controlled by your nausea medication, call the clinic.   BELOW ARE SYMPTOMS THAT SHOULD BE REPORTED IMMEDIATELY:  *FEVER GREATER THAN 100.5 F  *CHILLS WITH OR WITHOUT FEVER  NAUSEA AND VOMITING THAT IS NOT CONTROLLED WITH YOUR NAUSEA MEDICATION  *UNUSUAL SHORTNESS OF BREATH  *UNUSUAL BRUISING OR BLEEDING  TENDERNESS IN MOUTH AND THROAT WITH OR WITHOUT PRESENCE OF ULCERS  *URINARY PROBLEMS  *BOWEL PROBLEMS  UNUSUAL RASH Items with * indicate a potential emergency and should be followed up as soon as possible.  Feel free to call the clinic you have any questions or concerns. The clinic phone number is [phone].

## 2013-08-01 NOTE — Progress Notes (Signed)
  [facility] OFFICE PROGRESS NOTE   Diagnosis: Metastatic colon cancer  INTERVAL HISTORY:   [name] completed another cycle of FOLFIRI/Avastin 07/19/2013. She reports mild nausea and diarrhea for one day following chemotherapy. The diarrhea improved with Imodium. No other complaint. No bleeding.  Objective:  Vital signs in last 24 hours:  Blood pressure 148/92, pulse 80, temperature 98.3 F (36.8 C), temperature source Oral, resp. rate 18, height $RemoveBe'5\' 11"'OAeCDlXSk$  (1.803 m), weight 247 lb 1.6 oz (112.084 kg), SpO2 100.00%.    HEENT: No thrush or ulcers Resp: Lungs clear bilaterally Cardio: Regular rate and rhythm GI: No hepatomegaly, nontender Vascular: No leg edema  Skin: Hyperpigmentation of the hands   Portacath/PICC-without erythema  Lab Results:  Lab Results  Component Value Date   WBC 4.9 08/01/2013   HGB 11.5* 08/01/2013   HCT 35.2 08/01/2013   MCV 92.0 08/01/2013   PLT 214 08/01/2013   NEUTROABS 2.4 08/01/2013    Lab Results  Component Value Date   CEA 12.3* 07/19/2013    Medications: I have reviewed the patient's current medications.  Assessment/Plan: 1. Metastatic colon cancer (T4, N0, M1) adenocarcinoma of the sigmoid colon status post sigmoid colectomy and partial right hepatectomy 04/11/2012. K-ras wild-type on the initial codon 12 and 13 testing. K-ras Q61L mutation identified on extended testing. She completed cycle 1 CAPOX beginning 05/25/2012.  She completed cycle 8 CAPOX beginning 10/19/2012.  CEA on 10/19/2012 normal at 0.8.  CEA 6.1 on 01/11/2013  Restaging CT on 01/11/2013 consistent with multiple new pulmonary metastases  Cycle 1 of FOLFIRI/Avastin 01/18/2013; cycle 2 02/01/2013,cycle 3 02/16/2013.  CEA 5.0 on 03/01/2013.  Cycle 4 FOLFIRI/Avastin 03/01/2013.  Cycle 5 FOLFIRI/Avastin 03/15/2013.  Restaging chest CT 03/29/2013 with possible slight decrease in size of a left upper lobe metastasis. Other pulmonary nodules stable.  Cycle 6  FOLFIRI/Avastin 03/29/2013  Cycle 7 of FOLFIRI/Avastin 04/12/2013.  CEA 6.3 05/03/2013.  Cycle 8 FOLFIRI/Avastin 05/03/2013.  Cycle 9 FOLFIRI/Avastin 05/17/2013.  Cycle 10 FOLFIRI/Avastin 05/31/2013.  CEA 10.6 on 06/19/2013.  CT chest 06/19/2013 with mild progression of bilateral pulmonary nodules/metastases.  Decision made to continue FOLFIRI/Avastin. She completed cycle 11 06/21/2013.  Cycle 12 FOLFIRI/Avastin 07/05/2013 Cycle 13 FOLFIRI/Avastin 07/19/2013 2. Microcytic anemia, iron deficiency. Improved.  3. Markedly elevated preoperative CEA. The CEA was mildly elevated on 05/16/2012. The CEA was in normal range at 0.5 on 08/17/2012 and 0.8 on 10/19/2012. Elevated at 6.1 on 01/11/2013.  4. Status post Port-A-Cath placement 03/14/2012. X-ray showed the Port-A-Cath tip to be in the azygos vein. The left-sided Port-A-Cath was removed and a new right Port-A-Cath was placed on 05/23/2012. 5. 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 6. Hypertension-persistent on amlodipine. Lisinopril 5 mg daily added on 03/01/2013. Lisinopril increased to 10 mg daily on 03/15/2013. Increased to 20 mg 07/19/2013 7. Delayed nausea following cycle 5 FOLFIRI/Avastin. Emend was added to the premedication regimen.  Disposition:  [name] appears stable. The CEA was slightly higher 2 weeks ago. This will be repeated today. She will complete another treatment with FOLFIRI/Avastin today. [name] will return for an office visit in 2 weeks. We will obtain a repeat chest CT if the CEA continues to rise  Betsy Coder, MD  08/01/2013  5:36 PM

## 2013-08-02 LAB — CEA: CEA: 13.9 ng/mL — AB (ref 0.0–5.0)

## 2013-08-03 ENCOUNTER — Ambulatory Visit (HOSPITAL_BASED_OUTPATIENT_CLINIC_OR_DEPARTMENT_OTHER): Payer: Medicaid Other

## 2013-08-03 ENCOUNTER — Encounter: Payer: Self-pay | Admitting: Oncology

## 2013-08-03 VITALS — BP 139/85 | HR 65 | Temp 97.6°F

## 2013-08-03 DIAGNOSIS — C187 Malignant neoplasm of sigmoid colon: Secondary | ICD-10-CM

## 2013-08-03 DIAGNOSIS — C189 Malignant neoplasm of colon, unspecified: Secondary | ICD-10-CM

## 2013-08-03 DIAGNOSIS — C787 Secondary malignant neoplasm of liver and intrahepatic bile duct: Secondary | ICD-10-CM

## 2013-08-03 MED ORDER — HEPARIN SOD (PORK) LOCK FLUSH 100 UNIT/ML IV SOLN
500.0000 [IU] | Freq: Once | INTRAVENOUS | Status: AC | PRN
  Administered 2013-08-03: 500 [IU]
  Filled 2013-08-03: qty 5

## 2013-08-03 MED ORDER — SODIUM CHLORIDE 0.9 % IJ SOLN
10.0000 mL | INTRAMUSCULAR | Status: DC | PRN
  Administered 2013-08-03: 10 mL
  Filled 2013-08-03: qty 10

## 2013-08-03 NOTE — Progress Notes (Signed)
Patient bought in proof of income and she qualifies for 400.00 grant. She will be bringing a bill back to pay.

## 2013-08-09 ENCOUNTER — Other Ambulatory Visit: Payer: Self-pay | Admitting: Nurse Practitioner

## 2013-08-13 ENCOUNTER — Other Ambulatory Visit: Payer: Self-pay | Admitting: Oncology

## 2013-08-16 ENCOUNTER — Ambulatory Visit (HOSPITAL_BASED_OUTPATIENT_CLINIC_OR_DEPARTMENT_OTHER): Payer: Medicaid Other | Admitting: Nurse Practitioner

## 2013-08-16 ENCOUNTER — Ambulatory Visit (HOSPITAL_BASED_OUTPATIENT_CLINIC_OR_DEPARTMENT_OTHER): Payer: Medicaid Other

## 2013-08-16 ENCOUNTER — Telehealth: Payer: Self-pay | Admitting: Oncology

## 2013-08-16 ENCOUNTER — Other Ambulatory Visit (HOSPITAL_BASED_OUTPATIENT_CLINIC_OR_DEPARTMENT_OTHER): Payer: Medicaid Other

## 2013-08-16 VITALS — BP 185/100 | HR 73 | Temp 97.2°F | Resp 19 | Ht 71.0 in | Wt 251.0 lb

## 2013-08-16 DIAGNOSIS — C787 Secondary malignant neoplasm of liver and intrahepatic bile duct: Secondary | ICD-10-CM

## 2013-08-16 DIAGNOSIS — C7800 Secondary malignant neoplasm of unspecified lung: Secondary | ICD-10-CM

## 2013-08-16 DIAGNOSIS — C187 Malignant neoplasm of sigmoid colon: Secondary | ICD-10-CM

## 2013-08-16 DIAGNOSIS — D509 Iron deficiency anemia, unspecified: Secondary | ICD-10-CM

## 2013-08-16 DIAGNOSIS — C189 Malignant neoplasm of colon, unspecified: Secondary | ICD-10-CM

## 2013-08-16 DIAGNOSIS — I10 Essential (primary) hypertension: Secondary | ICD-10-CM

## 2013-08-16 DIAGNOSIS — Z86718 Personal history of other venous thrombosis and embolism: Secondary | ICD-10-CM

## 2013-08-16 DIAGNOSIS — Z7901 Long term (current) use of anticoagulants: Secondary | ICD-10-CM

## 2013-08-16 DIAGNOSIS — Z5111 Encounter for antineoplastic chemotherapy: Secondary | ICD-10-CM

## 2013-08-16 LAB — CEA: CEA: 18.3 ng/mL — ABNORMAL HIGH (ref 0.0–5.0)

## 2013-08-16 LAB — CBC WITH DIFFERENTIAL/PLATELET
BASO%: 1.1 % (ref 0.0–2.0)
Basophils Absolute: 0.1 10*3/uL (ref 0.0–0.1)
EOS%: 3.7 % (ref 0.0–7.0)
Eosinophils Absolute: 0.2 10*3/uL (ref 0.0–0.5)
HCT: 35.6 % (ref 34.8–46.6)
HGB: 11.7 g/dL (ref 11.6–15.9)
LYMPH%: 39 % (ref 14.0–49.7)
MCH: 30.4 pg (ref 25.1–34.0)
MCHC: 32.7 g/dL (ref 31.5–36.0)
MCV: 92.8 fL (ref 79.5–101.0)
MONO#: 0.3 10*3/uL (ref 0.1–0.9)
MONO%: 6.9 % (ref 0.0–14.0)
NEUT#: 2.4 10*3/uL (ref 1.5–6.5)
NEUT%: 49.3 % (ref 38.4–76.8)
Platelets: 187 10*3/uL (ref 145–400)
RBC: 3.84 10*6/uL (ref 3.70–5.45)
RDW: 16.6 % — ABNORMAL HIGH (ref 11.2–14.5)
WBC: 5 10*3/uL (ref 3.9–10.3)
lymph#: 1.9 10*3/uL (ref 0.9–3.3)

## 2013-08-16 LAB — COMPREHENSIVE METABOLIC PANEL (CC13)
ALT: 37 U/L (ref 0–55)
AST: 29 U/L (ref 5–34)
Albumin: 4 g/dL (ref 3.5–5.0)
Alkaline Phosphatase: 75 U/L (ref 40–150)
Anion Gap: 10 mEq/L (ref 3–11)
BUN: 13.5 mg/dL (ref 7.0–26.0)
CO2: 21 mEq/L — ABNORMAL LOW (ref 22–29)
Calcium: 10.2 mg/dL (ref 8.4–10.4)
Chloride: 108 mEq/L (ref 98–109)
Creatinine: 0.9 mg/dL (ref 0.6–1.1)
Glucose: 96 mg/dl (ref 70–140)
Potassium: 4.2 mEq/L (ref 3.5–5.1)
Sodium: 140 mEq/L (ref 136–145)
Total Bilirubin: 0.37 mg/dL (ref 0.20–1.20)
Total Protein: 7.8 g/dL (ref 6.4–8.3)

## 2013-08-16 LAB — UA PROTEIN, DIPSTICK - CHCC: Protein, ur: <30 mg/dL

## 2013-08-16 MED ORDER — FLUOROURACIL CHEMO INJECTION 2.5 GM/50ML
400.0000 mg/m2 | Freq: Once | INTRAVENOUS | Status: AC
  Administered 2013-08-16: 950 mg via INTRAVENOUS
  Filled 2013-08-16: qty 19

## 2013-08-16 MED ORDER — PALONOSETRON HCL INJECTION 0.25 MG/5ML
INTRAVENOUS | Status: AC
  Filled 2013-08-16: qty 5

## 2013-08-16 MED ORDER — PALONOSETRON HCL INJECTION 0.25 MG/5ML
0.2500 mg | Freq: Once | INTRAVENOUS | Status: AC
  Administered 2013-08-16: 0.25 mg via INTRAVENOUS

## 2013-08-16 MED ORDER — ATROPINE SULFATE 1 MG/ML IJ SOLN
INTRAMUSCULAR | Status: AC
  Filled 2013-08-16: qty 1

## 2013-08-16 MED ORDER — AMLODIPINE BESYLATE 10 MG PO TABS: 10.0000 mg | ORAL_TABLET | Freq: Every day | ORAL | Status: DC

## 2013-08-16 MED ORDER — DEXAMETHASONE SODIUM PHOSPHATE 20 MG/5ML IJ SOLN
INTRAMUSCULAR | Status: AC
  Filled 2013-08-16: qty 5

## 2013-08-16 MED ORDER — SODIUM CHLORIDE 0.9 % IV SOLN
150.0000 mg | Freq: Once | INTRAVENOUS | Status: AC
  Administered 2013-08-16: 150 mg via INTRAVENOUS
  Filled 2013-08-16: qty 5

## 2013-08-16 MED ORDER — LIDOCAINE-PRILOCAINE 2.5-2.5 % EX CREA: TOPICAL_CREAM | CUTANEOUS | Status: AC | PRN

## 2013-08-16 MED ORDER — ATROPINE SULFATE 1 MG/ML IJ SOLN
0.5000 mg | Freq: Once | INTRAMUSCULAR | Status: AC | PRN
  Administered 2013-08-16: 0.5 mg via INTRAVENOUS

## 2013-08-16 MED ORDER — SODIUM CHLORIDE 0.9 % IV SOLN
2400.0000 mg/m2 | INTRAVENOUS | Status: DC
  Administered 2013-08-16: 5550 mg via INTRAVENOUS
  Filled 2013-08-16: qty 111

## 2013-08-16 MED ORDER — LEUCOVORIN CALCIUM INJECTION 350 MG
950.0000 mg | Freq: Once | INTRAVENOUS | Status: AC
  Administered 2013-08-16: 950 mg via INTRAVENOUS
  Filled 2013-08-16: qty 47.5

## 2013-08-16 MED ORDER — SODIUM CHLORIDE 0.9 % IV SOLN
Freq: Once | INTRAVENOUS | Status: AC
  Administered 2013-08-16: 10:00:00 via INTRAVENOUS

## 2013-08-16 MED ORDER — IRINOTECAN HCL CHEMO INJECTION 100 MG/5ML
180.0000 mg/m2 | Freq: Once | INTRAVENOUS | Status: AC
  Administered 2013-08-16: 420 mg via INTRAVENOUS
  Filled 2013-08-16: qty 21

## 2013-08-16 MED ORDER — DEXAMETHASONE SODIUM PHOSPHATE 20 MG/5ML IJ SOLN
12.0000 mg | Freq: Once | INTRAMUSCULAR | Status: AC
  Administered 2013-08-16: 12 mg via INTRAVENOUS

## 2013-08-16 NOTE — Telephone Encounter (Signed)
gv adnprinted appt sched and avs for pt for April adn May....Marland Kitchensed added tx.

## 2013-08-16 NOTE — Progress Notes (Signed)
[facility] OFFICE PROGRESS NOTE   Diagnosis:  Metastatic colon cancer.  INTERVAL HISTORY:   She returns as scheduled. She completed another cycle of FOLFIRI/Avastin on 08/01/2013. She denies nausea/vomiting. No mouth sores. No diarrhea. She denies bleeding. No unusual bruising. She denies shortness of breath. No chest pain. She denies leg swelling and calf pain. She has a good appetite.  She "ran out" of Norvasc 3 days ago.  Objective:  Vital signs in last 24 hours:  Blood pressure 185/100, pulse 73, temperature 97.2 F (36.2 C), temperature source Oral, resp. rate 19, height $RemoveBe'5\' 11"'lsJwlJGce$  (1.803 m), weight 251 lb (113.853 kg).  Repeat blood pressure 176/104.  HEENT: No thrush or ulcerations. Resp: Lungs clear. Cardio: Regular cardiac rhythm. GI: Soft and nontender. No hepatomegaly. Vascular: No leg edema.   Lab Results:  Lab Results  Component Value Date   WBC 5.0 08/16/2013   HGB 11.7 08/16/2013   HCT 35.6 08/16/2013   MCV 92.8 08/16/2013   PLT 187 08/16/2013   NEUTROABS 2.4 08/16/2013   08/01/2013 CEA 13.9.  Imaging:  No results found.  Medications: I have reviewed the patient's current medications.  Assessment/Plan: 1. Metastatic colon cancer (T4, N0, M1) adenocarcinoma of the sigmoid colon status post sigmoid colectomy and partial right hepatectomy 04/11/2012. K-ras wild-type on the initial codon 12 and 13 testing. K-ras Q61L mutation identified on extended testing. She completed cycle 1 CAPOX beginning 05/25/2012.  She completed cycle 8 CAPOX beginning 10/19/2012.  CEA on 10/19/2012 normal at 0.8.  CEA 6.1 on 01/11/2013  Restaging CT on 01/11/2013 consistent with multiple new pulmonary metastases  Cycle 1 of FOLFIRI/Avastin 01/18/2013; cycle 2 02/01/2013,cycle 3 02/16/2013.  CEA 5.0 on 03/01/2013.  Cycle 4 FOLFIRI/Avastin 03/01/2013.  Cycle 5 FOLFIRI/Avastin 03/15/2013.  Restaging chest CT 03/29/2013 with possible slight decrease in size of a left  upper lobe metastasis. Other pulmonary nodules stable.  Cycle 6 FOLFIRI/Avastin 03/29/2013  Cycle 7 of FOLFIRI/Avastin 04/12/2013.  CEA 6.3 05/03/2013.  Cycle 8 FOLFIRI/Avastin 05/03/2013.  Cycle 9 FOLFIRI/Avastin 05/17/2013.  Cycle 10 FOLFIRI/Avastin 05/31/2013.  CEA 10.6 on 06/19/2013.  CT chest 06/19/2013 with mild progression of bilateral pulmonary nodules/metastases.  Decision made to continue FOLFIRI/Avastin. She completed cycle 11 06/21/2013.  Cycle 12 FOLFIRI/Avastin 07/05/2013  Cycle 13 FOLFIRI/Avastin 07/19/2013 Cycle 14 FOLFIRI/Avastin 08/01/2013. 2. Microcytic anemia, iron deficiency. Improved.  3. Markedly elevated preoperative CEA. The CEA was mildly elevated on 05/16/2012. The CEA was in normal range at 0.5 on 08/17/2012 and 0.8 on 10/19/2012. Elevated at 6.1 on 01/11/2013.  4. Status post Port-A-Cath placement 03/14/2012. X-ray showed the Port-A-Cath tip to be in the azygos vein. The left-sided Port-A-Cath was removed and a new right Port-A-Cath was placed on 05/23/2012. 5. 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 6. Hypertension-persistent on amlodipine. Lisinopril 5 mg daily added on 03/01/2013. Lisinopril increased to 10 mg daily on 03/15/2013. Increased to 20 mg 07/19/2013 7. Delayed nausea following cycle 5 FOLFIRI/Avastin. Emend was added to the premedication regimen.   Disposition: She appears stable. The CEA was slightly higher on 08/01/2013. Plan to proceed with FOLFIRI today as scheduled and refer for a restaging chest CT in 2 weeks. Avastin will be held today due to an elevated blood pressure. She will resume Norvasc.  She will return for a followup visit on 08/30/2013. She will contact the office in the interim with any problems.  Plan reviewed with Dr. Benay Spice.    Owens Shark ANP/GNP-BC  08/16/2013  10:01 AM

## 2013-08-16 NOTE — Patient Instructions (Signed)
[facility] Discharge Instructions for Patients Receiving Chemotherapy  Today you received the following chemotherapy agents Leucovorin, Camptosar and 5FU.  To help prevent nausea and vomiting after your treatment, we encourage you to take your nausea medication.   If you develop nausea and vomiting that is not controlled by your nausea medication, call the clinic.   BELOW ARE SYMPTOMS THAT SHOULD BE REPORTED IMMEDIATELY:  *FEVER GREATER THAN 100.5 F  *CHILLS WITH OR WITHOUT FEVER  NAUSEA AND VOMITING THAT IS NOT CONTROLLED WITH YOUR NAUSEA MEDICATION  *UNUSUAL SHORTNESS OF BREATH  *UNUSUAL BRUISING OR BLEEDING  TENDERNESS IN MOUTH AND THROAT WITH OR WITHOUT PRESENCE OF ULCERS  *URINARY PROBLEMS  *BOWEL PROBLEMS  UNUSUAL RASH Items with * indicate a potential emergency and should be followed up as soon as possible.  Feel free to call the clinic you have any questions or concerns. The clinic phone number is [phone].

## 2013-08-18 ENCOUNTER — Ambulatory Visit (HOSPITAL_BASED_OUTPATIENT_CLINIC_OR_DEPARTMENT_OTHER): Payer: Medicaid Other

## 2013-08-18 VITALS — BP 163/95 | HR 68 | Temp 97.7°F

## 2013-08-18 DIAGNOSIS — C7800 Secondary malignant neoplasm of unspecified lung: Secondary | ICD-10-CM

## 2013-08-18 DIAGNOSIS — C189 Malignant neoplasm of colon, unspecified: Secondary | ICD-10-CM

## 2013-08-18 DIAGNOSIS — C187 Malignant neoplasm of sigmoid colon: Secondary | ICD-10-CM

## 2013-08-18 DIAGNOSIS — C787 Secondary malignant neoplasm of liver and intrahepatic bile duct: Principal | ICD-10-CM

## 2013-08-18 MED ORDER — HEPARIN SOD (PORK) LOCK FLUSH 100 UNIT/ML IV SOLN
500.0000 [IU] | Freq: Once | INTRAVENOUS | Status: AC | PRN
  Administered 2013-08-18: 500 [IU]
  Filled 2013-08-18: qty 5

## 2013-08-18 MED ORDER — SODIUM CHLORIDE 0.9 % IJ SOLN
10.0000 mL | INTRAMUSCULAR | Status: DC | PRN
  Administered 2013-08-18: 10 mL
  Filled 2013-08-18: qty 10

## 2013-08-25 ENCOUNTER — Ambulatory Visit (HOSPITAL_COMMUNITY): Payer: Medicaid Other

## 2013-08-27 ENCOUNTER — Other Ambulatory Visit: Payer: Self-pay | Admitting: Oncology

## 2013-08-28 ENCOUNTER — Encounter (HOSPITAL_COMMUNITY): Payer: Self-pay

## 2013-08-28 ENCOUNTER — Ambulatory Visit (HOSPITAL_COMMUNITY)
Admission: RE | Admit: 2013-08-28 | Discharge: 2013-08-28 | Disposition: A | Discharge status: Home / Self Care | Payer: Medicaid Other | Source: Ambulatory Visit | Attending: Nurse Practitioner | Admitting: Nurse Practitioner

## 2013-08-28 DIAGNOSIS — R911 Solitary pulmonary nodule: Secondary | ICD-10-CM | POA: Insufficient documentation

## 2013-08-28 DIAGNOSIS — C787 Secondary malignant neoplasm of liver and intrahepatic bile duct: Secondary | ICD-10-CM | POA: Insufficient documentation

## 2013-08-28 DIAGNOSIS — C189 Malignant neoplasm of colon, unspecified: Secondary | ICD-10-CM | POA: Insufficient documentation

## 2013-08-28 MED ORDER — IOHEXOL 300 MG/ML  SOLN
80.0000 mL | Freq: Once | INTRAMUSCULAR | Status: AC | PRN
  Administered 2013-08-28: 80 mL via INTRAVENOUS

## 2013-08-30 ENCOUNTER — Other Ambulatory Visit (HOSPITAL_BASED_OUTPATIENT_CLINIC_OR_DEPARTMENT_OTHER): Payer: Medicaid Other

## 2013-08-30 ENCOUNTER — Telehealth: Payer: Self-pay | Admitting: Oncology

## 2013-08-30 ENCOUNTER — Ambulatory Visit: Payer: Medicaid Other

## 2013-08-30 ENCOUNTER — Ambulatory Visit (HOSPITAL_BASED_OUTPATIENT_CLINIC_OR_DEPARTMENT_OTHER): Payer: Medicaid Other | Admitting: Oncology

## 2013-08-30 ENCOUNTER — Other Ambulatory Visit: Payer: Self-pay | Admitting: *Deleted

## 2013-08-30 VITALS — BP 166/86 | HR 77 | Temp 98.2°F | Resp 19 | Ht 71.0 in | Wt 250.2 lb

## 2013-08-30 DIAGNOSIS — C189 Malignant neoplasm of colon, unspecified: Secondary | ICD-10-CM

## 2013-08-30 DIAGNOSIS — C7800 Secondary malignant neoplasm of unspecified lung: Secondary | ICD-10-CM

## 2013-08-30 DIAGNOSIS — C187 Malignant neoplasm of sigmoid colon: Secondary | ICD-10-CM

## 2013-08-30 DIAGNOSIS — I10 Essential (primary) hypertension: Secondary | ICD-10-CM

## 2013-08-30 DIAGNOSIS — C787 Secondary malignant neoplasm of liver and intrahepatic bile duct: Secondary | ICD-10-CM

## 2013-08-30 DIAGNOSIS — R110 Nausea: Secondary | ICD-10-CM

## 2013-08-30 LAB — COMPREHENSIVE METABOLIC PANEL (CC13)
ALBUMIN: 4.1 g/dL (ref 3.5–5.0)
ALT: 31 U/L (ref 0–55)
AST: 29 U/L (ref 5–34)
Alkaline Phosphatase: 76 U/L (ref 40–150)
Anion Gap: 9 mEq/L (ref 3–11)
BILIRUBIN TOTAL: 0.42 mg/dL (ref 0.20–1.20)
BUN: 16.5 mg/dL (ref 7.0–26.0)
CO2: 23 mEq/L (ref 22–29)
Calcium: 10.2 mg/dL (ref 8.4–10.4)
Chloride: 109 mEq/L (ref 98–109)
Creatinine: 0.9 mg/dL (ref 0.6–1.1)
Glucose: 95 mg/dl (ref 70–140)
POTASSIUM: 3.8 meq/L (ref 3.5–5.1)
SODIUM: 141 meq/L (ref 136–145)
Total Protein: 7.8 g/dL (ref 6.4–8.3)

## 2013-08-30 LAB — CBC WITH DIFFERENTIAL/PLATELET
BASO%: 1 % (ref 0.0–2.0)
Basophils Absolute: 0.1 10*3/uL (ref 0.0–0.1)
EOS%: 2.1 % (ref 0.0–7.0)
Eosinophils Absolute: 0.1 10*3/uL (ref 0.0–0.5)
HCT: 33.9 % — ABNORMAL LOW (ref 34.8–46.6)
HGB: 11.2 g/dL — ABNORMAL LOW (ref 11.6–15.9)
LYMPH%: 34.9 % (ref 14.0–49.7)
MCH: 30.5 pg (ref 25.1–34.0)
MCHC: 32.9 g/dL (ref 31.5–36.0)
MCV: 92.6 fL (ref 79.5–101.0)
MONO#: 0.4 10*3/uL (ref 0.1–0.9)
MONO%: 6.7 % (ref 0.0–14.0)
NEUT#: 3.1 10*3/uL (ref 1.5–6.5)
NEUT%: 55.3 % (ref 38.4–76.8)
Platelets: 204 10*3/uL (ref 145–400)
RBC: 3.67 10*6/uL — AB (ref 3.70–5.45)
RDW: 16.4 % — ABNORMAL HIGH (ref 11.2–14.5)
WBC: 5.5 10*3/uL (ref 3.9–10.3)
lymph#: 1.9 10*3/uL (ref 0.9–3.3)

## 2013-08-30 NOTE — Telephone Encounter (Signed)
Gave pt appt for lab and MD for may 2015

## 2013-08-30 NOTE — Patient Instructions (Signed)
[facility] Discharge Instructions  RECOMMENDATIONS MADE BY THE CONSULTANT AND ANY TEST RESULTS WILL BE SENT TO YOUR REFERRING PHYSICIAN.   SPECIAL INSTRUCTIONS/FOLLOW-UP:  Follow up as scheduled. Your CEA results will be called to you when available.  Thank you for choosing [facility] to provide your oncology and hematology care.  To afford each patient quality time with our providers, please arrive at least 30 minutes before your scheduled appointment time.  With your help, our goal is to use those 30 minutes to complete the necessary work-up to ensure our physicians have the information they need to help with your evaluation and healthcare recommendations.     ___________________  Should you have questions after your visit to [facility], please contact our office at [phone] between the hours of 8:30 a.m. and 4:30 p.m.  Voicemails left after 4:00 p.m. will not be returned until the following business day.  For prescription refill requests, have your pharmacy contact our office with your prescription refill request. We request 24 hour notice for all refill requests.

## 2013-08-30 NOTE — Progress Notes (Signed)
[name]   Diagnosis: Metastatic colon cancer  INTERVAL HISTORY:   [name] returns as scheduled. She completed another cycle of FOLFIRI/Avastin 08/16/2013. Mild nausea for 2 days following chemotherapy. She continues to have numbness at the soles of the feet. No numbness of the fingers. The numbness is not interfere with activity.  Objective:  Vital signs in last 24 hours:  Blood pressure 174/101, pulse 77, temperature 98.2 F (36.8 C), temperature source Oral, resp. rate 19, height _0  (1.803 m), weight 250 lb 3.2 oz (113.49 kg), SpO2 100.00%.    HEENT: No thrush or ulcers Lymphatics: No cervical, supra-clavicular, or axillary nodes Resp: Lungs clear bilaterally Cardio: Regular rate and rhythm GI: No hepatomegaly Vascular: No leg edema  Skin: Hyperpigmentation hands   Portacath/PICC-without erythema  Lab Results:  Lab Results  Component Value Date   WBC 5.5 08/30/2013   HGB 11.2* 08/30/2013   HCT 33.9* 08/30/2013   MCV 92.6 08/30/2013   PLT 204 08/30/2013   NEUTROABS 3.1 08/30/2013    Lab Results  Component Value Date   CEA 18.3* 08/16/2013    Imaging:  CT of the chest 08/28/2013-interval increase in size of bilateral lung nodules Medications: I have reviewed the patient's current medications.  Assessment/Plan: 1. Metastatic colon cancer (T4, N0, M1) adenocarcinoma of the sigmoid colon status post sigmoid colectomy and partial right hepatectomy 04/11/2012. K-ras wild-type on the initial codon 12 and 13 testing. K-ras Q61L mutation identified on extended testing. She completed cycle 1 CAPOX beginning 05/25/2012.  She completed cycle 8 CAPOX beginning 10/19/2012.  CEA on 10/19/2012 normal at 0.8.  CEA 6.1 on 01/11/2013  Restaging CT on 01/11/2013 consistent with multiple new pulmonary metastases  Cycle 1 of FOLFIRI/Avastin 01/18/2013; cycle 2 02/01/2013,cycle 3 02/16/2013.  CEA 5.0 on 03/01/2013.  Cycle 4 FOLFIRI/Avastin  03/01/2013.  Cycle 5 FOLFIRI/Avastin 03/15/2013.  Restaging chest CT 03/29/2013 with possible slight decrease in size of a left upper lobe metastasis. Other pulmonary nodules stable.  Cycle 6 FOLFIRI/Avastin 03/29/2013  Cycle 7 of FOLFIRI/Avastin 04/12/2013.  CEA 6.3 05/03/2013.  Cycle 8 FOLFIRI/Avastin 05/03/2013.  Cycle 9 FOLFIRI/Avastin 05/17/2013.  Cycle 10 FOLFIRI/Avastin 05/31/2013.  CEA 10.6 on 06/19/2013.  CT chest 06/19/2013 with mild progression of bilateral pulmonary nodules/metastases.  Decision made to continue FOLFIRI/Avastin. She completed cycle 11 06/21/2013.  Cycle 12 FOLFIRI/Avastin 07/05/2013  Cycle 13 FOLFIRI/Avastin 07/19/2013  Cycle 14 FOLFIRI/Avastin 08/01/2013. CT chest 08/28/2013 with increased in size of bilateral lung nodules 2. Microcytic anemia, iron deficiency. Improved.  3. Markedly elevated preoperative CEA. The CEA was mildly elevated on 05/16/2012. The CEA was in normal range at 0.5 on 08/17/2012 and 0.8 on 10/19/2012. Elevated at 6.1 on 01/11/2013.  4. Status post Port-A-Cath placement 03/14/2012. X-ray showed the Port-A-Cath tip to be in the azygos vein. The left-sided Port-A-Cath was removed and a new right Port-A-Cath was placed on 05/23/2012. 5. 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 6. Hypertension-persistent on amlodipine. Lisinopril 5 mg daily added on 03/01/2013. Lisinopril increased to 10 mg daily on 03/15/2013. Increased to 20 mg 07/19/2013 7. Delayed nausea following cycle 5 FOLFIRI/Avastin. Emend was added to the premedication regimen.   Disposition:  [name] appears stable. We will repeat the blood pressure before she leaves the clinic today.  The restaging chest CT confirms enlargement of bilateral lung nodules and the CEA is slowly rising. I discussed the chest CT findings with [name]. She did not  wish to review the CT images. I reviewed the images and I can count 5  lung lesions.  I discussed treatment options with [name]. She maintains an excellent performance status and is a candidate for salvage therapy. We discussed 5-FU/Aflibercept and FOLFIRINOX. We also discussed a referral to consider a clinical trial at [facility] or [facility]. She has no transportation. We are checking into the availability of transportation services.  I also think it is reasonable to consider SRS to the bilateral lung lesions. She will be referred for a staging PET scan to look for disease outside of the chest.  I will present her case at the GI tumor conference 09/06/2013 and she will return for an office visit that day.  Ladell Pier, MD  08/30/2013  12:02 PM

## 2013-08-30 NOTE — [facility] Oncology Navigator Note (Signed)
Met with patient during today's clinic visit.  She was informed by MD that she has disease progression and that he is going to do a PET scan to help determine next steps of treatment.  Patient expressed understanding and will return to see MD next week for further treatment discussion. This RN spent time with patient and gave emotional support.  SW notified of possible need to assist patient with transportation is she requires a visit to outside hospital.

## 2013-08-31 ENCOUNTER — Encounter: Payer: Self-pay | Admitting: *Deleted

## 2013-08-31 LAB — CEA: CEA: 21.3 ng/mL — ABNORMAL HIGH (ref 0.0–5.0)

## 2013-08-31 NOTE — Progress Notes (Signed)
[place] Work  Clinical Social Work met with pt and phoned her after her last appointment to check in and offer her support. Pt does have medicaid which can provide transportation to out of town medical appointments with advance notice. RN and pt made aware of this option. Pt reports to be down, but processing her current situation. She shared her updates with family and is getting good support from her daughter who has been with her a lot today. Pt appreciative of the call and CSW team will keep checking on her to see how the plan unfolds in the next week.   Clinical Social Work interventions: Emotional support Resource education  [name], LCSW Clinical Social Worker [facility] for [place] Wednesday, Thursday and Friday Phone: [phone] Fax: [phone]

## 2013-09-06 ENCOUNTER — Telehealth: Payer: Self-pay | Admitting: Oncology

## 2013-09-06 ENCOUNTER — Other Ambulatory Visit (HOSPITAL_BASED_OUTPATIENT_CLINIC_OR_DEPARTMENT_OTHER): Payer: Medicaid Other

## 2013-09-06 ENCOUNTER — Ambulatory Visit (HOSPITAL_BASED_OUTPATIENT_CLINIC_OR_DEPARTMENT_OTHER): Payer: Medicaid Other | Admitting: Oncology

## 2013-09-06 VITALS — BP 138/95 | HR 68 | Temp 97.9°F | Resp 18 | Ht 71.0 in | Wt 251.8 lb

## 2013-09-06 DIAGNOSIS — C787 Secondary malignant neoplasm of liver and intrahepatic bile duct: Principal | ICD-10-CM

## 2013-09-06 DIAGNOSIS — C189 Malignant neoplasm of colon, unspecified: Secondary | ICD-10-CM

## 2013-09-06 DIAGNOSIS — C7800 Secondary malignant neoplasm of unspecified lung: Secondary | ICD-10-CM

## 2013-09-06 DIAGNOSIS — I10 Essential (primary) hypertension: Secondary | ICD-10-CM

## 2013-09-06 DIAGNOSIS — C187 Malignant neoplasm of sigmoid colon: Secondary | ICD-10-CM

## 2013-09-06 DIAGNOSIS — D509 Iron deficiency anemia, unspecified: Secondary | ICD-10-CM

## 2013-09-06 LAB — CBC WITH DIFFERENTIAL/PLATELET
BASO%: 1 % (ref 0.0–2.0)
BASOS ABS: 0 10*3/uL (ref 0.0–0.1)
EOS ABS: 0.2 10*3/uL (ref 0.0–0.5)
EOS%: 4.5 % (ref 0.0–7.0)
HEMATOCRIT: 37 % (ref 34.8–46.6)
HEMOGLOBIN: 12.2 g/dL (ref 11.6–15.9)
LYMPH#: 2 10*3/uL (ref 0.9–3.3)
LYMPH%: 42.5 % (ref 14.0–49.7)
MCH: 30 pg (ref 25.1–34.0)
MCHC: 32.9 g/dL (ref 31.5–36.0)
MCV: 91.3 fL (ref 79.5–101.0)
MONO#: 0.5 10*3/uL (ref 0.1–0.9)
MONO%: 11.5 % (ref 0.0–14.0)
NEUT#: 1.9 10*3/uL (ref 1.5–6.5)
NEUT%: 40.5 % (ref 38.4–76.8)
PLATELETS: 225 10*3/uL (ref 145–400)
RBC: 4.05 10*6/uL (ref 3.70–5.45)
RDW: 16.9 % — ABNORMAL HIGH (ref 11.2–14.5)
WBC: 4.7 10*3/uL (ref 3.9–10.3)

## 2013-09-06 LAB — COMPREHENSIVE METABOLIC PANEL (CC13)
ALT: 32 U/L (ref 0–55)
AST: 29 U/L (ref 5–34)
Albumin: 4 g/dL (ref 3.5–5.0)
Alkaline Phosphatase: 79 U/L (ref 40–150)
Anion Gap: 13 mEq/L — ABNORMAL HIGH (ref 3–11)
BILIRUBIN TOTAL: 0.37 mg/dL (ref 0.20–1.20)
BUN: 15.6 mg/dL (ref 7.0–26.0)
CHLORIDE: 105 meq/L (ref 98–109)
CO2: 21 mEq/L — ABNORMAL LOW (ref 22–29)
CREATININE: 1 mg/dL (ref 0.6–1.1)
Calcium: 10.5 mg/dL — ABNORMAL HIGH (ref 8.4–10.4)
GLUCOSE: 104 mg/dL (ref 70–140)
Potassium: 4.1 mEq/L (ref 3.5–5.1)
Sodium: 139 mEq/L (ref 136–145)
Total Protein: 7.8 g/dL (ref 6.4–8.3)

## 2013-09-06 LAB — UA PROTEIN, DIPSTICK - CHCC

## 2013-09-06 NOTE — [facility] Oncology Navigator Note (Signed)
Met with patient during clinic visit.  She states she is feeling better.  She denies need for any services not already provided during this time.  She was given new appointment for Dr. Lisbeth Renshaw and verbalized understanding.  Will continue to follow.

## 2013-09-06 NOTE — Telephone Encounter (Signed)
gv adn printed appt sched and avs for pt for May and June °

## 2013-09-06 NOTE — Progress Notes (Signed)
[facility] OFFICE PROGRESS NOTE   Diagnosis: Colon cancer  INTERVAL HISTORY:   [name] returns as scheduled. She feels well. No complaint.  Objective:  Vital signs in last 24 hours:  Blood pressure 138/95, pulse 68, temperature 97.9 F (36.6 C), temperature source Oral, resp. rate 18, height $RemoveBe'5\' 11"'NGArEHttQ$  (1.803 m), weight 251 lb 12.8 oz (114.216 kg), SpO2 100.00%.    HEENT:  Neck without mass, no thrush or ulcer Lymphatics: No cervical, supraclavicular, axillary, or inguinal nodes Resp: Lungs clear bilaterally Cardio: Regular rate and rhythm GI: No hepatosplenomegaly, nontender Vascular: No leg edema  Skin: Hyperpigmentation of the hands   Portacath/PICC-without erythema  Lab Results:  Lab Results  Component Value Date   WBC 4.7 09/06/2013   HGB 12.2 09/06/2013   HCT 37.0 09/06/2013   MCV 91.3 09/06/2013   PLT 225 09/06/2013   NEUTROABS 1.9 09/06/2013   Lab Results  Component Value Date   CEA 21.3* 08/30/2013    Medications: I have reviewed the patient's current medications.  Assessment/Plan: 1. Metastatic colon cancer (T4, N0, M1) adenocarcinoma of the sigmoid colon status post sigmoid colectomy and partial right hepatectomy 04/11/2012. K-ras wild-type on the initial codon 12 and 13 testing. K-ras Q61L mutation identified on extended testing. She completed cycle 1 CAPOX beginning 05/25/2012.  She completed cycle 8 CAPOX beginning 10/19/2012.  CEA on 10/19/2012 normal at 0.8.  CEA 6.1 on 01/11/2013  Restaging CT on 01/11/2013 consistent with multiple new pulmonary metastases  Cycle 1 of FOLFIRI/Avastin 01/18/2013; cycle 2 02/01/2013,cycle 3 02/16/2013.  CEA 5.0 on 03/01/2013.  Cycle 4 FOLFIRI/Avastin 03/01/2013.  Cycle 5 FOLFIRI/Avastin 03/15/2013.  Restaging chest CT 03/29/2013 with possible slight decrease in size of a left upper lobe metastasis. Other pulmonary nodules stable.  Cycle 6 FOLFIRI/Avastin 03/29/2013  Cycle 7 of FOLFIRI/Avastin  04/12/2013.  CEA 6.3 05/03/2013.  Cycle 8 FOLFIRI/Avastin 05/03/2013.  Cycle 9 FOLFIRI/Avastin 05/17/2013.  Cycle 10 FOLFIRI/Avastin 05/31/2013.  CEA 10.6 on 06/19/2013.  CT chest 06/19/2013 with mild progression of bilateral pulmonary nodules/metastases.  Decision made to continue FOLFIRI/Avastin. She completed cycle 11 06/21/2013.  Cycle 12 FOLFIRI/Avastin 07/05/2013  Cycle 13 FOLFIRI/Avastin 07/19/2013  Cycle 14 FOLFIRI/Avastin 08/01/2013.  CT chest 08/28/2013 with increased in size of bilateral lung nodules 2. Microcytic anemia, iron deficiency. Improved.  3. Markedly elevated preoperative CEA. The CEA was mildly elevated on 05/16/2012. The CEA was in normal range at 0.5 on 08/17/2012 and 0.8 on 10/19/2012. Elevated at 6.1 on 01/11/2013.  4. Status post Port-A-Cath placement 03/14/2012. X-ray showed the Port-A-Cath tip to be in the azygos vein. The left-sided Port-A-Cath was removed and a new right Port-A-Cath was placed on 05/23/2012. 5. 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 6. Hypertension-persistent on amlodipine. Lisinopril 5 mg daily added on 03/01/2013. Lisinopril increased to 10 mg daily on 03/15/2013. Increased to 20 mg 07/19/2013 7. Delayed nausea following cycle 5 FOLFIRI/Avastin. Emend was added to the premedication regimen.   Disposition:  Her case was presented at the GI tumor conference 09/06/2013. The 08/28/2013 chest CT was reviewed. There appeared to be 5 lung lesions. The lesions have enlarged compared to the previous chest CTs, there did not appear to be new lesions.  Systemic treatment options are limited. We will make a referral to Dr. Lisbeth Renshaw to consider SRS to the lung lesions.  I discussed treatment options with [name]. We discussed regorafenib. She understands the [facility] is unlikely to be curative, but may  prolong her disease-free survival. We will consider regorafenib after the [facility]  therapy.  She is scheduled for a PET scan 09/11/2013. The plan is to proceed with SRS if there is no evidence of metastatic disease outside of the chest.  Ladell Pier, MD  09/06/2013  9:10 AM

## 2013-09-08 ENCOUNTER — Other Ambulatory Visit: Payer: Self-pay | Admitting: Nurse Practitioner

## 2013-09-10 ENCOUNTER — Other Ambulatory Visit: Payer: Self-pay | Admitting: Oncology

## 2013-09-11 ENCOUNTER — Ambulatory Visit (HOSPITAL_COMMUNITY): Payer: Medicaid Other

## 2013-09-12 NOTE — Progress Notes (Signed)
Thoracic Location of Tumor / Histology: Lung 5 lesions  Patient presented  months ago with symptoms of: was in hospital and found lung nodules on Ct chest  Biopsies of  (if applicable) revealed:   Tobacco/Marijuana/Snuff/ETOH use: none  Past/Anticipated interventions by  surgery, if any: Sigmoid colectomy and right partial hepatectomy 06/13/11 K-ras wild type   Past/Anticipated interventions by medical oncology, if DJS:[id number] Capox, Folfiri/Avastin, completed for colon cancer  GI tumor conference 09/06/13, referral by Dr. Benay Spice for [facility] to the lung lesions,Pet scan scheduled 09/22/13, follow up 10/04/13  Signs/Symptoms  Weight changes, if any: no  Respiratory complaints, if any: no  Hemoptysis, if any: no  Pain issues, if any:  no  SAFETY ISSUES:  Prior radiation? No  Pacemaker/ICD? No  Possible current pregnancy? No ,  Total hysterectomy early 50's  Is the patient on methotrexate?   Current Complaints / other details:  Metastatic Colon Cancer, to liver,  04/11/12  CT 08/28/13: - Right upper lobe nodule measures 15 mm (image 18, series 5)  compared to 13 mm on prior.  - Right upper lobe nodule measures 6 mm (image 20, series 5)  compared to 6 mm on prior.  - Nodule within the superior segment of the left lower lobe is  clearly increased measuring 9 mm (image 26, series 5) compared to 4  mm on prior  - 16 mm nodule in the lingula (image 23) compares to 13 mm on prior.  Limited view of the upper abdomen demonstrates fatty changes within  the liver. No enhancing lesions in identify and although the entire  liver is not imaged. Adrenal glands are normal. Kidneys are  partially imaged and are normal. No aggressive osseous lesion.  IMPRESSION:  1. Interval increase in size of bilateral pulmonary nodules.  2. No new nodules are identified.  3. No evidence of metastatic adenopathy Mother breast cancer age 53, widow 2 children

## 2013-09-13 ENCOUNTER — Ambulatory Visit
Admission: RE | Admit: 2013-09-13 | Discharge: 2013-09-13 | Disposition: A | Discharge status: Home / Self Care | Payer: Medicaid Other | Source: Ambulatory Visit | Attending: Radiation Oncology | Admitting: Radiation Oncology

## 2013-09-13 ENCOUNTER — Encounter: Payer: Self-pay | Admitting: Radiation Oncology

## 2013-09-13 VITALS — BP 161/99 | HR 80 | Temp 98.4°F | Resp 20 | Ht 71.0 in | Wt 251.9 lb

## 2013-09-13 DIAGNOSIS — C3410 Malignant neoplasm of upper lobe, unspecified bronchus or lung: Secondary | ICD-10-CM

## 2013-09-13 DIAGNOSIS — C787 Secondary malignant neoplasm of liver and intrahepatic bile duct: Secondary | ICD-10-CM | POA: Diagnosis not present

## 2013-09-13 DIAGNOSIS — C801 Malignant (primary) neoplasm, unspecified: Secondary | ICD-10-CM

## 2013-09-13 DIAGNOSIS — C7800 Secondary malignant neoplasm of unspecified lung: Secondary | ICD-10-CM | POA: Diagnosis not present

## 2013-09-13 DIAGNOSIS — Z510 Encounter for antineoplastic radiation therapy: Secondary | ICD-10-CM | POA: Insufficient documentation

## 2013-09-13 DIAGNOSIS — C189 Malignant neoplasm of colon, unspecified: Secondary | ICD-10-CM | POA: Insufficient documentation

## 2013-09-13 HISTORY — DX: Anxiety disorder, unspecified: F41.9

## 2013-09-13 HISTORY — DX: Myoneural disorder, unspecified: G70.9

## 2013-09-13 NOTE — Progress Notes (Signed)
Please see the Nurse Progress Note in the MD Initial Consult Encounter for this patient. 

## 2013-09-14 DIAGNOSIS — C7800 Secondary malignant neoplasm of unspecified lung: Secondary | ICD-10-CM | POA: Insufficient documentation

## 2013-09-14 NOTE — Progress Notes (Signed)
Radiation Oncology         ([phone]) [phone] ________________________________  Name: [name] MRN: [id number]  Date: 09/13/2013  DOB: 1956/01/15  ER:[id number], Blanch Media, MD  Ladell Pier, MD     REFERRING PHYSICIAN: Ladell Pier, MD   DIAGNOSIS: The primary encounter diagnosis was Malignant neoplasm of upper lobe, bronchus or lung. A diagnosis of Other malignant neoplasm without specification of site was also pertinent to this visit.   HISTORY OF PRESENT ILLNESS::[name] is a 58 y.o. female who is seen for an initial consultation visit. The patient is seen today regarding her diagnosis of metastatic colon cancer. The patient was initially diagnosed in 2013 and underwent a sigmoid colectomy and partial right hepatectomy in December of 2013. This corresponded to a T4N0M1 adenocarcinoma of the sigmoid colon. The patient has undergone multiple rounds of systemic treatment since that time. Overall the patient has done quite well with this.   The patient was noted to have new pulmonary metastases in September of 2014. These have continued to be followed and haven't increased in size. Most recently she underwent a CT scan of the chest on 5 or 2015. There is an interval increase in the size of the bilateral pulmonary nodules. No new nodules were identified and no evidence of metastatic adenopathy. The patient has 5 lesions which represents metastatic deposits within the chest.  The patient states that she is doing well from a symptomatic standpoint. She denies any respiratory complaints. No cough, no significant shortness of breath, and no chest pain.  The patient's case has been discussed in multidisciplinary GI conference and I have been asked to see the patient to evaluate her for possible palliative radiation treatment to the chest given her diagnosis at this time of oligometastatic disease.   PREVIOUS RADIATION THERAPY: No   PAST MEDICAL HISTORY:  has a past medical history of  Hypertension; Cancer; Blood in stool; History of blood transfusion; Anemia; Eczema; Heart murmur; Metastatic colon cancer to liver; Anxiety; and Neuromuscular disorder.     PAST SURGICAL HISTORY: Past Surgical History  Procedure Laterality Date  . Breast lumpectomy      left breast  . Colonoscopy  02/18/2012    Procedure: COLONOSCOPY;  Surgeon: Beryle Beams, MD;  Location: [facility];  Service: Endoscopy;  Laterality: N/A;  . Portacath placement  03/14/2012    Procedure: INSERTION PORT-A-CATH;  Surgeon: Ralene Ok, MD;  Location: [facility];  Service: General;  Laterality: N/A;  . Laparoscopic sigmoid colectomy  04/11/2012    Procedure: LAPAROSCOPIC SIGMOID COLECTOMY;  Surgeon: Stark Klein, MD;  Location: [facility];  Service: General;;  converted to Open with splenic flexure take down  . Open partial hepatectomy [83]  04/11/2012    Procedure: OPEN PARTIAL HEPATECTOMY [83];  Surgeon: Stark Klein, MD;  Location: [facility];  Service: General;  Laterality: Right;  . Laparoscopy  04/11/2012    Procedure: LAPAROSCOPY DIAGNOSTIC;  Surgeon: Stark Klein, MD;  Location: [facility];  Service: General;  Laterality: N/A;  . Cholecystectomy  04/11/2012    Procedure: CHOLECYSTECTOMY;  Surgeon: Stark Klein, MD;  Location: [facility];  Service: General;;  . Ileocecetomy  04/11/2012    Procedure: Pennie Rushing;  Surgeon: Stark Klein, MD;  Location: [facility];  Service: General;  Laterality: N/A;  . Port a cath revision  05/23/2012    Procedure: PORT A CATH REVISION;  Surgeon: Ralene Ok, MD;  Location: [facility];  Service: General;  Laterality: Right;  Port-A-Cath repositioning versus replacement  .  Abdominal hysterectomy      early 30's     FAMILY HISTORY: family history includes Cancer (age of onset: 51) in her mother; Heart disease in her father; Hypertension in her mother.   SOCIAL HISTORY:  reports that she has never smoked. She has never used smokeless tobacco. She reports that she does not drink alcohol or  use illicit drugs.   ALLERGIES: Review of patient's allergies indicates no known allergies.   MEDICATIONS:  Current Outpatient Prescriptions  Medication Sig Dispense Refill  . amLODipine (NORVASC) 10 MG tablet Take 1 tablet (10 mg total) by mouth daily.  30 tablet  3  . ferrous sulfate 325 (65 FE) MG tablet Take 325 mg by mouth 2 (two) times daily.      Marland Kitchen lidocaine-prilocaine (EMLA) cream Apply topically as needed. Apply to [facility] site 1-2 hours prior to stick and cover with plastic wrap to numb site  30 g  3  . lisinopril (PRINIVIL,ZESTRIL) 20 MG tablet Take 1 tablet (20 mg total) by mouth daily.  90 tablet  0  . loperamide (IMODIUM) 2 MG capsule TAKE 2 CAPSULES AT FIRST LOOSE STOOL. THEN 1 EVERY 2 HOURS. TAKE 2 AT BEDTIME.STOP WHEN YOU HAVE NOT HAD BOWEL MOVEMENT IN 12 HOURS  30 capsule  0  . potassium chloride SA (K-DUR,KLOR-CON) 20 MEQ tablet Take 1 tablet (20 mEq total) by mouth daily.  31 tablet  1  . prochlorperazine (COMPAZINE) 10 MG tablet TAKE 1 TABLET BY MOUTH EVERY 6 HOURS AS NEEDED FOR NAUSEA OR VOMITING  30 tablet  1  . Rivaroxaban (XARELTO) 20 MG TABS tablet Take 1 tablet (20 mg total) by mouth daily. Call pharmacy for refill request  30 tablet  5  . polyethylene glycol powder (MIRALAX) powder Take 17 g by mouth daily as needed.  255 g  0   No current facility-administered medications for this encounter.     REVIEW OF SYSTEMS:  A 15 point review of systems is documented in the electronic medical record. This was obtained by the nursing staff. However, I reviewed this with the patient to discuss relevant findings and make appropriate changes.  Pertinent items are noted in HPI.    PHYSICAL EXAM:  height is 5\' 11"  (1.803 m) and weight is 251 lb 14.4 oz (114.261 kg). Her oral temperature is 98.4 F (36.9 C). Her blood pressure is 161/99 and her pulse is 80. Her respiration is 20 and oxygen saturation is 100%.   ECOG = 1  0 - Asymptomatic (Fully active, able to carry on all  predisease activities without restriction)  1 - Symptomatic but completely ambulatory (Restricted in physically strenuous activity but ambulatory and able to carry out work of a light or sedentary nature. For example, light housework, office work)  2 - Symptomatic, <50% in bed during the day (Ambulatory and capable of all self care but unable to carry out any work activities. Up and about more than 50% of waking hours)  3 - Symptomatic, >50% in bed, but not bedbound (Capable of only limited self-care, confined to bed or chair 50% or more of waking hours)  4 - Bedbound (Completely disabled. Cannot carry on any self-care. Totally confined to bed or chair)  5 - Death   Eustace Pen MM, Creech RH, Tormey DC, et al. [id number]). "Toxicity and response criteria of the [facility] Group". [place] Oncol. 5 (6): 649-55  General: Well-developed, in no acute distress HEENT: Normocephalic, atraumatic; oral cavity clear Neck:  Supple without any lymphadenopathy Cardiovascular: Regular rate and rhythm Respiratory: Clear to auscultation bilaterally GI: Soft, nontender, normal bowel sounds Extremities: No edema present Neuro: No focal deficits     LABORATORY DATA:  Lab Results  Component Value Date   WBC 4.7 09/06/2013   HGB 12.2 09/06/2013   HCT 37.0 09/06/2013   MCV 91.3 09/06/2013   PLT 225 09/06/2013   Lab Results  Component Value Date   NA 139 09/06/2013   K 4.1 09/06/2013   CL 103 02/15/2013   CO2 21* 09/06/2013   Lab Results  Component Value Date   ALT 32 09/06/2013   AST 29 09/06/2013   ALKPHOS 79 09/06/2013   BILITOT 0.37 09/06/2013      RADIOGRAPHY: Ct Chest W Contrast  08/28/2013   CLINICAL DATA:  Cancer, chemotherapy in progress  EXAM: CT CHEST WITH CONTRAST  TECHNIQUE: Multidetector CT imaging of the chest was performed during intravenous contrast administration.  CONTRAST:  44mL OMNIPAQUE IOHEXOL 300 MG/ML  SOLN  COMPARISON:  None.  FINDINGS: No axillary supraclavicular  lymphadenopathy. There is a port in the right chest wall. There are several low-density lesions in the thyroid gland unchanged from prior. The largest in the right lobe measures 18 mm. No mediastinal hilar lymphadenopathy.  There are again demonstrated multiple bilateral round pulmonary nodules:  - Right upper lobe nodule measures 15 mm (image 18, series 5) compared to 13 mm on prior.  - Right upper lobe nodule measures 6 mm (image 20, series 5) compared to 6 mm on prior.  - Nodule within the superior segment of the left lower lobe is clearly increased measuring 9 mm (image 26, series 5) compared to 4 mm on prior  - 16 mm nodule in the lingula (image 23) compares to 13 mm on prior.  Limited view of the upper abdomen demonstrates fatty changes within the liver. No enhancing lesions in identify and although the entire liver is not imaged. Adrenal glands are normal. Kidneys are partially imaged and are normal. No aggressive osseous lesion.  IMPRESSION: 1. Interval increase in size of bilateral pulmonary nodules. 2. No new nodules are identified. 3. No evidence of metastatic adenopathy.   Electronically Signed   By: Suzy Bouchard M.D.   On: 08/28/2013 08:36       IMPRESSION: The patient has a diagnosis of stage IV adenocarcinoma of the sigmoid colon. She has done very well with systemic treatment but does have some progression of pulmonary metastases. The patient does not have any known active disease outside the chest. Her case was discussed in multidisciplinary GI conference. The consensus was that she may benefit from stereotactic body radiation treatment to her pulmonary metastases which represent the extent of her oligometastatic disease. It was recommended to proceed with a PET scan prior to such treatment however to ensure that she does not have extrathoracic disease which is progressive. The patient has been scheduled for a PET scan on 09/22/2013.  I discussed with the patient the possible role for  stereotactic body radiation treatment in this setting. We did discuss in detail the extent of her disease and the possible benefits and limits of such a course of radiation treatment. We also discussed the possible side effects and risks of treatment as well. All of her questions were answered.  The patient is interested in proceeding with treatment if she does not have additional disease on her upcoming PET scan.   PLAN: I will followup on her PET scan results and  we will proceed with simulation/treatment planning if she does not have additional findings on her PET scan.      ________________________________   Jodelle Gross, MD, PhD   **Disclaimer: This note was dictated with voice recognition software. Similar sounding words can inadvertently be transcribed and this note may contain transcription errors which may not have been corrected upon publication of note.**

## 2013-09-22 ENCOUNTER — Ambulatory Visit
Admission: RE | Admit: 2013-09-22 | Discharge: 2013-09-22 | Disposition: A | Discharge status: Home / Self Care | Payer: Medicaid Other | Source: Ambulatory Visit | Attending: Radiation Oncology | Admitting: Radiation Oncology

## 2013-09-22 ENCOUNTER — Encounter (HOSPITAL_COMMUNITY)
Admission: RE | Admit: 2013-09-22 | Discharge: 2013-09-22 | Disposition: A | Discharge status: Home / Self Care | Payer: Medicaid Other | Source: Ambulatory Visit | Attending: Oncology | Admitting: Oncology

## 2013-09-22 ENCOUNTER — Encounter: Payer: Self-pay | Admitting: *Deleted

## 2013-09-22 DIAGNOSIS — C801 Malignant (primary) neoplasm, unspecified: Secondary | ICD-10-CM

## 2013-09-22 DIAGNOSIS — R911 Solitary pulmonary nodule: Secondary | ICD-10-CM | POA: Insufficient documentation

## 2013-09-22 DIAGNOSIS — K7689 Other specified diseases of liver: Secondary | ICD-10-CM | POA: Insufficient documentation

## 2013-09-22 DIAGNOSIS — C787 Secondary malignant neoplasm of liver and intrahepatic bile duct: Secondary | ICD-10-CM | POA: Insufficient documentation

## 2013-09-22 DIAGNOSIS — Z9049 Acquired absence of other specified parts of digestive tract: Secondary | ICD-10-CM | POA: Insufficient documentation

## 2013-09-22 DIAGNOSIS — C189 Malignant neoplasm of colon, unspecified: Secondary | ICD-10-CM | POA: Insufficient documentation

## 2013-09-22 DIAGNOSIS — Z510 Encounter for antineoplastic radiation therapy: Secondary | ICD-10-CM | POA: Diagnosis not present

## 2013-09-22 LAB — GLUCOSE, CAPILLARY: Glucose-Capillary: 86 mg/dL (ref 70–99)

## 2013-09-22 MED ORDER — FLUDEOXYGLUCOSE F - 18 (FDG) INJECTION
11.0000 | Freq: Once | INTRAVENOUS | Status: AC | PRN
  Administered 2013-09-22: 11 via INTRAVENOUS

## 2013-09-22 NOTE — Progress Notes (Signed)
[facility] Work  Clinical Social Work met with pt in lobby to check in, offer support and reassess needs. Pt reports she is doing pretty well and was getting ready to start radiation. She appeared to be in good spirits currently. CSW inquired about current financial concerns, as this has been a concern before. Pt denies any issues currently. She reports her children are still a good support and light for her as she continues on with her treatment. Pt very appreciative of check in and agrees to call CSW as needed.   Clinical Social Work interventions: Reassessment  Supportive listening and emotional support  [name], [place] Social Worker [facility] for [place] Wednesday, Thursday and Friday Phone: [phone] Fax: [phone]

## 2013-10-04 ENCOUNTER — Telehealth: Payer: Self-pay | Admitting: Oncology

## 2013-10-04 ENCOUNTER — Ambulatory Visit (HOSPITAL_BASED_OUTPATIENT_CLINIC_OR_DEPARTMENT_OTHER): Payer: Medicaid Other | Admitting: Nurse Practitioner

## 2013-10-04 ENCOUNTER — Ambulatory Visit (HOSPITAL_BASED_OUTPATIENT_CLINIC_OR_DEPARTMENT_OTHER): Payer: Medicaid Other

## 2013-10-04 VITALS — BP 160/93 | HR 64 | Temp 98.5°F | Resp 18 | Ht 71.0 in | Wt 253.8 lb

## 2013-10-04 DIAGNOSIS — C187 Malignant neoplasm of sigmoid colon: Secondary | ICD-10-CM

## 2013-10-04 DIAGNOSIS — D509 Iron deficiency anemia, unspecified: Secondary | ICD-10-CM

## 2013-10-04 DIAGNOSIS — Z510 Encounter for antineoplastic radiation therapy: Secondary | ICD-10-CM | POA: Diagnosis not present

## 2013-10-04 DIAGNOSIS — Z95828 Presence of other vascular implants and grafts: Secondary | ICD-10-CM

## 2013-10-04 DIAGNOSIS — C787 Secondary malignant neoplasm of liver and intrahepatic bile duct: Principal | ICD-10-CM

## 2013-10-04 DIAGNOSIS — C7800 Secondary malignant neoplasm of unspecified lung: Secondary | ICD-10-CM

## 2013-10-04 DIAGNOSIS — C189 Malignant neoplasm of colon, unspecified: Secondary | ICD-10-CM

## 2013-10-04 MED ORDER — SODIUM CHLORIDE 0.9 % IJ SOLN
10.0000 mL | INTRAMUSCULAR | Status: DC | PRN
  Administered 2013-10-04: 10 mL via INTRAVENOUS
  Filled 2013-10-04: qty 10

## 2013-10-04 MED ORDER — HEPARIN SOD (PORK) LOCK FLUSH 100 UNIT/ML IV SOLN
500.0000 [IU] | Freq: Once | INTRAVENOUS | Status: AC
  Administered 2013-10-04: 500 [IU] via INTRAVENOUS
  Filled 2013-10-04: qty 5

## 2013-10-04 NOTE — Telephone Encounter (Signed)
gv adn printed appt sched and avs for pt for June adn July

## 2013-10-04 NOTE — Progress Notes (Signed)
[facility] OFFICE PROGRESS NOTE   Diagnosis:  Colon cancer.  INTERVAL HISTORY:   Ms. Milholland returns as scheduled. She feels well. She has a good appetite. No nausea or vomiting. No mouth sores. No diarrhea. She denies pain. No shortness of breath or cough.  Objective:  Vital signs in last 24 hours:  Blood pressure 160/93, pulse 64, temperature 98.5 F (36.9 C), temperature source Oral, resp. rate 18, height _0  (1.803 m), weight 253 lb 12.8 oz (115.123 kg).    HEENT: No thrush or ulcerations. Resp: Lungs clear. Cardio: Regular cardiac rhythm. GI: Abdomen soft and nontender. No hepatomegaly. Vascular: No leg edema. Calves nontender.   Port-A-Cath site without erythema.    Lab Results:  Lab Results  Component Value Date   WBC 4.7 09/06/2013   HGB 12.2 09/06/2013   HCT 37.0 09/06/2013   MCV 91.3 09/06/2013   PLT 225 09/06/2013   NEUTROABS 1.9 09/06/2013    Imaging:  No results found.  Medications: I have reviewed the patient's current medications.  Assessment/Plan: 1. Metastatic colon cancer (T4, N0, M1) adenocarcinoma of the sigmoid colon status post sigmoid colectomy and partial right hepatectomy 04/11/2012. K-ras wild-type on the initial codon 12 and 13 testing. K-ras Q61L mutation identified on extended testing. She completed cycle 1 CAPOX beginning 05/25/2012.  She completed cycle 8 CAPOX beginning 10/19/2012.  CEA on 10/19/2012 normal at 0.8.  CEA 6.1 on 01/11/2013  Restaging CT on 01/11/2013 consistent with multiple new pulmonary metastases  Cycle 1 of FOLFIRI/Avastin 01/18/2013; cycle 2 02/01/2013,cycle 3 02/16/2013.  CEA 5.0 on 03/01/2013.  Cycle 4 FOLFIRI/Avastin 03/01/2013.  Cycle 5 FOLFIRI/Avastin 03/15/2013.  Restaging chest CT 03/29/2013 with possible slight decrease in size of a left upper lobe metastasis. Other pulmonary nodules stable.  Cycle 6 FOLFIRI/Avastin 03/29/2013  Cycle 7 of FOLFIRI/Avastin 04/12/2013.  CEA 6.3 05/03/2013.    Cycle 8 FOLFIRI/Avastin 05/03/2013.  Cycle 9 FOLFIRI/Avastin 05/17/2013.  Cycle 10 FOLFIRI/Avastin 05/31/2013.  CEA 10.6 on 06/19/2013.  CT chest 06/19/2013 with mild progression of bilateral pulmonary nodules/metastases.  Decision made to continue FOLFIRI/Avastin. She completed cycle 11 06/21/2013.  Cycle 12 FOLFIRI/Avastin 07/05/2013  Cycle 13 FOLFIRI/Avastin 07/19/2013  Cycle 14 FOLFIRI/Avastin 08/01/2013.  CT chest 08/28/2013 with increased in size of bilateral lung nodules. PET scan 09/22/2013 with multiple hypermetabolic pulmonary nodules bilaterally. Nodules demonstrate progressive enlargement compared with prior CTs. No evidence of extrathoracic metastatic disease. 2. Microcytic anemia, iron deficiency. Improved.  3. Markedly elevated preoperative CEA. The CEA was mildly elevated on 05/16/2012. The CEA was in normal range at 0.5 on 08/17/2012 and 0.8 on 10/19/2012. Elevated at 6.1 on 01/11/2013.  4. Status post Port-A-Cath placement 03/14/2012. X-ray showed the Port-A-Cath tip to be in the azygos vein. The left-sided Port-A-Cath was removed and a new right Port-A-Cath was placed on 05/23/2012. 5. 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 6. Hypertension-persistent on amlodipine. Lisinopril 5 mg daily added on 03/01/2013. Lisinopril increased to 10 mg daily on 03/15/2013. Increased to 20 mg 07/19/2013. 7. Delayed nausea following cycle 5 FOLFIRI/Avastin. Emend was added to the premedication regimen.   Disposition: [name] appears stable. The recent PET scan showed no evidence of metastatic disease outside of the chest. She has seen Dr. Lisbeth Renshaw and is scheduled to begin [facility] to pulmonary metastases on 10/06/2013. She will return for a followup visit here in approximately 4 weeks with a CEA. She will contact the office in the interim with any  problems.  Plan reviewed with Dr. Benay Spice.    Ned Card ANP/GNP-BC    10/04/2013  10:50 AM

## 2013-10-05 DIAGNOSIS — Z510 Encounter for antineoplastic radiation therapy: Secondary | ICD-10-CM | POA: Diagnosis not present

## 2013-10-06 ENCOUNTER — Ambulatory Visit
Admission: RE | Admit: 2013-10-06 | Discharge: 2013-10-06 | Disposition: A | Discharge status: Home / Self Care | Payer: Medicaid Other | Source: Ambulatory Visit | Attending: Radiation Oncology | Admitting: Radiation Oncology

## 2013-10-06 DIAGNOSIS — C801 Malignant (primary) neoplasm, unspecified: Secondary | ICD-10-CM

## 2013-10-06 DIAGNOSIS — Z510 Encounter for antineoplastic radiation therapy: Secondary | ICD-10-CM | POA: Diagnosis not present

## 2013-10-06 NOTE — Progress Notes (Signed)
Md saw patient in the back Linac#1 per Drue Dun, rad RT, not sent to nursing for assesment 11:35 AM

## 2013-10-08 NOTE — Progress Notes (Signed)
   Radiation Oncology         [phone]) [phone] ________________________________  Name: [name] MRN: [id number]  Date: 10/06/2013  DOB: 10-May-1955  Stereotactic Body Radiotherapy Treatment Procedure Note   NARRATIVE: [name] was brought to the stereotactic radiation treatment machine and placed supine on the CT couch. The patient was set up for stereotactic body radiotherapy on the body fix pillow.   3D TREATMENT PLANNING AND DOSIMETRY: The patient's radiation plan was reviewed and approved prior to starting treatment. It showed 3-dimensional radiation distributions overlaid onto the planning CT. The [facility] for the target structures as well as the organs at risk were reviewed. The documentation of this is filed in the radiation oncology EMR.   SIMULATION VERIFICATION: The patient underwent CT imaging on the treatment unit. These were carefully aligned to document that the ablative radiation dose would cover the target volume and maximally spare the nearby organs at risk according to the planned distribution.   SPECIAL TREATMENT PROCEDURE: [name] received high dose ablative stereotactic body radiotherapy to the planned target volume without unforeseen complications. Treatment was delivered uneventfully. The high doses associated with stereotactic body radiotherapy and the significant potential risks require careful treatment set up and patient monitoring constituting a special treatment procedure.   STEREOTACTIC TREATMENT MANAGEMENT: Following delivery, the patient was evaluated clinically. The patient tolerated treatment without significant acute effects, and was discharged to home in stable condition.   Fraction: 1  Dose:  50 Gy  PLAN: Continue treatment as planned.   ________________________________  Jodelle Gross, MD, PhD

## 2013-10-09 ENCOUNTER — Ambulatory Visit: Payer: Medicaid Other | Admitting: Radiation Oncology

## 2013-10-10 ENCOUNTER — Ambulatory Visit
Admission: RE | Admit: 2013-10-10 | Discharge: 2013-10-10 | Disposition: A | Discharge status: Home / Self Care | Payer: Medicaid Other | Source: Ambulatory Visit | Attending: Radiation Oncology | Admitting: Radiation Oncology

## 2013-10-10 ENCOUNTER — Encounter: Payer: Self-pay | Admitting: Radiation Oncology

## 2013-10-10 ENCOUNTER — Ambulatory Visit: Payer: Medicaid Other | Admitting: Radiation Oncology

## 2013-10-10 DIAGNOSIS — Z510 Encounter for antineoplastic radiation therapy: Secondary | ICD-10-CM | POA: Diagnosis not present

## 2013-10-10 NOTE — Progress Notes (Signed)
  Radiation Oncology         [phone] ________________________________  Name: [name] MRN: [id number]  Date: 10/10/2013  DOB: 1955/07/19  Stereotactic Body Radiotherapy Treatment Procedure Note  NARRATIVE:  [name] was brought to the stereotactic radiation treatment machine and placed supine on the CT couch. The patient was set up for stereotactic body radiotherapy on the body fix pillow.  3D TREATMENT PLANNING AND DOSIMETRY:  The patient's radiation plan was reviewed and approved prior to starting treatment.  It showed 3-dimensional radiation distributions overlaid onto the planning CT.  [facility] for the target structures as well as the organs at risk were reviewed. The documentation of this is filed in the radiation oncology EMR.  SIMULATION VERIFICATION:  The patient underwent CT imaging on the treatment unit.  These were carefully aligned to document that the ablative radiation dose would cover the right and left lung target volumes and maximally spare the nearby organs at risk according to the planned distribution.  SPECIAL TREATMENT PROCEDURE: [name] received high dose ablative stereotactic body radiotherapy to the planned target volume without unforeseen complications. Treatment was delivered uneventfully. She receive a further 1000 cGy for a cumulative dose of 2000 cGy in 2 sessions of a prescribed 5000 cGy in 5 sessions. The high doses associated with stereotactic body radiotherapy and the significant potential risks require careful treatment set up and patient monitoring constituting a special treatment procedure   STEREOTACTIC TREATMENT MANAGEMENT:  Following delivery, the patient was evaluated clinically. The patient tolerated treatment without significant acute effects, and was discharged to home in stable condition.    PLAN: Continue treatment as planned.  ------------------------------------------------        Rexene Edison, MD

## 2013-10-11 ENCOUNTER — Encounter: Payer: Self-pay | Admitting: Radiation Oncology

## 2013-10-11 ENCOUNTER — Ambulatory Visit: Payer: Medicaid Other | Admitting: Radiation Oncology

## 2013-10-12 ENCOUNTER — Ambulatory Visit
Admission: RE | Admit: 2013-10-12 | Discharge: 2013-10-12 | Disposition: A | Discharge status: Home / Self Care | Payer: Medicaid Other | Source: Ambulatory Visit | Attending: Radiation Oncology | Admitting: Radiation Oncology

## 2013-10-12 ENCOUNTER — Ambulatory Visit: Payer: Medicaid Other | Admitting: Radiation Oncology

## 2013-10-12 DIAGNOSIS — C189 Malignant neoplasm of colon, unspecified: Secondary | ICD-10-CM

## 2013-10-12 DIAGNOSIS — C787 Secondary malignant neoplasm of liver and intrahepatic bile duct: Principal | ICD-10-CM

## 2013-10-13 ENCOUNTER — Ambulatory Visit: Payer: Medicaid Other | Admitting: Radiation Oncology

## 2013-10-13 ENCOUNTER — Ambulatory Visit
Admission: RE | Admit: 2013-10-13 | Discharge: 2013-10-13 | Disposition: A | Discharge status: Home / Self Care | Payer: Medicaid Other | Source: Ambulatory Visit | Attending: Radiation Oncology | Admitting: Radiation Oncology

## 2013-10-13 DIAGNOSIS — Z510 Encounter for antineoplastic radiation therapy: Secondary | ICD-10-CM | POA: Diagnosis not present

## 2013-10-13 DIAGNOSIS — C801 Malignant (primary) neoplasm, unspecified: Secondary | ICD-10-CM

## 2013-10-16 ENCOUNTER — Ambulatory Visit
Admission: RE | Admit: 2013-10-16 | Discharge: 2013-10-16 | Disposition: A | Discharge status: Home / Self Care | Payer: Medicaid Other | Source: Ambulatory Visit | Attending: Radiation Oncology | Admitting: Radiation Oncology

## 2013-10-16 DIAGNOSIS — C801 Malignant (primary) neoplasm, unspecified: Secondary | ICD-10-CM

## 2013-10-16 DIAGNOSIS — Z510 Encounter for antineoplastic radiation therapy: Secondary | ICD-10-CM | POA: Diagnosis not present

## 2013-10-17 ENCOUNTER — Other Ambulatory Visit: Payer: Self-pay | Admitting: Oncology

## 2013-10-18 ENCOUNTER — Ambulatory Visit
Admission: RE | Admit: 2013-10-18 | Discharge: 2013-10-18 | Disposition: A | Discharge status: Home / Self Care | Payer: Medicaid Other | Source: Ambulatory Visit | Attending: Radiation Oncology | Admitting: Radiation Oncology

## 2013-10-18 ENCOUNTER — Encounter: Payer: Self-pay | Admitting: Radiation Oncology

## 2013-10-18 VITALS — BP 150/91 | HR 77 | Temp 97.8°F | Resp 20 | Wt 256.3 lb

## 2013-10-18 DIAGNOSIS — C801 Malignant (primary) neoplasm, unspecified: Secondary | ICD-10-CM

## 2013-10-18 DIAGNOSIS — Z510 Encounter for antineoplastic radiation therapy: Secondary | ICD-10-CM | POA: Diagnosis not present

## 2013-10-18 NOTE — Progress Notes (Signed)
Weekly SBRT txs 5/5 completed LL& RLL lung ,no c/o pain, difficulty swallowing, no fatigue,nausea, no coughing, appetite, good energy level good,. No c/o 1 month f/u appt ard given 3:05 PM

## 2013-10-18 NOTE — Progress Notes (Signed)
   Radiation Oncology         [phone] ________________________________  Name: Verble Styron MRN: [id number]  Date: 10/18/2013  DOB: [birth year]  Stereotactic Body Radiotherapy Treatment Procedure Note   NARRATIVE: Dnyla Antonetti was brought to the stereotactic radiation treatment machine and placed supine on the CT couch. The patient was set up for stereotactic body radiotherapy on the body fix pillow.   3D TREATMENT PLANNING AND DOSIMETRY: The patient's radiation plan was reviewed and approved prior to starting treatment. It showed 3-dimensional radiation distributions overlaid onto the planning CT. [facility] for the target structures as well as the organs at risk were reviewed. The documentation of this is filed in the radiation oncology EMR.   SIMULATION VERIFICATION: The patient underwent CT imaging on the treatment unit. These were carefully aligned to document that the ablative radiation dose would cover the target volume and maximally spare the nearby organs at risk according to the planned distribution.   SPECIAL TREATMENT PROCEDURE: [name] received high dose ablative stereotactic body radiotherapy to the planned target volume without unforeseen complications. Treatment was delivered uneventfully. The high doses associated with stereotactic body radiotherapy and the significant potential risks require careful treatment set up and patient monitoring constituting a special treatment procedure.   STEREOTACTIC TREATMENT MANAGEMENT: Following delivery, the patient was evaluated clinically. The patient tolerated treatment without significant acute effects, and was discharged to home in stable condition.   Fraction: 5  Dose:  50 Gy  PLAN: Continue treatment as planned.     ________________________________  Jodelle Gross, MD, PhD

## 2013-10-18 NOTE — Progress Notes (Signed)
   Department of Radiation Oncology  Phone:  [phone] Fax:        [phone]  Weekly Treatment Note    Name: [name] Date: 10/18/2013 MRN: [id number] DOB: [birth year]   Current dose: 50 Gy  Current fraction:5   MEDICATIONS: Current Outpatient Prescriptions  Medication Sig Dispense Refill  . amLODipine (NORVASC) 10 MG tablet Take 1 tablet (10 mg total) by mouth daily.  30 tablet  3  . ferrous sulfate 325 (65 FE) MG tablet Take 325 mg by mouth 2 (two) times daily.      Marland Kitchen lidocaine-prilocaine (EMLA) cream Apply topically as needed. Apply to [facility] site 1-2 hours prior to stick and cover with plastic wrap to numb site  30 g  3  . lisinopril (PRINIVIL,ZESTRIL) 20 MG tablet Take 1 tablet (20 mg total) by mouth daily.  90 tablet  0  . loperamide (IMODIUM) 2 MG capsule TAKE 2 CAPSULES AT FIRST LOOSE STOOL. THEN 1 EVERY 2 HOURS. TAKE 2 AT BEDTIME.STOP WHEN YOU HAVE NOT HAD BOWEL MOVEMENT IN 12 HOURS  30 capsule  0  . polyethylene glycol powder (MIRALAX) powder Take 17 g by mouth daily as needed.  255 g  0  . potassium chloride SA (K-DUR,KLOR-CON) 20 MEQ tablet Take 1 tablet (20 mEq total) by mouth daily.  31 tablet  1  . prochlorperazine (COMPAZINE) 10 MG tablet TAKE 1 TABLET BY MOUTH EVERY 6 HOURS AS NEEDED FOR NAUSEA OR VOMITING  30 tablet  1  . XARELTO 20 MG TABS tablet TAKE 1 TABLET BY MOUTH DAILY  30 tablet  0   No current facility-administered medications for this encounter.     ALLERGIES: Review of patient's allergies indicates no known allergies.   LABORATORY DATA:  Lab Results  Component Value Date   WBC 4.7 09/06/2013   HGB 12.2 09/06/2013   HCT 37.0 09/06/2013   MCV 91.3 09/06/2013   PLT 225 09/06/2013   Lab Results  Component Value Date   NA 139 09/06/2013   K 4.1 09/06/2013   CL 103 02/15/2013   CO2 21* 09/06/2013   Lab Results  Component Value Date   ALT 32 09/06/2013   AST 29 09/06/2013   ALKPHOS 79 09/06/2013   BILITOT 0.37 09/06/2013     NARRATIVE:  [name] was seen today for weekly treatment management. The chart was checked and the patient's films were reviewed. The patient finished her final fraction today. No complaints. No SOB, no chest discomfort.  PHYSICAL EXAMINATION: weight is 256 lb 4.8 oz (116.257 kg). Her oral temperature is 97.8 F (36.6 C). Her blood pressure is 150/91 and her pulse is 77. Her respiration is 20 and oxygen saturation is 100%.        ASSESSMENT: The patient did satisfactorily with treatment.  PLAN: Follow-up in one month.

## 2013-10-18 NOTE — Progress Notes (Signed)
   Radiation Oncology         [phone] ________________________________  Name: Sandar Krinke MRN: [id number]  Date: 10/16/2013  DOB: 1956/04/14  Stereotactic Body Radiotherapy Treatment Procedure Note   NARRATIVE: Alegandra Sommers was brought to the stereotactic radiation treatment machine and placed supine on the CT couch. The patient was set up for stereotactic body radiotherapy on the body fix pillow.   3D TREATMENT PLANNING AND DOSIMETRY: The patient's radiation plan was reviewed and approved prior to starting treatment. It showed 3-dimensional radiation distributions overlaid onto the planning CT. The [facility] for the target structures as well as the organs at risk were reviewed. The documentation of this is filed in the radiation oncology EMR.   SIMULATION VERIFICATION: The patient underwent CT imaging on the treatment unit. These were carefully aligned to document that the ablative radiation dose would cover the target volume and maximally spare the nearby organs at risk according to the planned distribution.   SPECIAL TREATMENT PROCEDURE: [name] received high dose ablative stereotactic body radiotherapy to the planned target volume without unforeseen complications. Treatment was delivered uneventfully. The high doses associated with stereotactic body radiotherapy and the significant potential risks require careful treatment set up and patient monitoring constituting a special treatment procedure.   STEREOTACTIC TREATMENT MANAGEMENT: Following delivery, the patient was evaluated clinically. The patient tolerated treatment without significant acute effects, and was discharged to home in stable condition.   Fraction: 4  Dose:  40 Gy  PLAN: Continue treatment as planned.    ________________________________  Jodelle Gross, MD, PhD

## 2013-11-03 ENCOUNTER — Ambulatory Visit (HOSPITAL_BASED_OUTPATIENT_CLINIC_OR_DEPARTMENT_OTHER): Payer: Medicaid Other | Admitting: Oncology

## 2013-11-03 ENCOUNTER — Telehealth: Payer: Self-pay | Admitting: Oncology

## 2013-11-03 ENCOUNTER — Encounter: Payer: Self-pay | Admitting: Radiation Oncology

## 2013-11-03 ENCOUNTER — Other Ambulatory Visit: Payer: Medicaid Other

## 2013-11-03 ENCOUNTER — Ambulatory Visit (HOSPITAL_BASED_OUTPATIENT_CLINIC_OR_DEPARTMENT_OTHER): Payer: Medicaid Other

## 2013-11-03 VITALS — BP 168/94 | HR 78 | Temp 98.1°F | Resp 18 | Ht 71.0 in | Wt 250.4 lb

## 2013-11-03 DIAGNOSIS — C189 Malignant neoplasm of colon, unspecified: Secondary | ICD-10-CM

## 2013-11-03 DIAGNOSIS — D509 Iron deficiency anemia, unspecified: Secondary | ICD-10-CM

## 2013-11-03 DIAGNOSIS — I10 Essential (primary) hypertension: Secondary | ICD-10-CM

## 2013-11-03 DIAGNOSIS — C787 Secondary malignant neoplasm of liver and intrahepatic bile duct: Principal | ICD-10-CM

## 2013-11-03 DIAGNOSIS — I82A19 Acute embolism and thrombosis of unspecified axillary vein: Secondary | ICD-10-CM

## 2013-11-03 DIAGNOSIS — C187 Malignant neoplasm of sigmoid colon: Secondary | ICD-10-CM

## 2013-11-03 DIAGNOSIS — I82B19 Acute embolism and thrombosis of unspecified subclavian vein: Secondary | ICD-10-CM

## 2013-11-03 DIAGNOSIS — Z95828 Presence of other vascular implants and grafts: Secondary | ICD-10-CM

## 2013-11-03 MED ORDER — HEPARIN SOD (PORK) LOCK FLUSH 100 UNIT/ML IV SOLN
500.0000 [IU] | Freq: Once | INTRAVENOUS | Status: AC
  Administered 2013-11-03: 500 [IU] via INTRAVENOUS
  Filled 2013-11-03: qty 5

## 2013-11-03 MED ORDER — SODIUM CHLORIDE 0.9 % IJ SOLN
10.0000 mL | INTRAMUSCULAR | Status: DC | PRN
  Administered 2013-11-03: 10 mL via INTRAVENOUS
  Filled 2013-11-03: qty 10

## 2013-11-03 NOTE — Progress Notes (Signed)
Hi Dr. Benay Spice,  She has room on her lisinopril to go up to 40mg  daily (i think she is just on 20mg  for now). I would consider that. She unfortunately has not been able to see me in the office due to transportation and issues, but I would be okay with that especially given that you monitor her electrolytes regularly and she can only arrange to come to your visits lately.   Thank you,  Olon Russ!

## 2013-11-03 NOTE — Progress Notes (Signed)
  [facility] OFFICE PROGRESS NOTE   Diagnosis: Colon cancer  INTERVAL HISTORY:   [name] returns as scheduled. She completed SRS to lung lesions on 10/19/2011. She reports tolerating the treatment well. No respiratory symptoms. 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 No other complaint.  Objective:  Vital signs in last 24 hours:  Blood pressure 168/94, pulse 78, temperature 98.1 F (36.7 C), temperature source Oral, resp. rate 18, height _0  (1.803 m), weight 250 lb 6.4 oz (113.581 kg).    HEENT: No thrush Resp: Lungs clear bilaterally Cardio: Regular rate and rhythm GI: No hepatomegaly Vascular: No leg edema  Skin: Mild hyperpigmentation over the hands   Portacath/PICC-without erythema  Lab Results:  Lab Results  Component Value Date   WBC 4.7 09/06/2013   HGB 12.2 09/06/2013   HCT 37.0 09/06/2013   MCV 91.3 09/06/2013   PLT 225 09/06/2013   NEUTROABS 1.9 09/06/2013    Lab Results  Component Value Date   CEA 21.3* 08/30/2013    Imaging:  No results found.  Medications: I have reviewed the patient's current medications.  Assessment/Plan: 1. Metastatic colon cancer (T4, N0, M1) adenocarcinoma of the sigmoid colon status post sigmoid colectomy and partial right hepatectomy 04/11/2012. K-ras wild-type on the initial codon 12 and 13 testing. K-ras Q61L mutation identified on extended testing. She completed cycle 1 CAPOX beginning 05/25/2012.  She completed cycle 8 CAPOX beginning 10/19/2012.  CEA on 10/19/2012 normal at 0.8.  CEA 6.1 on 01/11/2013  Restaging CT on 01/11/2013 consistent with multiple new pulmonary metastases  Cycle 1 of FOLFIRI/Avastin 01/18/2013; cycle 2 02/01/2013,cycle 3 02/16/2013.  CEA 5.0 on 03/01/2013.  Cycle 4 FOLFIRI/Avastin 03/01/2013.  Cycle 5 FOLFIRI/Avastin 03/15/2013.  Restaging chest CT 03/29/2013 with possible slight decrease in size of a left upper lobe metastasis. Other pulmonary nodules stable.  Cycle 6  FOLFIRI/Avastin 03/29/2013  Cycle 7 of FOLFIRI/Avastin 04/12/2013.  CEA 6.3 05/03/2013.  Cycle 8 FOLFIRI/Avastin 05/03/2013.  Cycle 9 FOLFIRI/Avastin 05/17/2013.  Cycle 10 FOLFIRI/Avastin 05/31/2013.  CEA 10.6 on 06/19/2013.  CT chest 06/19/2013 with mild progression of bilateral pulmonary nodules/metastases.  Decision made to continue FOLFIRI/Avastin. She completed cycle 11 06/21/2013.  Cycle 12 FOLFIRI/Avastin 07/05/2013  Cycle 13 FOLFIRI/Avastin 07/19/2013  Cycle 14 FOLFIRI/Avastin 08/01/2013.  CT chest 08/28/2013 with increased in size of bilateral lung nodules.  PET scan 09/22/2013 with multiple hypermetabolic pulmonary nodules bilaterally. Nodules demonstrate progressive enlargement compared with prior CTs. No evidence of extrathoracic metastatic disease. Status post SRS 2 lung nodules, completed 10/18/2013 2. Microcytic anemia, iron deficiency. Improved.  3. Status post Port-A-Cath placement 03/14/2012. X-ray showed the Port-A-Cath tip to be in the azygos vein. The left-sided Port-A-Cath was removed and a new right Port-A-Cath was placed on 05/23/2012. 4. 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 5. Hypertension-persistent on amlodipine. Lisinopril 5 mg daily added on 03/01/2013. Lisinopril increased to 10 mg daily on 03/15/2013. Increased to 20 mg 07/19/2013. 6. Delayed nausea following cycle 5 FOLFIRI/Avastin. Emend was added to the premedication regimen.   Disposition:  [name] appears stable. She tolerated the radiation well. We flushed the Port-A-Cath today. She will return for a Port-A-Cath flush and CEA in one month. We will schedule a restaging CT of the chest in the next 2-3 months.  Betsy Coder, MD  11/03/2013  12:40 PM

## 2013-11-03 NOTE — Telephone Encounter (Signed)
gv and printed appt sched and avs for tpf ro Aug

## 2013-11-04 LAB — CEA: CEA: 18.3 ng/mL — ABNORMAL HIGH (ref 0.0–5.0)

## 2013-11-16 ENCOUNTER — Encounter: Payer: Self-pay | Admitting: Radiation Oncology

## 2013-11-16 ENCOUNTER — Ambulatory Visit
Admission: RE | Admit: 2013-11-16 | Discharge: 2013-11-16 | Disposition: A | Discharge status: Home / Self Care | Payer: Medicaid Other | Source: Ambulatory Visit | Attending: Radiation Oncology | Admitting: Radiation Oncology

## 2013-11-16 VITALS — BP 184/99 | HR 73 | Temp 98.1°F | Resp 20 | Wt 256.5 lb

## 2013-11-16 DIAGNOSIS — C801 Malignant (primary) neoplasm, unspecified: Secondary | ICD-10-CM

## 2013-11-16 HISTORY — DX: Personal history of irradiation: Z92.3

## 2013-11-16 NOTE — Progress Notes (Signed)
Patient denies pain, cough, SOB, fatigue, loss of appetite. She states her high BP is due to "white coat syndrome".

## 2013-11-16 NOTE — Progress Notes (Signed)
  Radiation Oncology         [phone] ________________________________  Name: [name] MRN: [id number]  Date: 11/16/2013  DOB: Jun 13, 1955  Follow-Up Visit Note  CC: Jerene Pitch, MD  Jerene Pitch, MD  Diagnosis:   Metastatic colon cancer with long metastasis  Interval Since Last Radiation:  One month   Narrative:  The patient returns today for routine follow-up.  The patient states she is doing very well. She has no significant complaints today in terms of shortness of breath, cough, esophagitis. No chest pain. She is very pleased with how she has done. The patient has a repeat CT scan of the chest scheduled for a couple of months from now.                              ALLERGIES:  has No Known Allergies.  Meds: Current Outpatient Prescriptions  Medication Sig Dispense Refill  . amLODipine (NORVASC) 10 MG tablet Take 1 tablet (10 mg total) by mouth daily.  30 tablet  3  . ferrous sulfate 325 (65 FE) MG tablet Take 325 mg by mouth 2 (two) times daily.      Marland Kitchen lidocaine-prilocaine (EMLA) cream Apply topically as needed. Apply to [facility] site 1-2 hours prior to stick and cover with plastic wrap to numb site  30 g  3  . lisinopril (PRINIVIL,ZESTRIL) 20 MG tablet Take 1 tablet (20 mg total) by mouth daily.  90 tablet  0  . loperamide (IMODIUM) 2 MG capsule TAKE 2 CAPSULES AT FIRST LOOSE STOOL. THEN 1 EVERY 2 HOURS. TAKE 2 AT BEDTIME.STOP WHEN YOU HAVE NOT HAD BOWEL MOVEMENT IN 12 HOURS  30 capsule  0  . polyethylene glycol powder (MIRALAX) powder Take 17 g by mouth daily as needed.  255 g  0  . potassium chloride SA (K-DUR,KLOR-CON) 20 MEQ tablet Take 1 tablet (20 mEq total) by mouth daily.  31 tablet  1  . prochlorperazine (COMPAZINE) 10 MG tablet TAKE 1 TABLET BY MOUTH EVERY 6 HOURS AS NEEDED FOR NAUSEA OR VOMITING  30 tablet  1  . XARELTO 20 MG TABS tablet TAKE 1 TABLET BY MOUTH DAILY  30 tablet  0   No current facility-administered medications for this encounter.    Physical  Findings: The patient is in no acute distress. Patient is alert and oriented.  weight is 256 lb 8 oz (116.348 kg). Her temperature is 98.1 F (36.7 C). Her blood pressure is 184/99 and her pulse is 73. Her respiration is 20 and oxygen saturation is 100%. .     Lab Findings: Lab Results  Component Value Date   WBC 4.7 09/06/2013   HGB 12.2 09/06/2013   HCT 37.0 09/06/2013   MCV 91.3 09/06/2013   PLT 225 09/06/2013     Radiographic Findings: No results found.  Impression:    The patient clinically is doing well. No sign of toxicity from radiation treatment currently.  Plan:  I will have the patient return to clinic in 3 months. I discussed with her today that we can review her upcoming CT scan of the chest to determine if there is any additional role for radiation treatment at that time.   Jodelle Gross, M.D., Ph.D.

## 2013-11-28 NOTE — Progress Notes (Signed)
  Radiation Oncology         [phone] ________________________________  Name: [name] MRN: [id number]  Date: 10/18/2013  DOB: April 07, 1956  End of Treatment Note  Diagnosis:   Metastatic colorectal cancer     Indication for treatment:  Palliative       Radiation treatment dates:   10/06/2013 through 10/18/2013  Site/dose:   The patient was treated to thigh lung metastases, 3 on the left and 2 on the right. These were able to be treated using a 3 isocenter technique. The patient received a total of 50 gray in 5 fractions to each of these lesions corresponding to a course of stereotactic body radiation treatment.  Narrative: The patient tolerated radiation treatment relatively well.   The patient did not exhibit any significant acute toxicity during treatment.  Plan: The patient has completed radiation treatment. The patient will return to radiation oncology clinic for routine followup in one month. I advised the patient to call or return sooner if they have any questions or concerns related to their recovery or treatment. ________________________________  Jodelle Gross, M.D., Ph.D.

## 2013-11-28 NOTE — Progress Notes (Signed)
  Radiation Oncology         [phone] ________________________________  Name: [name] MRN: [id number]  Date: 09/22/2013  DOB: May 22, 1955  RESPIRATORY MOTION MANAGEMENT SIMULATION  NARRATIVE:  In order to account for effect of respiratory motion on target structures and other organs in the planning and delivery of radiotherapy, this patient underwent respiratory motion management simulation.  To accomplish this, when the patient was brought to the CT simulation planning suite, 4D respiratoy motion management CT images were obtained.  The CT images were loaded into the planning software.  Then, using a variety of tools including Cine, MIP, and standard views, the target volume and planning target volumes (PTV) were delineated.  Avoidance structures were contoured.  Treatment planning then occurred.  Dose volume histograms were generated and reviewed for each of the requested structure.  The resulting plan was carefully reviewed and approved today.   ------------------------------------------------  Jodelle Gross, MD, PhD

## 2013-11-28 NOTE — Addendum Note (Signed)
Encounter addended by: Marye Round, MD on: 11/28/2013 12:08 PM<BR>     Documentation filed: Notes Section

## 2013-11-28 NOTE — Progress Notes (Signed)
   Radiation Oncology         [phone] ________________________________  Name: [name] MRN: [id number]  Date: 10/13/2013  DOB: 07-16-1955  Stereotactic Body Radiotherapy Treatment Procedure Note   NARRATIVE: [name] was brought to the stereotactic radiation treatment machine and placed supine on the CT couch. The patient was set up for stereotactic body radiotherapy on the body fix pillow.   3D TREATMENT PLANNING AND DOSIMETRY: The patient's radiation plan was reviewed and approved prior to starting treatment. It showed 3-dimensional radiation distributions overlaid onto the planning CT. [facility] for the target structures as well as the organs at risk were reviewed. The documentation of this is filed in the radiation oncology EMR.   SIMULATION VERIFICATION: The patient underwent CT imaging on the treatment unit. These were carefully aligned to document that the ablative radiation dose would cover the target volume and maximally spare the nearby organs at risk according to the planned distribution.   SPECIAL TREATMENT PROCEDURE: [name] received high dose ablative stereotactic body radiotherapy to the planned target volume without unforeseen complications. Treatment was delivered uneventfully. The high doses associated with stereotactic body radiotherapy and the significant potential risks require careful treatment set up and patient monitoring constituting a special treatment procedure.   STEREOTACTIC TREATMENT MANAGEMENT: Following delivery, the patient was evaluated clinically. The patient tolerated treatment without significant acute effects, and was discharged to home in stable condition.   Fraction: 3  Dose:  30 Gy  PLAN: Continue treatment as planned.   ________________________________  Jodelle Gross, MD, PhD

## 2013-11-28 NOTE — Addendum Note (Signed)
Encounter addended by: Marye Round, MD on: 11/28/2013 12:03 PM<BR>     Documentation filed: Notes Section, Visit Diagnoses

## 2013-11-28 NOTE — Progress Notes (Signed)
[facility] Radiation Oncology Simulation and Treatment Planning Note   Name:  Tracie Dore MRN: [id number]   Date: 09/22/2013  DOB: 1956-01-02  Status:outpatient    DIAGNOSIS: The encounter diagnosis was Other malignant neoplasm without specification of site.  SITE:  Left and right lung. The patient will receive stereotactic body radiation treatment to 3 pulmonary metastases within the left lung and to pulmonary metastases within the right lung.   CONSENT VERIFIED:yes   SET UP: Patient is setup supine   IMMOBILIZATION: The patient was immobilized using a Vac Loc bag, and a customized active form device was also constructed to aid in patient immobilization. The patient set up also involved abdominal compression to attempt to reduce respiratory motion. A total of 2 complex treatment devices therefore will be used for immobilization during the course of radiation.    NARRATIVE:The patient was brought to the [facility].  Identity was confirmed.  All relevant records and images related to the planned course of therapy were reviewed.  Then, the patient was positioned in a stable reproducible clinical set-up for radiation therapy. Abdominal compression was applied by me.  4D CT images were obtained and reproducible breathing pattern was confirmed. Free breathing CT images were obtained.  Skin markings were placed.  The CT images were loaded into the planning software where the target and avoidance structures were contoured.  The radiation prescription was entered and confirmed.    TREATMENT PLANNING NOTE:  Treatment planning then occurred. I have requested : MLC's, 3D simulation/ isodose plan, basic dose calculation. It is anticipated that 6 customized fields will be used for the patient's treatment, with each of these corresponding to an additional complex treatment device.  3 dimensional simulation is performed and dose volume histogram of the gross tumor  volume, planning tumor volume and criticial normal structures including the spinal cord and lungs were analyzed and requested.  Special treatment procedure was performed due to high dose per fraction and the complexity of the planning process.  The patient will be monitored for increased risk of toxicity.  Daily imaging using cone beam CT/ MV CT will be used for target localization.   PLAN:  The patient will receive 50 Gy in 5 fractions.    ________________________________   Jodelle Gross, MD, PhD

## 2013-11-28 NOTE — Addendum Note (Signed)
Encounter addended by: Marye Round, MD on: 11/28/2013 12:06 PM<BR>     Documentation filed: Visit Diagnoses, Notes Section

## 2013-12-01 ENCOUNTER — Telehealth: Payer: Self-pay | Admitting: Oncology

## 2013-12-01 ENCOUNTER — Ambulatory Visit (HOSPITAL_BASED_OUTPATIENT_CLINIC_OR_DEPARTMENT_OTHER): Payer: Medicaid Other | Admitting: Nurse Practitioner

## 2013-12-01 ENCOUNTER — Other Ambulatory Visit: Payer: Self-pay | Admitting: Oncology

## 2013-12-01 ENCOUNTER — Ambulatory Visit (HOSPITAL_BASED_OUTPATIENT_CLINIC_OR_DEPARTMENT_OTHER): Payer: Medicaid Other

## 2013-12-01 ENCOUNTER — Other Ambulatory Visit: Payer: Medicaid Other

## 2013-12-01 VITALS — BP 160/88 | HR 68 | Temp 98.2°F | Resp 18 | Ht 71.0 in | Wt 253.6 lb

## 2013-12-01 DIAGNOSIS — Z95828 Presence of other vascular implants and grafts: Secondary | ICD-10-CM

## 2013-12-01 DIAGNOSIS — C787 Secondary malignant neoplasm of liver and intrahepatic bile duct: Secondary | ICD-10-CM

## 2013-12-01 DIAGNOSIS — C189 Malignant neoplasm of colon, unspecified: Secondary | ICD-10-CM

## 2013-12-01 DIAGNOSIS — C187 Malignant neoplasm of sigmoid colon: Secondary | ICD-10-CM

## 2013-12-01 DIAGNOSIS — I10 Essential (primary) hypertension: Secondary | ICD-10-CM

## 2013-12-01 LAB — CEA: CEA: 3.8 ng/mL (ref 0.0–5.0)

## 2013-12-01 MED ORDER — HEPARIN SOD (PORK) LOCK FLUSH 100 UNIT/ML IV SOLN
500.0000 [IU] | Freq: Once | INTRAVENOUS | Status: AC
  Administered 2013-12-01: 500 [IU] via INTRAVENOUS
  Filled 2013-12-01: qty 5

## 2013-12-01 MED ORDER — SODIUM CHLORIDE 0.9 % IJ SOLN
10.0000 mL | INTRAMUSCULAR | Status: DC | PRN
  Administered 2013-12-01: 10 mL via INTRAVENOUS
  Filled 2013-12-01: qty 10

## 2013-12-01 NOTE — Patient Instructions (Signed)

## 2013-12-01 NOTE — Telephone Encounter (Signed)
gv and printed appt scehd adna vs for pt for Sept

## 2013-12-01 NOTE — Progress Notes (Signed)
[place] OFFICE PROGRESS NOTE   Diagnosis:  Colon cancer.  INTERVAL HISTORY:   [name] returns as scheduled. She feels well. She has a good appetite. She denies pain. Specifically no abdominal pain. No nausea or vomiting. Bowels moving regularly. She denies any bleeding. No shortness of breath, cough or fever.  Objective:  Vital signs in last 24 hours:  Blood pressure 160/88, pulse 68, temperature 98.2 F (36.8 C), temperature source Oral, resp. rate 18, height $RemoveBe'5\' 11"'zCsddhSMG$  (1.803 m), weight 253 lb 9.6 oz (115.032 kg), SpO2 100.00%.    HEENT: No thrush or ulcerations. Lymphatics: No palpable cervical, supraclavicular or axillary lymph nodes. Resp: Lungs clear bilaterally. Cardio: Regular rate and rhythm. GI: Abdomen soft and nontender. No hepatomegaly. Vascular: No leg edema.   Port-A-Cath site without erythema.    Lab Results:  Lab Results  Component Value Date   WBC 4.7 09/06/2013   HGB 12.2 09/06/2013   HCT 37.0 09/06/2013   MCV 91.3 09/06/2013   PLT 225 09/06/2013   NEUTROABS 1.9 09/06/2013    Imaging:  No results found.  Medications: I have reviewed the patient's current medications.  Assessment/Plan: 1. Metastatic colon cancer (T4, N0, M1) adenocarcinoma of the sigmoid colon status post sigmoid colectomy and partial right hepatectomy 04/11/2012. No loss of mismatch repair proteins. K-ras wild-type on the initial codon 12 and 13 testing. K-ras Q61L mutation identified on extended testing. She completed cycle 1 CAPOX beginning 05/25/2012.  She completed cycle 8 CAPOX beginning 10/19/2012.  CEA on 10/19/2012 normal at 0.8.  CEA 6.1 on 01/11/2013  Restaging CT on 01/11/2013 consistent with multiple new pulmonary metastases  Cycle 1 of FOLFIRI/Avastin 01/18/2013; cycle 2 02/01/2013,cycle 3 02/16/2013.  CEA 5.0 on 03/01/2013.  Cycle 4 FOLFIRI/Avastin 03/01/2013.  Cycle 5 FOLFIRI/Avastin 03/15/2013.  Restaging chest CT 03/29/2013 with possible slight  decrease in size of a left upper lobe metastasis. Other pulmonary nodules stable.  Cycle 6 FOLFIRI/Avastin 03/29/2013  Cycle 7 of FOLFIRI/Avastin 04/12/2013.  CEA 6.3 05/03/2013.  Cycle 8 FOLFIRI/Avastin 05/03/2013.  Cycle 9 FOLFIRI/Avastin 05/17/2013.  Cycle 10 FOLFIRI/Avastin 05/31/2013.  CEA 10.6 on 06/19/2013.  CT chest 06/19/2013 with mild progression of bilateral pulmonary nodules/metastases.  Decision made to continue FOLFIRI/Avastin. She completed cycle 11 06/21/2013.  Cycle 12 FOLFIRI/Avastin 07/05/2013  Cycle 13 FOLFIRI/Avastin 07/19/2013  Cycle 14 FOLFIRI/Avastin 08/01/2013.  CT chest 08/28/2013 with increased in size of bilateral lung nodules.  PET scan 09/22/2013 with multiple hypermetabolic pulmonary nodules bilaterally. Nodules demonstrate progressive enlargement compared with prior CTs. No evidence of extrathoracic metastatic disease.  Status post SRS 2 lung nodules, completed 10/18/2013 2. Microcytic anemia, iron deficiency. Improved.  3. Status post Port-A-Cath placement 03/14/2012. X-ray showed the Port-A-Cath tip to be in the azygos vein. The left-sided Port-A-Cath was removed and a new right Port-A-Cath was placed on 05/23/2012. 4. 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 5. Hypertension-persistent on amlodipine. Lisinopril 5 mg daily added on 03/01/2013. Lisinopril increased to 10 mg daily on 03/15/2013. Increased to 20 mg 07/19/2013. 6. Delayed nausea following cycle 5 FOLFIRI/Avastin. Emend was added to the premedication regimen   Disposition: [name] appears stable. We will followup on the CEA from today. Dr. Benay Spice recommends a restaging CT of the chest in 6 weeks. Due to transportation issues we will try to schedule labs, CT and an office visit all on 12/12/2013. She will contact the office prior to her next visit with any problems.  Plan reviewed  with Dr. Benay Spice.    Ned Card  ANP/GNP-BC   12/01/2013  10:50 AM

## 2013-12-01 NOTE — Progress Notes (Signed)
Per order from Dr. Benay Spice, spoke with Elsie Lincoln in pathology department to request that MSI testing by PCR be performed on Accession #: [id number].

## 2013-12-04 ENCOUNTER — Other Ambulatory Visit (HOSPITAL_COMMUNITY)
Admission: RE | Admit: 2013-12-04 | Discharge: 2013-12-04 | Disposition: A | Discharge status: Home / Self Care | Payer: Medicaid Other | Source: Ambulatory Visit | Attending: Oncology | Admitting: Oncology

## 2013-12-04 ENCOUNTER — Telehealth: Payer: Self-pay

## 2013-12-04 DIAGNOSIS — C787 Secondary malignant neoplasm of liver and intrahepatic bile duct: Secondary | ICD-10-CM | POA: Diagnosis not present

## 2013-12-04 DIAGNOSIS — C189 Malignant neoplasm of colon, unspecified: Secondary | ICD-10-CM | POA: Insufficient documentation

## 2013-12-04 NOTE — Telephone Encounter (Signed)
Called and informed patient CEA was better, patient verbalized understanding and denies any questions or concerns at this time.

## 2013-12-04 NOTE — Telephone Encounter (Signed)
Message copied by Tyler Aas A on Mon Dec 04, 2013  9:35 AM ------      Message from: Wardell Heath      Created: Mon Dec 04, 2013  8:22 AM                   ----- Message -----         From: Ladell Pier, MD         Sent: 12/01/2013   6:57 PM           To: Tania Ade, RN, Ludwig Lean, RN, #            Please call patient, cea is better ------

## 2013-12-11 ENCOUNTER — Encounter (HOSPITAL_COMMUNITY): Payer: Self-pay

## 2013-12-17 ENCOUNTER — Other Ambulatory Visit: Payer: Self-pay | Admitting: Oncology

## 2014-01-12 ENCOUNTER — Telehealth: Payer: Self-pay | Admitting: Oncology

## 2014-01-12 ENCOUNTER — Other Ambulatory Visit (HOSPITAL_BASED_OUTPATIENT_CLINIC_OR_DEPARTMENT_OTHER): Payer: Medicaid Other

## 2014-01-12 ENCOUNTER — Ambulatory Visit: Payer: Medicaid Other | Admitting: Oncology

## 2014-01-12 ENCOUNTER — Ambulatory Visit (HOSPITAL_COMMUNITY)
Admission: RE | Admit: 2014-01-12 | Discharge: 2014-01-12 | Disposition: A | Discharge status: Home / Self Care | Payer: Medicaid Other | Source: Ambulatory Visit | Attending: Nurse Practitioner | Admitting: Nurse Practitioner

## 2014-01-12 ENCOUNTER — Encounter (HOSPITAL_COMMUNITY): Payer: Self-pay

## 2014-01-12 ENCOUNTER — Ambulatory Visit (HOSPITAL_BASED_OUTPATIENT_CLINIC_OR_DEPARTMENT_OTHER): Payer: Medicaid Other | Admitting: Oncology

## 2014-01-12 VITALS — BP 155/81 | HR 75 | Temp 98.3°F | Resp 19 | Ht 71.0 in | Wt 249.2 lb

## 2014-01-12 DIAGNOSIS — C787 Secondary malignant neoplasm of liver and intrahepatic bile duct: Secondary | ICD-10-CM | POA: Diagnosis not present

## 2014-01-12 DIAGNOSIS — C189 Malignant neoplasm of colon, unspecified: Secondary | ICD-10-CM

## 2014-01-12 DIAGNOSIS — C7800 Secondary malignant neoplasm of unspecified lung: Secondary | ICD-10-CM

## 2014-01-12 DIAGNOSIS — I82B19 Acute embolism and thrombosis of unspecified subclavian vein: Secondary | ICD-10-CM

## 2014-01-12 DIAGNOSIS — R918 Other nonspecific abnormal finding of lung field: Secondary | ICD-10-CM | POA: Insufficient documentation

## 2014-01-12 DIAGNOSIS — I82A19 Acute embolism and thrombosis of unspecified axillary vein: Secondary | ICD-10-CM

## 2014-01-12 DIAGNOSIS — C187 Malignant neoplasm of sigmoid colon: Secondary | ICD-10-CM

## 2014-01-12 DIAGNOSIS — J8409 Other alveolar and parieto-alveolar conditions: Secondary | ICD-10-CM | POA: Diagnosis not present

## 2014-01-12 DIAGNOSIS — I10 Essential (primary) hypertension: Secondary | ICD-10-CM

## 2014-01-12 DIAGNOSIS — D509 Iron deficiency anemia, unspecified: Secondary | ICD-10-CM

## 2014-01-12 LAB — COMPREHENSIVE METABOLIC PANEL (CC13)
ALBUMIN: 3.8 g/dL (ref 3.5–5.0)
ALT: 13 U/L (ref 0–55)
AST: 19 U/L (ref 5–34)
Alkaline Phosphatase: 86 U/L (ref 40–150)
Anion Gap: 10 mEq/L (ref 3–11)
BUN: 8 mg/dL (ref 7.0–26.0)
CALCIUM: 10.1 mg/dL (ref 8.4–10.4)
CHLORIDE: 106 meq/L (ref 98–109)
CO2: 26 mEq/L (ref 22–29)
Creatinine: 0.9 mg/dL (ref 0.6–1.1)
Glucose: 99 mg/dl (ref 70–140)
POTASSIUM: 3.6 meq/L (ref 3.5–5.1)
Sodium: 142 mEq/L (ref 136–145)
Total Bilirubin: 0.68 mg/dL (ref 0.20–1.20)
Total Protein: 8.1 g/dL (ref 6.4–8.3)

## 2014-01-12 LAB — CEA: CEA: 0.9 ng/mL (ref 0.0–5.0)

## 2014-01-12 MED ORDER — IOHEXOL 300 MG/ML  SOLN
80.0000 mL | Freq: Once | INTRAMUSCULAR | Status: AC | PRN
  Administered 2014-01-12: 80 mL via INTRAVENOUS

## 2014-01-12 NOTE — Progress Notes (Signed)
[facility] OFFICE PROGRESS NOTE   Diagnosis: Colon cancer  INTERVAL HISTORY:   She returns as scheduled. She feels well. Good appetite. No dyspnea, fever, or cough. She continues to have numbness in the feet.  Objective:  Vital signs in last 24 hours:  Blood pressure 155/81, pulse 75, temperature 98.3 F (36.8 C), temperature source Oral, resp. rate 19, height $RemoveBe'5\' 11"'yVdtklrKI$  (1.803 m), weight 249 lb 3.2 oz (113.036 kg).    Resp: Lungs clear bilaterally, no respiratory distress Cardio: Regular rate and rhythm GI: No hepatosplenomegaly, nontender, no mass Vascular: No leg edema  Skin: Mild hyperpigmentation of the hands   Portacath/PICC-without erythema  Lab Results:  Lab Results  Component Value Date   WBC 4.7 09/06/2013   HGB 12.2 09/06/2013   HCT 37.0 09/06/2013   MCV 91.3 09/06/2013   PLT 225 09/06/2013   NEUTROABS 1.9 09/06/2013      Lab Results  Component Value Date   CEA 3.8 12/01/2013    Imaging:  Ct Chest W Contrast  01/12/2014   CLINICAL DATA:  Metastatic colon cancer.  Follow-up lung nodules.  EXAM: CT CHEST WITH CONTRAST  TECHNIQUE: Multidetector CT imaging of the chest was performed during intravenous contrast administration.  CONTRAST:  80mL OMNIPAQUE IOHEXOL 300 MG/ML  SOLN  COMPARISON:  PET CT 09/22/2013.  Chest CT 08/28/2013.  FINDINGS: New airspace opacities are noted in the upper lobes bilaterally, obscuring the previously seen upper lobe pulmonary nodules. Airspace opacity also in the superior segment of the left lower lobe, obscuring the pulmonary nodule. Partial obscuration of the right lower lobe pulmonary nodule by surrounding airspace disease. The visualized right lower lobe pulmonary nodule now measures up to 1.6 cm compared to 1.8 cm previously. This is measured on image 35. Airspace opacities presumably represent postradiation pneumonitis. No new pulmonary nodules visualized. No effusions.  Heart is normal size. Aorta is normal caliber. Right  Port-A-Cath remains in place, unchanged. No mediastinal, hilar or axillary adenopathy.  Chest wall soft tissues are unremarkable.  Imaging into the upper abdomen shows no acute findings. Postsurgical changes noted in the liver, unchanged.  No acute bony abnormality or focal bone lesion.  IMPRESSION: Extensive airspace disease surrounding the previously seen pulmonary nodules bilaterally, presumably postradiation changes/pneumonitis. The nodules are obscured with only the right lower lobe pulmonary nodule visualized and measurable, slightly smaller than previous study.  No new pulmonary nodules or adenopathy in the chest.   Electronically Signed   By: Rolm Baptise M.D.   On: 01/12/2014 12:22    Medications: I have reviewed the patient's current medications.  Assessment/Plan: 1. Metastatic colon cancer (T4, N0, M1) adenocarcinoma of the sigmoid colon status post sigmoid colectomy and partial right hepatectomy 04/11/2012. No loss of mismatch repair proteins. K-ras wild-type on the initial codon 12 and 13 testing. K-ras Q61L mutation identified on extended testing. She completed cycle 1 CAPOX beginning 05/25/2012.  She completed cycle 8 CAPOX beginning 10/19/2012.  CEA on 10/19/2012 normal at 0.8.  CEA 6.1 on 01/11/2013  Restaging CT on 01/11/2013 consistent with multiple new pulmonary metastases  Cycle 1 of FOLFIRI/Avastin 01/18/2013; cycle 2 02/01/2013,cycle 3 02/16/2013.  CEA 5.0 on 03/01/2013.  Cycle 4 FOLFIRI/Avastin 03/01/2013.  Cycle 5 FOLFIRI/Avastin 03/15/2013.  Restaging chest CT 03/29/2013 with possible slight decrease in size of a left upper lobe metastasis. Other pulmonary nodules stable.  Cycle 6 FOLFIRI/Avastin 03/29/2013  Cycle 7 of FOLFIRI/Avastin 04/12/2013.  CEA 6.3 05/03/2013.  Cycle 8 FOLFIRI/Avastin 05/03/2013.  Cycle 9 FOLFIRI/Avastin  05/17/2013.  Cycle 10 FOLFIRI/Avastin 05/31/2013.  CEA 10.6 on 06/19/2013.  CT chest 06/19/2013 with mild progression of bilateral pulmonary  nodules/metastases.  Decision made to continue FOLFIRI/Avastin. She completed cycle 11 06/21/2013.  Cycle 12 FOLFIRI/Avastin 07/05/2013  Cycle 13 FOLFIRI/Avastin 07/19/2013  Cycle 14 FOLFIRI/Avastin 08/01/2013.  CT chest 08/28/2013 with increased in size of bilateral lung nodules.  PET scan 09/22/2013 with multiple hypermetabolic pulmonary nodules bilaterally. Nodules demonstrate progressive enlargement compared with prior CTs. No evidence of extrathoracic metastatic disease.  Status post SRS 2 lung nodules, completed 10/18/2013 CT 01/12/2014 with airspace disease surrounding treated pulmonary nodules, no new nodules 2. Microcytic anemia, iron deficiency. Improved.  3. Status post Port-A-Cath placement 03/14/2012. X-ray showed the Port-A-Cath tip to be in the azygos vein. The left-sided Port-A-Cath was removed and a new right Port-A-Cath was placed on 05/23/2012. 4. 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 5. Hypertension-persistent on amlodipine. Lisinopril 5 mg daily added on 03/01/2013. Lisinopril increased to 10 mg daily on 03/15/2013. Increased to 20 mg 07/19/2013. 6. Delayed nausea following cycle 5 FOLFIRI/Avastin. Emend was added to the premedication regimen   Disposition:  [name] appears well. The CT reveals inflammation surrounding the treated pulmonary nodules and no evidence of disease progression. She does not have symptoms of pneumonitis. She knows to contact us for a fever, dyspnea, or cough. The CEA was improved last month. We will followup on the CEA from today. [name] will return for an office visit and CEA in 6 weeks.  Betsy Coder, MD  01/12/2014  1:02 PM

## 2014-01-12 NOTE — Telephone Encounter (Signed)
gv adn printed appt sched and avs for pt for OCT...pt needed friday appt

## 2014-01-15 ENCOUNTER — Ambulatory Visit: Payer: Medicaid Other | Admitting: Oncology

## 2014-01-16 ENCOUNTER — Telehealth: Payer: Self-pay | Admitting: *Deleted

## 2014-01-16 ENCOUNTER — Other Ambulatory Visit: Payer: Self-pay | Admitting: Oncology

## 2014-01-16 DIAGNOSIS — C787 Secondary malignant neoplasm of liver and intrahepatic bile duct: Secondary | ICD-10-CM

## 2014-01-16 DIAGNOSIS — C189 Malignant neoplasm of colon, unspecified: Secondary | ICD-10-CM

## 2014-01-16 NOTE — Telephone Encounter (Signed)
Message copied by Brien Few on Tue Jan 16, 2014  5:29 PM ------      Message from: Betsy Coder B      Created: Mon Jan 15, 2014  7:29 AM       Please call patient, cea is better, f/u as scheduled ------

## 2014-01-16 NOTE — Telephone Encounter (Signed)
Left message on voicemail instructing pt to call office for lab results.

## 2014-01-19 ENCOUNTER — Telehealth: Payer: Self-pay | Admitting: *Deleted

## 2014-01-19 NOTE — Telephone Encounter (Signed)
Asking if OK to try to discontinue the po ferrous sulfate and change to regular MVI with iron? Last chemo April 2015 and her May lab showed normal Hgb and MCV.

## 2014-01-22 ENCOUNTER — Telehealth: Payer: Self-pay | Admitting: *Deleted

## 2014-01-22 NOTE — Telephone Encounter (Signed)
Returned call to pt in response to her 9/25 question. Per Dr. Benay Spice: OK to discontinue iron. Pt reports she will begin a regular multivitamin.

## 2014-01-26 ENCOUNTER — Telehealth: Payer: Self-pay | Admitting: Internal Medicine

## 2014-01-26 NOTE — Telephone Encounter (Signed)
Spoke with patient this morning about scheduling an appointment, but she informed me that the cancer center handles all of her medical needs.  Stated she would call back to schedule an appt later on after finishing with the cancer center.  Informed her she would have to start over as a new patient since we have not seen her since 2013.  Removed Dr. Eula Fried as pcp.

## 2014-01-31 ENCOUNTER — Other Ambulatory Visit: Payer: Self-pay | Admitting: Nurse Practitioner

## 2014-02-14 ENCOUNTER — Other Ambulatory Visit: Payer: Self-pay | Admitting: Nurse Practitioner

## 2014-02-14 DIAGNOSIS — C189 Malignant neoplasm of colon, unspecified: Secondary | ICD-10-CM

## 2014-02-14 DIAGNOSIS — C787 Secondary malignant neoplasm of liver and intrahepatic bile duct: Secondary | ICD-10-CM

## 2014-02-15 ENCOUNTER — Ambulatory Visit
Admission: RE | Admit: 2014-02-15 | Discharge: 2014-02-15 | Disposition: A | Discharge status: Home / Self Care | Payer: Medicaid Other | Source: Ambulatory Visit | Attending: Radiation Oncology | Admitting: Radiation Oncology

## 2014-02-15 VITALS — BP 155/87 | HR 82 | Temp 98.8°F | Resp 14 | Wt 253.0 lb

## 2014-02-15 DIAGNOSIS — C7801 Secondary malignant neoplasm of right lung: Secondary | ICD-10-CM

## 2014-02-15 DIAGNOSIS — C7802 Secondary malignant neoplasm of left lung: Principal | ICD-10-CM

## 2014-02-15 NOTE — Progress Notes (Signed)
Pt here for follow up appointment.  Reports having bowel movements everyday that are a formed consistency, with occasional diarrhea.  Denies vomiting, abdominal pain and rectal bleeding.  Reports a dry cough with a runny nose for the last 2 weeks.  Oral exam reveals a red tongue with a small amount of yellow exudate noted.

## 2014-02-16 ENCOUNTER — Encounter: Payer: Self-pay | Admitting: *Deleted

## 2014-02-16 ENCOUNTER — Encounter: Payer: Self-pay | Admitting: Radiation Oncology

## 2014-02-16 NOTE — Progress Notes (Signed)
  Radiation Oncology         [phone] ________________________________  Name: [name] MRN: [id number]  Date: 02/15/2014  DOB: 04/28/1955  Follow-Up Visit Note  CC: No primary provider on file.  Wilber Oliphant, MD  Diagnosis:    Metastatic colon cancer with pulmonary metastases  Interval Since Last Radiation:  The patient completed radiation treatment to the long on 10/18/2013   Narrative:  The patient returns today for routine follow-up.  The patient states that she is doing well overall at this time. The patient denies any worsening shortness of breath. She denies any esophagitis or chest pain. She also denies any additional distant pain. The patient's last CT imaging did not show any evidence of worsening disease within the chest. She is pleased with how she is doing.                              ALLERGIES:  has No Known Allergies.  Meds: Current Outpatient Prescriptions  Medication Sig Dispense Refill  . amLODipine (NORVASC) 10 MG tablet TAKE 1 TABLET BY MOUTH EVERY DAY  30 tablet  0  . lidocaine-prilocaine (EMLA) cream Apply topically as needed. Apply to [facility] site 1-2 hours prior to stick and cover with plastic wrap to numb site  30 g  3  . lisinopril (PRINIVIL,ZESTRIL) 20 MG tablet Take 1 tablet (20 mg total) by mouth daily.  90 tablet  0  . loperamide (IMODIUM) 2 MG capsule TAKE 2 CAPSULES AT FIRST LOOSE STOOL. THEN 1 EVERY 2 HOURS. TAKE 2 AT BEDTIME.STOP WHEN YOU HAVE NOT HAD BOWEL MOVEMENT IN 12 HOURS  30 capsule  0  . Multiple Vitamins-Minerals (MULTIVITAMIN) tablet Take 1 tablet by mouth daily.      . polyethylene glycol powder (MIRALAX) powder Take 17 g by mouth daily as needed.  255 g  0  . potassium chloride SA (K-DUR,KLOR-CON) 20 MEQ tablet TAKE 1 TABLET BY MOUTH EVERY DAY  90 tablet  0  . prochlorperazine (COMPAZINE) 10 MG tablet TAKE 1 TABLET BY MOUTH EVERY 6 HOURS AS NEEDED FOR NAUSEA OR VOMITING  30 tablet  1  . XARELTO 20 MG TABS tablet TAKE 1 TABLET BY  MOUTH EVERY DAY  30 tablet  2   No current facility-administered medications for this encounter.    Physical Findings: The patient is in no acute distress. Patient is alert and oriented.  weight is 253 lb (114.76 kg). Her oral temperature is 98.8 F (37.1 C). Her blood pressure is 155/87 and her pulse is 82. Her respiration is 14 and oxygen saturation is 100%. .   General: Well-developed, in no acute distress HEENT: Normocephalic, atraumatic Cardiovascular: Regular rate and rhythm Respiratory: Clear to auscultation bilaterally GI: Soft, nontender, normal bowel sounds Extremities: No edema present   Lab Findings: Lab Results  Component Value Date   WBC 4.7 09/06/2013   HGB 12.2 09/06/2013   HCT 37.0 09/06/2013   MCV 91.3 09/06/2013   PLT 225 09/06/2013     Radiographic Findings: No results found.  Impression:    The patient clinically is doing well at this time.  Plan:  Followup in 6 months.  I spent 10 minutes with the patient today, the majority of which was spent counseling the patient on the diagnosis of cancer and coordinating care.   Jodelle Gross, M.D., Ph.D.

## 2014-02-16 NOTE — Progress Notes (Signed)
[place] Work   Holiday representative met with patient at [facility] to offer support and assess for needs.  Pt reports she is doing well currently and that things are going pretty well at home. Pt has had financial concerns in the past and CSW wanted to reassess for needs. Pt states that her family continues to be a source of positive support and assistance. Pt denied current concerns, but was very appreciative of the check in.   [name], [place] Worker [facility]  [facility] Phone: [phone] Fax: [phone]

## 2014-03-01 ENCOUNTER — Telehealth: Payer: Self-pay | Admitting: Nurse Practitioner

## 2014-03-01 NOTE — Telephone Encounter (Signed)
Lft msg for pt to call to confirm labs/flush/ov per provider request r/s 11/06, mailed updated sch to pt... KJ

## 2014-03-02 ENCOUNTER — Ambulatory Visit: Payer: Medicaid Other | Admitting: Nurse Practitioner

## 2014-03-02 ENCOUNTER — Other Ambulatory Visit: Payer: Medicaid Other

## 2014-03-07 ENCOUNTER — Telehealth: Payer: Self-pay | Admitting: Oncology

## 2014-03-07 ENCOUNTER — Ambulatory Visit (HOSPITAL_BASED_OUTPATIENT_CLINIC_OR_DEPARTMENT_OTHER): Payer: Medicaid Other | Admitting: Nurse Practitioner

## 2014-03-07 ENCOUNTER — Other Ambulatory Visit: Payer: Medicaid Other

## 2014-03-07 ENCOUNTER — Ambulatory Visit (HOSPITAL_BASED_OUTPATIENT_CLINIC_OR_DEPARTMENT_OTHER): Payer: Medicaid Other

## 2014-03-07 VITALS — BP 147/72 | HR 76 | Temp 97.9°F | Resp 20

## 2014-03-07 VITALS — Ht 71.0 in | Wt 254.1 lb

## 2014-03-07 DIAGNOSIS — C7801 Secondary malignant neoplasm of right lung: Secondary | ICD-10-CM

## 2014-03-07 DIAGNOSIS — C787 Secondary malignant neoplasm of liver and intrahepatic bile duct: Principal | ICD-10-CM

## 2014-03-07 DIAGNOSIS — C189 Malignant neoplasm of colon, unspecified: Secondary | ICD-10-CM

## 2014-03-07 DIAGNOSIS — C7802 Secondary malignant neoplasm of left lung: Secondary | ICD-10-CM

## 2014-03-07 DIAGNOSIS — C187 Malignant neoplasm of sigmoid colon: Secondary | ICD-10-CM

## 2014-03-07 DIAGNOSIS — C7800 Secondary malignant neoplasm of unspecified lung: Secondary | ICD-10-CM

## 2014-03-07 DIAGNOSIS — I10 Essential (primary) hypertension: Secondary | ICD-10-CM

## 2014-03-07 DIAGNOSIS — Z95828 Presence of other vascular implants and grafts: Secondary | ICD-10-CM

## 2014-03-07 MED ORDER — HEPARIN SOD (PORK) LOCK FLUSH 100 UNIT/ML IV SOLN
500.0000 [IU] | Freq: Once | INTRAVENOUS | Status: AC
  Administered 2014-03-07: 500 [IU] via INTRAVENOUS
  Filled 2014-03-07: qty 5

## 2014-03-07 MED ORDER — SODIUM CHLORIDE 0.9 % IJ SOLN
10.0000 mL | INTRAMUSCULAR | Status: DC | PRN
  Administered 2014-03-07: 10 mL via INTRAVENOUS
  Filled 2014-03-07: qty 10

## 2014-03-07 NOTE — Progress Notes (Signed)
[facility] OFFICE PROGRESS NOTE   Diagnosis:  Colon cancer  INTERVAL HISTORY:   [name] returns as scheduled. She feels well. She denies fever, cough and shortness of breath. No pain. No nausea or vomiting. No constipation or diarrhea. She had a recent cold. Symptoms are improving. She has persistent numbness in the feet.  Objective:  Vital signs in last 24 hours:  Height 5\' 11"  (1.803 m), weight 254 lb 1.6 oz (115.259 kg).    HEENT: no thrush or ulcers. Resp: lungs clear bilaterally. Cardio: regular rate and rhythm. GI: abdomen soft and nontender. No mass. No organomegaly. Vascular: no leg edema.  Port-A-Cath without erythema.    Lab Results:  Lab Results  Component Value Date   WBC 4.7 09/06/2013   HGB 12.2 09/06/2013   HCT 37.0 09/06/2013   MCV 91.3 09/06/2013   PLT 225 09/06/2013   NEUTROABS 1.9 09/06/2013    Imaging:  No results found.  Medications: I have reviewed the patient's current medications.  Assessment/Plan: 1. Metastatic colon cancer (T4, N0, M1) adenocarcinoma of the sigmoid colon status post sigmoid colectomy and partial right hepatectomy 04/11/2012. No loss of mismatch repair proteins. K-ras wild-type on the initial codon 12 and 13 testing. K-ras Q61L mutation identified on extended testing.  She completed cycle 1 CAPOX beginning 05/25/2012.   She completed cycle 8 CAPOX beginning 10/19/2012.   CEA on 10/19/2012 normal at 0.8.   CEA 6.1 on 01/11/2013   Restaging CT on 01/11/2013 consistent with multiple new pulmonary metastases   Cycle 1 of FOLFIRI/Avastin 01/18/2013; cycle 2 02/01/2013,cycle 3 02/16/2013.   CEA 5.0 on 03/01/2013.   Cycle 4 FOLFIRI/Avastin 03/01/2013.   Cycle 5 FOLFIRI/Avastin 03/15/2013.   Restaging chest CT 03/29/2013 with possible slight decrease in size of a left upper lobe metastasis. Other pulmonary nodules stable.   Cycle 6 FOLFIRI/Avastin 03/29/2013   Cycle 7 of FOLFIRI/Avastin  04/12/2013.   CEA 6.3 05/03/2013.   Cycle 8 FOLFIRI/Avastin 05/03/2013.   Cycle 9 FOLFIRI/Avastin 05/17/2013.   Cycle 10 FOLFIRI/Avastin 05/31/2013.   CEA 10.6 on 06/19/2013.   CT chest 06/19/2013 with mild progression of bilateral pulmonary nodules/metastases.   Decision made to continue FOLFIRI/Avastin. She completed cycle 11 06/21/2013.   Cycle 12 FOLFIRI/Avastin 07/05/2013   Cycle 13 FOLFIRI/Avastin 07/19/2013   Cycle 14 FOLFIRI/Avastin 08/01/2013.   CT chest 08/28/2013 with increased in size of bilateral lung nodules.   PET scan 09/22/2013 with multiple hypermetabolic pulmonary nodules bilaterally. Nodules demonstrate progressive enlargement compared with prior CTs. No evidence of extrathoracic metastatic disease.   Status post SRS 2 lung nodules, completed 10/18/2013  CT 01/12/2014 with airspace disease surrounding treated pulmonary nodules, no new nodules  01/12/2014 CEA 0.9 2. Microcytic anemia, iron deficiency. Improved.  3. Status post Port-A-Cath placement 03/14/2012. X-ray showed the Port-A-Cath tip to be in the azygos vein. The left-sided Port-A-Cath was removed and a new right Port-A-Cath was placed on 05/23/2012. 4. 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 5. Hypertension-persistent on amlodipine. Lisinopril 5 mg daily added on 03/01/2013. Lisinopril increased to 10 mg daily on 03/15/2013. Increased to 20 mg 07/19/2013. 6. Delayed nausea following cycle 5 FOLFIRI/Avastin. Emend was added to the premedication regimen   Disposition:[name] appears well. We will followup on the CEA from today. She will return for a followup visit, CEA and noncontrast CT scan of the chest in approximately 6 weeks. She will contact the office in the interim with any  problems.  Plan reviewed with Dr. Benay Spice.    Ned Card ANP/GNP-BC   03/07/2014  2:17 PM

## 2014-03-07 NOTE — Telephone Encounter (Signed)
gv and printed appt sched and avs for pt for DEC °

## 2014-03-07 NOTE — Patient Instructions (Signed)

## 2014-03-08 ENCOUNTER — Other Ambulatory Visit: Payer: Self-pay | Admitting: *Deleted

## 2014-03-08 LAB — CEA: CEA: 2.6 ng/mL (ref 0.0–5.0)

## 2014-03-17 ENCOUNTER — Other Ambulatory Visit: Payer: Self-pay | Admitting: Nurse Practitioner

## 2014-04-12 ENCOUNTER — Telehealth: Payer: Self-pay | Admitting: Oncology

## 2014-04-12 ENCOUNTER — Ambulatory Visit (HOSPITAL_BASED_OUTPATIENT_CLINIC_OR_DEPARTMENT_OTHER): Payer: Medicaid Other | Admitting: Oncology

## 2014-04-12 ENCOUNTER — Ambulatory Visit (HOSPITAL_BASED_OUTPATIENT_CLINIC_OR_DEPARTMENT_OTHER): Payer: Medicaid Other

## 2014-04-12 ENCOUNTER — Ambulatory Visit (HOSPITAL_COMMUNITY)
Admission: RE | Admit: 2014-04-12 | Discharge: 2014-04-12 | Disposition: A | Discharge status: Home / Self Care | Payer: Medicaid Other | Source: Ambulatory Visit | Attending: Nurse Practitioner | Admitting: Nurse Practitioner

## 2014-04-12 ENCOUNTER — Encounter (HOSPITAL_COMMUNITY): Payer: Self-pay

## 2014-04-12 ENCOUNTER — Other Ambulatory Visit: Payer: Medicaid Other

## 2014-04-12 VITALS — BP 141/77 | HR 75 | Temp 98.3°F | Resp 20 | Ht 71.0 in | Wt 260.8 lb

## 2014-04-12 DIAGNOSIS — C787 Secondary malignant neoplasm of liver and intrahepatic bile duct: Secondary | ICD-10-CM | POA: Insufficient documentation

## 2014-04-12 DIAGNOSIS — C7800 Secondary malignant neoplasm of unspecified lung: Secondary | ICD-10-CM | POA: Diagnosis not present

## 2014-04-12 DIAGNOSIS — C189 Malignant neoplasm of colon, unspecified: Secondary | ICD-10-CM | POA: Diagnosis not present

## 2014-04-12 DIAGNOSIS — D509 Iron deficiency anemia, unspecified: Secondary | ICD-10-CM

## 2014-04-12 DIAGNOSIS — C187 Malignant neoplasm of sigmoid colon: Secondary | ICD-10-CM

## 2014-04-12 DIAGNOSIS — Z95828 Presence of other vascular implants and grafts: Secondary | ICD-10-CM

## 2014-04-12 DIAGNOSIS — Z923 Personal history of irradiation: Secondary | ICD-10-CM | POA: Diagnosis not present

## 2014-04-12 DIAGNOSIS — Z452 Encounter for adjustment and management of vascular access device: Secondary | ICD-10-CM

## 2014-04-12 DIAGNOSIS — Z86718 Personal history of other venous thrombosis and embolism: Secondary | ICD-10-CM

## 2014-04-12 LAB — CEA: CEA: 8.2 ng/mL — ABNORMAL HIGH (ref 0.0–5.0)

## 2014-04-12 MED ORDER — HEPARIN SOD (PORK) LOCK FLUSH 100 UNIT/ML IV SOLN
500.0000 [IU] | Freq: Once | INTRAVENOUS | Status: AC
  Administered 2014-04-12: 500 [IU] via INTRAVENOUS
  Filled 2014-04-12: qty 5

## 2014-04-12 MED ORDER — SODIUM CHLORIDE 0.9 % IJ SOLN
10.0000 mL | INTRAMUSCULAR | Status: DC | PRN
Start: 2014-04-12 — End: 2014-04-12
  Administered 2014-04-12: 10 mL via INTRAVENOUS
  Filled 2014-04-12: qty 10

## 2014-04-12 NOTE — Telephone Encounter (Signed)
Pt confirmed labs/ov per 12/17 POF, gave pt AVS...Marland KitchenMarland KitchenMarland Kitchen KJ

## 2014-04-12 NOTE — Patient Instructions (Signed)

## 2014-04-12 NOTE — Telephone Encounter (Signed)
added appt....pt comming back to get sched.

## 2014-04-12 NOTE — Progress Notes (Signed)
[facility] OFFICE PROGRESS NOTE   Diagnosis: Colon cancer  INTERVAL HISTORY:   Ms. Sorter returns as scheduled. She feels well. No complaint. No dyspnea.  Objective:  Vital signs in last 24 hours:  Blood pressure 141/77, pulse 75, temperature 98.3 F (36.8 C), temperature source Oral, resp. rate 20, height $RemoveBe'5\' 11"'jqxhoMgHi$  (1.803 m), weight 260 lb 12.8 oz (118.298 kg), SpO2 100 %.    HEENT: Neck without mass Resp: Lungs clear bilaterally Cardio: Regular rate and rhythm GI: No hepatomegaly, nontender Vascular: No leg edema  Portacath/PICC-without erythema  Lab Results:   Lab Results  Component Value Date   CEA 2.6 03/07/2014    Imaging:  Ct Chest Wo Contrast  04/12/2014   CLINICAL DATA:  Metastatic colon cancer. Bilateral Pulmonary metastasis with radiation therapy. Liver metastasis.  EXAM: CT CHEST WITHOUT CONTRAST  TECHNIQUE: Multidetector CT imaging of the chest was performed following the standard protocol without IV contrast.  COMPARISON:  CT thorax 01/12/2014, PET-CT 529 2000 15  FINDINGS: Lungs: There is reduction in airspace disease within the left and right upper lobe at site of directed radiation therapy for metastatic pulmonary nodules. No discrete nodules identified in the right upper lobe. The previous seen left upper lobe nodule measuring 12 mm may be present centrally in the left upper lobe measuring 10 mm on image 15, series 5. Likewise the right lower lobe nodule is not identified in the airspace disease which is decreased in volume (image 31, series 5).  No new pulmonary nodules are present.  No axillary or supraclavicular lymphadenopathy. No mediastinal hilar lymphadenopathy. No pericardial fluid. Esophagus is normal.  Review of the upper abdomen is normal adrenal glands.  No aggressive osseous lesion identified.  IMPRESSION: 1. Decreased in the airspace disease surrounding stereotactic radiation treatments sites in the left upper lobe, right upper lobe,  and right lower lobe. Discrete nodules are difficult to measure. No evidence of increased nodularity. 2. No new pulmonary nodules are present.   Electronically Signed   By: Suzy Bouchard M.D.   On: 04/12/2014 09:03    Medications: I have reviewed the patient's current medications.  Assessment/Plan: 1. Metastatic colon cancer (T4, N0, M1) adenocarcinoma of the sigmoid colon status post sigmoid colectomy and partial right hepatectomy 04/11/2012. No loss of mismatch repair proteins. K-ras wild-type on the initial codon 12 and 13 testing. K-ras Q61L mutation identified on extended testing.  She completed cycle 1 CAPOX beginning 05/25/2012.   She completed cycle 8 CAPOX beginning 10/19/2012.   CEA on 10/19/2012 normal at 0.8.   CEA 6.1 on 01/11/2013   Restaging CT on 01/11/2013 consistent with multiple new pulmonary metastases   Cycle 1 of FOLFIRI/Avastin 01/18/2013; cycle 2 02/01/2013,cycle 3 02/16/2013.   CEA 5.0 on 03/01/2013.   Cycle 4 FOLFIRI/Avastin 03/01/2013.   Cycle 5 FOLFIRI/Avastin 03/15/2013.   Restaging chest CT 03/29/2013 with possible slight decrease in size of a left upper lobe metastasis. Other pulmonary nodules stable.   Cycle 6 FOLFIRI/Avastin 03/29/2013   Cycle 7 of FOLFIRI/Avastin 04/12/2013.   CEA 6.3 05/03/2013.   Cycle 8 FOLFIRI/Avastin 05/03/2013.   Cycle 9 FOLFIRI/Avastin 05/17/2013.   Cycle 10 FOLFIRI/Avastin 05/31/2013.   CEA 10.6 on 06/19/2013.   CT chest 06/19/2013 with mild progression of bilateral pulmonary nodules/metastases.   Decision made to continue FOLFIRI/Avastin. She completed cycle 11 06/21/2013.   Cycle 12 FOLFIRI/Avastin 07/05/2013   Cycle 13 FOLFIRI/Avastin 07/19/2013   Cycle 14 FOLFIRI/Avastin 08/01/2013.   CT chest 08/28/2013 with increased in  size of bilateral lung nodules.   PET scan 09/22/2013 with multiple hypermetabolic pulmonary nodules bilaterally. Nodules demonstrate progressive  enlargement compared with prior CTs. No evidence of extrathoracic metastatic disease.   Status post SRS 2 lung nodules, completed 10/18/2013  CT 01/12/2014 with airspace disease surrounding treated pulmonary nodules, no new nodules  01/12/2014 CEA 0.9  CT chest 04/12/2014 with improvement in airspace disease surrounding treated pulmonary nodules, no new nodules 2. Microcytic anemia, iron deficiency. Improved.  3. Status post Port-A-Cath placement 03/14/2012. X-ray showed the Port-A-Cath tip to be in the azygos vein. The left-sided Port-A-Cath was removed and a new right Port-A-Cath was placed on 05/23/2012. 4. 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 5. Hypertension-persistent on amlodipine. Lisinopril 5 mg daily added on 03/01/2013. Lisinopril increased to 10 mg daily on 03/15/2013. Increased to 20 mg 07/19/2013. 6. Delayed nausea following cycle 5 FOLFIRI/Avastin. Emend was added to the premedication regimen     Disposition:  Ms. Hoefer appears stable. The chest CT reveals improvement in the inflammatory changes following radiation. No clinical or CT evidence of progressive disease. She continues Xarelto anticoagulation. Ms. Leduc will return for an office visit and CEA in 6 weeks.  Betsy Coder, MD  04/12/2014  10:11 AM

## 2014-04-15 ENCOUNTER — Other Ambulatory Visit: Payer: Self-pay | Admitting: Oncology

## 2014-05-16 ENCOUNTER — Other Ambulatory Visit: Payer: Self-pay | Admitting: Oncology

## 2014-05-24 ENCOUNTER — Other Ambulatory Visit: Payer: Medicaid Other

## 2014-05-24 ENCOUNTER — Ambulatory Visit (HOSPITAL_BASED_OUTPATIENT_CLINIC_OR_DEPARTMENT_OTHER): Payer: Medicaid Other

## 2014-05-24 ENCOUNTER — Ambulatory Visit: Payer: Medicaid Other | Admitting: Nurse Practitioner

## 2014-05-24 ENCOUNTER — Encounter: Payer: Self-pay | Admitting: *Deleted

## 2014-05-24 ENCOUNTER — Telehealth: Payer: Self-pay | Admitting: Nurse Practitioner

## 2014-05-24 ENCOUNTER — Ambulatory Visit (HOSPITAL_BASED_OUTPATIENT_CLINIC_OR_DEPARTMENT_OTHER): Payer: Medicaid Other | Admitting: Nurse Practitioner

## 2014-05-24 VITALS — BP 153/91 | HR 70 | Temp 98.1°F | Resp 18 | Wt 266.5 lb

## 2014-05-24 DIAGNOSIS — C189 Malignant neoplasm of colon, unspecified: Secondary | ICD-10-CM

## 2014-05-24 DIAGNOSIS — C787 Secondary malignant neoplasm of liver and intrahepatic bile duct: Secondary | ICD-10-CM

## 2014-05-24 DIAGNOSIS — C7800 Secondary malignant neoplasm of unspecified lung: Secondary | ICD-10-CM

## 2014-05-24 DIAGNOSIS — D509 Iron deficiency anemia, unspecified: Secondary | ICD-10-CM

## 2014-05-24 DIAGNOSIS — R110 Nausea: Secondary | ICD-10-CM

## 2014-05-24 DIAGNOSIS — C187 Malignant neoplasm of sigmoid colon: Secondary | ICD-10-CM

## 2014-05-24 DIAGNOSIS — C3490 Malignant neoplasm of unspecified part of unspecified bronchus or lung: Secondary | ICD-10-CM

## 2014-05-24 MED ORDER — HEPARIN SOD (PORK) LOCK FLUSH 100 UNIT/ML IV SOLN
500.0000 [IU] | Freq: Once | INTRAVENOUS | Status: AC
  Administered 2014-05-24: 500 [IU] via INTRAVENOUS
  Filled 2014-05-24: qty 5

## 2014-05-24 MED ORDER — SODIUM CHLORIDE 0.9 % IJ SOLN
10.0000 mL | INTRAMUSCULAR | Status: DC | PRN
  Administered 2014-05-24: 10 mL via INTRAVENOUS
  Filled 2014-05-24: qty 10

## 2014-05-24 MED ORDER — LOPERAMIDE HCL 2 MG PO CAPS: 2.0000 mg | ORAL_CAPSULE | ORAL | Status: DC | PRN

## 2014-05-24 NOTE — Progress Notes (Addendum)
[name]   Diagnosis:  Colon cancer  INTERVAL HISTORY:   [name] returns as scheduled. She feels well. She has a good appetite. She denies pain. No shortness breath. She denies nausea/vomiting. No diarrhea or constipation. No bleeding.  Objective:  Vital signs in last 24 hours:  Blood pressure 153/91, pulse 70, temperature 98.1 F (36.7 C), temperature source Oral, resp. rate 18, weight 266 lb 8 oz (120.884 kg).    HEENT: No thrush or ulcers. Resp: Lungs clear bilaterally. Cardio: Regular rate and rhythm. GI: Abdomen soft and nontender. The hepatomegaly. Vascular: No leg edema. Port-A-Cath without erythema.   Lab Results:  Lab Results  Component Value Date   WBC 4.7 09/06/2013   HGB 12.2 09/06/2013   HCT 37.0 09/06/2013   MCV 91.3 09/06/2013   PLT 225 09/06/2013   NEUTROABS 1.9 09/06/2013    Imaging:  No results found.  Medications: I have reviewed the patient's current medications.  Assessment/Plan: 1. Metastatic colon cancer (T4, N0, M1) adenocarcinoma of the sigmoid colon status post sigmoid colectomy and partial right hepatectomy 04/11/2012. No loss of mismatch repair proteins. K-ras wild-type on the initial codon 12 and 13 testing. K-ras Q61L mutation identified on extended testing.  She completed cycle 1 CAPOX beginning 05/25/2012.   She completed cycle 8 CAPOX beginning 10/19/2012.   CEA on 10/19/2012 normal at 0.8.   CEA 6.1 on 01/11/2013   Restaging CT on 01/11/2013 consistent with multiple new pulmonary metastases   Cycle 1 of FOLFIRI/Avastin 01/18/2013; cycle 2 02/01/2013,cycle 3 02/16/2013.   CEA 5.0 on 03/01/2013.   Cycle 4 FOLFIRI/Avastin 03/01/2013.   Cycle 5 FOLFIRI/Avastin 03/15/2013.   Restaging chest CT 03/29/2013 with possible slight decrease in size of a left upper lobe metastasis. Other pulmonary nodules stable.   Cycle 6 FOLFIRI/Avastin 03/29/2013   Cycle 7 of  FOLFIRI/Avastin 04/12/2013.   CEA 6.3 05/03/2013.   Cycle 8 FOLFIRI/Avastin 05/03/2013.   Cycle 9 FOLFIRI/Avastin 05/17/2013.   Cycle 10 FOLFIRI/Avastin 05/31/2013.   CEA 10.6 on 06/19/2013.   CT chest 06/19/2013 with mild progression of bilateral pulmonary nodules/metastases.   Decision made to continue FOLFIRI/Avastin. She completed cycle 11 06/21/2013.   Cycle 12 FOLFIRI/Avastin 07/05/2013   Cycle 13 FOLFIRI/Avastin 07/19/2013   Cycle 14 FOLFIRI/Avastin 08/01/2013.   CT chest 08/28/2013 with increased in size of bilateral lung nodules.   PET scan 09/22/2013 with multiple hypermetabolic pulmonary nodules bilaterally. Nodules demonstrate progressive enlargement compared with prior CTs. No evidence of extrathoracic metastatic disease.   Status post SRS 2 lung nodules, completed 10/18/2013  CT 01/12/2014 with airspace disease surrounding treated pulmonary nodules, no new nodules  01/12/2014 CEA 0.9  CT chest 04/12/2014 with improvement in airspace disease surrounding treated pulmonary nodules, no new nodules 2. Microcytic anemia, iron deficiency. Improved.  3. Status post Port-A-Cath placement 03/14/2012. X-ray showed the Port-A-Cath tip to be in the azygos vein. The left-sided Port-A-Cath was removed and a new right Port-A-Cath was placed on 05/23/2012. 4. 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 5. Hypertension-persistent on amlodipine. Lisinopril 5 mg daily added on 03/01/2013. Lisinopril increased to 10 mg daily on 03/15/2013. Increased to 20 mg 07/19/2013. 6. Delayed nausea following cycle 5 FOLFIRI/Avastin. Emend was added to the premedication regimen   Disposition: [name] appears well. There is no clinical evidence of progressive disease. We will follow-up on the CEA from today. She will return for a follow-up visit, CEA and Port-A-Cath  flush in 6 weeks.  Plan reviewed with Dr.  Benay Spice.  [name] ANP/GNP-BC   05/24/2014  10:48 AM

## 2014-05-24 NOTE — Telephone Encounter (Signed)
Gave avs & calendar for March. °

## 2014-05-24 NOTE — Progress Notes (Signed)
[facility] Work  Clinical Social Work was referred by patient for assessment of psychosocial needs and assistance with SCAT recertification.  Clinical Social Worker completed SCAT application with pt and faxed it to SCAT on behalf of pt. Pt denied other needs currently, she is aware of how to contact CSW if needs arise.     Clinical Social Work interventions: Resource coordination  [name], [place] Worker [place]  [place] Phone: [phone] Fax: [phone]

## 2014-05-25 ENCOUNTER — Telehealth: Payer: Self-pay | Admitting: *Deleted

## 2014-05-25 LAB — CEA: CEA: 29.5 ng/mL — AB (ref 0.0–5.0)

## 2014-05-25 NOTE — Telephone Encounter (Signed)
Called pt with lab results. CEA is higher, will recheck with next port flush. Per Dr. Benay Spice, if higher will schedule CT scan. She voiced understanding.

## 2014-05-25 NOTE — Telephone Encounter (Signed)
-----   Message from Ladell Pier, MD sent at 05/25/2014  2:06 PM EST ----- Please call patient, cea is higher, repeat with next port. Flush, if higher again we will schedule CTs

## 2014-05-25 NOTE — Telephone Encounter (Signed)
Call from pt's daughter reporting pt is upset and crying after receiving her lab results. She reports pt does not want to receive calls about her labs. Spoke with pt, support given. Explained we want her to be informed during her care here. Nothing needs to be done urgently, we will check labs again in 6 weeks. Encouraged her to call office with any questions.

## 2014-06-01 ENCOUNTER — Telehealth: Payer: Self-pay | Admitting: Nurse Practitioner

## 2014-06-01 ENCOUNTER — Other Ambulatory Visit: Payer: Self-pay | Admitting: Nurse Practitioner

## 2014-06-01 ENCOUNTER — Telehealth: Payer: Self-pay

## 2014-06-01 DIAGNOSIS — C787 Secondary malignant neoplasm of liver and intrahepatic bile duct: Principal | ICD-10-CM

## 2014-06-01 DIAGNOSIS — C189 Malignant neoplasm of colon, unspecified: Secondary | ICD-10-CM

## 2014-06-01 NOTE — Telephone Encounter (Signed)
Pt confirmed labs/ov per 02/05 POF, gave pt AVS... KJ

## 2014-06-01 NOTE — Telephone Encounter (Signed)
Pt is concerned about her elevating CEA. The plan is recheck on 3/10. Pt asking if needs to come in sooner. Please call her back.

## 2014-06-01 NOTE — Telephone Encounter (Signed)
I spoke with [name] regarding the recent CEA result. We decided to schedule an office visit and repeat CEA at a 4 week interval rather than a 6 week interval. Orders entered into EPIC and POF submitted for labs and an appointment on 06/21/2014.

## 2014-06-15 ENCOUNTER — Other Ambulatory Visit: Payer: Self-pay | Admitting: Oncology

## 2014-06-21 ENCOUNTER — Ambulatory Visit: Payer: Medicaid Other

## 2014-06-21 ENCOUNTER — Ambulatory Visit (HOSPITAL_BASED_OUTPATIENT_CLINIC_OR_DEPARTMENT_OTHER): Payer: Medicaid Other | Admitting: Nurse Practitioner

## 2014-06-21 ENCOUNTER — Other Ambulatory Visit: Payer: Medicaid Other

## 2014-06-21 VITALS — BP 165/82 | HR 71 | Temp 98.2°F | Resp 18 | Ht 71.0 in | Wt 266.9 lb

## 2014-06-21 DIAGNOSIS — C787 Secondary malignant neoplasm of liver and intrahepatic bile duct: Principal | ICD-10-CM

## 2014-06-21 DIAGNOSIS — C189 Malignant neoplasm of colon, unspecified: Secondary | ICD-10-CM

## 2014-06-21 DIAGNOSIS — Z95828 Presence of other vascular implants and grafts: Secondary | ICD-10-CM

## 2014-06-21 DIAGNOSIS — C187 Malignant neoplasm of sigmoid colon: Secondary | ICD-10-CM

## 2014-06-21 DIAGNOSIS — D509 Iron deficiency anemia, unspecified: Secondary | ICD-10-CM

## 2014-06-21 DIAGNOSIS — Z86718 Personal history of other venous thrombosis and embolism: Secondary | ICD-10-CM

## 2014-06-21 DIAGNOSIS — R110 Nausea: Secondary | ICD-10-CM

## 2014-06-21 DIAGNOSIS — I10 Essential (primary) hypertension: Secondary | ICD-10-CM

## 2014-06-21 DIAGNOSIS — C7801 Secondary malignant neoplasm of right lung: Secondary | ICD-10-CM

## 2014-06-21 DIAGNOSIS — C7802 Secondary malignant neoplasm of left lung: Secondary | ICD-10-CM

## 2014-06-21 MED ORDER — SODIUM CHLORIDE 0.9 % IJ SOLN
10.0000 mL | INTRAMUSCULAR | Status: DC | PRN
  Administered 2014-06-21: 10 mL via INTRAVENOUS
  Filled 2014-06-21: qty 10

## 2014-06-21 MED ORDER — HEPARIN SOD (PORK) LOCK FLUSH 100 UNIT/ML IV SOLN
500.0000 [IU] | Freq: Once | INTRAVENOUS | Status: AC
  Administered 2014-06-21: 500 [IU] via INTRAVENOUS
  Filled 2014-06-21: qty 5

## 2014-06-21 NOTE — Progress Notes (Addendum)
[facility] OFFICE PROGRESS NOTE   Diagnosis:  Colon cancer  INTERVAL HISTORY:   [name] returns prior to scheduled follow-up discussed the recent elevation in the CEA and to obtain a repeat value. She feels well. She has a good appetite and good energy level. She denies shortness of breath. No cough. No fever. She denies abdominal pain. No diarrhea. No nausea or vomiting.  Objective:  Vital signs in last 24 hours:  Blood pressure 165/82, pulse 71, temperature 98.2 F (36.8 C), temperature source Oral, resp. rate 18, height $RemoveBe'5\' 11"'aXyvtnDEU$  (1.803 m), weight 266 lb 14.4 oz (121.065 kg), SpO2 100 %.    HEENT: No thrush or ulcers. Lymphatics: No palpable cervical, supra clavicular, axillary or inguinal lymph nodes. Resp: Lungs clear bilaterally. Cardio: Regular rate and rhythm. GI: Abdomen soft and nontender. No hepatomegaly. Vascular: No leg edema. Port-A-Cath without erythema.    Lab Results:  Lab Results  Component Value Date   WBC 4.7 09/06/2013   HGB 12.2 09/06/2013   HCT 37.0 09/06/2013   MCV 91.3 09/06/2013   PLT 225 09/06/2013   NEUTROABS 1.9 09/06/2013    Imaging:  No results found.  Medications: I have reviewed the patient's current medications.  Assessment/Plan: 1. Metastatic colon cancer (T4, N0, M1) adenocarcinoma of the sigmoid colon status post sigmoid colectomy and partial right hepatectomy 04/11/2012. Microsatellite stable. No loss of mismatch repair proteins. K-ras wild-type on the initial codon 12 and 13 testing. K-ras Q61L mutation identified on extended testing. BRAF not mutated.  She completed cycle 1 CAPOX beginning 05/25/2012.   She completed cycle 8 CAPOX beginning 10/19/2012.   CEA on 10/19/2012 normal at 0.8.   CEA 6.1 on 01/11/2013   Restaging CT on 01/11/2013 consistent with multiple new pulmonary metastases   Cycle 1 of FOLFIRI/Avastin 01/18/2013; cycle 2 02/01/2013,cycle 3 02/16/2013.   CEA 5.0 on 03/01/2013.    Cycle 4 FOLFIRI/Avastin 03/01/2013.   Cycle 5 FOLFIRI/Avastin 03/15/2013.   Restaging chest CT 03/29/2013 with possible slight decrease in size of a left upper lobe metastasis. Other pulmonary nodules stable.   Cycle 6 FOLFIRI/Avastin 03/29/2013   Cycle 7 of FOLFIRI/Avastin 04/12/2013.   CEA 6.3 05/03/2013.   Cycle 8 FOLFIRI/Avastin 05/03/2013.   Cycle 9 FOLFIRI/Avastin 05/17/2013.   Cycle 10 FOLFIRI/Avastin 05/31/2013.   CEA 10.6 on 06/19/2013.   CT chest 06/19/2013 with mild progression of bilateral pulmonary nodules/metastases.   Decision made to continue FOLFIRI/Avastin. She completed cycle 11 06/21/2013.   Cycle 12 FOLFIRI/Avastin 07/05/2013   Cycle 13 FOLFIRI/Avastin 07/19/2013   Cycle 14 FOLFIRI/Avastin 08/01/2013.   CT chest 08/28/2013 with increased in size of bilateral lung nodules.   PET scan 09/22/2013 with multiple hypermetabolic pulmonary nodules bilaterally. Nodules demonstrate progressive enlargement compared with prior CTs. No evidence of extrathoracic metastatic disease.   Status post SRS 2 lung nodules, completed 10/18/2013  CT 01/12/2014 with airspace disease surrounding treated pulmonary nodules, no new nodules  01/12/2014 CEA 0.9  CT chest 04/12/2014 with improvement in airspace disease surrounding treated pulmonary nodules, no new nodules  04/12/2014 CEA 8.2  05/25/2014 CEA 29.5 2. Microcytic anemia, iron deficiency. Improved.  3. Status post Port-A-Cath placement 03/14/2012. X-ray showed the Port-A-Cath tip to be in the azygos vein. The left-sided Port-A-Cath was removed and a new right Port-A-Cath was placed on 05/23/2012. 4. 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 5. Hypertension-persistent on amlodipine. Lisinopril 5 mg daily added on 03/01/2013. Lisinopril increased to 10  mg daily on 03/15/2013. Increased to 20 mg 07/19/2013. 6. Delayed nausea  following cycle 5 FOLFIRI/Avastin. Emend was added to the premedication regimen     Disposition: [name] appears well. The CEA has increased over the past 6+ weeks. We will follow-up on the CEA from today. If higher, the plan is to proceed with a restaging CT scan of the chest.  She is scheduled to return for a follow-up visit on 07/05/2014.  Patient seen with Dr. Benay Spice.    [name] ANP/GNP-BC   06/21/2014  12:33 PM This was a shared visit with [name]. We will follow-up on the CEA from today and decide on obtaining restaging CT scans.  [name], M.D.

## 2014-06-21 NOTE — Patient Instructions (Signed)

## 2014-06-22 LAB — CEA: CEA: 50.5 ng/mL — ABNORMAL HIGH (ref 0.0–5.0)

## 2014-06-27 ENCOUNTER — Other Ambulatory Visit: Payer: Self-pay | Admitting: Nurse Practitioner

## 2014-06-27 DIAGNOSIS — C189 Malignant neoplasm of colon, unspecified: Secondary | ICD-10-CM

## 2014-06-27 DIAGNOSIS — C787 Secondary malignant neoplasm of liver and intrahepatic bile duct: Principal | ICD-10-CM

## 2014-06-27 NOTE — Addendum Note (Signed)
Addended by: Owens Shark on: 06/27/2014 03:31 PM   Modules accepted: Level of Service

## 2014-06-29 ENCOUNTER — Other Ambulatory Visit: Payer: Self-pay | Admitting: Oncology

## 2014-06-29 ENCOUNTER — Other Ambulatory Visit: Payer: Self-pay | Admitting: Nurse Practitioner

## 2014-06-29 ENCOUNTER — Other Ambulatory Visit: Payer: Self-pay | Admitting: *Deleted

## 2014-06-29 ENCOUNTER — Telehealth: Payer: Self-pay | Admitting: Oncology

## 2014-06-29 DIAGNOSIS — C189 Malignant neoplasm of colon, unspecified: Secondary | ICD-10-CM

## 2014-06-29 DIAGNOSIS — C787 Secondary malignant neoplasm of liver and intrahepatic bile duct: Principal | ICD-10-CM

## 2014-06-29 NOTE — Telephone Encounter (Signed)
S/w pt's son confirming labs/flush per 03/04 POF, pt's son states pt's home # was not correct when I went in to correct it someone had already gone in and fixed it. I advised [name] someone would need to come p/u her contrast for her CT same day of labs/flush.... Cherylann Banas

## 2014-06-29 NOTE — Telephone Encounter (Signed)
Labs/flush moved to day of CT which has not been scheduled waiting on approval, dialed pt's home # states # is not in working service.... KJ

## 2014-07-03 ENCOUNTER — Encounter (HOSPITAL_COMMUNITY): Payer: Self-pay

## 2014-07-03 ENCOUNTER — Ambulatory Visit (HOSPITAL_BASED_OUTPATIENT_CLINIC_OR_DEPARTMENT_OTHER): Payer: Medicaid Other

## 2014-07-03 ENCOUNTER — Ambulatory Visit (HOSPITAL_COMMUNITY)
Admission: RE | Admit: 2014-07-03 | Discharge: 2014-07-03 | Disposition: A | Discharge status: Home / Self Care | Payer: Medicaid Other | Source: Ambulatory Visit | Attending: Nurse Practitioner | Admitting: Nurse Practitioner

## 2014-07-03 ENCOUNTER — Other Ambulatory Visit (HOSPITAL_BASED_OUTPATIENT_CLINIC_OR_DEPARTMENT_OTHER): Payer: Medicaid Other

## 2014-07-03 DIAGNOSIS — C787 Secondary malignant neoplasm of liver and intrahepatic bile duct: Secondary | ICD-10-CM | POA: Insufficient documentation

## 2014-07-03 DIAGNOSIS — C189 Malignant neoplasm of colon, unspecified: Secondary | ICD-10-CM

## 2014-07-03 DIAGNOSIS — C7800 Secondary malignant neoplasm of unspecified lung: Secondary | ICD-10-CM | POA: Diagnosis not present

## 2014-07-03 DIAGNOSIS — K439 Ventral hernia without obstruction or gangrene: Secondary | ICD-10-CM | POA: Insufficient documentation

## 2014-07-03 DIAGNOSIS — D509 Iron deficiency anemia, unspecified: Secondary | ICD-10-CM

## 2014-07-03 DIAGNOSIS — I517 Cardiomegaly: Secondary | ICD-10-CM | POA: Diagnosis not present

## 2014-07-03 DIAGNOSIS — Z95828 Presence of other vascular implants and grafts: Secondary | ICD-10-CM

## 2014-07-03 DIAGNOSIS — K429 Umbilical hernia without obstruction or gangrene: Secondary | ICD-10-CM | POA: Insufficient documentation

## 2014-07-03 DIAGNOSIS — Z9221 Personal history of antineoplastic chemotherapy: Secondary | ICD-10-CM | POA: Diagnosis not present

## 2014-07-03 DIAGNOSIS — I7090 Unspecified atherosclerosis: Secondary | ICD-10-CM | POA: Diagnosis not present

## 2014-07-03 DIAGNOSIS — E041 Nontoxic single thyroid nodule: Secondary | ICD-10-CM | POA: Insufficient documentation

## 2014-07-03 DIAGNOSIS — Z452 Encounter for adjustment and management of vascular access device: Secondary | ICD-10-CM

## 2014-07-03 DIAGNOSIS — C187 Malignant neoplasm of sigmoid colon: Secondary | ICD-10-CM

## 2014-07-03 LAB — COMPREHENSIVE METABOLIC PANEL (CC13)
ALT: 30 U/L (ref 0–55)
AST: 37 U/L — ABNORMAL HIGH (ref 5–34)
Albumin: 4.1 g/dL (ref 3.5–5.0)
Alkaline Phosphatase: 74 U/L (ref 40–150)
Anion Gap: 10 mEq/L (ref 3–11)
BUN: 12.4 mg/dL (ref 7.0–26.0)
CO2: 26 mEq/L (ref 22–29)
Calcium: 9.8 mg/dL (ref 8.4–10.4)
Chloride: 105 mEq/L (ref 98–109)
Creatinine: 0.9 mg/dL (ref 0.6–1.1)
EGFR: 82 mL/min/{1.73_m2} — ABNORMAL LOW (ref >90)
GLUCOSE: 97 mg/dL (ref 70–140)
Potassium: 4 mEq/L (ref 3.5–5.1)
Sodium: 141 mEq/L (ref 136–145)
Total Bilirubin: 0.61 mg/dL (ref 0.20–1.20)
Total Protein: 7.6 g/dL (ref 6.4–8.3)

## 2014-07-03 MED ORDER — IOHEXOL 300 MG/ML  SOLN
50.0000 mL | Freq: Once | INTRAMUSCULAR | Status: AC | PRN
  Administered 2014-07-03: 50 mL via ORAL

## 2014-07-03 MED ORDER — SODIUM CHLORIDE 0.9 % IJ SOLN
10.0000 mL | INTRAMUSCULAR | Status: DC | PRN
  Administered 2014-07-03: 10 mL via INTRAVENOUS
  Filled 2014-07-03: qty 10

## 2014-07-03 MED ORDER — IOHEXOL 300 MG/ML  SOLN
100.0000 mL | Freq: Once | INTRAMUSCULAR | Status: AC | PRN
  Administered 2014-07-03: 100 mL via INTRAVENOUS

## 2014-07-03 NOTE — Patient Instructions (Signed)

## 2014-07-03 NOTE — Telephone Encounter (Signed)
Pt called stating her potassium has not been refilled. Noted Ned Card NP started a refill encounter. Finished refill encounter and called pt back.

## 2014-07-04 LAB — CEA: CEA: 60.3 ng/mL — ABNORMAL HIGH (ref 0.0–5.0)

## 2014-07-05 ENCOUNTER — Telehealth: Payer: Self-pay | Admitting: Oncology

## 2014-07-05 ENCOUNTER — Other Ambulatory Visit: Payer: Medicaid Other

## 2014-07-05 ENCOUNTER — Ambulatory Visit (HOSPITAL_BASED_OUTPATIENT_CLINIC_OR_DEPARTMENT_OTHER): Payer: Medicaid Other | Admitting: Oncology

## 2014-07-05 VITALS — BP 162/80 | HR 69 | Temp 98.4°F | Resp 18 | Ht 71.0 in | Wt 264.2 lb

## 2014-07-05 DIAGNOSIS — C7801 Secondary malignant neoplasm of right lung: Secondary | ICD-10-CM

## 2014-07-05 DIAGNOSIS — C187 Malignant neoplasm of sigmoid colon: Secondary | ICD-10-CM

## 2014-07-05 DIAGNOSIS — C787 Secondary malignant neoplasm of liver and intrahepatic bile duct: Secondary | ICD-10-CM

## 2014-07-05 DIAGNOSIS — C189 Malignant neoplasm of colon, unspecified: Secondary | ICD-10-CM

## 2014-07-05 DIAGNOSIS — D509 Iron deficiency anemia, unspecified: Secondary | ICD-10-CM

## 2014-07-05 DIAGNOSIS — Z86718 Personal history of other venous thrombosis and embolism: Secondary | ICD-10-CM

## 2014-07-05 DIAGNOSIS — C7802 Secondary malignant neoplasm of left lung: Secondary | ICD-10-CM

## 2014-07-05 DIAGNOSIS — I10 Essential (primary) hypertension: Secondary | ICD-10-CM

## 2014-07-05 NOTE — Telephone Encounter (Signed)
gv and printed appt sched and avs for pt for March and April

## 2014-07-05 NOTE — Progress Notes (Signed)
[place] OFFICE PROGRESS NOTE   Diagnosis: Colon cancer  INTERVAL HISTORY:   [name] returns as scheduled. She feels well. Good appetite. No dyspnea or cough. Oh complaint.  Objective:  Vital signs in last 24 hours:  Blood pressure 162/80, pulse 69, temperature 98.4 F (36.9 C), temperature source Oral, resp. rate 18, height $RemoveBe'5\' 11"'cKxMvZVif$  (1.803 m), weight 264 lb 3.2 oz (119.84 kg), SpO2 100 %.    HEENT: Neck without mass Lymphatics: No cervical or supra-clavicular nodes Resp: Lungs clear bilaterally Cardio: Regular rate and rhythm GI: No hepatosplenomegaly, nontender, no mass Vascular: No leg edema   Portacath/PICC-without erythema  Lab Results:  Lab Results  Component Value Date   WBC 4.7 09/06/2013   HGB 12.2 09/06/2013   HCT 37.0 09/06/2013   MCV 91.3 09/06/2013   PLT 225 09/06/2013   NEUTROABS 1.9 09/06/2013    Lab Results  Component Value Date   NA 141 07/03/2014    Lab Results  Component Value Date   CEA 60.3* 07/03/2014    Imaging:  Ct Chest W Contrast  07/03/2014   CLINICAL DATA:  Subsequent evaluation of 59 year old female with history of metastatic colon cancer diagnosed in October 2013 with liver and pulmonary metastasis status post partial colonic resection and chemotherapy which is now complete.  EXAM: CT CHEST, ABDOMEN, AND PELVIS WITH CONTRAST  TECHNIQUE: Multidetector CT imaging of the chest, abdomen and pelvis was performed following the standard protocol during bolus administration of intravenous contrast.  CONTRAST:  181mL OMNIPAQUE IOHEXOL 300 MG/ML  SOLN  COMPARISON:  Chest CT 04/12/2014.  PET-CT 09/22/2013.  FINDINGS: CT CHEST FINDINGS  Mediastinum/Lymph Nodes: Heart size is mildly enlarged. There is no significant pericardial fluid, thickening or pericardial calcification. No pathologically enlarged mediastinal or hilar lymph nodes. Esophagus is unremarkable in appearance. No axillary lymphadenopathy. Well-defined 2.4 x 1.6 cm  intermediate attenuation nodule in the right lobe of the thyroid gland is similar to prior examinations, and nonspecific (but not hypermetabolic on prior PET-CT), favored to be benign. Right subclavian single-lumen porta cath with tip terminating in the right atrium.  Lungs/Pleura: Previously treated lesions in the lateral segment of the right middle lobe and in the central aspect of the left upper lobe are more masslike in appearance than prior examinations, concerning for locally recurrent disease. Specifically, the right middle lobe lesion currently measures 3.2 x 2.5 cm (image 40 of series 2), substantially more bulky than the prior study. Additionally, the left upper lobe lesion currently measures 3.2 x 4.0 cm (image 26 of series 2), also substantially more bulky than the prior examination. There continues to be some adjacent architectural distortion in the surrounding lung parenchyma near both of these lesions, compatible with some mild post radiation scarring. No new suspicious appearing pulmonary nodules or masses are otherwise noted. No acute consolidative airspace disease. No pleural effusions.  Musculoskeletal/Soft Tissues: There are no aggressive appearing lytic or blastic lesions noted in the visualized portions of the skeleton.  CT ABDOMEN AND PELVIS FINDINGS  Hepatobiliary: Postoperative changes of partial hepatectomy in the inferior aspect of the right lobe of the liver, similar to prior examinations. No cystic or solid hepatic lesions are identified on today's examination. No intra or extrahepatic biliary ductal dilatation. Status post cholecystectomy.  Pancreas: Unremarkable.  Spleen: Unremarkable.  Adrenals/Urinary Tract: Bilateral adrenal glands and bilateral kidneys are normal in appearance. No hydroureteronephrosis. Urinary bladder is normal in appearance.  Stomach/Bowel: Normal appearance of the stomach. No pathologic dilatation of small bowel  or colon. Suture line near the rectosigmoid  junction related to prior partial colectomy. No soft tissue mass adjacent to the suture line to suggest local recurrence of disease.  Vascular/Lymphatic: Mild atherosclerosis throughout the abdominal and pelvic vasculature, without evidence of aneurysm. Retroaortic left renal vein (normal anatomical variant) incidentally noted. No lymphadenopathy noted in the abdomen or pelvis.  Reproductive: Status post hysterectomy. Ovaries are not confidently identified may be surgically absent or atrophic.  Other: Small umbilical hernia containing a short segment of small bowel, without evidence of incarceration or obstruction at this time. Tiny ventral hernia in the supraumbilical region containing only omental fat incidentally noted. No significant volume of ascites. No pneumoperitoneum.  Musculoskeletal: There are no aggressive appearing lytic or blastic lesions noted in the visualized portions of the skeleton.  IMPRESSION: 1. Enlarging masses in the lateral segment of the right middle lobe and central aspect of the left upper lobe, concerning for local recurrence of disease at sites of previously treated pulmonary metastases. This could be confirmed with PET-CT if clinically appropriate. 2. No new sites of metastatic disease noted elsewhere in the chest, abdomen or pelvis. 3. Additional incidental findings, as above, similar to prior examinations. These results will be called to the ordering clinician or representative by the Radiologist Assistant, and communication documented in the PACS or zVision Dashboard.   Electronically Signed   By: Vinnie Langton M.D.   On: 07/03/2014 17:20   Ct Abdomen Pelvis W Contrast  07/03/2014   CLINICAL DATA:  Subsequent evaluation of 59 year old female with history of metastatic colon cancer diagnosed in October 2013 with liver and pulmonary metastasis status post partial colonic resection and chemotherapy which is now complete.  EXAM: CT CHEST, ABDOMEN, AND PELVIS WITH CONTRAST   TECHNIQUE: Multidetector CT imaging of the chest, abdomen and pelvis was performed following the standard protocol during bolus administration of intravenous contrast.  CONTRAST:  162mL OMNIPAQUE IOHEXOL 300 MG/ML  SOLN  COMPARISON:  Chest CT 04/12/2014.  PET-CT 09/22/2013.  FINDINGS: CT CHEST FINDINGS  Mediastinum/Lymph Nodes: Heart size is mildly enlarged. There is no significant pericardial fluid, thickening or pericardial calcification. No pathologically enlarged mediastinal or hilar lymph nodes. Esophagus is unremarkable in appearance. No axillary lymphadenopathy. Well-defined 2.4 x 1.6 cm intermediate attenuation nodule in the right lobe of the thyroid gland is similar to prior examinations, and nonspecific (but not hypermetabolic on prior PET-CT), favored to be benign. Right subclavian single-lumen porta cath with tip terminating in the right atrium.  Lungs/Pleura: Previously treated lesions in the lateral segment of the right middle lobe and in the central aspect of the left upper lobe are more masslike in appearance than prior examinations, concerning for locally recurrent disease. Specifically, the right middle lobe lesion currently measures 3.2 x 2.5 cm (image 40 of series 2), substantially more bulky than the prior study. Additionally, the left upper lobe lesion currently measures 3.2 x 4.0 cm (image 26 of series 2), also substantially more bulky than the prior examination. There continues to be some adjacent architectural distortion in the surrounding lung parenchyma near both of these lesions, compatible with some mild post radiation scarring. No new suspicious appearing pulmonary nodules or masses are otherwise noted. No acute consolidative airspace disease. No pleural effusions.  Musculoskeletal/Soft Tissues: There are no aggressive appearing lytic or blastic lesions noted in the visualized portions of the skeleton.  CT ABDOMEN AND PELVIS FINDINGS  Hepatobiliary: Postoperative changes of partial  hepatectomy in the inferior aspect of the right lobe of the  liver, similar to prior examinations. No cystic or solid hepatic lesions are identified on today's examination. No intra or extrahepatic biliary ductal dilatation. Status post cholecystectomy.  Pancreas: Unremarkable.  Spleen: Unremarkable.  Adrenals/Urinary Tract: Bilateral adrenal glands and bilateral kidneys are normal in appearance. No hydroureteronephrosis. Urinary bladder is normal in appearance.  Stomach/Bowel: Normal appearance of the stomach. No pathologic dilatation of small bowel or colon. Suture line near the rectosigmoid junction related to prior partial colectomy. No soft tissue mass adjacent to the suture line to suggest local recurrence of disease.  Vascular/Lymphatic: Mild atherosclerosis throughout the abdominal and pelvic vasculature, without evidence of aneurysm. Retroaortic left renal vein (normal anatomical variant) incidentally noted. No lymphadenopathy noted in the abdomen or pelvis.  Reproductive: Status post hysterectomy. Ovaries are not confidently identified may be surgically absent or atrophic.  Other: Small umbilical hernia containing a short segment of small bowel, without evidence of incarceration or obstruction at this time. Tiny ventral hernia in the supraumbilical region containing only omental fat incidentally noted. No significant volume of ascites. No pneumoperitoneum.  Musculoskeletal: There are no aggressive appearing lytic or blastic lesions noted in the visualized portions of the skeleton.  IMPRESSION: 1. Enlarging masses in the lateral segment of the right middle lobe and central aspect of the left upper lobe, concerning for local recurrence of disease at sites of previously treated pulmonary metastases. This could be confirmed with PET-CT if clinically appropriate. 2. No new sites of metastatic disease noted elsewhere in the chest, abdomen or pelvis. 3. Additional incidental findings, as above, similar to prior  examinations. These results will be called to the ordering clinician or representative by the Radiologist Assistant, and communication documented in the PACS or zVision Dashboard.   Electronically Signed   By: Vinnie Langton M.D.   On: 07/03/2014 17:20    Medications: I have reviewed the patient's current medications.  Assessment/Plan: 1. Metastatic colon cancer (T4, N0, M1) adenocarcinoma of the sigmoid colon status post sigmoid colectomy and partial right hepatectomy 04/11/2012. Microsatellite stable. No loss of mismatch repair proteins. K-ras wild-type on the initial codon 12 and 13 testing. K-ras Q61L mutation identified on extended testing. BRAF not mutated.  She completed cycle 1 CAPOX beginning 05/25/2012.   She completed cycle 8 CAPOX beginning 10/19/2012.   CEA on 10/19/2012 normal at 0.8.   CEA 6.1 on 01/11/2013   Restaging CT on 01/11/2013 consistent with multiple new pulmonary metastases   Cycle 1 of FOLFIRI/Avastin 01/18/2013; cycle 2 02/01/2013,cycle 3 02/16/2013.   CEA 5.0 on 03/01/2013.   Cycle 4 FOLFIRI/Avastin 03/01/2013.   Cycle 5 FOLFIRI/Avastin 03/15/2013.   Restaging chest CT 03/29/2013 with possible slight decrease in size of a left upper lobe metastasis. Other pulmonary nodules stable.   Cycle 6 FOLFIRI/Avastin 03/29/2013   Cycle 7 of FOLFIRI/Avastin 04/12/2013.   CEA 6.3 05/03/2013.   Cycle 8 FOLFIRI/Avastin 05/03/2013.   Cycle 9 FOLFIRI/Avastin 05/17/2013.   Cycle 10 FOLFIRI/Avastin 05/31/2013.   CEA 10.6 on 06/19/2013.   CT chest 06/19/2013 with mild progression of bilateral pulmonary nodules/metastases.   Decision made to continue FOLFIRI/Avastin. She completed cycle 11 06/21/2013.   Cycle 12 FOLFIRI/Avastin 07/05/2013   Cycle 13 FOLFIRI/Avastin 07/19/2013   Cycle 14 FOLFIRI/Avastin 08/01/2013.   CT chest 08/28/2013 with increased in size of bilateral lung nodules.   PET scan 09/22/2013 with multiple  hypermetabolic pulmonary nodules bilaterally. Nodules demonstrate progressive enlargement compared with prior CTs. No evidence of extrathoracic metastatic disease.   Status post SRS 2 lung nodules, completed  10/18/2013  CT 01/12/2014 with airspace disease surrounding treated pulmonary nodules, no new nodules  01/12/2014 CEA 0.9  CT chest 04/12/2014 with improvement in airspace disease surrounding treated pulmonary nodules, no new nodules  04/12/2014 CEA 8.2  07/03/2014 CEA 60.3  CT 07/03/2014 with masslike enlargement of 2 treated lung lesions 2. Microcytic anemia, iron deficiency. Improved.  3. Status post Port-A-Cath placement 03/14/2012. X-ray showed the Port-A-Cath tip to be in the azygos vein. The left-sided Port-A-Cath was removed and a new right Port-A-Cath was placed on 05/23/2012. 4. 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 5. Hypertension-persistent on amlodipine. Lisinopril 5 mg daily added on 03/01/2013. Lisinopril increased to 10 mg daily on 03/15/2013. Increased to 20 mg 07/19/2013. 6. Delayed nausea following cycle 5 FOLFIRI/Avastin. Emend was added to the premedication regimen     Disposition:  Ms. Sonnen appears unchanged. The CEA is higher and the restaging CT indicates progression of lung lesions. I discussed treatment options with her. Systemic options are limited given her previous treatment and the K-ras mutation. We discussed Lonsurf and regorafenib. She is unable to travel for a clinical trial. We can also consider repeat treatment with oxaliplatin-based therapy.  We decided to begin a trial of Lonsurf. We reviewed potential toxicities associated with this agent including the chance for hematologic toxicity, nausea, and diarrhea. She agrees to proceed. The plan is to begin a first cycle on 07/12/2014.  Betsy Coder, MD  07/05/2014  12:26 PM

## 2014-07-06 ENCOUNTER — Other Ambulatory Visit: Payer: Self-pay | Admitting: *Deleted

## 2014-07-06 MED ORDER — TRIFLURIDINE-TIPIRACIL 15-6.14 MG PO TABS: 75.0000 mg | ORAL_TABLET | Freq: Two times a day (BID) | ORAL | Status: DC

## 2014-07-06 NOTE — Telephone Encounter (Signed)
[facility] to follow up on [place] Rx. They had to order med, co-pay will be $3. They will contact pt when med is available.

## 2014-07-11 ENCOUNTER — Other Ambulatory Visit (HOSPITAL_BASED_OUTPATIENT_CLINIC_OR_DEPARTMENT_OTHER): Payer: Medicaid Other

## 2014-07-11 ENCOUNTER — Telehealth: Payer: Self-pay | Admitting: *Deleted

## 2014-07-11 DIAGNOSIS — C787 Secondary malignant neoplasm of liver and intrahepatic bile duct: Principal | ICD-10-CM

## 2014-07-11 DIAGNOSIS — C189 Malignant neoplasm of colon, unspecified: Secondary | ICD-10-CM

## 2014-07-11 DIAGNOSIS — C187 Malignant neoplasm of sigmoid colon: Secondary | ICD-10-CM

## 2014-07-11 LAB — CBC WITH DIFFERENTIAL/PLATELET
BASO%: 1.4 % (ref 0.0–2.0)
Basophils Absolute: 0.1 10*3/uL (ref 0.0–0.1)
EOS%: 4.3 % (ref 0.0–7.0)
Eosinophils Absolute: 0.2 10*3/uL (ref 0.0–0.5)
HCT: 37.3 % (ref 34.8–46.6)
HGB: 11.8 g/dL (ref 11.6–15.9)
LYMPH#: 1.4 10*3/uL (ref 0.9–3.3)
LYMPH%: 24.6 % (ref 14.0–49.7)
MCH: 27.9 pg (ref 25.1–34.0)
MCHC: 31.6 g/dL (ref 31.5–36.0)
MCV: 88.3 fL (ref 79.5–101.0)
MONO#: 0.3 10*3/uL (ref 0.1–0.9)
MONO%: 5.7 % (ref 0.0–14.0)
NEUT#: 3.7 10*3/uL (ref 1.5–6.5)
NEUT%: 64 % (ref 38.4–76.8)
Platelets: 179 10*3/uL (ref 145–400)
RBC: 4.22 10*6/uL (ref 3.70–5.45)
RDW: 14.7 % — ABNORMAL HIGH (ref 11.2–14.5)
WBC: 5.8 10*3/uL (ref 3.9–10.3)

## 2014-07-11 LAB — CEA: CEA: 61.5 ng/mL — AB (ref 0.0–5.0)

## 2014-07-11 NOTE — Telephone Encounter (Signed)
Patient called to say she does not understand how to take the [place] that was prescribed yesterday.  Talked her through the instructions, which she wrote down.  Completed a calendar of when to take and when to hold the medication for the first 28 day cycle and mailed it to her.

## 2014-07-11 NOTE — Telephone Encounter (Signed)
Left VM for [name] to call and let us know if she has received her Lonsurf and has any questions about it before she starts.

## 2014-07-16 ENCOUNTER — Other Ambulatory Visit: Payer: Self-pay | Admitting: Oncology

## 2014-07-26 ENCOUNTER — Other Ambulatory Visit (HOSPITAL_BASED_OUTPATIENT_CLINIC_OR_DEPARTMENT_OTHER): Payer: Medicaid Other

## 2014-07-26 DIAGNOSIS — C787 Secondary malignant neoplasm of liver and intrahepatic bile duct: Principal | ICD-10-CM

## 2014-07-26 DIAGNOSIS — C189 Malignant neoplasm of colon, unspecified: Secondary | ICD-10-CM

## 2014-07-26 DIAGNOSIS — C187 Malignant neoplasm of sigmoid colon: Secondary | ICD-10-CM

## 2014-07-26 LAB — CBC WITH DIFFERENTIAL/PLATELET
BASO%: 0.2 % (ref 0.0–2.0)
BASOS ABS: 0 10*3/uL (ref 0.0–0.1)
EOS%: 2.1 % (ref 0.0–7.0)
Eosinophils Absolute: 0.1 10*3/uL (ref 0.0–0.5)
HCT: 33.4 % — ABNORMAL LOW (ref 34.8–46.6)
HEMOGLOBIN: 11.2 g/dL — AB (ref 11.6–15.9)
LYMPH%: 30.5 % (ref 14.0–49.7)
MCH: 29.3 pg (ref 25.1–34.0)
MCHC: 33.5 g/dL (ref 31.5–36.0)
MCV: 87.4 fL (ref 79.5–101.0)
MONO#: 0.2 10*3/uL (ref 0.1–0.9)
MONO%: 3.3 % (ref 0.0–14.0)
NEUT%: 63.9 % (ref 38.4–76.8)
NEUTROS ABS: 3.1 10*3/uL (ref 1.5–6.5)
PLATELETS: 192 10*3/uL (ref 145–400)
RBC: 3.82 10*6/uL (ref 3.70–5.45)
RDW: 14.1 % (ref 11.2–14.5)
WBC: 4.9 10*3/uL (ref 3.9–10.3)
lymph#: 1.5 10*3/uL (ref 0.9–3.3)

## 2014-08-02 ENCOUNTER — Ambulatory Visit
Admission: RE | Admit: 2014-08-02 | Discharge: 2014-08-02 | Disposition: A | Discharge status: Home / Self Care | Payer: Medicaid Other | Source: Ambulatory Visit | Attending: Radiation Oncology | Admitting: Radiation Oncology

## 2014-08-02 VITALS — BP 156/73 | HR 81 | Temp 98.6°F | Wt 265.6 lb

## 2014-08-02 DIAGNOSIS — C7800 Secondary malignant neoplasm of unspecified lung: Secondary | ICD-10-CM

## 2014-08-02 NOTE — Progress Notes (Signed)
Patient here for routine follow up completion of SBRT to lung June 2015 to review ct scan.Has 2 enlarging lung masses but no new activity.Denies pain, shortness of breath or cough.Having some financial issues.Needs help to pay light bill.

## 2014-08-03 ENCOUNTER — Telehealth: Payer: Self-pay | Admitting: *Deleted

## 2014-08-03 NOTE — Telephone Encounter (Signed)
CALLED PATIENT TO INFORM OF FU APPT. FOR 11-01-14 @ 4:30 PM, SPOKE WITH PATIENT AND SHE IS AWARE OF THIS APPT.

## 2014-08-06 ENCOUNTER — Ambulatory Visit (HOSPITAL_BASED_OUTPATIENT_CLINIC_OR_DEPARTMENT_OTHER): Payer: Medicare Other | Admitting: Nurse Practitioner

## 2014-08-06 ENCOUNTER — Telehealth: Payer: Self-pay | Admitting: Oncology

## 2014-08-06 ENCOUNTER — Other Ambulatory Visit (HOSPITAL_BASED_OUTPATIENT_CLINIC_OR_DEPARTMENT_OTHER): Payer: Medicare Other

## 2014-08-06 ENCOUNTER — Encounter: Payer: Self-pay | Admitting: *Deleted

## 2014-08-06 VITALS — BP 157/79 | HR 73 | Temp 98.1°F | Resp 18 | Ht 71.0 in | Wt 263.6 lb

## 2014-08-06 DIAGNOSIS — C787 Secondary malignant neoplasm of liver and intrahepatic bile duct: Principal | ICD-10-CM

## 2014-08-06 DIAGNOSIS — C189 Malignant neoplasm of colon, unspecified: Secondary | ICD-10-CM

## 2014-08-06 DIAGNOSIS — C7802 Secondary malignant neoplasm of left lung: Secondary | ICD-10-CM

## 2014-08-06 DIAGNOSIS — C7801 Secondary malignant neoplasm of right lung: Secondary | ICD-10-CM | POA: Diagnosis not present

## 2014-08-06 DIAGNOSIS — D509 Iron deficiency anemia, unspecified: Secondary | ICD-10-CM

## 2014-08-06 DIAGNOSIS — Z86718 Personal history of other venous thrombosis and embolism: Secondary | ICD-10-CM | POA: Diagnosis not present

## 2014-08-06 DIAGNOSIS — I10 Essential (primary) hypertension: Secondary | ICD-10-CM | POA: Diagnosis not present

## 2014-08-06 DIAGNOSIS — C187 Malignant neoplasm of sigmoid colon: Secondary | ICD-10-CM | POA: Diagnosis present

## 2014-08-06 DIAGNOSIS — C7800 Secondary malignant neoplasm of unspecified lung: Secondary | ICD-10-CM

## 2014-08-06 LAB — COMPREHENSIVE METABOLIC PANEL (CC13)
ALK PHOS: 70 U/L (ref 40–150)
ALT: 25 U/L (ref 0–55)
AST: 23 U/L (ref 5–34)
Albumin: 4.2 g/dL (ref 3.5–5.0)
Anion Gap: 11 mEq/L (ref 3–11)
BILIRUBIN TOTAL: 0.43 mg/dL (ref 0.20–1.20)
BUN: 14.7 mg/dL (ref 7.0–26.0)
CO2: 24 meq/L (ref 22–29)
CREATININE: 0.8 mg/dL (ref 0.6–1.1)
Calcium: 9.7 mg/dL (ref 8.4–10.4)
Chloride: 107 mEq/L (ref 98–109)
EGFR: 89 mL/min/{1.73_m2} — ABNORMAL LOW (ref >90)
Glucose: 101 mg/dl (ref 70–140)
Potassium: 3.9 mEq/L (ref 3.5–5.1)
Sodium: 142 mEq/L (ref 136–145)
TOTAL PROTEIN: 7.6 g/dL (ref 6.4–8.3)

## 2014-08-06 LAB — CBC WITH DIFFERENTIAL/PLATELET
BASO%: 1.6 % (ref 0.0–2.0)
BASOS ABS: 0 10*3/uL (ref 0.0–0.1)
EOS ABS: 0.1 10*3/uL (ref 0.0–0.5)
EOS%: 2.5 % (ref 0.0–7.0)
HEMATOCRIT: 34.2 % — AB (ref 34.8–46.6)
HEMOGLOBIN: 11.3 g/dL — AB (ref 11.6–15.9)
LYMPH%: 43 % (ref 14.0–49.7)
MCH: 29.3 pg (ref 25.1–34.0)
MCHC: 33 g/dL (ref 31.5–36.0)
MCV: 89 fL (ref 79.5–101.0)
MONO#: 0.3 10*3/uL (ref 0.1–0.9)
MONO%: 8.7 % (ref 0.0–14.0)
NEUT#: 1.4 10*3/uL — ABNORMAL LOW (ref 1.5–6.5)
NEUT%: 44.2 % (ref 38.4–76.8)
Platelets: 253 10*3/uL (ref 145–400)
RBC: 3.84 10*6/uL (ref 3.70–5.45)
RDW: 15.8 % — AB (ref 11.2–14.5)
WBC: 3.1 10*3/uL — ABNORMAL LOW (ref 3.9–10.3)
lymph#: 1.3 10*3/uL (ref 0.9–3.3)

## 2014-08-06 MED ORDER — PROCHLORPERAZINE MALEATE 10 MG PO TABS: ORAL_TABLET | ORAL | Status: AC

## 2014-08-06 MED ORDER — LOPERAMIDE HCL 2 MG PO CAPS: 2.0000 mg | ORAL_CAPSULE | ORAL | Status: DC | PRN

## 2014-08-06 NOTE — [facility] Oncology Navigator Note (Signed)
Met with [name] prior to her office visit today. She reports only mild nausea with the Lonsurf, but has been able to eat without difficulty. Her co pay was only $2.00. Feels well and has no limitations in her activity. Denies any barriers to care at this time.

## 2014-08-06 NOTE — Progress Notes (Signed)
[place] OFFICE PROGRESS NOTE   Diagnosis:  Colon cancer  INTERVAL HISTORY:   [name] returns as scheduled. She began a trial of Lonsurf on 07/12/2014. She had mild nausea after completing the first cycle of Lonsurf. No mouth sores. No diarrhea. No skin rash. She denies abdominal pain. No shortness of breath or cough. No fever.  Objective:  Vital signs in last 24 hours:  Blood pressure 157/79, pulse 73, temperature 98.1 F (36.7 C), temperature source Oral, resp. rate 18, height $RemoveBe'5\' 11"'jBpCPNezC$  (1.803 m), weight 263 lb 9.6 oz (119.568 kg), SpO2 100 %.    HEENT: No thrush or ulcers. Resp: Lungs clear bilaterally. Cardio: Regular rate and rhythm. GI: Abdomen soft and nontender. No hepatomegaly. Vascular: No leg edema.  Skin: No rash. Port-A-Cath without erythema.    Lab Results:  Lab Results  Component Value Date   WBC 3.1* 08/06/2014   HGB 11.3* 08/06/2014   HCT 34.2* 08/06/2014   MCV 89.0 08/06/2014   PLT 253 08/06/2014   NEUTROABS 1.4* 08/06/2014    Imaging:  No results found.  Medications: I have reviewed the patient's current medications.  Assessment/Plan: 1. Metastatic colon cancer (T4, N0, M1) adenocarcinoma of the sigmoid colon status post sigmoid colectomy and partial right hepatectomy 04/11/2012. Microsatellite stable. No loss of mismatch repair proteins. K-ras wild-type on the initial codon 12 and 13 testing. K-ras Q61L mutation identified on extended testing. BRAF not mutated.  She completed cycle 1 CAPOX beginning 05/25/2012.   She completed cycle 8 CAPOX beginning 10/19/2012.   CEA on 10/19/2012 normal at 0.8.   CEA 6.1 on 01/11/2013   Restaging CT on 01/11/2013 consistent with multiple new pulmonary metastases   Cycle 1 of FOLFIRI/Avastin 01/18/2013; cycle 2 02/01/2013,cycle 3 02/16/2013.   CEA 5.0 on 03/01/2013.   Cycle 4 FOLFIRI/Avastin 03/01/2013.   Cycle 5 FOLFIRI/Avastin 03/15/2013.   Restaging chest CT 03/29/2013  with possible slight decrease in size of a left upper lobe metastasis. Other pulmonary nodules stable.   Cycle 6 FOLFIRI/Avastin 03/29/2013   Cycle 7 of FOLFIRI/Avastin 04/12/2013.   CEA 6.3 05/03/2013.   Cycle 8 FOLFIRI/Avastin 05/03/2013.   Cycle 9 FOLFIRI/Avastin 05/17/2013.   Cycle 10 FOLFIRI/Avastin 05/31/2013.   CEA 10.6 on 06/19/2013.   CT chest 06/19/2013 with mild progression of bilateral pulmonary nodules/metastases.   Decision made to continue FOLFIRI/Avastin. She completed cycle 11 06/21/2013.   Cycle 12 FOLFIRI/Avastin 07/05/2013   Cycle 13 FOLFIRI/Avastin 07/19/2013   Cycle 14 FOLFIRI/Avastin 08/01/2013.   CT chest 08/28/2013 with increased in size of bilateral lung nodules.   PET scan 09/22/2013 with multiple hypermetabolic pulmonary nodules bilaterally. Nodules demonstrate progressive enlargement compared with prior CTs. No evidence of extrathoracic metastatic disease.   Status post SRS 2 lung nodules, completed 10/18/2013  CT 01/12/2014 with airspace disease surrounding treated pulmonary nodules, no new nodules  01/12/2014 CEA 0.9  CT chest 04/12/2014 with improvement in airspace disease surrounding treated pulmonary nodules, no new nodules  04/12/2014 CEA 8.2  07/03/2014 CEA 60.3  CT 07/03/2014 with masslike enlargement of 2 treated lung lesions  Cycle 1 Lonsurf 07/12/2014. 2. Microcytic anemia, iron deficiency. Improved.  3. Status post Port-A-Cath placement 03/14/2012. X-ray showed the Port-A-Cath tip to be in the azygos vein. The left-sided Port-A-Cath was removed and a new right Port-A-Cath was placed on 05/23/2012. 4. 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 5. Hypertension-persistent on amlodipine. Lisinopril 5 mg daily added on 03/01/2013. Lisinopril increased  to 10 mg daily on 03/15/2013. Increased to 20 mg 07/19/2013. 6. Delayed nausea following cycle 5  FOLFIRI/Avastin. Emend was added to the premedication regimen   Disposition: [name] appears stable. She has completed 1 cycle of Lonsurf. She is mildly neutropenic. We will hold cycle 2 and obtain a repeat CBC on 08/13/2014. She understands to contact the office with fever, chills, other signs of infection.  We will give her further instructions regarding the second cycle of Lonsurf pending the CBC from 08/13/2014. She will return for a follow-up visit on 09/03/2014. She will contact the office in the interim as outlined above or with any other problems.  Plan reviewed with Dr. Benay Spice.    [name] ANP/GNP-BC   08/06/2014  9:58 AM

## 2014-08-06 NOTE — Progress Notes (Signed)
Called and LM with Abigail, CSW. Pt would like for a SW to call her regarding some financial problems.

## 2014-08-06 NOTE — Telephone Encounter (Signed)
Gave and printed appt sched and avs for pt for April and May °

## 2014-08-08 ENCOUNTER — Encounter: Payer: Self-pay | Admitting: Radiation Oncology

## 2014-08-08 NOTE — Progress Notes (Signed)
Radiation Oncology         [phone] ________________________________  Name: [name] MRN: [id number]  Date: 08/02/2014  DOB: July 27, 1955  Follow-Up Visit Note  CC: Betsy Coder, MD  Ladell Pier, MD  Diagnosis:    Metastatic colon cancer with pulmonary metastases  Interval Since Last Radiation:  The patient completed radiation treatment to the lung on 10/18/2013   Narrative:  The patient returns today for routine follow-up.  The patient states that she is doing well at this time over all. She does have some occasional nausea but otherwise feels she is doing fairly well. Her last CT scan showed some enlarging masses in the right middle lobe and left upper lobe which were concerning for local recurrence of previously treated sites of disease. No other evidence of metastatic disease within the chest abdomen or pelvis. Based on this finding and her options, the patient has begun a trial of Lonsurf.  She sees medical oncology early next week.                             ALLERGIES:  has No Known Allergies.  Meds: Current Outpatient Prescriptions  Medication Sig Dispense Refill  . amLODipine (NORVASC) 10 MG tablet TAKE 1 TABLET BY MOUTH EVERY DAY 90 tablet 0  . lidocaine-prilocaine (EMLA) cream Apply topically as needed. Apply to [facility] site 1-2 hours prior to stick and cover with plastic wrap to numb site 30 g 3  . Multiple Vitamins-Minerals (MULTIVITAMIN) tablet Take 1 tablet by mouth daily.    . polyethylene glycol powder (MIRALAX) powder Take 17 g by mouth daily as needed. 255 g 0  . potassium chloride SA (K-DUR,KLOR-CON) 20 MEQ tablet TAKE 1 TABLET BY MOUTH EVERY DAY 90 tablet 0  . XARELTO 20 MG TABS tablet TAKE 1 TABLET BY MOUTH EVERY DAY 30 tablet 3  . loperamide (IMODIUM) 2 MG capsule Take 1 capsule (2 mg total) by mouth as needed for diarrhea or loose stools (max 8 tabs per day). 30 capsule 0  . prochlorperazine (COMPAZINE) 10 MG tablet TAKE 1 TABLET BY MOUTH EVERY 6 HOURS AS  NEEDED FOR NAUSEA OR VOMITING 30 tablet 1  . trifluridine-tipiracil (LONSURF) 15-6.14 MG tablet Take 5 tablets (75 mg of trifluridine total) by mouth 2 (two) times daily after a meal. 1 hr after AM & PM meals on days 1-5, 8-12. Repeat every 28day (Patient not taking: Reported on 08/02/2014) 100 tablet 0   No current facility-administered medications for this encounter.    Physical Findings: The patient is in no acute distress. Patient is alert and oriented.  weight is 265 lb 9.6 oz (120.475 kg). Her temperature is 98.6 F (37 C). Her blood pressure is 156/73 and her pulse is 81. Her oxygen saturation is 100%. .   General: Well-developed, in no acute distress HEENT: Normocephalic, atraumatic Cardiovascular: Regular rate and rhythm Respiratory: Clear to auscultation bilaterally GI: Soft, nontender, normal bowel sounds Extremities: No edema present   Lab Findings: Lab Results  Component Value Date   WBC 3.1* 08/06/2014   HGB 11.3* 08/06/2014   HCT 34.2* 08/06/2014   MCV 89.0 08/06/2014   PLT 253 08/06/2014     Radiographic Findings: No results found.  Impression:    The patient clinically is doing well at this time. She is proceeding with systemic treatment at this time. Possible recurrence of disease within the lung on her recent CT scan which has prompted  a change in systemic treatment. No role in my opinion for additional radiation treatment at this time.  Plan:  Followup in 3 months.  I spent 10 minutes with the patient today, the majority of which was spent counseling the patient on the diagnosis of cancer and coordinating care.   Jodelle Gross, M.D., Ph.D.

## 2014-08-13 ENCOUNTER — Telehealth: Payer: Self-pay | Admitting: *Deleted

## 2014-08-13 ENCOUNTER — Other Ambulatory Visit (HOSPITAL_BASED_OUTPATIENT_CLINIC_OR_DEPARTMENT_OTHER): Payer: Medicare Other

## 2014-08-13 ENCOUNTER — Ambulatory Visit (HOSPITAL_BASED_OUTPATIENT_CLINIC_OR_DEPARTMENT_OTHER): Payer: Medicare Other

## 2014-08-13 ENCOUNTER — Encounter: Payer: Self-pay | Admitting: *Deleted

## 2014-08-13 VITALS — BP 153/93 | HR 62 | Temp 98.3°F

## 2014-08-13 DIAGNOSIS — C787 Secondary malignant neoplasm of liver and intrahepatic bile duct: Principal | ICD-10-CM

## 2014-08-13 DIAGNOSIS — Z95828 Presence of other vascular implants and grafts: Secondary | ICD-10-CM

## 2014-08-13 DIAGNOSIS — C189 Malignant neoplasm of colon, unspecified: Secondary | ICD-10-CM

## 2014-08-13 LAB — CBC WITH DIFFERENTIAL/PLATELET
BASO%: 1 % (ref 0.0–2.0)
BASOS ABS: 0 10*3/uL (ref 0.0–0.1)
EOS ABS: 0 10*3/uL (ref 0.0–0.5)
EOS%: 1 % (ref 0.0–7.0)
HEMATOCRIT: 35.3 % (ref 34.8–46.6)
HGB: 11.5 g/dL — ABNORMAL LOW (ref 11.6–15.9)
LYMPH%: 42.9 % (ref 14.0–49.7)
MCH: 29.5 pg (ref 25.1–34.0)
MCHC: 32.6 g/dL (ref 31.5–36.0)
MCV: 90.5 fL (ref 79.5–101.0)
MONO#: 0.3 10*3/uL (ref 0.1–0.9)
MONO%: 8.2 % (ref 0.0–14.0)
NEUT#: 2 10*3/uL (ref 1.5–6.5)
NEUT%: 46.9 % (ref 38.4–76.8)
Platelets: 222 10*3/uL (ref 145–400)
RBC: 3.9 10*6/uL (ref 3.70–5.45)
RDW: 16.3 % — AB (ref 11.2–14.5)
WBC: 4.2 10*3/uL (ref 3.9–10.3)
lymph#: 1.8 10*3/uL (ref 0.9–3.3)

## 2014-08-13 MED ORDER — HEPARIN SOD (PORK) LOCK FLUSH 100 UNIT/ML IV SOLN
500.0000 [IU] | Freq: Once | INTRAVENOUS | Status: AC
  Administered 2014-08-13: 500 [IU] via INTRAVENOUS
  Filled 2014-08-13: qty 5

## 2014-08-13 MED ORDER — SODIUM CHLORIDE 0.9 % IJ SOLN
10.0000 mL | INTRAMUSCULAR | Status: DC | PRN
  Administered 2014-08-13: 10 mL via INTRAVENOUS
  Filled 2014-08-13: qty 10

## 2014-08-13 MED ORDER — TRIFLURIDINE-TIPIRACIL 15-6.14 MG PO TABS: 75.0000 mg | ORAL_TABLET | Freq: Two times a day (BID) | ORAL | Status: DC

## 2014-08-13 NOTE — Telephone Encounter (Signed)
Called and informed patient that white count is better and she can proceed with the next cycle of Lonsurf.  Per Elby Showers. Marcello Moores, NP.  Patient verbalized understanding.

## 2014-08-13 NOTE — Progress Notes (Signed)
[place] Work  Holiday representative met with patient in office at [facility] to offer support and discuss financial concerns.  CSW and patient discussed patients concerns with her disability.  Patient reported that [employer] had sent her a letter stating her monthly check would be corrected to the original amount she was awarded.  CSW and patient reviewed additional financial assistance opportunities.  CSW and patient call Cancer Care and completed the intial application process.  Once patient received application in the mail she will bring to CSW to complete.  CSw and patient also complete the online application for financial assistance through [employer].  Patient did not express any additional needs at this time.  CSW encouraged patient to call with any questions or concerns.    Johnnye Lana, MSW, LCSW, OSW-C Clinical Social Worker [facility] [phone]

## 2014-08-13 NOTE — Patient Instructions (Signed)

## 2014-08-13 NOTE — Telephone Encounter (Signed)
-----   Message from Owens Shark, NP sent at 08/13/2014  3:40 PM EDT -----  Please let her know the white count is better. She can proceed with the next cycle of Lonsurf.

## 2014-08-14 ENCOUNTER — Other Ambulatory Visit: Payer: Self-pay | Admitting: *Deleted

## 2014-08-14 DIAGNOSIS — C189 Malignant neoplasm of colon, unspecified: Secondary | ICD-10-CM

## 2014-08-14 DIAGNOSIS — C787 Secondary malignant neoplasm of liver and intrahepatic bile duct: Principal | ICD-10-CM

## 2014-08-14 MED ORDER — TRIFLURIDINE-TIPIRACIL 15-6.14 MG PO TABS: 75.0000 mg | ORAL_TABLET | Freq: Two times a day (BID) | ORAL | Status: DC

## 2014-08-14 MED ORDER — TRIFLURIDINE-TIPIRACIL 15-6.14 MG PO TABS: ORAL_TABLET | ORAL | Status: DC

## 2014-09-03 ENCOUNTER — Ambulatory Visit (HOSPITAL_BASED_OUTPATIENT_CLINIC_OR_DEPARTMENT_OTHER): Payer: Medicare Other | Admitting: Nurse Practitioner

## 2014-09-03 ENCOUNTER — Encounter: Payer: Self-pay | Admitting: *Deleted

## 2014-09-03 ENCOUNTER — Ambulatory Visit (HOSPITAL_BASED_OUTPATIENT_CLINIC_OR_DEPARTMENT_OTHER): Payer: Medicare Other

## 2014-09-03 ENCOUNTER — Telehealth: Payer: Self-pay | Admitting: Oncology

## 2014-09-03 ENCOUNTER — Other Ambulatory Visit (HOSPITAL_BASED_OUTPATIENT_CLINIC_OR_DEPARTMENT_OTHER): Payer: Medicare Other

## 2014-09-03 VITALS — BP 164/85 | HR 75 | Temp 98.1°F | Resp 19 | Ht 71.0 in | Wt 269.7 lb

## 2014-09-03 DIAGNOSIS — D509 Iron deficiency anemia, unspecified: Secondary | ICD-10-CM

## 2014-09-03 DIAGNOSIS — Z452 Encounter for adjustment and management of vascular access device: Secondary | ICD-10-CM

## 2014-09-03 DIAGNOSIS — C187 Malignant neoplasm of sigmoid colon: Secondary | ICD-10-CM

## 2014-09-03 DIAGNOSIS — C7802 Secondary malignant neoplasm of left lung: Secondary | ICD-10-CM | POA: Diagnosis not present

## 2014-09-03 DIAGNOSIS — C787 Secondary malignant neoplasm of liver and intrahepatic bile duct: Principal | ICD-10-CM

## 2014-09-03 DIAGNOSIS — C7801 Secondary malignant neoplasm of right lung: Secondary | ICD-10-CM | POA: Diagnosis not present

## 2014-09-03 DIAGNOSIS — C189 Malignant neoplasm of colon, unspecified: Secondary | ICD-10-CM

## 2014-09-03 DIAGNOSIS — Z95828 Presence of other vascular implants and grafts: Secondary | ICD-10-CM

## 2014-09-03 LAB — COMPREHENSIVE METABOLIC PANEL (CC13)
ALBUMIN: 3.9 g/dL (ref 3.5–5.0)
ALK PHOS: 68 U/L (ref 40–150)
ALT: 29 U/L (ref 0–55)
AST: 23 U/L (ref 5–34)
Anion Gap: 10 mEq/L (ref 3–11)
BUN: 12.6 mg/dL (ref 7.0–26.0)
CALCIUM: 9.5 mg/dL (ref 8.4–10.4)
CO2: 25 mEq/L (ref 22–29)
Chloride: 108 mEq/L (ref 98–109)
Creatinine: 0.8 mg/dL (ref 0.6–1.1)
EGFR: >90 mL/min/{1.73_m2} (ref >90)
Glucose: 96 mg/dl (ref 70–140)
Potassium: 4.1 mEq/L (ref 3.5–5.1)
SODIUM: 142 meq/L (ref 136–145)
Total Bilirubin: 0.53 mg/dL (ref 0.20–1.20)
Total Protein: 7.1 g/dL (ref 6.4–8.3)

## 2014-09-03 LAB — CBC WITH DIFFERENTIAL/PLATELET
BASO%: 1.2 % (ref 0.0–2.0)
Basophils Absolute: 0 10*3/uL (ref 0.0–0.1)
EOS%: 2.8 % (ref 0.0–7.0)
Eosinophils Absolute: 0.1 10*3/uL (ref 0.0–0.5)
HEMATOCRIT: 30.7 % — AB (ref 34.8–46.6)
HGB: 10.2 g/dL — ABNORMAL LOW (ref 11.6–15.9)
LYMPH%: 32.8 % (ref 14.0–49.7)
MCH: 29.6 pg (ref 25.1–34.0)
MCHC: 33.1 g/dL (ref 31.5–36.0)
MCV: 89.2 fL (ref 79.5–101.0)
MONO#: 0.2 10*3/uL (ref 0.1–0.9)
MONO%: 5.2 % (ref 0.0–14.0)
NEUT#: 2.1 10*3/uL (ref 1.5–6.5)
NEUT%: 58 % (ref 38.4–76.8)
Platelets: 212 10*3/uL (ref 145–400)
RBC: 3.45 10*6/uL — ABNORMAL LOW (ref 3.70–5.45)
RDW: 16.9 % — AB (ref 11.2–14.5)
WBC: 3.6 10*3/uL — AB (ref 3.9–10.3)
lymph#: 1.2 10*3/uL (ref 0.9–3.3)

## 2014-09-03 MED ORDER — SODIUM CHLORIDE 0.9 % IJ SOLN
10.0000 mL | INTRAMUSCULAR | Status: DC | PRN
  Administered 2014-09-03: 10 mL via INTRAVENOUS
  Filled 2014-09-03: qty 10

## 2014-09-03 MED ORDER — HEPARIN SOD (PORK) LOCK FLUSH 100 UNIT/ML IV SOLN
500.0000 [IU] | Freq: Once | INTRAVENOUS | Status: AC
  Administered 2014-09-03: 500 [IU] via INTRAVENOUS
  Filled 2014-09-03: qty 5

## 2014-09-03 MED ORDER — TRIFLURIDINE-TIPIRACIL 15-6.14 MG PO TABS: ORAL_TABLET | ORAL | Status: DC

## 2014-09-03 MED ORDER — ONDANSETRON HCL 8 MG PO TABS: 8.0000 mg | ORAL_TABLET | Freq: Three times a day (TID) | ORAL | Status: DC | PRN

## 2014-09-03 NOTE — Telephone Encounter (Signed)
Gave and printed appt sched and avs for pt for JUNE °

## 2014-09-03 NOTE — Progress Notes (Signed)
[place] OFFICE PROGRESS NOTE   Diagnosis:  Colon cancer  INTERVAL HISTORY:   Ms. Safran returns as scheduled. She completed cycle 2 Lonsurf beginning 08/11/2014 or 08/12/2014. She noted significant nausea days 8 through 12. Compazine was not effective. She had no vomiting. The nausea has resolved. No significant diarrhea. No rash. No abdominal pain. She denies shortness of breath, cough and fever.  Objective:  Vital signs in last 24 hours:  Blood pressure 164/85, pulse 75, temperature 98.1 F (36.7 C), temperature source Oral, resp. rate 19, height $RemoveBe'5\' 11"'NKLMFcYSM$  (1.803 m), weight 269 lb 11.2 oz (122.335 kg), SpO2 100 %.    HEENT: No thrush or ulcers. Resp: Lungs clear bilaterally. Cardio: Regular rate and rhythm. GI: Abdomen soft and nontender. No hepatomegaly. Vascular: No leg edema. Skin: No rash. Port-A-Cath without erythema.    Lab Results:  Lab Results  Component Value Date   WBC 3.6* 09/03/2014   HGB 10.2* 09/03/2014   HCT 30.7* 09/03/2014   MCV 89.2 09/03/2014   PLT 212 09/03/2014   NEUTROABS 2.1 09/03/2014    Imaging:  No results found.  Medications: I have reviewed the patient's current medications.  Assessment/Plan: 1. Metastatic colon cancer (T4, N0, M1) adenocarcinoma of the sigmoid colon status post sigmoid colectomy and partial right hepatectomy 04/11/2012. Microsatellite stable. No loss of mismatch repair proteins. K-ras wild-type on the initial codon 12 and 13 testing. K-ras Q61L mutation identified on extended testing. BRAF not mutated.  She completed cycle 1 CAPOX beginning 05/25/2012.   She completed cycle 8 CAPOX beginning 10/19/2012.   CEA on 10/19/2012 normal at 0.8.   CEA 6.1 on 01/11/2013   Restaging CT on 01/11/2013 consistent with multiple new pulmonary metastases   Cycle 1 of FOLFIRI/Avastin 01/18/2013; cycle 2 02/01/2013,cycle 3 02/16/2013.   CEA 5.0 on 03/01/2013.   Cycle 4 FOLFIRI/Avastin 03/01/2013.    Cycle 5 FOLFIRI/Avastin 03/15/2013.   Restaging chest CT 03/29/2013 with possible slight decrease in size of a left upper lobe metastasis. Other pulmonary nodules stable.   Cycle 6 FOLFIRI/Avastin 03/29/2013   Cycle 7 of FOLFIRI/Avastin 04/12/2013.   CEA 6.3 05/03/2013.   Cycle 8 FOLFIRI/Avastin 05/03/2013.   Cycle 9 FOLFIRI/Avastin 05/17/2013.   Cycle 10 FOLFIRI/Avastin 05/31/2013.   CEA 10.6 on 06/19/2013.   CT chest 06/19/2013 with mild progression of bilateral pulmonary nodules/metastases.   Decision made to continue FOLFIRI/Avastin. She completed cycle 11 06/21/2013.   Cycle 12 FOLFIRI/Avastin 07/05/2013   Cycle 13 FOLFIRI/Avastin 07/19/2013   Cycle 14 FOLFIRI/Avastin 08/01/2013.   CT chest 08/28/2013 with increased in size of bilateral lung nodules.   PET scan 09/22/2013 with multiple hypermetabolic pulmonary nodules bilaterally. Nodules demonstrate progressive enlargement compared with prior CTs. No evidence of extrathoracic metastatic disease.   Status post SRS 2 lung nodules, completed 10/18/2013  CT 01/12/2014 with airspace disease surrounding treated pulmonary nodules, no new nodules  01/12/2014 CEA 0.9  CT chest 04/12/2014 with improvement in airspace disease surrounding treated pulmonary nodules, no new nodules  04/12/2014 CEA 8.2  07/03/2014 CEA 60.3  CT 07/03/2014 with masslike enlargement of 2 treated lung lesions  Cycle 1 Lonsurf 07/12/2014.  Cycle 2 Lonsurf 08/11/2014 or 08/12/2014 2. Microcytic anemia, iron deficiency. Improved.  3. Status post Port-A-Cath placement 03/14/2012. X-ray showed the Port-A-Cath tip to be in the azygos vein. The left-sided Port-A-Cath was removed and a new right Port-A-Cath was placed on 05/23/2012. 4. 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 5.  Hypertension-persistent on amlodipine. Lisinopril 5 mg daily added on 03/01/2013.  Lisinopril increased to 10 mg daily on 03/15/2013. Increased to 20 mg 07/19/2013. 6. Delayed nausea following cycle 5 FOLFIRI/Avastin. Emend was added to the premedication regimen   Disposition: Ms. Hamil appears stable. She has completed 2 cycles of Lonsurf. Cycle 2 was complicated by nausea. She will begin cycle 3 on 09/08/2014 and will take Zofran 8 mg 30 minutes prior to each dose. She will contact the office if she has nausea despite Zofran.  We will follow-up on the CEA from today. She will have a restaging CT scan of the chest following completion of cycle 3. She will return for a follow-up visit in one month. She will contact the office in the interim as outlined above or with any other problems.  Plan reviewed with Dr. Benay Spice.    Ned Card ANP/GNP-BC   09/03/2014  10:18 AM

## 2014-09-03 NOTE — Patient Instructions (Signed)

## 2014-09-03 NOTE — Progress Notes (Signed)
[place] Work  Holiday representative met with patient in office at [facility] to review Cancer Care application.  Patient and CSW reviewed and completed application.  Patient provided requested income information and CSW submitted application.  CSw encouraged patient to call with questions or concerns.   Johnnye Lana, MSW, LCSW, OSW-C Clinical Social Worker [facility] [phone]

## 2014-09-04 LAB — CEA: CEA: 149.2 ng/mL — ABNORMAL HIGH (ref 0.0–5.0)

## 2014-09-05 ENCOUNTER — Encounter: Payer: Self-pay | Admitting: Oncology

## 2014-09-05 NOTE — Progress Notes (Signed)
I placed [employer] form on desk of nurse for dr. Benay Spice.

## 2014-09-06 ENCOUNTER — Encounter: Payer: Self-pay | Admitting: Oncology

## 2014-09-06 NOTE — Progress Notes (Signed)
I faxed to [employer]  Form for lonsurf  [phone]

## 2014-09-10 ENCOUNTER — Telehealth: Payer: Self-pay | Admitting: *Deleted

## 2014-09-10 NOTE — Telephone Encounter (Signed)
Returned call to pt, offered office visit- she declines. Informed her Dr. Benay Spice will prescribe cough medicine but insurance will not cover antitussives. She stated she will not be able to pick it up. Voice sounds hoarse over the phone- reports this is from coughing so much. Stated she feels a little better, not coughing as much. Informed her it is OK to take the Nyquil as needed.

## 2014-09-10 NOTE — Telephone Encounter (Signed)
PT.'S COUGH IS DRY. SOME NASAL DRAINAGE WHICH IS CLEAR. NO HEAD OR SINUS CONGESTION. NO SHORTNESS OF BREATH. PT. IS UNABLE TO STOP COUGHING. SHE COULD NOT SLEEP LAST NIGHT. PT. DID NOT HAVE ANYTHING TO TAKE EXCEPT NYQUIL WHICH SHE WOULD LIKE TO TAKE TONIGHT IF IT IS OK WITH DR.SHERRILL. WHAT CAN PT. TAKE FOR THE COUGH AND SNEEZING?

## 2014-09-18 ENCOUNTER — Encounter: Payer: Self-pay | Admitting: Oncology

## 2014-09-18 NOTE — Progress Notes (Signed)
I faxed again to [employer] lonsurf requets

## 2014-10-01 ENCOUNTER — Encounter (HOSPITAL_COMMUNITY): Payer: Self-pay

## 2014-10-01 ENCOUNTER — Ambulatory Visit (HOSPITAL_COMMUNITY)
Admission: RE | Admit: 2014-10-01 | Discharge: 2014-10-01 | Disposition: A | Discharge status: Home / Self Care | Payer: Medicare Other | Source: Ambulatory Visit | Attending: Nurse Practitioner | Admitting: Nurse Practitioner

## 2014-10-01 DIAGNOSIS — R918 Other nonspecific abnormal finding of lung field: Secondary | ICD-10-CM | POA: Insufficient documentation

## 2014-10-01 DIAGNOSIS — K760 Fatty (change of) liver, not elsewhere classified: Secondary | ICD-10-CM | POA: Diagnosis not present

## 2014-10-01 DIAGNOSIS — C787 Secondary malignant neoplasm of liver and intrahepatic bile duct: Secondary | ICD-10-CM | POA: Diagnosis not present

## 2014-10-01 DIAGNOSIS — C189 Malignant neoplasm of colon, unspecified: Secondary | ICD-10-CM | POA: Diagnosis not present

## 2014-10-01 MED ORDER — IOHEXOL 300 MG/ML  SOLN
100.0000 mL | Freq: Once | INTRAMUSCULAR | Status: AC | PRN
  Administered 2014-10-01: 80 mL via INTRAVENOUS

## 2014-10-02 ENCOUNTER — Telehealth: Payer: Self-pay | Admitting: Oncology

## 2014-10-02 ENCOUNTER — Ambulatory Visit (HOSPITAL_BASED_OUTPATIENT_CLINIC_OR_DEPARTMENT_OTHER): Payer: Medicare Other | Admitting: Oncology

## 2014-10-02 ENCOUNTER — Other Ambulatory Visit: Payer: Self-pay | Admitting: *Deleted

## 2014-10-02 ENCOUNTER — Other Ambulatory Visit (HOSPITAL_BASED_OUTPATIENT_CLINIC_OR_DEPARTMENT_OTHER): Payer: Medicare Other

## 2014-10-02 VITALS — BP 169/90 | HR 75 | Temp 97.9°F | Resp 20 | Ht 71.0 in | Wt 265.6 lb

## 2014-10-02 DIAGNOSIS — C189 Malignant neoplasm of colon, unspecified: Secondary | ICD-10-CM

## 2014-10-02 DIAGNOSIS — C7802 Secondary malignant neoplasm of left lung: Secondary | ICD-10-CM

## 2014-10-02 DIAGNOSIS — C7801 Secondary malignant neoplasm of right lung: Secondary | ICD-10-CM | POA: Diagnosis not present

## 2014-10-02 DIAGNOSIS — Z7901 Long term (current) use of anticoagulants: Secondary | ICD-10-CM

## 2014-10-02 DIAGNOSIS — C787 Secondary malignant neoplasm of liver and intrahepatic bile duct: Principal | ICD-10-CM

## 2014-10-02 DIAGNOSIS — Z86718 Personal history of other venous thrombosis and embolism: Secondary | ICD-10-CM

## 2014-10-02 DIAGNOSIS — D509 Iron deficiency anemia, unspecified: Secondary | ICD-10-CM

## 2014-10-02 DIAGNOSIS — I10 Essential (primary) hypertension: Secondary | ICD-10-CM

## 2014-10-02 DIAGNOSIS — C187 Malignant neoplasm of sigmoid colon: Secondary | ICD-10-CM

## 2014-10-02 LAB — COMPREHENSIVE METABOLIC PANEL (CC13)
ALBUMIN: 4.1 g/dL (ref 3.5–5.0)
ALT: 27 U/L (ref 0–55)
ANION GAP: 10 meq/L (ref 3–11)
AST: 28 U/L (ref 5–34)
Alkaline Phosphatase: 77 U/L (ref 40–150)
BUN: 13.2 mg/dL (ref 7.0–26.0)
CO2: 25 mEq/L (ref 22–29)
Calcium: 9.9 mg/dL (ref 8.4–10.4)
Chloride: 107 mEq/L (ref 98–109)
Creatinine: 0.9 mg/dL (ref 0.6–1.1)
EGFR: 82 mL/min/{1.73_m2} — ABNORMAL LOW (ref >90)
Glucose: 108 mg/dl (ref 70–140)
POTASSIUM: 3.8 meq/L (ref 3.5–5.1)
Sodium: 142 mEq/L (ref 136–145)
TOTAL PROTEIN: 7.8 g/dL (ref 6.4–8.3)
Total Bilirubin: 0.78 mg/dL (ref 0.20–1.20)

## 2014-10-02 LAB — CBC WITH DIFFERENTIAL/PLATELET
BASO%: 1.1 % (ref 0.0–2.0)
Basophils Absolute: 0 10*3/uL (ref 0.0–0.1)
EOS ABS: 0.1 10*3/uL (ref 0.0–0.5)
EOS%: 2.1 % (ref 0.0–7.0)
HCT: 34.6 % — ABNORMAL LOW (ref 34.8–46.6)
HEMOGLOBIN: 11.6 g/dL (ref 11.6–15.9)
LYMPH#: 1.3 10*3/uL (ref 0.9–3.3)
LYMPH%: 30.6 % (ref 14.0–49.7)
MCH: 30.6 pg (ref 25.1–34.0)
MCHC: 33.5 g/dL (ref 31.5–36.0)
MCV: 91.3 fL (ref 79.5–101.0)
MONO#: 0.4 10*3/uL (ref 0.1–0.9)
MONO%: 8.4 % (ref 0.0–14.0)
NEUT%: 57.8 % (ref 38.4–76.8)
NEUTROS ABS: 2.4 10*3/uL (ref 1.5–6.5)
Platelets: 215 10*3/uL (ref 145–400)
RBC: 3.79 10*6/uL (ref 3.70–5.45)
RDW: 18.7 % — ABNORMAL HIGH (ref 11.2–14.5)
WBC: 4.2 10*3/uL (ref 3.9–10.3)

## 2014-10-02 MED ORDER — TRIFLURIDINE-TIPIRACIL 15-6.14 MG PO TABS: ORAL_TABLET | ORAL | Status: DC

## 2014-10-02 NOTE — [facility] Oncology Navigator Note (Signed)
Oncology Nurse Navigator Documentation  Oncology Nurse Navigator Flowsheets 10/02/2014  Navigator Encounter Type Treatment-Lonsurf  Patient Visit Type Medonc  Treatment Phase Treatment cycle #2  Barriers/Navigation Needs Education-Teachback-management of nausea. Will start taking Zofran 8 mg po 1 hour prior to each dose chemo.   Time Spent with Patient 15

## 2014-10-02 NOTE — Telephone Encounter (Signed)
Gave and printed appt shced and avs fo rpt for JULy

## 2014-10-02 NOTE — Progress Notes (Signed)
Error

## 2014-10-02 NOTE — Progress Notes (Signed)
[place] OFFICE PROGRESS NOTE   Diagnosis: Colon cancer  INTERVAL HISTORY:   [name] returns as scheduled. She began a third cycle of Lonsurf beginning on 09/08/2014. She reports nausea during and following the chemotherapy. She now feels better. No mouth sores or diarrhea. No dyspnea. Good appetite.  Objective:  Vital signs in last 24 hours:  Blood pressure 169/90, pulse 75, temperature 97.9 F (36.6 C), temperature source Oral, resp. rate 20, height _0  (1.803 m), weight 265 lb 9.6 oz (120.475 kg), SpO2 97 %.    HEENT: No thrush or ulcers Lymphatics: No cervical, supraclavicular, axillary, or inguinal nodes Resp: Lungs clear bilaterally Cardio: Regular rate and rhythm GI: No hepatosplenomegaly, nontender, no mass Vascular: No leg edema     Portacath/PICC-without erythema  Lab Results:  Lab Results  Component Value Date   WBC 4.2 10/02/2014   HGB 11.6 10/02/2014   HCT 34.6* 10/02/2014   MCV 91.3 10/02/2014   PLT 215 10/02/2014   NEUTROABS 2.4 10/02/2014      Lab Results  Component Value Date   CEA 149.2* 09/03/2014    Imaging:  Ct Chest W Contrast  10/01/2014   CLINICAL DATA:  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. Restaging. Followup of pulmonary nodules. Diagnosed in 2013.  EXAM: CT CHEST WITH CONTRAST  TECHNIQUE: Multidetector CT imaging of the chest was performed during intravenous contrast administration.  CONTRAST:  75m OMNIPAQUE IOHEXOL 300 MG/ML  SOLN  COMPARISON:  07/03/2014.  FINDINGS: Mediastinum/Nodes: 2.0 cm right-sided thyroid nodule is similar to decreased in size since the prior exam. Right-sided Port-A-Cath which terminates at the cavoatrial junction.  Mild cardiomegaly, without pericardial effusion. No central pulmonary embolism, on this non-dedicated study. Pulmonary artery enlargement, outflow tract 3.2 cm. No middle mediastinal or hilar adenopathy. Prevascular nodes measure up to 13 mm on image 16. Similar to  slightly increased from 12 mm on the prior.  Lungs/Pleura: No pleural fluid. Posterior right middle lobe lung nodule measures 3.2 x 2.7 cm on image 32. Compare 3.3 x 2.6 cm on the prior exam (when remeasured). This suggests stability.  Irregular left upper lobe mass measures 3.3 x 3.9 cm on image 16. 4.0 x 3.2 cm on the prior. This also suggests stability.  No new pulmonary lesions.  Bibasilar scarring.  Upper abdomen: Suspicion of mild hepatic steatosis. Normal imaged portions of the spleen, stomach, pancreas, adrenal glands, kidneys.  Musculoskeletal: No acute osseous abnormality.  IMPRESSION: 1. Similar size of left upper and right middle lobe pulmonary lesions. 2. Similar to minimal increase in prevascular adenopathy which could be reactive or represent nodal metastasis. Given presence back to 03/29/2013, favor a reactive etiology. 3. Mild hepatic steatosis.   Electronically Signed   By: KAbigail MiyamotoM.D.   On: 10/01/2014 11:10    Medications: I have reviewed the patient's current medications.  Assessment/Plan: 1. Metastatic colon cancer (T4, N0, M1) adenocarcinoma of the sigmoid colon status post sigmoid colectomy and partial right hepatectomy 04/11/2012. Microsatellite stable. No loss of mismatch repair proteins. K-ras wild-type on the initial codon 12 and 13 testing. K-ras Q61L mutation identified on extended testing. BRAF not mutated.  She completed cycle 1 CAPOX beginning 05/25/2012.   She completed cycle 8 CAPOX beginning 10/19/2012.   CEA on 10/19/2012 normal at 0.8.   CEA 6.1 on 01/11/2013   Restaging CT on 01/11/2013 consistent with multiple new pulmonary metastases   Cycle 1 of FOLFIRI/Avastin 01/18/2013; cycle 2 02/01/2013,cycle 3 02/16/2013.   CEA 5.0  on 03/01/2013.   Cycle 4 FOLFIRI/Avastin 03/01/2013.   Cycle 5 FOLFIRI/Avastin 03/15/2013.   Restaging chest CT 03/29/2013 with possible slight decrease in size of a left upper lobe metastasis. Other pulmonary nodules  stable.   Cycle 6 FOLFIRI/Avastin 03/29/2013   Cycle 7 of FOLFIRI/Avastin 04/12/2013.   CEA 6.3 05/03/2013.   Cycle 8 FOLFIRI/Avastin 05/03/2013.   Cycle 9 FOLFIRI/Avastin 05/17/2013.   Cycle 10 FOLFIRI/Avastin 05/31/2013.   CEA 10.6 on 06/19/2013.   CT chest 06/19/2013 with mild progression of bilateral pulmonary nodules/metastases.   Decision made to continue FOLFIRI/Avastin. She completed cycle 11 06/21/2013.   Cycle 12 FOLFIRI/Avastin 07/05/2013   Cycle 13 FOLFIRI/Avastin 07/19/2013   Cycle 14 FOLFIRI/Avastin 08/01/2013.   CT chest 08/28/2013 with increased in size of bilateral lung nodules.   PET scan 09/22/2013 with multiple hypermetabolic pulmonary nodules bilaterally. Nodules demonstrate progressive enlargement compared with prior CTs. No evidence of extrathoracic metastatic disease.   Status post SRS 2 lung nodules, completed 10/18/2013  CT 01/12/2014 with airspace disease surrounding treated pulmonary nodules, no new nodules  01/12/2014 CEA 0.9  CT chest 04/12/2014 with improvement in airspace disease surrounding treated pulmonary nodules, no new nodules  04/12/2014 CEA 8.2  07/03/2014 CEA 60.3  CT 07/03/2014 with masslike enlargement of 2 treated lung lesions  Cycle 1 Lonsurf 07/12/2014.  Cycle 2 Lonsurf 08/11/2014 or 08/12/2014  Cycle 3 Lonsurf 09/08/2014  Restaging CT of the chest on 10/01/2014 with stable lung nodules 2. Microcytic anemia, iron deficiency. Improved.  3. Status post Port-A-Cath placement 03/14/2012. X-ray showed the Port-A-Cath tip to be in the azygos vein. The left-sided Port-A-Cath was removed and a new right Port-A-Cath was placed on 05/23/2012. 4. 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 5. Hypertension-persistent on amlodipine. Lisinopril 5 mg daily added on 03/01/2013. Lisinopril increased to 10 mg daily on 03/15/2013. Increased to 20 mg  07/19/2013. 6. Delayed nausea following cycle 5 FOLFIRI/Avastin. Emend was added to the premedication regimen   Disposition:  She has completed 3 cycles of Lonsurf. Her clinical status appears stable and the restaging CT reveals stable disease. We will follow-up on the CEA from today. The plan is to begin cycle 4 on 10/06/2014. We recommended she take Zofran prior to each dose of Lonsurf.  [name] will return for an office and lab visit in one month.  Betsy Coder, MD  10/02/2014  5:53 PM

## 2014-10-31 ENCOUNTER — Other Ambulatory Visit (HOSPITAL_BASED_OUTPATIENT_CLINIC_OR_DEPARTMENT_OTHER): Payer: Medicare Other

## 2014-10-31 ENCOUNTER — Ambulatory Visit (HOSPITAL_BASED_OUTPATIENT_CLINIC_OR_DEPARTMENT_OTHER): Payer: Medicare Other

## 2014-10-31 ENCOUNTER — Telehealth: Payer: Self-pay | Admitting: Oncology

## 2014-10-31 ENCOUNTER — Ambulatory Visit (HOSPITAL_BASED_OUTPATIENT_CLINIC_OR_DEPARTMENT_OTHER): Payer: Medicare Other | Admitting: Nurse Practitioner

## 2014-10-31 ENCOUNTER — Other Ambulatory Visit: Payer: Self-pay | Admitting: *Deleted

## 2014-10-31 VITALS — BP 156/92 | HR 71 | Temp 98.5°F | Resp 18 | Ht 71.0 in | Wt 269.4 lb

## 2014-10-31 DIAGNOSIS — I10 Essential (primary) hypertension: Secondary | ICD-10-CM | POA: Diagnosis not present

## 2014-10-31 DIAGNOSIS — C7802 Secondary malignant neoplasm of left lung: Secondary | ICD-10-CM

## 2014-10-31 DIAGNOSIS — C187 Malignant neoplasm of sigmoid colon: Secondary | ICD-10-CM

## 2014-10-31 DIAGNOSIS — Z86718 Personal history of other venous thrombosis and embolism: Secondary | ICD-10-CM | POA: Diagnosis not present

## 2014-10-31 DIAGNOSIS — C787 Secondary malignant neoplasm of liver and intrahepatic bile duct: Principal | ICD-10-CM

## 2014-10-31 DIAGNOSIS — C189 Malignant neoplasm of colon, unspecified: Secondary | ICD-10-CM

## 2014-10-31 DIAGNOSIS — Z452 Encounter for adjustment and management of vascular access device: Secondary | ICD-10-CM

## 2014-10-31 DIAGNOSIS — C7800 Secondary malignant neoplasm of unspecified lung: Secondary | ICD-10-CM

## 2014-10-31 DIAGNOSIS — C7801 Secondary malignant neoplasm of right lung: Secondary | ICD-10-CM

## 2014-10-31 DIAGNOSIS — Z95828 Presence of other vascular implants and grafts: Secondary | ICD-10-CM

## 2014-10-31 LAB — COMPREHENSIVE METABOLIC PANEL (CC13)
ALT: 29 U/L (ref 0–55)
ANION GAP: 8 meq/L (ref 3–11)
AST: 30 U/L (ref 5–34)
Albumin: 4 g/dL (ref 3.5–5.0)
Alkaline Phosphatase: 70 U/L (ref 40–150)
BUN: 11.7 mg/dL (ref 7.0–26.0)
CALCIUM: 10 mg/dL (ref 8.4–10.4)
CO2: 25 mEq/L (ref 22–29)
Chloride: 108 mEq/L (ref 98–109)
Creatinine: 0.8 mg/dL (ref 0.6–1.1)
EGFR: 89 mL/min/{1.73_m2} — ABNORMAL LOW (ref >90)
GLUCOSE: 94 mg/dL (ref 70–140)
POTASSIUM: 3.9 meq/L (ref 3.5–5.1)
Sodium: 141 mEq/L (ref 136–145)
TOTAL PROTEIN: 7.5 g/dL (ref 6.4–8.3)
Total Bilirubin: 0.78 mg/dL (ref 0.20–1.20)

## 2014-10-31 LAB — CBC WITH DIFFERENTIAL/PLATELET
BASO%: 1.3 % (ref 0.0–2.0)
Basophils Absolute: 0.1 10*3/uL (ref 0.0–0.1)
EOS ABS: 0.1 10*3/uL (ref 0.0–0.5)
EOS%: 2.1 % (ref 0.0–7.0)
HEMATOCRIT: 32 % — AB (ref 34.8–46.6)
HEMOGLOBIN: 10.8 g/dL — AB (ref 11.6–15.9)
LYMPH%: 29.5 % (ref 14.0–49.7)
MCH: 31.8 pg (ref 25.1–34.0)
MCHC: 33.7 g/dL (ref 31.5–36.0)
MCV: 94.2 fL (ref 79.5–101.0)
MONO#: 0.4 10*3/uL (ref 0.1–0.9)
MONO%: 9 % (ref 0.0–14.0)
NEUT%: 58.1 % (ref 38.4–76.8)
NEUTROS ABS: 2.4 10*3/uL (ref 1.5–6.5)
Platelets: 209 10*3/uL (ref 145–400)
RBC: 3.39 10*6/uL — ABNORMAL LOW (ref 3.70–5.45)
RDW: 17.8 % — ABNORMAL HIGH (ref 11.2–14.5)
WBC: 4.2 10*3/uL (ref 3.9–10.3)
lymph#: 1.2 10*3/uL (ref 0.9–3.3)

## 2014-10-31 LAB — CEA: CEA: 235.8 ng/mL — ABNORMAL HIGH (ref 0.0–5.0)

## 2014-10-31 MED ORDER — SODIUM CHLORIDE 0.9 % IJ SOLN
10.0000 mL | INTRAMUSCULAR | Status: DC | PRN
  Administered 2014-10-31: 10 mL via INTRAVENOUS
  Filled 2014-10-31: qty 10

## 2014-10-31 MED ORDER — HEPARIN SOD (PORK) LOCK FLUSH 100 UNIT/ML IV SOLN
500.0000 [IU] | Freq: Once | INTRAVENOUS | Status: AC
  Administered 2014-10-31: 500 [IU] via INTRAVENOUS
  Filled 2014-10-31: qty 5

## 2014-10-31 MED ORDER — TRIFLURIDINE-TIPIRACIL 15-6.14 MG PO TABS: ORAL_TABLET | ORAL | Status: DC

## 2014-10-31 NOTE — Patient Instructions (Signed)

## 2014-10-31 NOTE — Telephone Encounter (Signed)
Gave and printed appt sched and avs for pt Aug

## 2014-10-31 NOTE — Progress Notes (Signed)
[name] OFFICE PROGRESS NOTE   Diagnosis: Colon cancer   INTERVAL HISTORY:   Ms. Elman returns as scheduled. She began cycle 4 Lonsurf on 10/06/2014. She feels well. She had less nausea with the most recent cycle. No diarrhea. No mouth sores. No rash. She has a good appetite. She denies pain. No shortness of breath. She has not taken her blood pressure medication for the past 2 days. She plans on picking up a new prescription today.  Objective:  Vital signs in last 24 hours:  Blood pressure 156/92, pulse 71, temperature 98.5 F (36.9 C), temperature source Oral, resp. rate 18, height $RemoveBe'5\' 11"'AzyUcdLOv$  (1.803 m), weight 269 lb 6.4 oz (122.199 kg), SpO2 100 %.    HEENT: No thrush or ulcers. Lymphatics: No palpable cervical or supraclavicular lymph nodes. Resp: Lungs clear bilaterally. Cardio: Regular rate and rhythm. GI: Abdomen soft and nontender. No hepatomegaly. Vascular: No leg edema. Skin: No rash. Port-A-Cath without erythema.    Lab Results:  Lab Results  Component Value Date   WBC 4.2 10/31/2014   HGB 10.8* 10/31/2014   HCT 32.0* 10/31/2014   MCV 94.2 10/31/2014   PLT 209 10/31/2014   NEUTROABS 2.4 10/31/2014    Imaging:  No results found.  Medications: I have reviewed the patient's current medications.  Assessment/Plan: 1. Metastatic colon cancer (T4, N0, M1) adenocarcinoma of the sigmoid colon status post sigmoid colectomy and partial right hepatectomy 04/11/2012. Microsatellite stable. No loss of mismatch repair proteins. K-ras wild-type on the initial codon 12 and 13 testing. K-ras Q61L mutation identified on extended testing. BRAF not mutated.  She completed cycle 1 CAPOX beginning 05/25/2012.   She completed cycle 8 CAPOX beginning 10/19/2012.   CEA on 10/19/2012 normal at 0.8.   CEA 6.1 on 01/11/2013   Restaging CT on 01/11/2013 consistent with multiple new pulmonary metastases   Cycle 1 of FOLFIRI/Avastin 01/18/2013; cycle 2  02/01/2013,cycle 3 02/16/2013.   CEA 5.0 on 03/01/2013.   Cycle 4 FOLFIRI/Avastin 03/01/2013.   Cycle 5 FOLFIRI/Avastin 03/15/2013.   Restaging chest CT 03/29/2013 with possible slight decrease in size of a left upper lobe metastasis. Other pulmonary nodules stable.   Cycle 6 FOLFIRI/Avastin 03/29/2013   Cycle 7 of FOLFIRI/Avastin 04/12/2013.   CEA 6.3 05/03/2013.   Cycle 8 FOLFIRI/Avastin 05/03/2013.   Cycle 9 FOLFIRI/Avastin 05/17/2013.   Cycle 10 FOLFIRI/Avastin 05/31/2013.   CEA 10.6 on 06/19/2013.   CT chest 06/19/2013 with mild progression of bilateral pulmonary nodules/metastases.   Decision made to continue FOLFIRI/Avastin. She completed cycle 11 06/21/2013.   Cycle 12 FOLFIRI/Avastin 07/05/2013   Cycle 13 FOLFIRI/Avastin 07/19/2013   Cycle 14 FOLFIRI/Avastin 08/01/2013.   CT chest 08/28/2013 with increased in size of bilateral lung nodules.   PET scan 09/22/2013 with multiple hypermetabolic pulmonary nodules bilaterally. Nodules demonstrate progressive enlargement compared with prior CTs. No evidence of extrathoracic metastatic disease.   Status post SRS 2 lung nodules, completed 10/18/2013  CT 01/12/2014 with airspace disease surrounding treated pulmonary nodules, no new nodules  01/12/2014 CEA 0.9  CT chest 04/12/2014 with improvement in airspace disease surrounding treated pulmonary nodules, no new nodules  04/12/2014 CEA 8.2  07/03/2014 CEA 60.3  CT 07/03/2014 with masslike enlargement of 2 treated lung lesions  Cycle 1 Lonsurf 07/12/2014.  Cycle 2 Lonsurf 08/11/2014 or 08/12/2014  Cycle 3 Lonsurf 09/08/2014  Cycle 4 Lonsurf 10/06/2014  Restaging CT of the chest on 10/01/2014 with stable lung nodules 2. Microcytic anemia, iron deficiency. Improved.  3. Status post  Port-A-Cath placement 03/14/2012. X-ray showed the Port-A-Cath tip to be in the azygos vein. The left-sided Port-A-Cath was removed and a new right Port-A-Cath  was placed on 05/23/2012. 4. 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 5. Hypertension-persistent on amlodipine. Lisinopril 5 mg daily added on 03/01/2013. Lisinopril increased to 10 mg daily on 03/15/2013. Increased to 20 mg 07/19/2013. 6. Delayed nausea following cycle 5 FOLFIRI/Avastin. Emend was added to the premedication regimen   Disposition: Ms. Horsey appears stable. She has completed 4 cycles of Lonsurf. Plan to proceed with cycle 5 beginning 11/03/2014. She will return for a follow-up visit in 4 weeks. She will contact the office in the interim with any problems.    Ned Card ANP/GNP-BC   10/31/2014  9:15 AM

## 2014-11-01 ENCOUNTER — Ambulatory Visit: Payer: Medicaid Other | Admitting: Radiation Oncology

## 2014-11-14 ENCOUNTER — Other Ambulatory Visit: Payer: Self-pay | Admitting: Oncology

## 2014-11-28 ENCOUNTER — Other Ambulatory Visit (HOSPITAL_BASED_OUTPATIENT_CLINIC_OR_DEPARTMENT_OTHER): Payer: Medicare Other

## 2014-11-28 ENCOUNTER — Encounter: Payer: Self-pay | Admitting: Radiation Oncology

## 2014-11-28 ENCOUNTER — Telehealth: Payer: Self-pay | Admitting: Oncology

## 2014-11-28 ENCOUNTER — Ambulatory Visit (HOSPITAL_BASED_OUTPATIENT_CLINIC_OR_DEPARTMENT_OTHER): Payer: Medicare Other

## 2014-11-28 ENCOUNTER — Ambulatory Visit (HOSPITAL_BASED_OUTPATIENT_CLINIC_OR_DEPARTMENT_OTHER): Payer: Medicare Other | Admitting: Oncology

## 2014-11-28 ENCOUNTER — Ambulatory Visit
Admission: RE | Admit: 2014-11-28 | Discharge: 2014-11-28 | Disposition: A | Discharge status: Home / Self Care | Payer: Medicare Other | Source: Ambulatory Visit | Attending: Radiation Oncology | Admitting: Radiation Oncology

## 2014-11-28 VITALS — BP 129/82 | HR 77 | Temp 97.8°F | Resp 20 | Ht 71.0 in | Wt 269.8 lb

## 2014-11-28 VITALS — BP 154/89 | HR 79 | Temp 98.1°F | Resp 19 | Ht 71.0 in | Wt 269.0 lb

## 2014-11-28 DIAGNOSIS — Z452 Encounter for adjustment and management of vascular access device: Secondary | ICD-10-CM

## 2014-11-28 DIAGNOSIS — C7802 Secondary malignant neoplasm of left lung: Secondary | ICD-10-CM | POA: Diagnosis not present

## 2014-11-28 DIAGNOSIS — C7801 Secondary malignant neoplasm of right lung: Secondary | ICD-10-CM | POA: Diagnosis not present

## 2014-11-28 DIAGNOSIS — Z86718 Personal history of other venous thrombosis and embolism: Secondary | ICD-10-CM

## 2014-11-28 DIAGNOSIS — C187 Malignant neoplasm of sigmoid colon: Secondary | ICD-10-CM

## 2014-11-28 DIAGNOSIS — C7800 Secondary malignant neoplasm of unspecified lung: Secondary | ICD-10-CM

## 2014-11-28 DIAGNOSIS — C787 Secondary malignant neoplasm of liver and intrahepatic bile duct: Secondary | ICD-10-CM | POA: Diagnosis not present

## 2014-11-28 DIAGNOSIS — D509 Iron deficiency anemia, unspecified: Secondary | ICD-10-CM

## 2014-11-28 DIAGNOSIS — I10 Essential (primary) hypertension: Secondary | ICD-10-CM

## 2014-11-28 DIAGNOSIS — Z95828 Presence of other vascular implants and grafts: Secondary | ICD-10-CM

## 2014-11-28 DIAGNOSIS — C189 Malignant neoplasm of colon, unspecified: Secondary | ICD-10-CM

## 2014-11-28 LAB — COMPREHENSIVE METABOLIC PANEL (CC13)
ALT: 37 U/L (ref 0–55)
AST: 30 U/L (ref 5–34)
Albumin: 4.2 g/dL (ref 3.5–5.0)
Alkaline Phosphatase: 78 U/L (ref 40–150)
Anion Gap: 7 mEq/L (ref 3–11)
BILIRUBIN TOTAL: 0.61 mg/dL (ref 0.20–1.20)
BUN: 18 mg/dL (ref 7.0–26.0)
CHLORIDE: 106 meq/L (ref 98–109)
CO2: 27 meq/L (ref 22–29)
Calcium: 10 mg/dL (ref 8.4–10.4)
Creatinine: 0.9 mg/dL (ref 0.6–1.1)
EGFR: 85 mL/min/{1.73_m2} — AB (ref >90)
Glucose: 102 mg/dl (ref 70–140)
Potassium: 4 mEq/L (ref 3.5–5.1)
SODIUM: 140 meq/L (ref 136–145)
TOTAL PROTEIN: 7.8 g/dL (ref 6.4–8.3)

## 2014-11-28 LAB — CBC WITH DIFFERENTIAL/PLATELET
BASO%: 0.8 % (ref 0.0–2.0)
Basophils Absolute: 0 10*3/uL (ref 0.0–0.1)
EOS%: 2 % (ref 0.0–7.0)
Eosinophils Absolute: 0.1 10*3/uL (ref 0.0–0.5)
HEMATOCRIT: 32 % — AB (ref 34.8–46.6)
HGB: 10.7 g/dL — ABNORMAL LOW (ref 11.6–15.9)
LYMPH#: 1.5 10*3/uL (ref 0.9–3.3)
LYMPH%: 44.5 % (ref 14.0–49.7)
MCH: 31.9 pg (ref 25.1–34.0)
MCHC: 33.3 g/dL (ref 31.5–36.0)
MCV: 95.7 fL (ref 79.5–101.0)
MONO#: 0.3 10*3/uL (ref 0.1–0.9)
MONO%: 9.7 % (ref 0.0–14.0)
NEUT#: 1.5 10*3/uL (ref 1.5–6.5)
NEUT%: 43 % (ref 38.4–76.8)
Platelets: 252 10*3/uL (ref 145–400)
RBC: 3.35 10*6/uL — AB (ref 3.70–5.45)
RDW: 17 % — ABNORMAL HIGH (ref 11.2–14.5)
WBC: 3.4 10*3/uL — ABNORMAL LOW (ref 3.9–10.3)

## 2014-11-28 LAB — CEA: CEA: 256.9 ng/mL — AB (ref 0.0–5.0)

## 2014-11-28 MED ORDER — SODIUM CHLORIDE 0.9 % IJ SOLN
10.0000 mL | INTRAMUSCULAR | Status: DC | PRN
  Administered 2014-11-28: 10 mL via INTRAVENOUS
  Filled 2014-11-28: qty 10

## 2014-11-28 MED ORDER — HEPARIN SOD (PORK) LOCK FLUSH 100 UNIT/ML IV SOLN
500.0000 [IU] | Freq: Once | INTRAVENOUS | Status: AC
  Administered 2014-11-28: 500 [IU] via INTRAVENOUS
  Filled 2014-11-28: qty 5

## 2014-11-28 NOTE — Progress Notes (Signed)
[place] OFFICE PROGRESS NOTE   Diagnosis: Colon cancer  INTERVAL HISTORY:   [name] returns as scheduled. She completed another cycle of Lonsurf beginning on 11/04/2000. No mouth sores, diarrhea, dyspnea, or cough. Good appetite. She has nausea lasting 4-5 days following each course of chemotherapy. The nausea is relieved with Zofran. No emesis.  Objective:  Vital signs in last 24 hours:  Blood pressure 154/89, pulse 79, temperature 98.1 F (36.7 C), temperature source Oral, resp. rate 19, height 5' 11" (1.803 m), weight 269 lb (122.018 kg), SpO2 100 %.    HEENT: No thrush or ulcers Resp: Lungs clear bilaterally Cardio: Regular rate and rhythm GI: No hepatomegaly Vascular: No leg edema  Skin: Palms without erythema, mild hyperpigmentation   Portacath/PICC-without erythema  Lab Results:  Lab Results  Component Value Date   WBC 3.4* 11/28/2014   HGB 10.7* 11/28/2014   HCT 32.0* 11/28/2014   MCV 95.7 11/28/2014   PLT 252 11/28/2014   NEUTROABS 1.5 11/28/2014   10/31/2014-CEA  to 35.8  Medications: I have reviewed the patient's current medications.  Assessment/Plan: 1. Metastatic colon cancer (T4, N0, M1) adenocarcinoma of the sigmoid colon status post sigmoid colectomy and partial right hepatectomy 04/11/2012. Microsatellite stable. No loss of mismatch repair proteins. K-ras wild-type on the initial codon 12 and 13 testing. K-ras Q61L mutation identified on extended testing. BRAF not mutated.  She completed cycle 1 CAPOX beginning 05/25/2012.   She completed cycle 8 CAPOX beginning 10/19/2012.   CEA on 10/19/2012 normal at 0.8.   CEA 6.1 on 01/11/2013   Restaging CT on 01/11/2013 consistent with multiple new pulmonary metastases   Cycle 1 of FOLFIRI/Avastin 01/18/2013; cycle 2 02/01/2013,cycle 3 02/16/2013.   CEA 5.0 on 03/01/2013.   Cycle 4 FOLFIRI/Avastin 03/01/2013.   Cycle 5 FOLFIRI/Avastin 03/15/2013.   Restaging chest CT  03/29/2013 with possible slight decrease in size of a left upper lobe metastasis. Other pulmonary nodules stable.   Cycle 6 FOLFIRI/Avastin 03/29/2013   Cycle 7 of FOLFIRI/Avastin 04/12/2013.   CEA 6.3 05/03/2013.   Cycle 8 FOLFIRI/Avastin 05/03/2013.   Cycle 9 FOLFIRI/Avastin 05/17/2013.   Cycle 10 FOLFIRI/Avastin 05/31/2013.   CEA 10.6 on 06/19/2013.   CT chest 06/19/2013 with mild progression of bilateral pulmonary nodules/metastases.   Decision made to continue FOLFIRI/Avastin. She completed cycle 11 06/21/2013.   Cycle 12 FOLFIRI/Avastin 07/05/2013   Cycle 13 FOLFIRI/Avastin 07/19/2013   Cycle 14 FOLFIRI/Avastin 08/01/2013.   CT chest 08/28/2013 with increased in size of bilateral lung nodules.   PET scan 09/22/2013 with multiple hypermetabolic pulmonary nodules bilaterally. Nodules demonstrate progressive enlargement compared with prior CTs. No evidence of extrathoracic metastatic disease.   Status post SRS 2 lung nodules, completed 10/18/2013  CT 01/12/2014 with airspace disease surrounding treated pulmonary nodules, no new nodules  01/12/2014 CEA 0.9  CT chest 04/12/2014 with improvement in airspace disease surrounding treated pulmonary nodules, no new nodules  04/12/2014 CEA 8.2  07/03/2014 CEA 60.3  CT 07/03/2014 with masslike enlargement of 2 treated lung lesions  Cycle 1 Lonsurf 07/12/2014.  Cycle 2 Lonsurf 08/11/2014 or 08/12/2014  Cycle 3 Lonsurf 09/08/2014  Cycle 4 Lonsurf 10/06/2014  Restaging CT of the chest on 10/01/2014 with stable lung nodules  Cycle 5 Lonsurf 11/05/2014  Cycle 6 Lonsurf 12/03/2014 2. Microcytic anemia, iron deficiency. Improved.  3. Status post Port-A-Cath placement 03/14/2012. X-ray showed the Port-A-Cath tip to be in the azygos vein. The left-sided Port-A-Cath was removed and a new right Port-A-Cath was placed on  05/23/2012. 4. Right subclavian and axillary vein thromboses identified on a duplex study  07/02/2002 -maintained on xarelto. 5. Hypertension-persistent on amlodipine. Lisinopril 5 mg daily added on 03/01/2013. Lisinopril increased to 10 mg daily on 03/15/2013. Increased to 20 mg 07/19/2013. 6. Delayed nausea following cycle 5 FOLFIRI/Avastin. Emend was added to the premedication regimen     Disposition:  Mr. Kofoed appears stable. The plan is to begin another cycle of chemotherapy on 12/03/2014. We will follow-up on the CEA from today. We will plan for a restaging CT evaluation in September.  Betsy Coder, MD  11/28/2014  9:38 AM

## 2014-11-28 NOTE — Progress Notes (Signed)
Radiation Oncology         [phone] ________________________________  Name: [name] MRN: [id number]  Date: 11/28/2014  DOB: 01/19/1956  Follow-Up Visit Note  CC: Linda Coder, MD  Linda Pier, MD  Diagnosis:   [name] is a 59 year old female presenting to clinic in regards to her metastatic colon cancer with pulmonary metastases.  Interval Since Last Radiation:  The patient completed radiation treatment to the lung on 10/18/2013   Narrative:  The patient returns today for routine follow-up. The most current CT Chest results were reviewed with the patient with [name] today (11/28/2014). This was a good result without evidence of progressive disease within the chest. The patient denies symptoms of pain, nausea, pain in the chest, pain in the back, change in appetite or difficulty of breathing. "I feel fine," she stated. The patient projected a healthy mental disposition at today's visit but was not accompanied by any family.                 ALLERGIES:  has No Known Allergies.  Meds: Current Outpatient Prescriptions  Medication Sig Dispense Refill  . amLODipine (NORVASC) 10 MG tablet TAKE 1 TABLET BY MOUTH EVERY DAY 90 tablet 0  . lidocaine-prilocaine (EMLA) cream Apply topically as needed. Apply to [facility] site 1-2 hours prior to stick and cover with plastic wrap to numb site 30 g 3  . loperamide (IMODIUM) 2 MG capsule Take 1 capsule (2 mg total) by mouth as needed for diarrhea or loose stools (max 8 tabs per day). 30 capsule 0  . Multiple Vitamins-Minerals (MULTIVITAMIN) tablet Take 1 tablet by mouth daily.    . ondansetron (ZOFRAN) 8 MG tablet Take 1 tablet (8 mg total) by mouth every 8 (eight) hours as needed for nausea or vomiting. 20 tablet 2  . polyethylene glycol powder (MIRALAX) powder Take 17 g by mouth daily as needed. 255 g 0  . potassium chloride SA (K-DUR,KLOR-CON) 20 MEQ tablet TAKE 1 TABLET BY MOUTH EVERY DAY 90 tablet 0  . prochlorperazine (COMPAZINE)  10 MG tablet TAKE 1 TABLET BY MOUTH EVERY 6 HOURS AS NEEDED FOR NAUSEA OR VOMITING 30 tablet 1  . trifluridine-tipiracil (LONSURF) 15-6.14 MG tablet Take 5 tabs ('75mg'$  total) by mouth 2 times daily. 1 hr after AM & PM meals on day 1-5, 8-12.  Repeat every 28 days 100 tablet 0  . XARELTO 20 MG TABS tablet TAKE 1 TABLET BY MOUTH EVERY DAY 30 tablet 0   No current facility-administered medications for this encounter.    Physical Findings: The patient is in no acute distress and the patient is alert and oriented x3.  height is '5\' 11"'$  (1.803 m) and weight is 269 lb 12.8 oz (122.38 kg). Her oral temperature is 97.8 F (36.6 C). Her blood pressure is 129/82 and her pulse is 77. Her respiration is 20 and oxygen saturation is 100%. .   General: Well-developed, in no acute distress HEENT: Normocephalic, atraumatic Cardiovascular: Regular rate and rhythm Respiratory: Clear to auscultation bilaterally GI: Soft, nontender, normal bowel sounds Extremities: No edema present   Lab Findings: Lab Results  Component Value Date   WBC 3.4* 11/28/2014   HGB 10.7* 11/28/2014   HCT 32.0* 11/28/2014   MCV 95.7 11/28/2014   PLT 252 11/28/2014     Radiographic Findings: No results found.  Impression: The patient clinically is doing well at this time. [name] is a 59 year old female presenting to clinic in  regards to her metastatic colon cancer with pulmonary metastases.  Plan:  The patient has been advised of her followup in 4 months to be scheduled. All questions and concerns vocalized by the patient were addressed. If the patient develops any further questions or concerns, she has been advised to contact Dr. Lisbeth Renshaw, MD or Dr. Lonia Skinner, MD.  I spent 10 minutes with the patient today, the majority of which was spent counseling the patient on the diagnosis of cancer and coordinating care.   This document serves as a record of services personally performed by Linda Rudd, MD. It was created on his  behalf by [name], a trained medical scribe. The creation of this record is based on the scribe's personal observations and the provider's statements to them. This document has been checked and approved by the attending provider.  ------------------------------------------------  Linda Gross, MD, PhD

## 2014-11-28 NOTE — Progress Notes (Signed)
Follow up s/o SBRT completed  Lung,  No c/o pain or sob, no nauea, except 4 days after taking Lonsurf oral med gets nauseated,  Las t CT Chest reulsts gone over with Dr. Benay Spice today , no thing new stated patient  10:44 AM BP 129/82 mmHg  Pulse 77  Temp(Src) 97.8 F (36.6 C) (Oral)  Resp 20  Ht '5\' 11"'$  (1.803 m)  Wt 269 lb 12.8 oz (122.38 kg)  BMI 37.65 kg/m2  SpO2 100%  Wt Readings from Last 3 Encounters:  11/28/14 269 lb 12.8 oz (122.38 kg)  11/28/14 269 lb (122.018 kg)  10/31/14 269 lb 6.4 oz (122.199 kg)

## 2014-11-28 NOTE — Telephone Encounter (Signed)
per pof to sch pt appt-gave pt copy of avs °

## 2014-11-28 NOTE — Patient Instructions (Signed)

## 2014-11-29 ENCOUNTER — Other Ambulatory Visit: Payer: Self-pay | Admitting: Nurse Practitioner

## 2014-12-03 ENCOUNTER — Telehealth: Payer: Self-pay | Admitting: *Deleted

## 2014-12-03 DIAGNOSIS — C787 Secondary malignant neoplasm of liver and intrahepatic bile duct: Principal | ICD-10-CM

## 2014-12-03 DIAGNOSIS — C189 Malignant neoplasm of colon, unspecified: Secondary | ICD-10-CM

## 2014-12-03 MED ORDER — TRIFLURIDINE-TIPIRACIL 15-6.14 MG PO TABS: ORAL_TABLET | ORAL | Status: DC

## 2014-12-03 NOTE — Telephone Encounter (Signed)
VM message received from patient requesting refill on Lonsurf.   Refilled called in to [facility].  TC back to pt to inform her of refill.

## 2014-12-13 ENCOUNTER — Other Ambulatory Visit: Payer: Self-pay | Admitting: Nurse Practitioner

## 2014-12-13 ENCOUNTER — Other Ambulatory Visit: Payer: Self-pay | Admitting: Oncology

## 2014-12-14 ENCOUNTER — Ambulatory Visit (HOSPITAL_BASED_OUTPATIENT_CLINIC_OR_DEPARTMENT_OTHER): Payer: Medicare Other | Admitting: Oncology

## 2014-12-14 ENCOUNTER — Ambulatory Visit: Payer: Medicare Other

## 2014-12-14 ENCOUNTER — Other Ambulatory Visit (HOSPITAL_BASED_OUTPATIENT_CLINIC_OR_DEPARTMENT_OTHER): Payer: Medicare Other

## 2014-12-14 VITALS — BP 161/85 | HR 100 | Temp 98.7°F | Resp 18 | Ht 71.0 in | Wt 269.5 lb

## 2014-12-14 DIAGNOSIS — Z95828 Presence of other vascular implants and grafts: Secondary | ICD-10-CM

## 2014-12-14 DIAGNOSIS — C187 Malignant neoplasm of sigmoid colon: Secondary | ICD-10-CM

## 2014-12-14 DIAGNOSIS — C7801 Secondary malignant neoplasm of right lung: Secondary | ICD-10-CM | POA: Diagnosis not present

## 2014-12-14 DIAGNOSIS — C787 Secondary malignant neoplasm of liver and intrahepatic bile duct: Secondary | ICD-10-CM

## 2014-12-14 DIAGNOSIS — Z86718 Personal history of other venous thrombosis and embolism: Secondary | ICD-10-CM | POA: Diagnosis not present

## 2014-12-14 DIAGNOSIS — D509 Iron deficiency anemia, unspecified: Secondary | ICD-10-CM

## 2014-12-14 DIAGNOSIS — C189 Malignant neoplasm of colon, unspecified: Secondary | ICD-10-CM

## 2014-12-14 DIAGNOSIS — C7802 Secondary malignant neoplasm of left lung: Secondary | ICD-10-CM

## 2014-12-14 DIAGNOSIS — C7800 Secondary malignant neoplasm of unspecified lung: Secondary | ICD-10-CM

## 2014-12-14 LAB — CBC WITH DIFFERENTIAL/PLATELET
BASO%: 0.2 % (ref 0.0–2.0)
Basophils Absolute: 0 10*3/uL (ref 0.0–0.1)
EOS%: 0.2 % (ref 0.0–7.0)
Eosinophils Absolute: 0 10*3/uL (ref 0.0–0.5)
HCT: 32.7 % — ABNORMAL LOW (ref 34.8–46.6)
HGB: 11 g/dL — ABNORMAL LOW (ref 11.6–15.9)
LYMPH%: 22.2 % (ref 14.0–49.7)
MCH: 31.6 pg (ref 25.1–34.0)
MCHC: 33.6 g/dL (ref 31.5–36.0)
MCV: 94 fL (ref 79.5–101.0)
MONO#: 0.2 10*3/uL (ref 0.1–0.9)
MONO%: 4.1 % (ref 0.0–14.0)
NEUT#: 4.3 10*3/uL (ref 1.5–6.5)
NEUT%: 73.3 % (ref 38.4–76.8)
NRBC: 0 % (ref 0–0)
PLATELETS: 222 10*3/uL (ref 145–400)
RBC: 3.48 10*6/uL — AB (ref 3.70–5.45)
RDW: 15.5 % — AB (ref 11.2–14.5)
WBC: 5.9 10*3/uL (ref 3.9–10.3)
lymph#: 1.3 10*3/uL (ref 0.9–3.3)

## 2014-12-14 MED ORDER — SODIUM CHLORIDE 0.9 % IJ SOLN
10.0000 mL | INTRAMUSCULAR | Status: DC | PRN
  Filled 2014-12-14: qty 10

## 2014-12-14 MED ORDER — LOPERAMIDE HCL 2 MG PO CAPS: 2.0000 mg | ORAL_CAPSULE | ORAL | Status: DC | PRN

## 2014-12-14 MED ORDER — HEPARIN SOD (PORK) LOCK FLUSH 100 UNIT/ML IV SOLN
500.0000 [IU] | Freq: Once | INTRAVENOUS | Status: DC
  Filled 2014-12-14: qty 5

## 2014-12-14 MED ORDER — LORAZEPAM 0.5 MG PO TABS: 0.5000 mg | ORAL_TABLET | Freq: Four times a day (QID) | ORAL | Status: DC | PRN

## 2014-12-14 NOTE — Progress Notes (Signed)
Patient in for labs and PAC flush today. Patient had PAC flushed on 11/28/14. Patient agreed to have all labs drawn by the Phlebotomist today. All labs drawn by [place], Phlebotomist. Patient has appointment for [facility] flush on 12/26/2014.

## 2014-12-14 NOTE — Progress Notes (Signed)
[name] OFFICE PROGRESS NOTE   Diagnosis: Colon cancer  INTERVAL HISTORY:   [name] returns as scheduled. She began another cycle of Lonsurf 12/03/2014 and will complete this cycle today. She complains of nausea following chemotherapy that has persisted. No diarrhea, dyspnea, or mouth sores. Zofran and Compazine have not helped the nausea.  Objective:  Vital signs in last 24 hours:  Blood pressure 161/85, pulse 100, temperature 98.7 F (37.1 C), temperature source Oral, resp. rate 18, height $RemoveBe'5\' 11"'gEaYQOThQ$  (1.803 m), weight 269 lb 8 oz (122.244 kg), SpO2 100 %.    HEENT: No thrush or ulcers Resp: Lungs clear bilaterally Cardio: Regular rate and rhythm GI: Nontender, no hepatomegaly, no mass Vascular: No leg edema  Lab Results:  Lab Results  Component Value Date   WBC 5.9 12/14/2014   HGB 11.0* 12/14/2014   HCT 32.7* 12/14/2014   MCV 94.0 12/14/2014   PLT 222 12/14/2014   NEUTROABS 4.3 12/14/2014      Lab Results  Component Value Date   CEA 256.9* 11/28/2014    Medications: I have reviewed the patient's current medications.  Assessment/Plan: 1. Metastatic colon cancer (T4, N0, M1) adenocarcinoma of the sigmoid colon status post sigmoid colectomy and partial right hepatectomy 04/11/2012. Microsatellite stable. No loss of mismatch repair proteins. K-ras wild-type on the initial codon 12 and 13 testing. K-ras Q61L mutation identified on extended testing. BRAF not mutated.  She completed cycle 1 CAPOX beginning 05/25/2012.   She completed cycle 8 CAPOX beginning 10/19/2012.   CEA on 10/19/2012 normal at 0.8.   CEA 6.1 on 01/11/2013   Restaging CT on 01/11/2013 consistent with multiple new pulmonary metastases   Cycle 1 of FOLFIRI/Avastin 01/18/2013; cycle 2 02/01/2013,cycle 3 02/16/2013.   CEA 5.0 on 03/01/2013.   Cycle 4 FOLFIRI/Avastin 03/01/2013.   Cycle 5 FOLFIRI/Avastin 03/15/2013.   Restaging chest CT 03/29/2013 with possible  slight decrease in size of a left upper lobe metastasis. Other pulmonary nodules stable.   Cycle 6 FOLFIRI/Avastin 03/29/2013   Cycle 7 of FOLFIRI/Avastin 04/12/2013.   CEA 6.3 05/03/2013.   Cycle 8 FOLFIRI/Avastin 05/03/2013.   Cycle 9 FOLFIRI/Avastin 05/17/2013.   Cycle 10 FOLFIRI/Avastin 05/31/2013.   CEA 10.6 on 06/19/2013.   CT chest 06/19/2013 with mild progression of bilateral pulmonary nodules/metastases.   Decision made to continue FOLFIRI/Avastin. She completed cycle 11 06/21/2013.   Cycle 12 FOLFIRI/Avastin 07/05/2013   Cycle 13 FOLFIRI/Avastin 07/19/2013   Cycle 14 FOLFIRI/Avastin 08/01/2013.   CT chest 08/28/2013 with increased in size of bilateral lung nodules.   PET scan 09/22/2013 with multiple hypermetabolic pulmonary nodules bilaterally. Nodules demonstrate progressive enlargement compared with prior CTs. No evidence of extrathoracic metastatic disease.   Status post SRS 2 lung nodules, completed 10/18/2013  CT 01/12/2014 with airspace disease surrounding treated pulmonary nodules, no new nodules  01/12/2014 CEA 0.9  CT chest 04/12/2014 with improvement in airspace disease surrounding treated pulmonary nodules, no new nodules  04/12/2014 CEA 8.2  07/03/2014 CEA 60.3  CT 07/03/2014 with masslike enlargement of 2 treated lung lesions  Cycle 1 Lonsurf 07/12/2014.  Cycle 2 Lonsurf 08/11/2014 or 08/12/2014  Cycle 3 Lonsurf 09/08/2014  Cycle 4 Lonsurf 10/06/2014  Restaging CT of the chest on 10/01/2014 with stable lung nodules  Cycle 5 Lonsurf 11/05/2014  Cycle 6 Lonsurf 12/03/2014 2. Microcytic anemia, iron deficiency. Improved.  3. Status post Port-A-Cath placement 03/14/2012. X-ray showed the Port-A-Cath tip to be in the azygos vein. The left-sided Port-A-Cath was removed and a new  right Port-A-Cath was placed on 05/23/2012. 4. 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 5. Hypertension-persistent on amlodipine. Lisinopril 5 mg daily added on 03/01/2013. Lisinopril increased to 10 mg daily on 03/15/2013. Increased to 20 mg 07/19/2013. 6. Delayed nausea following cycle 5 FOLFIRI/Avastin. Emend was added to the premedication regimen 7. Nausea secondary to Lonsurf   Disposition:  [name] has nausea with the [place]. Zofran and Compazine have not helped. She will complete the current cycle of chemotherapy today. We added Ativan to use as needed for nausea. [name] will return for an office visit and restaging CT evaluation on 12/26/2014.  Betsy Coder, MD  12/14/2014  10:32 AM

## 2014-12-26 ENCOUNTER — Other Ambulatory Visit (HOSPITAL_BASED_OUTPATIENT_CLINIC_OR_DEPARTMENT_OTHER): Payer: Medicare Other

## 2014-12-26 ENCOUNTER — Ambulatory Visit (HOSPITAL_BASED_OUTPATIENT_CLINIC_OR_DEPARTMENT_OTHER): Payer: Medicare Other | Admitting: Oncology

## 2014-12-26 ENCOUNTER — Telehealth: Payer: Self-pay | Admitting: Oncology

## 2014-12-26 ENCOUNTER — Ambulatory Visit (HOSPITAL_COMMUNITY)
Admission: RE | Admit: 2014-12-26 | Discharge: 2014-12-26 | Disposition: A | Discharge status: Home / Self Care | Payer: Medicare Other | Source: Ambulatory Visit | Attending: Oncology | Admitting: Oncology

## 2014-12-26 ENCOUNTER — Encounter (HOSPITAL_COMMUNITY): Payer: Self-pay

## 2014-12-26 ENCOUNTER — Other Ambulatory Visit: Payer: Medicare Other

## 2014-12-26 ENCOUNTER — Ambulatory Visit (HOSPITAL_BASED_OUTPATIENT_CLINIC_OR_DEPARTMENT_OTHER): Payer: Medicare Other

## 2014-12-26 ENCOUNTER — Encounter: Payer: Self-pay | Admitting: Oncology

## 2014-12-26 VITALS — BP 172/90 | HR 84 | Temp 98.6°F | Resp 18 | Ht 71.0 in | Wt 268.9 lb

## 2014-12-26 DIAGNOSIS — C7801 Secondary malignant neoplasm of right lung: Secondary | ICD-10-CM | POA: Diagnosis not present

## 2014-12-26 DIAGNOSIS — K760 Fatty (change of) liver, not elsewhere classified: Secondary | ICD-10-CM | POA: Diagnosis not present

## 2014-12-26 DIAGNOSIS — C7802 Secondary malignant neoplasm of left lung: Secondary | ICD-10-CM

## 2014-12-26 DIAGNOSIS — Z86718 Personal history of other venous thrombosis and embolism: Secondary | ICD-10-CM

## 2014-12-26 DIAGNOSIS — D509 Iron deficiency anemia, unspecified: Secondary | ICD-10-CM

## 2014-12-26 DIAGNOSIS — I288 Other diseases of pulmonary vessels: Secondary | ICD-10-CM | POA: Insufficient documentation

## 2014-12-26 DIAGNOSIS — C187 Malignant neoplasm of sigmoid colon: Secondary | ICD-10-CM

## 2014-12-26 DIAGNOSIS — Z452 Encounter for adjustment and management of vascular access device: Secondary | ICD-10-CM

## 2014-12-26 DIAGNOSIS — C787 Secondary malignant neoplasm of liver and intrahepatic bile duct: Secondary | ICD-10-CM | POA: Insufficient documentation

## 2014-12-26 DIAGNOSIS — C189 Malignant neoplasm of colon, unspecified: Secondary | ICD-10-CM | POA: Diagnosis present

## 2014-12-26 DIAGNOSIS — I10 Essential (primary) hypertension: Secondary | ICD-10-CM | POA: Diagnosis not present

## 2014-12-26 DIAGNOSIS — Z08 Encounter for follow-up examination after completed treatment for malignant neoplasm: Secondary | ICD-10-CM | POA: Diagnosis present

## 2014-12-26 DIAGNOSIS — C7800 Secondary malignant neoplasm of unspecified lung: Secondary | ICD-10-CM

## 2014-12-26 DIAGNOSIS — Z95828 Presence of other vascular implants and grafts: Secondary | ICD-10-CM

## 2014-12-26 LAB — COMPREHENSIVE METABOLIC PANEL (CC13)
ALBUMIN: 4.2 g/dL (ref 3.5–5.0)
ALK PHOS: 87 U/L (ref 40–150)
ALT: 36 U/L (ref 0–55)
AST: 35 U/L — AB (ref 5–34)
Anion Gap: 10 mEq/L (ref 3–11)
BILIRUBIN TOTAL: 0.67 mg/dL (ref 0.20–1.20)
BUN: 12.5 mg/dL (ref 7.0–26.0)
CALCIUM: 10.2 mg/dL (ref 8.4–10.4)
CO2: 25 mEq/L (ref 22–29)
CREATININE: 0.9 mg/dL (ref 0.6–1.1)
Chloride: 104 mEq/L (ref 98–109)
EGFR: 84 mL/min/{1.73_m2} — ABNORMAL LOW (ref >90)
Glucose: 109 mg/dl (ref 70–140)
POTASSIUM: 3.6 meq/L (ref 3.5–5.1)
Sodium: 139 mEq/L (ref 136–145)
TOTAL PROTEIN: 8 g/dL (ref 6.4–8.3)

## 2014-12-26 LAB — CBC WITH DIFFERENTIAL/PLATELET
BASO%: 0.8 % (ref 0.0–2.0)
BASOS ABS: 0 10*3/uL (ref 0.0–0.1)
EOS%: 1.8 % (ref 0.0–7.0)
Eosinophils Absolute: 0.1 10*3/uL (ref 0.0–0.5)
HEMATOCRIT: 33.2 % — AB (ref 34.8–46.6)
HEMOGLOBIN: 11 g/dL — AB (ref 11.6–15.9)
LYMPH#: 1.6 10*3/uL (ref 0.9–3.3)
LYMPH%: 30.1 % (ref 14.0–49.7)
MCH: 31.7 pg (ref 25.1–34.0)
MCHC: 33 g/dL (ref 31.5–36.0)
MCV: 96 fL (ref 79.5–101.0)
MONO#: 0.4 10*3/uL (ref 0.1–0.9)
MONO%: 7.8 % (ref 0.0–14.0)
NEUT#: 3.2 10*3/uL (ref 1.5–6.5)
NEUT%: 59.5 % (ref 38.4–76.8)
Platelets: 292 10*3/uL (ref 145–400)
RBC: 3.46 10*6/uL — ABNORMAL LOW (ref 3.70–5.45)
RDW: 15.9 % — AB (ref 11.2–14.5)
WBC: 5.3 10*3/uL (ref 3.9–10.3)

## 2014-12-26 MED ORDER — SODIUM CHLORIDE 0.9 % IJ SOLN
10.0000 mL | INTRAMUSCULAR | Status: DC | PRN
  Administered 2014-12-26: 10 mL via INTRAVENOUS
  Filled 2014-12-26: qty 10

## 2014-12-26 MED ORDER — IOHEXOL 300 MG/ML  SOLN
100.0000 mL | Freq: Once | INTRAMUSCULAR | Status: AC | PRN
  Administered 2014-12-26: 100 mL via INTRAVENOUS

## 2014-12-26 MED ORDER — TRIFLURIDINE-TIPIRACIL 15-6.14 MG PO TABS: ORAL_TABLET | ORAL | Status: DC

## 2014-12-26 MED ORDER — HEPARIN SOD (PORK) LOCK FLUSH 100 UNIT/ML IV SOLN
500.0000 [IU] | Freq: Once | INTRAVENOUS | Status: DC
  Filled 2014-12-26: qty 5

## 2014-12-26 MED ORDER — IOHEXOL 300 MG/ML  SOLN
50.0000 mL | Freq: Once | INTRAMUSCULAR | Status: AC | PRN
  Administered 2014-12-26: 50 mL via ORAL

## 2014-12-26 MED ORDER — HYDROCHLOROTHIAZIDE 12.5 MG PO CAPS: 12.5000 mg | ORAL_CAPSULE | Freq: Every day | ORAL | Status: DC

## 2014-12-26 NOTE — Patient Instructions (Signed)

## 2014-12-26 NOTE — Progress Notes (Signed)
I sent to medical records, notice from [employer] that lorazepam was approved for a 30day supply

## 2014-12-26 NOTE — Telephone Encounter (Signed)
Gave adn printed appt sched and avs for pt for Sept °

## 2014-12-26 NOTE — Progress Notes (Signed)
[facility] OFFICE PROGRESS NOTE    Diagnosis: Colon cancer  INTERVAL HISTORY:   Ms. Hajjar returns as scheduled. She reports less nausea with the most recent Lonsurf after taking Ativan. No complaint. Good appetite and energy level.  Objective:  Vital signs in last 24 hours:  Blood pressure 172/90, pulse 84, temperature 98.6 F (37 C), temperature source Oral, resp. rate 18, height 5\' 11"  (1.803 m), weight 268 lb 14.4 oz (121.972 kg), SpO2 97 %.    HEENT: No thrush or ulcers Lymphatics: No cervical, supraclavicular, axillary, or inguinal nodes Resp: Lungs clear bilaterally Cardio: Regular rate and rhythm GI: No hepatomegaly, nontender Vascular: No leg edema   Portacath/PICC-without erythema  Lab Results:  Lab Results  Component Value Date   WBC 5.3 12/26/2014   HGB 11.0* 12/26/2014   HCT 33.2* 12/26/2014   MCV 96.0 12/26/2014   PLT 292 12/26/2014   NEUTROABS 3.2 12/26/2014    Lab Results  Component Value Date   NA 139 12/26/2014    Lab Results  Component Value Date   CEA 256.9* 11/28/2014    Imaging:  Ct Chest W Contrast  12/26/2014   CLINICAL DATA:  Colon cancer with liver metastases, chemotherapy complete.  EXAM: CT CHEST, ABDOMEN, AND PELVIS WITH CONTRAST  TECHNIQUE: Multidetector CT imaging of the chest, abdomen and pelvis was performed following the standard protocol during bolus administration of intravenous contrast.  CONTRAST:  19mL OMNIPAQUE IOHEXOL 300 MG/ML SOLN, 45m OMNIPAQUE IOHEXOL 300 MG/ML SOLN  COMPARISON:  CT chest 10/01/2014 and CT chest abdomen pelvis 07/03/2014.  FINDINGS: CT CHEST FINDINGS  Mediastinum/Nodes: Low-attenuation lesions in the thyroid measure up to 2.0 cm on the left. No pathologically enlarged mediastinal, hilar or axillary lymph nodes. Prevascular nodular soft tissue is unchanged. Right IJ Port-A-Cath terminates in the high right atrium. Pulmonary arteries are enlarged. Heart size normal. No pericardial  effusion.  Lungs/Pleura: Platelike scarring in the right upper lobe. A mass in the right middle lobe measures 3.1 x 3.3 cm, stable. Left upper lobe mass measures 3.9 x 4.1 cm, likely stable when remeasured. Nodular scarring in the left lower lobe. No pleural fluid. Airway is unremarkable.  Musculoskeletal: No worrisome lytic or sclerotic lesions.  CT ABDOMEN AND PELVIS FINDINGS  Hepatobiliary: The liver appears slightly decreased in attenuation diffusely. 7 mm low-attenuation lesion in the right hepatic lobe (series 2, image 56), stable. Postoperative changes in the right hepatic lobe. Cholecystectomy. No biliary ductal dilatation.  Pancreas: Negative.  Spleen: Negative.  Adrenals/Urinary Tract: Adrenal glands are unremarkable. 7 mm low-attenuation lesion in the right kidney is too small to characterize but a cyst is most likely statistically. Ureters are decompressed. Bladder is low in volume.  Stomach/Bowel: Stomach and small bowel are unremarkable. Question right hemicolectomy. Anastomosis is seen in the sigmoid. Colon is otherwise unremarkable.  Vascular/Lymphatic: Scattered atherosclerotic calcification of the arterial vasculature. Retroaortic left renal vein. No pathologically enlarged lymph nodes.  Reproductive: Hysterectomy.  Ovaries are not well-visualized.  Other: No free fluid. Midline ventral hernia contains fat and unobstructed bowel. Mesenteries and peritoneum are otherwise unremarkable.  Musculoskeletal: No worrisome lytic or sclerotic lesions.  IMPRESSION: 1. Right middle and left upper lobe masses are stable. 2. Enlarged pulmonary arteries, indicative of pulmonary arterial hypertension. 3. Hepatic steatosis.   Electronically Signed   By: 09/02/2014 M.D.   On: 12/26/2014 13:20   Ct Abdomen Pelvis W Contrast  12/26/2014   CLINICAL DATA:  Colon cancer with liver metastases, chemotherapy complete.  EXAM: CT CHEST, ABDOMEN, AND PELVIS WITH CONTRAST  TECHNIQUE: Multidetector CT imaging of the  chest, abdomen and pelvis was performed following the standard protocol during bolus administration of intravenous contrast.  CONTRAST:  44mL OMNIPAQUE IOHEXOL 300 MG/ML SOLN, 115mL OMNIPAQUE IOHEXOL 300 MG/ML SOLN  COMPARISON:  CT chest 10/01/2014 and CT chest abdomen pelvis 07/03/2014.  FINDINGS: CT CHEST FINDINGS  Mediastinum/Nodes: Low-attenuation lesions in the thyroid measure up to 2.0 cm on the left. No pathologically enlarged mediastinal, hilar or axillary lymph nodes. Prevascular nodular soft tissue is unchanged. Right IJ Port-A-Cath terminates in the high right atrium. Pulmonary arteries are enlarged. Heart size normal. No pericardial effusion.  Lungs/Pleura: Platelike scarring in the right upper lobe. A mass in the right middle lobe measures 3.1 x 3.3 cm, stable. Left upper lobe mass measures 3.9 x 4.1 cm, likely stable when remeasured. Nodular scarring in the left lower lobe. No pleural fluid. Airway is unremarkable.  Musculoskeletal: No worrisome lytic or sclerotic lesions.  CT ABDOMEN AND PELVIS FINDINGS  Hepatobiliary: The liver appears slightly decreased in attenuation diffusely. 7 mm low-attenuation lesion in the right hepatic lobe (series 2, image 56), stable. Postoperative changes in the right hepatic lobe. Cholecystectomy. No biliary ductal dilatation.  Pancreas: Negative.  Spleen: Negative.  Adrenals/Urinary Tract: Adrenal glands are unremarkable. 7 mm low-attenuation lesion in the right kidney is too small to characterize but a cyst is most likely statistically. Ureters are decompressed. Bladder is low in volume.  Stomach/Bowel: Stomach and small bowel are unremarkable. Question right hemicolectomy. Anastomosis is seen in the sigmoid. Colon is otherwise unremarkable.  Vascular/Lymphatic: Scattered atherosclerotic calcification of the arterial vasculature. Retroaortic left renal vein. No pathologically enlarged lymph nodes.  Reproductive: Hysterectomy.  Ovaries are not well-visualized.  Other:  No free fluid. Midline ventral hernia contains fat and unobstructed bowel. Mesenteries and peritoneum are otherwise unremarkable.  Musculoskeletal: No worrisome lytic or sclerotic lesions.  IMPRESSION: 1. Right middle and left upper lobe masses are stable. 2. Enlarged pulmonary arteries, indicative of pulmonary arterial hypertension. 3. Hepatic steatosis.   Electronically Signed   By: Lorin Picket M.D.   On: 12/26/2014 13:20    Medications: I have reviewed the patient's current medications.  Assessment/Plan: 1. Metastatic colon cancer (T4, N0, M1) adenocarcinoma of the sigmoid colon status post sigmoid colectomy and partial right hepatectomy 04/11/2012. Microsatellite stable. No loss of mismatch repair proteins. K-ras wild-type on the initial codon 12 and 13 testing. K-ras Q61L mutation identified on extended testing. BRAF not mutated.  She completed cycle 1 CAPOX beginning 05/25/2012.   She completed cycle 8 CAPOX beginning 10/19/2012.   CEA on 10/19/2012 normal at 0.8.   CEA 6.1 on 01/11/2013   Restaging CT on 01/11/2013 consistent with multiple new pulmonary metastases   Cycle 1 of FOLFIRI/Avastin 01/18/2013; cycle 2 02/01/2013,cycle 3 02/16/2013.   CEA 5.0 on 03/01/2013.   Cycle 4 FOLFIRI/Avastin 03/01/2013.   Cycle 5 FOLFIRI/Avastin 03/15/2013.   Restaging chest CT 03/29/2013 with possible slight decrease in size of a left upper lobe metastasis. Other pulmonary nodules stable.   Cycle 6 FOLFIRI/Avastin 03/29/2013   Cycle 7 of FOLFIRI/Avastin 04/12/2013.   CEA 6.3 05/03/2013.   Cycle 8 FOLFIRI/Avastin 05/03/2013.   Cycle 9 FOLFIRI/Avastin 05/17/2013.   Cycle 10 FOLFIRI/Avastin 05/31/2013.   CEA 10.6 on 06/19/2013.   CT chest 06/19/2013 with mild progression of bilateral pulmonary nodules/metastases.   Decision made to continue FOLFIRI/Avastin. She completed cycle 11 06/21/2013.   Cycle 12 FOLFIRI/Avastin 07/05/2013   Cycle 13  FOLFIRI/Avastin  07/19/2013   Cycle 14 FOLFIRI/Avastin 08/01/2013.   CT chest 08/28/2013 with increased in size of bilateral lung nodules.   PET scan 09/22/2013 with multiple hypermetabolic pulmonary nodules bilaterally. Nodules demonstrate progressive enlargement compared with prior CTs. No evidence of extrathoracic metastatic disease.   Status post SRS 2 lung nodules, completed 10/18/2013  CT 01/12/2014 with airspace disease surrounding treated pulmonary nodules, no new nodules  01/12/2014 CEA 0.9  CT chest 04/12/2014 with improvement in airspace disease surrounding treated pulmonary nodules, no new nodules  04/12/2014 CEA 8.2  07/03/2014 CEA 60.3  CT 07/03/2014 with masslike enlargement of 2 treated lung lesions  Cycle 1 Lonsurf 07/12/2014.  Cycle 2 Lonsurf 08/11/2014 or 08/12/2014  Cycle 3 Lonsurf 09/08/2014  Cycle 4 Lonsurf 10/06/2014  Restaging CT of the chest on 10/01/2014 with stable lung nodules  Cycle 5 Lonsurf 11/05/2014  Cycle 6 Lonsurf 12/03/2014  Restaging CTs of the chest, abdomen, and pelvis on 12/26/2014 with no evidence of disease progression, stable lung nodules  Cycle 7 Lonsurf 12/31/2014 2. Microcytic anemia, iron deficiency. Improved.  3. Status post Port-A-Cath placement 03/14/2012. X-ray showed the Port-A-Cath tip to be in the azygos vein. The left-sided Port-A-Cath was removed and a new right Port-A-Cath was placed on 05/23/2012. 4. 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 5. Hypertension-persistent on amlodipine. HCTZ added 12/26/2014 6. Delayed nausea following cycle 5 FOLFIRI/Avastin. Emend was added to the premedication regimen 7. Nausea secondary to Lonsurf-improved with Ativan  Disposition:  [name] appears well. The restaging CT reveals no evidence of disease progression. She will begin another cycle of Lonsurf on 12/31/2014. She will return for an office visit 01/17/2015. She has  persistent hypertension. We added HCTZ today.  Betsy Coder, MD  12/26/2014  1:39 PM

## 2014-12-27 LAB — CEA: CEA: 423.8 ng/mL — AB (ref 0.0–5.0)

## 2015-01-13 ENCOUNTER — Other Ambulatory Visit: Payer: Self-pay | Admitting: Oncology

## 2015-01-17 ENCOUNTER — Other Ambulatory Visit: Payer: Self-pay | Admitting: *Deleted

## 2015-01-17 ENCOUNTER — Telehealth: Payer: Self-pay | Admitting: Oncology

## 2015-01-17 ENCOUNTER — Telehealth: Payer: Self-pay | Admitting: *Deleted

## 2015-01-17 ENCOUNTER — Ambulatory Visit (HOSPITAL_BASED_OUTPATIENT_CLINIC_OR_DEPARTMENT_OTHER): Payer: Medicare Other | Admitting: Oncology

## 2015-01-17 VITALS — BP 143/81 | HR 80 | Temp 98.4°F | Resp 18 | Ht 71.0 in | Wt 266.7 lb

## 2015-01-17 DIAGNOSIS — C189 Malignant neoplasm of colon, unspecified: Secondary | ICD-10-CM

## 2015-01-17 DIAGNOSIS — C787 Secondary malignant neoplasm of liver and intrahepatic bile duct: Secondary | ICD-10-CM | POA: Diagnosis not present

## 2015-01-17 MED ORDER — TRIFLURIDINE-TIPIRACIL 15-6.14 MG PO TABS: ORAL_TABLET | ORAL | Status: DC

## 2015-01-17 MED ORDER — LORAZEPAM 0.5 MG PO TABS: 0.5000 mg | ORAL_TABLET | Freq: Four times a day (QID) | ORAL | Status: DC | PRN

## 2015-01-17 NOTE — Telephone Encounter (Signed)
Patient called requesting refill on Lorazepam be sent to [facility].

## 2015-01-17 NOTE — Telephone Encounter (Signed)
per pof to sch pt appt-gave pt copy of avs °

## 2015-01-17 NOTE — Addendum Note (Signed)
Addended by: Domenic Schwab on: 01/17/2015 11:13 AM   Modules accepted: Orders, Medications

## 2015-01-17 NOTE — Telephone Encounter (Signed)
Notified pt per request that Ativan was called in to [facility].  Pt verbalized understanding and expressed appreciation for call.

## 2015-01-17 NOTE — Progress Notes (Signed)
[facility] OFFICE PROGRESS NOTE   Diagnosis: Colon cancer  INTERVAL HISTORY:   [name] returns as scheduled. She did another cycle of Lonsurf beginning 12/31/2014. She reports tolerating the chemotherapy well. Nausea has improved since she was prescribed Ativan. No diarrhea. She has persistent numbness in the feet.  Objective:  Vital signs in last 24 hours:  Blood pressure 143/81, pulse 80, temperature 98.4 F (36.9 C), temperature source Oral, resp. rate 18, height $RemoveBe'5\' 11"'nJWKtRVHa$  (1.803 m), weight 266 lb 11.2 oz (120.974 kg), SpO2 100 %.    HEENT: No thrush or ulcers Resp: Lungs clear bilaterally Cardio: Regular rate and rhythm GI: No hepatomegaly, nontender Vascular: No leg edema  Skin: Hyperpigmentation of the palms   Portacath/PICC-without erythema  Lab Results:  Lab Results  Component Value Date   WBC 5.3 12/26/2014   HGB 11.0* 12/26/2014   HCT 33.2* 12/26/2014   MCV 96.0 12/26/2014   PLT 292 12/26/2014   NEUTROABS 3.2 12/26/2014     Lab Results  Component Value Date   CEA 423.8* 12/26/2014    Medications: I have reviewed the patient's current medications.  Assessment/Plan: 1. Metastatic colon cancer (T4, N0, M1) adenocarcinoma of the sigmoid colon status post sigmoid colectomy and partial right hepatectomy 04/11/2012. Microsatellite stable. No loss of mismatch repair proteins. K-ras wild-type on the initial codon 12 and 13 testing. K-ras Q61L mutation identified on extended testing. BRAF not mutated.  She completed cycle 1 CAPOX beginning 05/25/2012.   She completed cycle 8 CAPOX beginning 10/19/2012.   CEA on 10/19/2012 normal at 0.8.   CEA 6.1 on 01/11/2013   Restaging CT on 01/11/2013 consistent with multiple new pulmonary metastases   Cycle 1 of FOLFIRI/Avastin 01/18/2013; cycle 2 02/01/2013,cycle 3 02/16/2013.   CEA 5.0 on 03/01/2013.   Cycle 4 FOLFIRI/Avastin 03/01/2013.   Cycle 5 FOLFIRI/Avastin 03/15/2013.    Restaging chest CT 03/29/2013 with possible slight decrease in size of a left upper lobe metastasis. Other pulmonary nodules stable.   Cycle 6 FOLFIRI/Avastin 03/29/2013   Cycle 7 of FOLFIRI/Avastin 04/12/2013.   CEA 6.3 05/03/2013.   Cycle 8 FOLFIRI/Avastin 05/03/2013.   Cycle 9 FOLFIRI/Avastin 05/17/2013.   Cycle 10 FOLFIRI/Avastin 05/31/2013.   CEA 10.6 on 06/19/2013.   CT chest 06/19/2013 with mild progression of bilateral pulmonary nodules/metastases.   Decision made to continue FOLFIRI/Avastin. She completed cycle 11 06/21/2013.   Cycle 12 FOLFIRI/Avastin 07/05/2013   Cycle 13 FOLFIRI/Avastin 07/19/2013   Cycle 14 FOLFIRI/Avastin 08/01/2013.   CT chest 08/28/2013 with increased in size of bilateral lung nodules.   PET scan 09/22/2013 with multiple hypermetabolic pulmonary nodules bilaterally. Nodules demonstrate progressive enlargement compared with prior CTs. No evidence of extrathoracic metastatic disease.   Status post SRS 2 lung nodules, completed 10/18/2013  CT 01/12/2014 with airspace disease surrounding treated pulmonary nodules, no new nodules  01/12/2014 CEA 0.9  CT chest 04/12/2014 with improvement in airspace disease surrounding treated pulmonary nodules, no new nodules  04/12/2014 CEA 8.2  07/03/2014 CEA 60.3  CT 07/03/2014 with masslike enlargement of 2 treated lung lesions  Cycle 1 Lonsurf 07/12/2014.  Cycle 2 Lonsurf 08/11/2014 or 08/12/2014  Cycle 3 Lonsurf 09/08/2014  Cycle 4 Lonsurf 10/06/2014  Restaging CT of the chest on 10/01/2014 with stable lung nodules  Cycle 5 Lonsurf 11/05/2014  Cycle 6 Lonsurf 12/03/2014  Restaging CTs of the chest, abdomen, and pelvis on 12/26/2014 with no evidence of disease progression, stable lung nodules  Cycle 7 Lonsurf 12/31/2014  Cycle 8 Lonsurf 01/28/2015  2. Microcytic anemia, iron deficiency. Improved.  3. Status post Port-A-Cath placement 03/14/2012. X-ray showed the  Port-A-Cath tip to be in the azygos vein. The left-sided Port-A-Cath was removed and a new right Port-A-Cath was placed on 05/23/2012. 4. 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 5. Hypertension-persistent on amlodipine. HCTZ added 12/26/2014 6. Delayed nausea following cycle 5 FOLFIRI/Avastin. Emend was added to the premedication regimen 7. Nausea secondary to Lonsurf-improved with Ativan    Disposition:  [name] appears stable. She will begin another cycle of Lonsurf on 01/28/2015. She will return for a lab visit 01/24/2015.  [name] will be scheduled for office visit 02/21/2015. The CEA was higher on 831, but the restaging CTs showed no evidence of disease progression. We will continue monitoring the CEA with each cycle of Lonsurf with the plan to obtain a restaging CT evaluation at a three-month interval.  Betsy Coder, MD  01/17/2015  10:43 AM

## 2015-01-18 ENCOUNTER — Telehealth: Payer: Self-pay | Admitting: Nutrition

## 2015-01-18 NOTE — Telephone Encounter (Signed)
I returned patient call.  She is wondering if drinking alkaline water will decrease CEA. Patient did not answer phone and I was unable to leave a message. Will try to contact patient Monday.

## 2015-01-24 ENCOUNTER — Ambulatory Visit (HOSPITAL_BASED_OUTPATIENT_CLINIC_OR_DEPARTMENT_OTHER): Payer: Medicare Other

## 2015-01-24 ENCOUNTER — Other Ambulatory Visit (HOSPITAL_BASED_OUTPATIENT_CLINIC_OR_DEPARTMENT_OTHER): Payer: Medicare Other

## 2015-01-24 VITALS — BP 136/82 | HR 73 | Temp 98.2°F | Resp 18

## 2015-01-24 DIAGNOSIS — C187 Malignant neoplasm of sigmoid colon: Secondary | ICD-10-CM

## 2015-01-24 DIAGNOSIS — C189 Malignant neoplasm of colon, unspecified: Secondary | ICD-10-CM | POA: Diagnosis not present

## 2015-01-24 DIAGNOSIS — Z452 Encounter for adjustment and management of vascular access device: Secondary | ICD-10-CM | POA: Diagnosis not present

## 2015-01-24 DIAGNOSIS — Z95828 Presence of other vascular implants and grafts: Secondary | ICD-10-CM

## 2015-01-24 DIAGNOSIS — D509 Iron deficiency anemia, unspecified: Secondary | ICD-10-CM | POA: Diagnosis not present

## 2015-01-24 DIAGNOSIS — C787 Secondary malignant neoplasm of liver and intrahepatic bile duct: Secondary | ICD-10-CM | POA: Diagnosis not present

## 2015-01-24 LAB — CBC WITH DIFFERENTIAL/PLATELET
BASO%: 1.2 % (ref 0.0–2.0)
BASOS ABS: 0.1 10*3/uL (ref 0.0–0.1)
EOS%: 1.6 % (ref 0.0–7.0)
Eosinophils Absolute: 0.1 10*3/uL (ref 0.0–0.5)
HEMATOCRIT: 31.5 % — AB (ref 34.8–46.6)
HGB: 10.5 g/dL — ABNORMAL LOW (ref 11.6–15.9)
LYMPH%: 31.2 % (ref 14.0–49.7)
MCH: 31.5 pg (ref 25.1–34.0)
MCHC: 33.4 g/dL (ref 31.5–36.0)
MCV: 94.4 fL (ref 79.5–101.0)
MONO#: 0.3 10*3/uL (ref 0.1–0.9)
MONO%: 7.8 % (ref 0.0–14.0)
NEUT#: 2.5 10*3/uL (ref 1.5–6.5)
NEUT%: 58.2 % (ref 38.4–76.8)
Platelets: 270 10*3/uL (ref 145–400)
RBC: 3.34 10*6/uL — AB (ref 3.70–5.45)
RDW: 15.3 % — ABNORMAL HIGH (ref 11.2–14.5)
WBC: 4.4 10*3/uL (ref 3.9–10.3)
lymph#: 1.4 10*3/uL (ref 0.9–3.3)

## 2015-01-24 LAB — COMPREHENSIVE METABOLIC PANEL (CC13)
ALT: 38 U/L (ref 0–55)
AST: 35 U/L — AB (ref 5–34)
Albumin: 4 g/dL (ref 3.5–5.0)
Alkaline Phosphatase: 88 U/L (ref 40–150)
Anion Gap: 8 mEq/L (ref 3–11)
BUN: 15.8 mg/dL (ref 7.0–26.0)
CALCIUM: 10.1 mg/dL (ref 8.4–10.4)
CHLORIDE: 106 meq/L (ref 98–109)
CO2: 25 meq/L (ref 22–29)
Creatinine: 0.9 mg/dL (ref 0.6–1.1)
EGFR: 80 mL/min/{1.73_m2} — ABNORMAL LOW (ref >90)
Glucose: 98 mg/dl (ref 70–140)
POTASSIUM: 3.7 meq/L (ref 3.5–5.1)
SODIUM: 140 meq/L (ref 136–145)
Total Bilirubin: 0.67 mg/dL (ref 0.20–1.20)
Total Protein: 7.9 g/dL (ref 6.4–8.3)

## 2015-01-24 MED ORDER — SODIUM CHLORIDE 0.9 % IJ SOLN
10.0000 mL | INTRAMUSCULAR | Status: DC | PRN
  Administered 2015-01-24: 10 mL via INTRAVENOUS
  Filled 2015-01-24: qty 10

## 2015-01-24 MED ORDER — HEPARIN SOD (PORK) LOCK FLUSH 100 UNIT/ML IV SOLN
500.0000 [IU] | Freq: Once | INTRAVENOUS | Status: AC
  Administered 2015-01-24: 500 [IU] via INTRAVENOUS
  Filled 2015-01-24: qty 5

## 2015-01-24 NOTE — Patient Instructions (Signed)

## 2015-01-25 LAB — CEA: CEA: 609.1 ng/mL — ABNORMAL HIGH (ref 0.0–5.0)

## 2015-02-03 ENCOUNTER — Other Ambulatory Visit: Payer: Self-pay | Admitting: Oncology

## 2015-02-20 ENCOUNTER — Telehealth: Payer: Self-pay | Admitting: Oncology

## 2015-02-20 NOTE — Telephone Encounter (Signed)
Returned patient call re r/s 10/27 appointment due to transportation. After speaking with patient - patient transferred to SW (AE) to see if help with transportation can be obtained for tomorrow's appointment. No changes made at this time.

## 2015-02-21 ENCOUNTER — Telehealth: Payer: Self-pay | Admitting: Oncology

## 2015-02-21 ENCOUNTER — Other Ambulatory Visit (HOSPITAL_BASED_OUTPATIENT_CLINIC_OR_DEPARTMENT_OTHER): Payer: Medicare Other

## 2015-02-21 ENCOUNTER — Encounter: Payer: Self-pay | Admitting: *Deleted

## 2015-02-21 ENCOUNTER — Ambulatory Visit (HOSPITAL_BASED_OUTPATIENT_CLINIC_OR_DEPARTMENT_OTHER): Payer: Medicare Other | Admitting: Oncology

## 2015-02-21 ENCOUNTER — Ambulatory Visit (HOSPITAL_BASED_OUTPATIENT_CLINIC_OR_DEPARTMENT_OTHER): Payer: Medicare Other

## 2015-02-21 ENCOUNTER — Other Ambulatory Visit: Payer: Self-pay | Admitting: *Deleted

## 2015-02-21 VITALS — BP 148/86 | HR 86 | Temp 98.5°F | Resp 21 | Ht 71.0 in | Wt 267.3 lb

## 2015-02-21 DIAGNOSIS — Z95828 Presence of other vascular implants and grafts: Secondary | ICD-10-CM

## 2015-02-21 DIAGNOSIS — I10 Essential (primary) hypertension: Secondary | ICD-10-CM

## 2015-02-21 DIAGNOSIS — Z86718 Personal history of other venous thrombosis and embolism: Secondary | ICD-10-CM | POA: Diagnosis not present

## 2015-02-21 DIAGNOSIS — C187 Malignant neoplasm of sigmoid colon: Secondary | ICD-10-CM

## 2015-02-21 DIAGNOSIS — C787 Secondary malignant neoplasm of liver and intrahepatic bile duct: Secondary | ICD-10-CM

## 2015-02-21 DIAGNOSIS — Z452 Encounter for adjustment and management of vascular access device: Secondary | ICD-10-CM | POA: Diagnosis not present

## 2015-02-21 DIAGNOSIS — Z7901 Long term (current) use of anticoagulants: Secondary | ICD-10-CM

## 2015-02-21 DIAGNOSIS — C189 Malignant neoplasm of colon, unspecified: Secondary | ICD-10-CM

## 2015-02-21 DIAGNOSIS — C7802 Secondary malignant neoplasm of left lung: Secondary | ICD-10-CM

## 2015-02-21 DIAGNOSIS — C7801 Secondary malignant neoplasm of right lung: Secondary | ICD-10-CM

## 2015-02-21 LAB — CBC WITH DIFFERENTIAL/PLATELET
BASO%: 0.2 % (ref 0.0–2.0)
Basophils Absolute: 0 10*3/uL (ref 0.0–0.1)
EOS%: 2.5 % (ref 0.0–7.0)
Eosinophils Absolute: 0.1 10*3/uL (ref 0.0–0.5)
HCT: 31.5 % — ABNORMAL LOW (ref 34.8–46.6)
HEMOGLOBIN: 10.2 g/dL — AB (ref 11.6–15.9)
LYMPH%: 24.7 % (ref 14.0–49.7)
MCH: 31.2 pg (ref 25.1–34.0)
MCHC: 32.4 g/dL (ref 31.5–36.0)
MCV: 96.3 fL (ref 79.5–101.0)
MONO#: 0.4 10*3/uL (ref 0.1–0.9)
MONO%: 7.4 % (ref 0.0–14.0)
NEUT%: 65.2 % (ref 38.4–76.8)
NEUTROS ABS: 3.6 10*3/uL (ref 1.5–6.5)
Platelets: 262 10*3/uL (ref 145–400)
RBC: 3.27 10*6/uL — ABNORMAL LOW (ref 3.70–5.45)
RDW: 16.6 % — AB (ref 11.2–14.5)
WBC: 5.5 10*3/uL (ref 3.9–10.3)
lymph#: 1.4 10*3/uL (ref 0.9–3.3)

## 2015-02-21 LAB — COMPREHENSIVE METABOLIC PANEL (CC13)
ALBUMIN: 4 g/dL (ref 3.5–5.0)
ALK PHOS: 98 U/L (ref 40–150)
ALT: 38 U/L (ref 0–55)
AST: 42 U/L — AB (ref 5–34)
Anion Gap: 8 mEq/L (ref 3–11)
BILIRUBIN TOTAL: 0.78 mg/dL (ref 0.20–1.20)
BUN: 14.3 mg/dL (ref 7.0–26.0)
CO2: 25 mEq/L (ref 22–29)
Calcium: 10.3 mg/dL (ref 8.4–10.4)
Chloride: 107 mEq/L (ref 98–109)
Creatinine: 1 mg/dL (ref 0.6–1.1)
EGFR: 75 mL/min/{1.73_m2} — ABNORMAL LOW (ref >90)
GLUCOSE: 104 mg/dL (ref 70–140)
Potassium: 3.8 mEq/L (ref 3.5–5.1)
SODIUM: 140 meq/L (ref 136–145)
TOTAL PROTEIN: 8 g/dL (ref 6.4–8.3)

## 2015-02-21 MED ORDER — LORAZEPAM 0.5 MG PO TABS: 0.5000 mg | ORAL_TABLET | Freq: Four times a day (QID) | ORAL | Status: DC | PRN

## 2015-02-21 MED ORDER — SODIUM CHLORIDE 0.9 % IJ SOLN
10.0000 mL | INTRAMUSCULAR | Status: DC | PRN
  Administered 2015-02-21: 10 mL via INTRAVENOUS
  Filled 2015-02-21: qty 10

## 2015-02-21 MED ORDER — HEPARIN SOD (PORK) LOCK FLUSH 100 UNIT/ML IV SOLN
500.0000 [IU] | Freq: Once | INTRAVENOUS | Status: AC
  Administered 2015-02-21: 500 [IU] via INTRAVENOUS
  Filled 2015-02-21: qty 5

## 2015-02-21 NOTE — Progress Notes (Signed)
[facility] OFFICE PROGRESS NOTE   Diagnosis: Colon cancer  INTERVAL HISTORY:   [name] returns as scheduled. She completed another cycle of Lonsurf beginning 01/28/2015. No diarrhea or rash. She reports nausea following chemotherapy, partially improved with Ativan. No other complaint.  Objective:  Vital signs in last 24 hours:  Blood pressure 148/86, pulse 86, temperature 98.5 F (36.9 C), temperature source Oral, resp. rate 21, height _0  (1.803 m), weight 267 lb 4.8 oz (121.246 kg), SpO2 100 %.    HEENT: No thrush or ulcers Lymphatics: No cervical or supra-clavicular nodes Resp: Lungs clear bilaterally Cardio: Regular rate and rhythm GI: No hepatomegaly, nontender Vascular: No leg edema   Portacath/PICC-without erythema  Lab Results:  Lab Results  Component Value Date   WBC 5.5 02/21/2015   HGB 10.2* 02/21/2015   HCT 31.5* 02/21/2015   MCV 96.3 02/21/2015   PLT 262 02/21/2015   NEUTROABS 3.6 02/21/2015      Lab Results  Component Value Date   CEA 609.1* 01/24/2015     Medications: I have reviewed the patient's current medications.  Assessment/Plan: 1. Metastatic colon cancer (T4, N0, M1) adenocarcinoma of the sigmoid colon status post sigmoid colectomy and partial right hepatectomy 04/11/2012. Microsatellite stable. No loss of mismatch repair proteins. K-ras wild-type on the initial codon 12 and 13 testing. K-ras Q61L and APC mutations identified on extended testing. BRAF not mutated.  She completed cycle 1 CAPOX beginning 05/25/2012.   She completed cycle 8 CAPOX beginning 10/19/2012.   CEA on 10/19/2012 normal at 0.8.   CEA 6.1 on 01/11/2013   Restaging CT on 01/11/2013 consistent with multiple new pulmonary metastases   Cycle 1 of FOLFIRI/Avastin 01/18/2013; cycle 2 02/01/2013,cycle 3 02/16/2013.   CEA 5.0 on 03/01/2013.   Cycle 4 FOLFIRI/Avastin 03/01/2013.   Cycle 5 FOLFIRI/Avastin 03/15/2013.   Restaging  chest CT 03/29/2013 with possible slight decrease in size of a left upper lobe metastasis. Other pulmonary nodules stable.   Cycle 6 FOLFIRI/Avastin 03/29/2013   Cycle 7 of FOLFIRI/Avastin 04/12/2013.   CEA 6.3 05/03/2013.   Cycle 8 FOLFIRI/Avastin 05/03/2013.   Cycle 9 FOLFIRI/Avastin 05/17/2013.   Cycle 10 FOLFIRI/Avastin 05/31/2013.   CEA 10.6 on 06/19/2013.   CT chest 06/19/2013 with mild progression of bilateral pulmonary nodules/metastases.   Decision made to continue FOLFIRI/Avastin. She completed cycle 11 06/21/2013.   Cycle 12 FOLFIRI/Avastin 07/05/2013   Cycle 13 FOLFIRI/Avastin 07/19/2013   Cycle 14 FOLFIRI/Avastin 08/01/2013.   CT chest 08/28/2013 with increased in size of bilateral lung nodules.   PET scan 09/22/2013 with multiple hypermetabolic pulmonary nodules bilaterally. Nodules demonstrate progressive enlargement compared with prior CTs. No evidence of extrathoracic metastatic disease.   Status post SRS 2 lung nodules, completed 10/18/2013  CT 01/12/2014 with airspace disease surrounding treated pulmonary nodules, no new nodules  01/12/2014 CEA 0.9  CT chest 04/12/2014 with improvement in airspace disease surrounding treated pulmonary nodules, no new nodules  04/12/2014 CEA 8.2  07/03/2014 CEA 60.3  CT 07/03/2014 with masslike enlargement of 2 treated lung lesions  Cycle 1 Lonsurf 07/12/2014.  Cycle 2 Lonsurf 08/11/2014 or 08/12/2014  Cycle 3 Lonsurf 09/08/2014  Cycle 4 Lonsurf 10/06/2014  Restaging CT of the chest on 10/01/2014 with stable lung nodules  Cycle 5 Lonsurf 11/05/2014  Cycle 6 Lonsurf 12/03/2014  Restaging CTs of the chest, abdomen, and pelvis on 12/26/2014 with no evidence of disease progression, stable lung nodules  Cycle 7 Lonsurf 12/31/2014  Cycle 8 Lonsurf 01/28/2015 2. Microcytic anemia,  iron deficiency. Improved.  3. Status post Port-A-Cath placement 03/14/2012. X-ray showed the Port-A-Cath tip to  be in the azygos vein. The left-sided Port-A-Cath was removed and a new right Port-A-Cath was placed on 05/23/2012. 4. 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 5. Hypertension-persistent on amlodipine. HCTZ added 12/26/2014 6. Delayed nausea following cycle 5 FOLFIRI/Avastin. Emend was added to the premedication regimen 7. Nausea secondary to Lonsurf-improved with Ativan   Disposition:  [name] appears stable. The CEA has been rising over the past few months. The plan is to discontinue Lonsurf if the CEA is higher today. She will be referred for a restaging CT evaluation prior to an office visit on 03/19/2015.  She does not have transportation to travel for a clinical trial. We will likely resume oxaliplatin based therapy if a CT confirms disease progression.  Betsy Coder, MD  02/21/2015  12:33 PM

## 2015-02-21 NOTE — Telephone Encounter (Signed)
Gave adn printed appt sched and avs for pt for NOV....gv barium

## 2015-02-21 NOTE — Telephone Encounter (Signed)
Gave and printed appt sched and avs for pt for NOV...gv barium

## 2015-02-21 NOTE — Progress Notes (Signed)
[place] Work  Clinical Social Work was referred by patient for assessment of psychosocial needs due to issues with transportation. Pt was able to get funds for [facility] pass today and CSW provided pt with 10 ride [facility] pass to assist with future appointments. Pt used up her grant funds long ago and it is not time to reapply for Cancer [facility].   Clinical Social Worker met with pt prior to her appointments today. Pt reports her rent takes up most of her income and CSW supplied her with a list of senior apartments for low incomes. Pt reports her family and friends have stopped helping her and this has been extremely difficult for her. CSW provided emotional support and really encouraged pt to reach out when needs arise.  Clinical Social Work interventions: Supportive listening Resource assistance  [name], [place] Worker [place]  [place] Phone: [phone] Fax: [phone]

## 2015-02-21 NOTE — Patient Instructions (Signed)

## 2015-02-22 ENCOUNTER — Telehealth: Payer: Self-pay | Admitting: *Deleted

## 2015-02-22 LAB — CEA: CEA: 878.7 ng/mL — ABNORMAL HIGH (ref 0.0–5.0)

## 2015-02-22 NOTE — Telephone Encounter (Signed)
Per Dr. Benay Spice; left voice message for pt to call office back re: cea result, stop Lonsurf and will proceed with CT.

## 2015-02-22 NOTE — Telephone Encounter (Signed)
-----   Message from Ladell Pier, MD sent at 02/22/2015 11:19 AM EDT ----- Please call patient, cea is higher, no more Lonsurf,proceed with CTs/f/u as scheduled

## 2015-02-27 ENCOUNTER — Telehealth: Payer: Self-pay | Admitting: *Deleted

## 2015-02-27 NOTE — Telephone Encounter (Signed)
Returned call to pt, instructed her to hold Lonsurf until seen by MD. Will need CT scan prior to office visit. Pt voiced understanding. Pt reports she had diarrhea after drinking contrast last time, asks to drink water based contrast. Informed her she will need to report to radiology 2 hours early for scan and request water based contrast. She voiced understanding.

## 2015-02-27 NOTE — Telephone Encounter (Signed)
Received VM message from patient returning call to Dr. Gearldine Shown nurse.  Please return call to patient.

## 2015-03-12 ENCOUNTER — Ambulatory Visit (HOSPITAL_BASED_OUTPATIENT_CLINIC_OR_DEPARTMENT_OTHER): Payer: Medicare Other

## 2015-03-12 ENCOUNTER — Other Ambulatory Visit (HOSPITAL_BASED_OUTPATIENT_CLINIC_OR_DEPARTMENT_OTHER): Payer: Medicare Other

## 2015-03-12 DIAGNOSIS — C187 Malignant neoplasm of sigmoid colon: Secondary | ICD-10-CM | POA: Diagnosis not present

## 2015-03-12 DIAGNOSIS — C787 Secondary malignant neoplasm of liver and intrahepatic bile duct: Secondary | ICD-10-CM

## 2015-03-12 DIAGNOSIS — Z95828 Presence of other vascular implants and grafts: Secondary | ICD-10-CM

## 2015-03-12 DIAGNOSIS — Z452 Encounter for adjustment and management of vascular access device: Secondary | ICD-10-CM

## 2015-03-12 DIAGNOSIS — C189 Malignant neoplasm of colon, unspecified: Secondary | ICD-10-CM

## 2015-03-12 LAB — CBC WITH DIFFERENTIAL/PLATELET
BASO%: 0.9 % (ref 0.0–2.0)
BASOS ABS: 0.1 10*3/uL (ref 0.0–0.1)
EOS%: 5.3 % (ref 0.0–7.0)
Eosinophils Absolute: 0.3 10*3/uL (ref 0.0–0.5)
HEMATOCRIT: 32 % — AB (ref 34.8–46.6)
HGB: 10.5 g/dL — ABNORMAL LOW (ref 11.6–15.9)
LYMPH%: 22.6 % (ref 14.0–49.7)
MCH: 30.8 pg (ref 25.1–34.0)
MCHC: 32.7 g/dL (ref 31.5–36.0)
MCV: 94.3 fL (ref 79.5–101.0)
MONO#: 0.5 10*3/uL (ref 0.1–0.9)
MONO%: 8 % (ref 0.0–14.0)
NEUT#: 3.9 10*3/uL (ref 1.5–6.5)
NEUT%: 63.2 % (ref 38.4–76.8)
Platelets: 265 10*3/uL (ref 145–400)
RBC: 3.4 10*6/uL — AB (ref 3.70–5.45)
RDW: 16.7 % — AB (ref 11.2–14.5)
WBC: 6.2 10*3/uL (ref 3.9–10.3)
lymph#: 1.4 10*3/uL (ref 0.9–3.3)

## 2015-03-12 LAB — COMPREHENSIVE METABOLIC PANEL (CC13)
ALT: 39 U/L (ref 0–55)
AST: 48 U/L — AB (ref 5–34)
Albumin: 3.8 g/dL (ref 3.5–5.0)
Alkaline Phosphatase: 105 U/L (ref 40–150)
Anion Gap: 10 mEq/L (ref 3–11)
BUN: 15.2 mg/dL (ref 7.0–26.0)
CHLORIDE: 108 meq/L (ref 98–109)
CO2: 23 meq/L (ref 22–29)
CREATININE: 0.8 mg/dL (ref 0.6–1.1)
Calcium: 10 mg/dL (ref 8.4–10.4)
EGFR: 88 mL/min/{1.73_m2} — ABNORMAL LOW (ref >90)
GLUCOSE: 95 mg/dL (ref 70–140)
Potassium: 4 mEq/L (ref 3.5–5.1)
SODIUM: 141 meq/L (ref 136–145)
Total Bilirubin: 0.34 mg/dL (ref 0.20–1.20)
Total Protein: 7.8 g/dL (ref 6.4–8.3)

## 2015-03-12 LAB — CEA: CEA: 987.8 ng/mL — ABNORMAL HIGH (ref 0.0–5.0)

## 2015-03-12 MED ORDER — HEPARIN SOD (PORK) LOCK FLUSH 100 UNIT/ML IV SOLN
500.0000 [IU] | Freq: Once | INTRAVENOUS | Status: AC
  Administered 2015-03-12: 500 [IU] via INTRAVENOUS
  Filled 2015-03-12: qty 5

## 2015-03-12 MED ORDER — SODIUM CHLORIDE 0.9 % IJ SOLN
10.0000 mL | INTRAMUSCULAR | Status: DC | PRN
  Administered 2015-03-12: 10 mL via INTRAVENOUS
  Filled 2015-03-12: qty 10

## 2015-03-12 NOTE — Patient Instructions (Signed)

## 2015-03-18 ENCOUNTER — Other Ambulatory Visit: Payer: Self-pay | Admitting: Oncology

## 2015-03-19 ENCOUNTER — Ambulatory Visit (HOSPITAL_COMMUNITY)
Admission: RE | Admit: 2015-03-19 | Discharge: 2015-03-19 | Disposition: A | Discharge status: Home / Self Care | Payer: Medicare Other | Source: Ambulatory Visit | Attending: Oncology | Admitting: Oncology

## 2015-03-19 ENCOUNTER — Encounter: Payer: Self-pay | Admitting: *Deleted

## 2015-03-19 ENCOUNTER — Telehealth: Payer: Self-pay | Admitting: *Deleted

## 2015-03-19 ENCOUNTER — Ambulatory Visit (HOSPITAL_BASED_OUTPATIENT_CLINIC_OR_DEPARTMENT_OTHER): Payer: Medicare Other | Admitting: Oncology

## 2015-03-19 ENCOUNTER — Encounter (HOSPITAL_COMMUNITY): Payer: Self-pay

## 2015-03-19 VITALS — BP 169/90 | HR 89 | Temp 98.5°F | Resp 18 | Wt 269.6 lb

## 2015-03-19 DIAGNOSIS — C787 Secondary malignant neoplasm of liver and intrahepatic bile duct: Secondary | ICD-10-CM

## 2015-03-19 DIAGNOSIS — C7801 Secondary malignant neoplasm of right lung: Secondary | ICD-10-CM | POA: Diagnosis not present

## 2015-03-19 DIAGNOSIS — C7951 Secondary malignant neoplasm of bone: Secondary | ICD-10-CM | POA: Insufficient documentation

## 2015-03-19 DIAGNOSIS — Z9049 Acquired absence of other specified parts of digestive tract: Secondary | ICD-10-CM | POA: Diagnosis not present

## 2015-03-19 DIAGNOSIS — C189 Malignant neoplasm of colon, unspecified: Secondary | ICD-10-CM | POA: Insufficient documentation

## 2015-03-19 DIAGNOSIS — C7802 Secondary malignant neoplasm of left lung: Secondary | ICD-10-CM | POA: Insufficient documentation

## 2015-03-19 MED ORDER — IOHEXOL 300 MG/ML  SOLN
150.0000 mL | Freq: Once | INTRAMUSCULAR | Status: AC | PRN
  Administered 2015-03-19: 100 mL via INTRAVENOUS

## 2015-03-19 MED ORDER — LORAZEPAM 0.5 MG PO TABS: 0.5000 mg | ORAL_TABLET | Freq: Four times a day (QID) | ORAL | Status: DC | PRN

## 2015-03-19 MED ORDER — IOHEXOL 300 MG/ML  SOLN
50.0000 mL | Freq: Once | INTRAMUSCULAR | Status: AC | PRN
  Administered 2015-03-19: 50 mL via ORAL

## 2015-03-19 NOTE — Progress Notes (Signed)
Oncology Nurse Navigator Documentation  Oncology Nurse Navigator Flowsheets 03/19/2015  Navigator Encounter Type Followup after scan  Patient Visit Type Medonc  Treatment Phase Abnormal Scans--progression  Barriers/Navigation Needs Financial  Interventions Other--made CSW aware of resumption of IV chemo  Time Spent with Patient 77  Chaniece still has not found a less expensive senior apartment. Still uses [facility] for her medical appointments. Does not appear concerned about resumption of chemotherapy. Plan to resume on 03/25/15. Ativan script sent to [facility] per her request.

## 2015-03-19 NOTE — Progress Notes (Signed)
[facility] OFFICE PROGRESS NOTE   Diagnosis: Colon cancer  INTERVAL HISTORY:   [name] returns as scheduled. She feels well. She has an occasional cough secondary to "allergies ". No hemoptysis or consistent cough. She had numbness in the feet, this does not interfere with activity.  Objective:  Vital signs in last 24 hours:  Blood pressure 169/90, pulse 89, temperature 98.5 F (36.9 C), temperature source Oral, resp. rate 18, weight 269 lb 9.6 oz (122.29 kg), SpO2 100 %.    HEENT: Neck without mass Lymphatics: No cervical, supra-clavicular, axillary, or inguinal nodes Resp: Lungs clear bilaterally Cardio: Regular rate and rhythm GI: No hepatomegaly, nontender, no mass Vascular: No leg edema Neurologic: Moderate loss of vibratory sense at the fingertips bilaterally  Portacath/PICC-without erythema  Lab Results:  Lab Results  Component Value Date   WBC 6.2 03/12/2015   HGB 10.5* 03/12/2015   HCT 32.0* 03/12/2015   MCV 94.3 03/12/2015   PLT 265 03/12/2015   NEUTROABS 3.9 03/12/2015      Lab Results  Component Value Date   CEA 987.8* 03/12/2015    Imaging:  Ct Chest W Contrast  03/19/2015  CLINICAL DATA:  Restaging metastatic colon cancer originally diagnosed in 2013. EXAM: CT CHEST, ABDOMEN, AND PELVIS WITH CONTRAST TECHNIQUE: Multidetector CT imaging of the chest, abdomen and pelvis was performed following the standard protocol during bolus administration of intravenous contrast. CONTRAST:  142m OMNIPAQUE IOHEXOL 300 MG/ML  SOLN COMPARISON:  12/26/2014 CT chest, abdomen and pelvis. FINDINGS: CT CHEST FINDINGS Mediastinum/Nodes: Normal heart size. No pericardial fluid/thickening. Right internal jugular MediPort terminates at the cavoatrial junction. Great vessels are normal in course and caliber. No central pulmonary emboli. Bilateral thyroid lobe nodules appear decreased slightly in size, with a dominant 1.6 cm hypodense left thyroid lobe nodule,  previously 2.0 cm. Normal esophagus. No pathologically enlarged axillary, mediastinal or hilar lymph nodes. Heterogeneously fatty soft tissue in the anterior mediastinum is stable, favor thymic hyperplasia . Lungs/Pleura: No pneumothorax. No pleural effusion. There has been interval growth of the bilateral lung metastases. For example a 4.2 x 3.5 cm right middle lobe lung mass (series 4/ image 29) previously measured 3.8 x 3.3 cm using similar measurement technique, increased. A 5.2 x 4.0 cm left upper lobe lung mass (4/17) previously measured 4.5 x 3.8 cm using similar measurement technique, increased. A 2.2 x 1.8 cm left lower lobe pulmonary nodule associated with the major fissure (4/23) is increased from 1.7 x 1.7 cm. No new significant pulmonary nodules. No acute consolidative airspace disease. Musculoskeletal: There is an enlarging lytic osseous metastasis in the T6 vertebral body, which now all replaces much of the vertebral body. Mild-to-moderate degenerative changes in the thoracic spine. CT ABDOMEN PELVIS FINDINGS Hepatobiliary: Diffuse hepatic steatosis. Stable postsurgical changes in the inferior right liver lobe status post partial hepatectomy. No liver mass. Status post cholecystectomy. No biliary ductal dilatation. Pancreas: Normal, with no mass or duct dilation. Spleen: Normal size. No mass. Adrenals/Urinary Tract: Normal adrenals. Normal kidneys with no hydronephrosis and no renal mass. Retrocaval left renal vein, which is patent. Normal bladder. Stomach/Bowel: Grossly normal stomach. Normal caliber small bowel with no small bowel wall thickening. Stable postsurgical changes status post ileocecal resection with ileocolonic anastomosis in the central abdomen. Stable rectosigmoid anastomosis from prior subtotal sigmoid colectomy. No large bowel wall thickening or pericolonic fat stranding. Vascular/Lymphatic: Atherosclerotic nonaneurysmal abdominal aorta. Patent portal, splenic, hepatic and renal  veins. No pathologically enlarged lymph nodes in the abdomen or  pelvis. Reproductive: Status post hysterectomy, with no abnormal findings at the vaginal cuff. No adnexal mass. Other: No pneumoperitoneum, ascites or focal fluid collection. Musculoskeletal: Enlarging lytic bone metastasis in the supra-acetabular right iliac bone (series 2/ image 95). New lytic osseous metastasis in the right iliac wing (2/83). Stable small fat containing supraumbilical ventral abdominal hernia . Stable small fat containing right inguinal hernia. IMPRESSION: 1. Interval growth of bilateral pulmonary metastases. 2. Interval growth of lytic osseous metastases in the T6 vertebral body and right iliac bone. The T6 osseous metastasis replaces much of the vertebral body, with no appreciable extension of the mass into the thoracic spinal canal at this time. 3. Stable appearance of the liver status post partial hepatectomy, with no liver metastases. 4. Stable appearance of the colon status post partial resection, with no evidence of local tumor recurrence. Electronically Signed   By: Ilona Sorrel M.D.   On: 03/19/2015 11:17   Ct Abdomen Pelvis W Contrast  03/19/2015  CLINICAL DATA:  Restaging metastatic colon cancer originally diagnosed in 2013. EXAM: CT CHEST, ABDOMEN, AND PELVIS WITH CONTRAST TECHNIQUE: Multidetector CT imaging of the chest, abdomen and pelvis was performed following the standard protocol during bolus administration of intravenous contrast. CONTRAST:  110m OMNIPAQUE IOHEXOL 300 MG/ML  SOLN COMPARISON:  12/26/2014 CT chest, abdomen and pelvis. FINDINGS: CT CHEST FINDINGS Mediastinum/Nodes: Normal heart size. No pericardial fluid/thickening. Right internal jugular MediPort terminates at the cavoatrial junction. Great vessels are normal in course and caliber. No central pulmonary emboli. Bilateral thyroid lobe nodules appear decreased slightly in size, with a dominant 1.6 cm hypodense left thyroid lobe nodule, previously  2.0 cm. Normal esophagus. No pathologically enlarged axillary, mediastinal or hilar lymph nodes. Heterogeneously fatty soft tissue in the anterior mediastinum is stable, favor thymic hyperplasia . Lungs/Pleura: No pneumothorax. No pleural effusion. There has been interval growth of the bilateral lung metastases. For example a 4.2 x 3.5 cm right middle lobe lung mass (series 4/ image 29) previously measured 3.8 x 3.3 cm using similar measurement technique, increased. A 5.2 x 4.0 cm left upper lobe lung mass (4/17) previously measured 4.5 x 3.8 cm using similar measurement technique, increased. A 2.2 x 1.8 cm left lower lobe pulmonary nodule associated with the major fissure (4/23) is increased from 1.7 x 1.7 cm. No new significant pulmonary nodules. No acute consolidative airspace disease. Musculoskeletal: There is an enlarging lytic osseous metastasis in the T6 vertebral body, which now all replaces much of the vertebral body. Mild-to-moderate degenerative changes in the thoracic spine. CT ABDOMEN PELVIS FINDINGS Hepatobiliary: Diffuse hepatic steatosis. Stable postsurgical changes in the inferior right liver lobe status post partial hepatectomy. No liver mass. Status post cholecystectomy. No biliary ductal dilatation. Pancreas: Normal, with no mass or duct dilation. Spleen: Normal size. No mass. Adrenals/Urinary Tract: Normal adrenals. Normal kidneys with no hydronephrosis and no renal mass. Retrocaval left renal vein, which is patent. Normal bladder. Stomach/Bowel: Grossly normal stomach. Normal caliber small bowel with no small bowel wall thickening. Stable postsurgical changes status post ileocecal resection with ileocolonic anastomosis in the central abdomen. Stable rectosigmoid anastomosis from prior subtotal sigmoid colectomy. No large bowel wall thickening or pericolonic fat stranding. Vascular/Lymphatic: Atherosclerotic nonaneurysmal abdominal aorta. Patent portal, splenic, hepatic and renal veins. No  pathologically enlarged lymph nodes in the abdomen or pelvis. Reproductive: Status post hysterectomy, with no abnormal findings at the vaginal cuff. No adnexal mass. Other: No pneumoperitoneum, ascites or focal fluid collection. Musculoskeletal: Enlarging lytic bone metastasis in the supra-acetabular right  iliac bone (series 2/ image 95). New lytic osseous metastasis in the right iliac wing (2/83). Stable small fat containing supraumbilical ventral abdominal hernia . Stable small fat containing right inguinal hernia. IMPRESSION: 1. Interval growth of bilateral pulmonary metastases. 2. Interval growth of lytic osseous metastases in the T6 vertebral body and right iliac bone. The T6 osseous metastasis replaces much of the vertebral body, with no appreciable extension of the mass into the thoracic spinal canal at this time. 3. Stable appearance of the liver status post partial hepatectomy, with no liver metastases. 4. Stable appearance of the colon status post partial resection, with no evidence of local tumor recurrence. Electronically Signed   By: Ilona Sorrel M.D.   On: 03/19/2015 11:17   images were reviewed  Medications: I have reviewed the patient's current medications.  Assessment/Plan: 1. Metastatic colon cancer (T4, N0, M1) adenocarcinoma of the sigmoid colon status post sigmoid colectomy and partial right hepatectomy 04/11/2012. Microsatellite stable. No loss of mismatch repair proteins. K-ras wild-type on the initial codon 12 and 13 testing. K-ras Q61L and APC mutations identified on extended testing. BRAF not mutated.  She completed cycle 1 CAPOX beginning 05/25/2012.   She completed cycle 8 CAPOX beginning 10/19/2012.   CEA on 10/19/2012 normal at 0.8.   CEA 6.1 on 01/11/2013   Restaging CT on 01/11/2013 consistent with multiple new pulmonary metastases   Cycle 1 of FOLFIRI/Avastin 01/18/2013; cycle 2 02/01/2013,cycle 3 02/16/2013.   CEA 5.0 on 03/01/2013.   Cycle 4  FOLFIRI/Avastin 03/01/2013.   Cycle 5 FOLFIRI/Avastin 03/15/2013.   Restaging chest CT 03/29/2013 with possible slight decrease in size of a left upper lobe metastasis. Other pulmonary nodules stable.   Cycle 6 FOLFIRI/Avastin 03/29/2013   Cycle 7 of FOLFIRI/Avastin 04/12/2013.   CEA 6.3 05/03/2013.   Cycle 8 FOLFIRI/Avastin 05/03/2013.   Cycle 9 FOLFIRI/Avastin 05/17/2013.   Cycle 10 FOLFIRI/Avastin 05/31/2013.   CEA 10.6 on 06/19/2013.   CT chest 06/19/2013 with mild progression of bilateral pulmonary nodules/metastases.   Decision made to continue FOLFIRI/Avastin. She completed cycle 11 06/21/2013.   Cycle 12 FOLFIRI/Avastin 07/05/2013   Cycle 13 FOLFIRI/Avastin 07/19/2013   Cycle 14 FOLFIRI/Avastin 08/01/2013.   CT chest 08/28/2013 with increased in size of bilateral lung nodules.   PET scan 09/22/2013 with multiple hypermetabolic pulmonary nodules bilaterally. Nodules demonstrate progressive enlargement compared with prior CTs. No evidence of extrathoracic metastatic disease.   Status post SRS 2 lung nodules, completed 10/18/2013  CT 01/12/2014 with airspace disease surrounding treated pulmonary nodules, no new nodules  01/12/2014 CEA 0.9  CT chest 04/12/2014 with improvement in airspace disease surrounding treated pulmonary nodules, no new nodules  04/12/2014 CEA 8.2  07/03/2014 CEA 60.3  CT 07/03/2014 with masslike enlargement of 2 treated lung lesions  Cycle 1 Lonsurf 07/12/2014.  Cycle 2 Lonsurf 08/11/2014 or 08/12/2014  Cycle 3 Lonsurf 09/08/2014  Cycle 4 Lonsurf 10/06/2014  Restaging CT of the chest on 10/01/2014 with stable lung nodules  Cycle 5 Lonsurf 11/05/2014  Cycle 6 Lonsurf 12/03/2014  Restaging CTs of the chest, abdomen, and pelvis on 12/26/2014 with no evidence of disease progression, stable lung nodules  Cycle 7 Lonsurf 12/31/2014  Cycle 8 Lonsurf 01/28/2015  Elevated CEA 03/12/2015  CT 03/19/2015 with  enlargement of bilateral lung masses and progressive bone metastases including an enlarging lesion at T6 with replacement of the vertebral body 2. Microcytic anemia, iron deficiency. Improved.  3. Status post Port-A-Cath placement 03/14/2012. X-ray showed the Port-A-Cath tip to be in the azygos  vein. The left-sided Port-A-Cath was removed and a new right Port-A-Cath was placed on 05/23/2012. 4. 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 5. Hypertension-persistent on amlodipine. HCTZ added 12/26/2014 6. Delayed nausea following cycle 5 FOLFIRI/Avastin. Emend was added to the premedication regimen 7. Nausea secondary to Lonsurf-improved with Ativan     Disposition:  [name] appears well. I reviewed the CT findings with her. The CEA is higher and the CT is consistent with disease progression. We discussed treatment options. Treatment options are limited given her previous therapies and the K-ras mutation. She cannot travel out of town for a clinical trial. We decided to begin treatment with FOLFOX/Avastin. I reviewed the potential toxicities associated with this regimen including the chance of an allergic reaction and progressive neuropathy with oxaliplatin. She agrees to proceed.  The plan is to begin a first cycle of FOLFOX/Avastin 03/25/2015. She will return for an office visit and chemotherapy 04/08/2015.  Betsy Coder, MD  03/19/2015  1:21 PM

## 2015-03-19 NOTE — Telephone Encounter (Signed)
Pt was not aware of office visit today. Worked in to see MD.

## 2015-03-20 ENCOUNTER — Telehealth: Payer: Self-pay | Admitting: *Deleted

## 2015-03-20 ENCOUNTER — Telehealth: Payer: Self-pay | Admitting: Oncology

## 2015-03-20 NOTE — Telephone Encounter (Signed)
Per staff message and POF I have scheduled appts. Advised scheduler of appts. JMW  

## 2015-03-20 NOTE — Telephone Encounter (Signed)
per pof tos ch pt appt-sent MW email to sch pt trmt-will call pt after reply °

## 2015-03-22 ENCOUNTER — Telehealth: Payer: Self-pay | Admitting: Oncology

## 2015-03-22 NOTE — Telephone Encounter (Signed)
per po to sch pt appt-cld pt and left a message and adv of time & date of appt on 11/28

## 2015-03-24 ENCOUNTER — Other Ambulatory Visit: Payer: Self-pay | Admitting: Oncology

## 2015-03-25 ENCOUNTER — Other Ambulatory Visit (HOSPITAL_BASED_OUTPATIENT_CLINIC_OR_DEPARTMENT_OTHER): Payer: Medicare Other

## 2015-03-25 ENCOUNTER — Ambulatory Visit: Payer: Medicare Other

## 2015-03-25 ENCOUNTER — Other Ambulatory Visit: Payer: Medicare Other

## 2015-03-25 ENCOUNTER — Ambulatory Visit (HOSPITAL_BASED_OUTPATIENT_CLINIC_OR_DEPARTMENT_OTHER): Payer: Medicare Other

## 2015-03-25 ENCOUNTER — Encounter: Payer: Self-pay | Admitting: *Deleted

## 2015-03-25 VITALS — BP 133/82 | HR 79 | Temp 98.6°F | Resp 17

## 2015-03-25 DIAGNOSIS — Z5111 Encounter for antineoplastic chemotherapy: Secondary | ICD-10-CM | POA: Diagnosis not present

## 2015-03-25 DIAGNOSIS — C189 Malignant neoplasm of colon, unspecified: Secondary | ICD-10-CM

## 2015-03-25 DIAGNOSIS — C787 Secondary malignant neoplasm of liver and intrahepatic bile duct: Secondary | ICD-10-CM

## 2015-03-25 DIAGNOSIS — Z95828 Presence of other vascular implants and grafts: Secondary | ICD-10-CM

## 2015-03-25 LAB — COMPREHENSIVE METABOLIC PANEL (CC13)
ALBUMIN: 3.8 g/dL (ref 3.5–5.0)
ALK PHOS: 94 U/L (ref 40–150)
ALT: 34 U/L (ref 0–55)
ANION GAP: 9 meq/L (ref 3–11)
AST: 44 U/L — ABNORMAL HIGH (ref 5–34)
BILIRUBIN TOTAL: 0.49 mg/dL (ref 0.20–1.20)
BUN: 14.3 mg/dL (ref 7.0–26.0)
CO2: 24 meq/L (ref 22–29)
Calcium: 10.1 mg/dL (ref 8.4–10.4)
Chloride: 107 mEq/L (ref 98–109)
Creatinine: 0.8 mg/dL (ref 0.6–1.1)
EGFR: 87 mL/min/{1.73_m2} — AB (ref >90)
Glucose: 95 mg/dl (ref 70–140)
POTASSIUM: 3.8 meq/L (ref 3.5–5.1)
Sodium: 140 mEq/L (ref 136–145)
TOTAL PROTEIN: 8 g/dL (ref 6.4–8.3)

## 2015-03-25 LAB — CBC WITH DIFFERENTIAL/PLATELET
BASO%: 1.1 % (ref 0.0–2.0)
BASOS ABS: 0.1 10*3/uL (ref 0.0–0.1)
EOS ABS: 0.3 10*3/uL (ref 0.0–0.5)
EOS%: 5.1 % (ref 0.0–7.0)
HCT: 31.7 % — ABNORMAL LOW (ref 34.8–46.6)
HGB: 10.4 g/dL — ABNORMAL LOW (ref 11.6–15.9)
LYMPH%: 20.6 % (ref 14.0–49.7)
MCH: 30.5 pg (ref 25.1–34.0)
MCHC: 32.7 g/dL (ref 31.5–36.0)
MCV: 93.2 fL (ref 79.5–101.0)
MONO#: 0.4 10*3/uL (ref 0.1–0.9)
MONO%: 6.5 % (ref 0.0–14.0)
NEUT%: 66.7 % (ref 38.4–76.8)
NEUTROS ABS: 4.4 10*3/uL (ref 1.5–6.5)
PLATELETS: 260 10*3/uL (ref 145–400)
RBC: 3.4 10*6/uL — AB (ref 3.70–5.45)
RDW: 16.6 % — ABNORMAL HIGH (ref 11.2–14.5)
WBC: 6.7 10*3/uL (ref 3.9–10.3)
lymph#: 1.4 10*3/uL (ref 0.9–3.3)

## 2015-03-25 LAB — UA PROTEIN, DIPSTICK - CHCC: Protein, ur: <30 mg/dL

## 2015-03-25 MED ORDER — DEXTROSE 5 % IV SOLN
Freq: Once | INTRAVENOUS | Status: AC
  Administered 2015-03-25: 12:00:00 via INTRAVENOUS

## 2015-03-25 MED ORDER — HEPARIN SOD (PORK) LOCK FLUSH 100 UNIT/ML IV SOLN
500.0000 [IU] | Freq: Once | INTRAVENOUS | Status: DC | PRN
  Filled 2015-03-25: qty 5

## 2015-03-25 MED ORDER — LEUCOVORIN CALCIUM INJECTION 350 MG
400.0000 mg/m2 | Freq: Once | INTRAVENOUS | Status: AC
  Administered 2015-03-25: 992 mg via INTRAVENOUS
  Filled 2015-03-25: qty 49.6

## 2015-03-25 MED ORDER — SODIUM CHLORIDE 0.9 % IJ SOLN
10.0000 mL | INTRAMUSCULAR | Status: DC | PRN
  Administered 2015-03-25: 10 mL via INTRAVENOUS
  Filled 2015-03-25: qty 10

## 2015-03-25 MED ORDER — SODIUM CHLORIDE 0.9 % IV SOLN
Freq: Once | INTRAVENOUS | Status: AC
  Administered 2015-03-25: 11:00:00 via INTRAVENOUS
  Filled 2015-03-25: qty 4

## 2015-03-25 MED ORDER — OXALIPLATIN CHEMO INJECTION 100 MG/20ML
85.0000 mg/m2 | Freq: Once | INTRAVENOUS | Status: AC
  Administered 2015-03-25: 210 mg via INTRAVENOUS
  Filled 2015-03-25: qty 40

## 2015-03-25 MED ORDER — FLUOROURACIL CHEMO INJECTION 2.5 GM/50ML
400.0000 mg/m2 | Freq: Once | INTRAVENOUS | Status: AC
  Administered 2015-03-25: 1000 mg via INTRAVENOUS
  Filled 2015-03-25: qty 20

## 2015-03-25 MED ORDER — SODIUM CHLORIDE 0.9 % IV SOLN
5.0000 mg/kg | Freq: Once | INTRAVENOUS | Status: AC
  Administered 2015-03-25: 600 mg via INTRAVENOUS
  Filled 2015-03-25: qty 24

## 2015-03-25 MED ORDER — SODIUM CHLORIDE 0.9 % IJ SOLN
10.0000 mL | INTRAMUSCULAR | Status: DC | PRN
  Filled 2015-03-25: qty 10

## 2015-03-25 MED ORDER — SODIUM CHLORIDE 0.9 % IV SOLN
2400.0000 mg/m2 | INTRAVENOUS | Status: DC
  Administered 2015-03-25: 5950 mg via INTRAVENOUS
  Filled 2015-03-25: qty 119

## 2015-03-25 NOTE — Patient Instructions (Signed)

## 2015-03-25 NOTE — Progress Notes (Signed)
Oncology Nurse Navigator Documentation  Oncology Nurse Navigator Flowsheets 03/25/2015  Navigator Encounter Type Treatment  Patient Visit Type Medonc  Treatment Phase First Chemo Tx--1st FOLFOX  Barriers/Navigation Needs Family concerns  Interventions Other  Time Spent with Patient 15  Reviewed antiemetic use and that her ativan was called into [facility] last week for her to pick up today. Reminded her of cold sensitivity precautions.

## 2015-03-25 NOTE — Patient Instructions (Signed)
[facility] Discharge Instructions for Patients Receiving Chemotherapy  Today you received the following chemotherapy agents Avastin, Oxaliplatin, Leucovorin and Fluorouracil.   To help prevent nausea and vomiting after your treatment, we encourage you to take your nausea medication as directed.If you develop nausea and vomiting that is not controlled by your nausea medication, call the clinic.   BELOW ARE SYMPTOMS THAT SHOULD BE REPORTED IMMEDIATELY:  *FEVER GREATER THAN 100.5 F  *CHILLS WITH OR WITHOUT FEVER  NAUSEA AND VOMITING THAT IS NOT CONTROLLED WITH YOUR NAUSEA MEDICATION  *UNUSUAL SHORTNESS OF BREATH  *UNUSUAL BRUISING OR BLEEDING  TENDERNESS IN MOUTH AND THROAT WITH OR WITHOUT PRESENCE OF ULCERS  *URINARY PROBLEMS  *BOWEL PROBLEMS  UNUSUAL RASH Items with * indicate a potential emergency and should be followed up as soon as possible.  Feel free to call the clinic you have any questions or concerns. The clinic phone number is [phone].  Please show the [place] at check-in to the Emergency Department and triage nurse.

## 2015-03-26 ENCOUNTER — Ambulatory Visit: Payer: Medicare Other | Admitting: Nurse Practitioner

## 2015-03-26 LAB — CEA: CEA: 1162 ng/mL — ABNORMAL HIGH (ref 0.0–5.0)

## 2015-03-27 ENCOUNTER — Ambulatory Visit (HOSPITAL_BASED_OUTPATIENT_CLINIC_OR_DEPARTMENT_OTHER): Payer: Medicare Other

## 2015-03-27 VITALS — BP 152/97 | HR 86 | Temp 98.5°F | Resp 20

## 2015-03-27 DIAGNOSIS — C189 Malignant neoplasm of colon, unspecified: Secondary | ICD-10-CM

## 2015-03-27 DIAGNOSIS — C187 Malignant neoplasm of sigmoid colon: Secondary | ICD-10-CM | POA: Diagnosis not present

## 2015-03-27 DIAGNOSIS — Z452 Encounter for adjustment and management of vascular access device: Secondary | ICD-10-CM | POA: Diagnosis present

## 2015-03-27 DIAGNOSIS — C787 Secondary malignant neoplasm of liver and intrahepatic bile duct: Principal | ICD-10-CM

## 2015-03-27 MED ORDER — HEPARIN SOD (PORK) LOCK FLUSH 100 UNIT/ML IV SOLN
500.0000 [IU] | Freq: Once | INTRAVENOUS | Status: AC | PRN
  Administered 2015-03-27: 500 [IU]
  Filled 2015-03-27: qty 5

## 2015-03-27 MED ORDER — SODIUM CHLORIDE 0.9 % IJ SOLN
10.0000 mL | INTRAMUSCULAR | Status: DC | PRN
  Administered 2015-03-27: 10 mL
  Filled 2015-03-27: qty 10

## 2015-04-03 ENCOUNTER — Ambulatory Visit: Admission: RE | Admit: 2015-04-03 | Payer: Medicare Other | Source: Ambulatory Visit | Admitting: Radiation Oncology

## 2015-04-05 ENCOUNTER — Other Ambulatory Visit: Payer: Self-pay | Admitting: *Deleted

## 2015-04-05 DIAGNOSIS — C787 Secondary malignant neoplasm of liver and intrahepatic bile duct: Principal | ICD-10-CM

## 2015-04-05 DIAGNOSIS — C7800 Secondary malignant neoplasm of unspecified lung: Secondary | ICD-10-CM

## 2015-04-05 DIAGNOSIS — C189 Malignant neoplasm of colon, unspecified: Secondary | ICD-10-CM

## 2015-04-05 MED ORDER — LOPERAMIDE HCL 2 MG PO CAPS: 2.0000 mg | ORAL_CAPSULE | ORAL | Status: DC | PRN

## 2015-04-07 ENCOUNTER — Other Ambulatory Visit: Payer: Self-pay | Admitting: Oncology

## 2015-04-08 ENCOUNTER — Encounter: Payer: Self-pay | Admitting: Oncology

## 2015-04-08 ENCOUNTER — Other Ambulatory Visit (HOSPITAL_BASED_OUTPATIENT_CLINIC_OR_DEPARTMENT_OTHER): Payer: Medicare Other

## 2015-04-08 ENCOUNTER — Other Ambulatory Visit: Payer: Medicare Other

## 2015-04-08 ENCOUNTER — Ambulatory Visit (HOSPITAL_BASED_OUTPATIENT_CLINIC_OR_DEPARTMENT_OTHER): Payer: Medicare Other

## 2015-04-08 ENCOUNTER — Ambulatory Visit (HOSPITAL_BASED_OUTPATIENT_CLINIC_OR_DEPARTMENT_OTHER): Payer: Medicare Other | Admitting: Oncology

## 2015-04-08 ENCOUNTER — Ambulatory Visit: Admission: RE | Admit: 2015-04-08 | Payer: Medicare Other | Source: Ambulatory Visit | Admitting: Radiation Oncology

## 2015-04-08 VITALS — BP 148/87 | HR 82 | Temp 98.2°F | Resp 18 | Ht 71.0 in | Wt 266.4 lb

## 2015-04-08 DIAGNOSIS — Z95828 Presence of other vascular implants and grafts: Secondary | ICD-10-CM

## 2015-04-08 DIAGNOSIS — C187 Malignant neoplasm of sigmoid colon: Secondary | ICD-10-CM | POA: Diagnosis present

## 2015-04-08 DIAGNOSIS — Z452 Encounter for adjustment and management of vascular access device: Secondary | ICD-10-CM

## 2015-04-08 DIAGNOSIS — Z86718 Personal history of other venous thrombosis and embolism: Secondary | ICD-10-CM

## 2015-04-08 DIAGNOSIS — I10 Essential (primary) hypertension: Secondary | ICD-10-CM

## 2015-04-08 DIAGNOSIS — C7801 Secondary malignant neoplasm of right lung: Secondary | ICD-10-CM

## 2015-04-08 DIAGNOSIS — Z5112 Encounter for antineoplastic immunotherapy: Secondary | ICD-10-CM

## 2015-04-08 DIAGNOSIS — C787 Secondary malignant neoplasm of liver and intrahepatic bile duct: Secondary | ICD-10-CM | POA: Diagnosis not present

## 2015-04-08 DIAGNOSIS — C189 Malignant neoplasm of colon, unspecified: Secondary | ICD-10-CM

## 2015-04-08 DIAGNOSIS — Z5111 Encounter for antineoplastic chemotherapy: Secondary | ICD-10-CM

## 2015-04-08 DIAGNOSIS — Z7901 Long term (current) use of anticoagulants: Secondary | ICD-10-CM

## 2015-04-08 DIAGNOSIS — D509 Iron deficiency anemia, unspecified: Secondary | ICD-10-CM

## 2015-04-08 DIAGNOSIS — C7802 Secondary malignant neoplasm of left lung: Secondary | ICD-10-CM

## 2015-04-08 LAB — COMPREHENSIVE METABOLIC PANEL
ALK PHOS: 129 U/L (ref 40–150)
ALT: 33 U/L (ref 0–55)
ANION GAP: 8 meq/L (ref 3–11)
AST: 39 U/L — ABNORMAL HIGH (ref 5–34)
Albumin: 3.8 g/dL (ref 3.5–5.0)
BILIRUBIN TOTAL: 0.35 mg/dL (ref 0.20–1.20)
BUN: 13.6 mg/dL (ref 7.0–26.0)
CALCIUM: 9.8 mg/dL (ref 8.4–10.4)
CO2: 24 meq/L (ref 22–29)
Chloride: 108 mEq/L (ref 98–109)
Creatinine: 0.9 mg/dL (ref 0.6–1.1)
EGFR: 85 mL/min/{1.73_m2} — AB (ref >90)
Glucose: 96 mg/dl (ref 70–140)
Potassium: 3.9 mEq/L (ref 3.5–5.1)
Sodium: 139 mEq/L (ref 136–145)
TOTAL PROTEIN: 7.9 g/dL (ref 6.4–8.3)

## 2015-04-08 LAB — CBC WITH DIFFERENTIAL/PLATELET
BASO%: 0.3 % (ref 0.0–2.0)
BASOS ABS: 0 10*3/uL (ref 0.0–0.1)
EOS ABS: 0.2 10*3/uL (ref 0.0–0.5)
EOS%: 3.4 % (ref 0.0–7.0)
HCT: 32.5 % — ABNORMAL LOW (ref 34.8–46.6)
HEMOGLOBIN: 10.6 g/dL — AB (ref 11.6–15.9)
LYMPH%: 18.9 % (ref 14.0–49.7)
MCH: 30.3 pg (ref 25.1–34.0)
MCHC: 32.6 g/dL (ref 31.5–36.0)
MCV: 92.9 fL (ref 79.5–101.0)
MONO#: 0.5 10*3/uL (ref 0.1–0.9)
MONO%: 7.4 % (ref 0.0–14.0)
NEUT#: 4.3 10*3/uL (ref 1.5–6.5)
NEUT%: 70 % (ref 38.4–76.8)
PLATELETS: 226 10*3/uL (ref 145–400)
RBC: 3.5 10*6/uL — ABNORMAL LOW (ref 3.70–5.45)
RDW: 15.3 % — AB (ref 11.2–14.5)
WBC: 6.2 10*3/uL (ref 3.9–10.3)
lymph#: 1.2 10*3/uL (ref 0.9–3.3)

## 2015-04-08 LAB — CEA: CEA: 1176.8 ng/mL — AB (ref 0.0–5.0)

## 2015-04-08 MED ORDER — SODIUM CHLORIDE 0.9 % IJ SOLN
10.0000 mL | INTRAMUSCULAR | Status: DC | PRN
  Filled 2015-04-08: qty 10

## 2015-04-08 MED ORDER — OXALIPLATIN CHEMO INJECTION 100 MG/20ML
80.0000 mg/m2 | Freq: Once | INTRAVENOUS | Status: AC
  Administered 2015-04-08: 200 mg via INTRAVENOUS
  Filled 2015-04-08: qty 40

## 2015-04-08 MED ORDER — FLUOROURACIL CHEMO INJECTION 5 GM/100ML
2400.0000 mg/m2 | INTRAVENOUS | Status: DC
  Administered 2015-04-08: 5950 mg via INTRAVENOUS
  Filled 2015-04-08: qty 119

## 2015-04-08 MED ORDER — SODIUM CHLORIDE 0.9 % IV SOLN
5.0000 mg/kg | Freq: Once | INTRAVENOUS | Status: AC
  Administered 2015-04-08: 600 mg via INTRAVENOUS
  Filled 2015-04-08: qty 20

## 2015-04-08 MED ORDER — HEPARIN SOD (PORK) LOCK FLUSH 100 UNIT/ML IV SOLN
500.0000 [IU] | Freq: Once | INTRAVENOUS | Status: DC | PRN
  Filled 2015-04-08: qty 5

## 2015-04-08 MED ORDER — LEUCOVORIN CALCIUM INJECTION 350 MG
400.0000 mg/m2 | Freq: Once | INTRAVENOUS | Status: AC
  Administered 2015-04-08: 992 mg via INTRAVENOUS
  Filled 2015-04-08: qty 49.6

## 2015-04-08 MED ORDER — SODIUM CHLORIDE 0.9 % IV SOLN
Freq: Once | INTRAVENOUS | Status: AC
  Administered 2015-04-08: 12:00:00 via INTRAVENOUS

## 2015-04-08 MED ORDER — FLUOROURACIL CHEMO INJECTION 2.5 GM/50ML
400.0000 mg/m2 | Freq: Once | INTRAVENOUS | Status: AC
  Administered 2015-04-08: 1000 mg via INTRAVENOUS
  Filled 2015-04-08: qty 20

## 2015-04-08 MED ORDER — DEXTROSE 5 % IV SOLN
Freq: Once | INTRAVENOUS | Status: AC
  Administered 2015-04-08: 13:00:00 via INTRAVENOUS

## 2015-04-08 MED ORDER — SODIUM CHLORIDE 0.9 % IV SOLN
Freq: Once | INTRAVENOUS | Status: AC
  Administered 2015-04-08: 12:00:00 via INTRAVENOUS
  Filled 2015-04-08: qty 4

## 2015-04-08 MED ORDER — SODIUM CHLORIDE 0.9 % IJ SOLN
10.0000 mL | INTRAMUSCULAR | Status: DC | PRN
  Administered 2015-04-08: 10 mL via INTRAVENOUS
  Filled 2015-04-08: qty 10

## 2015-04-08 NOTE — Patient Instructions (Signed)

## 2015-04-08 NOTE — Progress Notes (Signed)
[facility] OFFICE PROGRESS NOTE   Diagnosis: Colon cancer  INTERVAL HISTORY:   [name] returns as scheduled. Tolerated her first cycle of FOLFOX/Avastin well. He reports some mild nausea without vomiting that has now resolved. She had some intermittent loose stools following her chemotherapy, but this is now resolved as well. Reports old injury to her bilateral knees and has had some increased swelling to her right knee for the past 2-3 weeks. She is using a right knee brace.  Objective:  Vital signs in last 24 hours:  Blood pressure 148/87, pulse 82, temperature 98.2 F (36.8 C), temperature source Oral, resp. rate 18, height $RemoveBe'5\' 11"'acIsvdKgw$  (1.803 m), weight 266 lb 6.4 oz (120.838 kg), SpO2 100 %.    HEENT: Neck without mass Lymphatics: No cervical, supra-clavicular, axillary, or inguinal nodes Resp: Lungs clear bilaterally Cardio: Regular rate and rhythm GI: No hepatomegaly, nontender, no mass Vascular: No leg edema Neurologic: Moderate loss of vibratory sense at the fingertips bilaterally  Portacath/PICC-without erythema  Lab Results:  Lab Results  Component Value Date   WBC 6.2 04/08/2015   HGB 10.6* 04/08/2015   HCT 32.5* 04/08/2015   MCV 92.9 04/08/2015   PLT 226 04/08/2015   NEUTROABS 4.3 04/08/2015      Lab Results  Component Value Date   CEA 1162.0* 03/25/2015    Imaging:  No results found. images were reviewed  Medications: I have reviewed the patient's current medications.  Assessment/Plan: 1. Metastatic colon cancer (T4, N0, M1) adenocarcinoma of the sigmoid colon status post sigmoid colectomy and partial right hepatectomy 04/11/2012. Microsatellite stable. No loss of mismatch repair proteins. K-ras wild-type on the initial codon 12 and 13 testing. K-ras Q61L and APC mutations identified on extended testing. BRAF not mutated.  She completed cycle 1 CAPOX beginning 05/25/2012.   She completed cycle 8 CAPOX beginning 10/19/2012.   CEA  on 10/19/2012 normal at 0.8.   CEA 6.1 on 01/11/2013   Restaging CT on 01/11/2013 consistent with multiple new pulmonary metastases   Cycle 1 of FOLFIRI/Avastin 01/18/2013; cycle 2 02/01/2013,cycle 3 02/16/2013.   CEA 5.0 on 03/01/2013.   Cycle 4 FOLFIRI/Avastin 03/01/2013.   Cycle 5 FOLFIRI/Avastin 03/15/2013.   Restaging chest CT 03/29/2013 with possible slight decrease in size of a left upper lobe metastasis. Other pulmonary nodules stable.   Cycle 6 FOLFIRI/Avastin 03/29/2013   Cycle 7 of FOLFIRI/Avastin 04/12/2013.   CEA 6.3 05/03/2013.   Cycle 8 FOLFIRI/Avastin 05/03/2013.   Cycle 9 FOLFIRI/Avastin 05/17/2013.   Cycle 10 FOLFIRI/Avastin 05/31/2013.   CEA 10.6 on 06/19/2013.   CT chest 06/19/2013 with mild progression of bilateral pulmonary nodules/metastases.   Decision made to continue FOLFIRI/Avastin. She completed cycle 11 06/21/2013.   Cycle 12 FOLFIRI/Avastin 07/05/2013   Cycle 13 FOLFIRI/Avastin 07/19/2013   Cycle 14 FOLFIRI/Avastin 08/01/2013.   CT chest 08/28/2013 with increased in size of bilateral lung nodules.   PET scan 09/22/2013 with multiple hypermetabolic pulmonary nodules bilaterally. Nodules demonstrate progressive enlargement compared with prior CTs. No evidence of extrathoracic metastatic disease.   Status post SRS 2 lung nodules, completed 10/18/2013  CT 01/12/2014 with airspace disease surrounding treated pulmonary nodules, no new nodules  01/12/2014 CEA 0.9  CT chest 04/12/2014 with improvement in airspace disease surrounding treated pulmonary nodules, no new nodules  04/12/2014 CEA 8.2  07/03/2014 CEA 60.3  CT 07/03/2014 with masslike enlargement of 2 treated lung lesions  Cycle 1 Lonsurf 07/12/2014.  Cycle 2 Lonsurf 08/11/2014 or 08/12/2014  Cycle 3 Lonsurf 09/08/2014  Cycle 4 Lonsurf 10/06/2014  Restaging CT of the chest on 10/01/2014 with stable lung nodules  Cycle 5 Lonsurf 11/05/2014  Cycle  6 Lonsurf 12/03/2014  Restaging CTs of the chest, abdomen, and pelvis on 12/26/2014 with no evidence of disease progression, stable lung nodules  Cycle 7 Lonsurf 12/31/2014  Cycle 8 Lonsurf 01/28/2015  Elevated CEA 03/12/2015  CT 03/19/2015 with enlargement of bilateral lung masses and progressive bone metastases including an enlarging lesion at T6 with replacement of the vertebral body  Cycle 1 FOLFOX/Avastin 03/25/2015  Cycle 2 FOLFOX/Avastin 04/08/2015 2. Microcytic anemia, iron deficiency. Improved.  3. Status post Port-A-Cath placement 03/14/2012. X-ray showed the Port-A-Cath tip to be in the azygos vein. The left-sided Port-A-Cath was removed and a new right Port-A-Cath was placed on 05/23/2012. 4. 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 5. Hypertension-persistent on amlodipine. HCTZ added 12/26/2014 6. Delayed nausea following cycle 5 FOLFIRI/Avastin. Emend was added to the premedication regimen 7. Nausea secondary to Lonsurf-improved with Ativan     Disposition:  [name] appears well. She is tolerating the FOLFOX and Avastin well. Recommend that she proceed with cycle 2 as scheduled today.  She will return for an office visit and chemotherapy in 2 weeks.  Mikey Bussing, DNP, AGPCNP-BC, AOCNP  04/08/2015  2:58 PM

## 2015-04-09 ENCOUNTER — Telehealth: Payer: Self-pay | Admitting: *Deleted

## 2015-04-09 NOTE — Telephone Encounter (Signed)
error 

## 2015-04-09 NOTE — Telephone Encounter (Signed)
Called pateint and asked if she wanted to come see Dr. Lisbeth Renshaw tomorrow at 1pm instead of 430pm , she has a pump start/top appt at 130pm in Med Onc, left voice message to call me to see if 1pm would be better for her  2:58 PM

## 2015-04-10 ENCOUNTER — Ambulatory Visit (HOSPITAL_BASED_OUTPATIENT_CLINIC_OR_DEPARTMENT_OTHER): Payer: Medicare Other

## 2015-04-10 ENCOUNTER — Ambulatory Visit
Admission: RE | Admit: 2015-04-10 | Discharge: 2015-04-10 | Disposition: A | Discharge status: Home / Self Care | Payer: Medicare Other | Source: Ambulatory Visit | Attending: Radiation Oncology | Admitting: Radiation Oncology

## 2015-04-10 VITALS — BP 136/83 | HR 83 | Temp 97.9°F | Resp 18

## 2015-04-10 DIAGNOSIS — Z452 Encounter for adjustment and management of vascular access device: Secondary | ICD-10-CM

## 2015-04-10 DIAGNOSIS — C189 Malignant neoplasm of colon, unspecified: Secondary | ICD-10-CM

## 2015-04-10 DIAGNOSIS — C187 Malignant neoplasm of sigmoid colon: Secondary | ICD-10-CM

## 2015-04-10 DIAGNOSIS — C787 Secondary malignant neoplasm of liver and intrahepatic bile duct: Principal | ICD-10-CM

## 2015-04-10 MED ORDER — SODIUM CHLORIDE 0.9 % IJ SOLN
10.0000 mL | INTRAMUSCULAR | Status: DC | PRN
  Administered 2015-04-10: 10 mL
  Filled 2015-04-10: qty 10

## 2015-04-10 MED ORDER — HEPARIN SOD (PORK) LOCK FLUSH 100 UNIT/ML IV SOLN
500.0000 [IU] | Freq: Once | INTRAVENOUS | Status: AC | PRN
  Administered 2015-04-10: 500 [IU]
  Filled 2015-04-10: qty 5

## 2015-04-12 ENCOUNTER — Encounter: Payer: Self-pay | Admitting: *Deleted

## 2015-04-15 ENCOUNTER — Other Ambulatory Visit: Payer: Self-pay | Admitting: Oncology

## 2015-04-17 ENCOUNTER — Other Ambulatory Visit: Payer: Self-pay | Admitting: Oncology

## 2015-04-17 ENCOUNTER — Telehealth: Payer: Self-pay | Admitting: *Deleted

## 2015-04-17 ENCOUNTER — Ambulatory Visit
Admission: RE | Admit: 2015-04-17 | Discharge: 2015-04-17 | Disposition: A | Discharge status: Home / Self Care | Payer: Medicare Other | Source: Ambulatory Visit | Attending: Radiation Oncology | Admitting: Radiation Oncology

## 2015-04-17 NOTE — Telephone Encounter (Signed)
Called patient ,she stated she has been having diarrhea all day and too sick to come in has rescheduled  For January 2017, wished her a speedy recovery, she is taking imodium and wished her healthier  New Year and A Merry Christmas, patient said she was sorry she didn't get to See Dr. Lisbeth Renshaw today  2:07 PM

## 2015-04-21 ENCOUNTER — Other Ambulatory Visit: Payer: Self-pay | Admitting: Oncology

## 2015-04-23 ENCOUNTER — Telehealth: Payer: Self-pay | Admitting: *Deleted

## 2015-04-23 ENCOUNTER — Other Ambulatory Visit (HOSPITAL_BASED_OUTPATIENT_CLINIC_OR_DEPARTMENT_OTHER): Payer: Medicare Other

## 2015-04-23 ENCOUNTER — Ambulatory Visit (HOSPITAL_BASED_OUTPATIENT_CLINIC_OR_DEPARTMENT_OTHER): Payer: Medicare Other | Admitting: Oncology

## 2015-04-23 ENCOUNTER — Ambulatory Visit (HOSPITAL_BASED_OUTPATIENT_CLINIC_OR_DEPARTMENT_OTHER): Payer: Medicare Other

## 2015-04-23 ENCOUNTER — Encounter: Payer: Self-pay | Admitting: *Deleted

## 2015-04-23 VITALS — BP 139/75 | HR 78 | Resp 18

## 2015-04-23 VITALS — BP 144/92 | HR 89 | Temp 98.2°F | Resp 18 | Ht 71.0 in | Wt 266.7 lb

## 2015-04-23 DIAGNOSIS — C189 Malignant neoplasm of colon, unspecified: Secondary | ICD-10-CM | POA: Diagnosis present

## 2015-04-23 DIAGNOSIS — Z5112 Encounter for antineoplastic immunotherapy: Secondary | ICD-10-CM

## 2015-04-23 DIAGNOSIS — C7951 Secondary malignant neoplasm of bone: Secondary | ICD-10-CM

## 2015-04-23 DIAGNOSIS — Z86718 Personal history of other venous thrombosis and embolism: Secondary | ICD-10-CM | POA: Diagnosis not present

## 2015-04-23 DIAGNOSIS — C7801 Secondary malignant neoplasm of right lung: Secondary | ICD-10-CM | POA: Diagnosis not present

## 2015-04-23 DIAGNOSIS — G620 Drug-induced polyneuropathy: Secondary | ICD-10-CM | POA: Diagnosis not present

## 2015-04-23 DIAGNOSIS — Z7901 Long term (current) use of anticoagulants: Secondary | ICD-10-CM

## 2015-04-23 DIAGNOSIS — C187 Malignant neoplasm of sigmoid colon: Secondary | ICD-10-CM

## 2015-04-23 DIAGNOSIS — D509 Iron deficiency anemia, unspecified: Secondary | ICD-10-CM

## 2015-04-23 DIAGNOSIS — C7802 Secondary malignant neoplasm of left lung: Secondary | ICD-10-CM | POA: Diagnosis not present

## 2015-04-23 DIAGNOSIS — Z5111 Encounter for antineoplastic chemotherapy: Secondary | ICD-10-CM

## 2015-04-23 DIAGNOSIS — C787 Secondary malignant neoplasm of liver and intrahepatic bile duct: Principal | ICD-10-CM

## 2015-04-23 LAB — CBC WITH DIFFERENTIAL/PLATELET
BASO%: 1 % (ref 0.0–2.0)
BASOS ABS: 0 10*3/uL (ref 0.0–0.1)
EOS ABS: 0.2 10*3/uL (ref 0.0–0.5)
EOS%: 4.1 % (ref 0.0–7.0)
HCT: 35.6 % (ref 34.8–46.6)
HEMOGLOBIN: 11.5 g/dL — AB (ref 11.6–15.9)
LYMPH%: 26.1 % (ref 14.0–49.7)
MCH: 29.6 pg (ref 25.1–34.0)
MCHC: 32.2 g/dL (ref 31.5–36.0)
MCV: 91.9 fL (ref 79.5–101.0)
MONO#: 0.4 10*3/uL (ref 0.1–0.9)
MONO%: 9 % (ref 0.0–14.0)
NEUT#: 3 10*3/uL (ref 1.5–6.5)
NEUT%: 59.8 % (ref 38.4–76.8)
Platelets: 198 10*3/uL (ref 145–400)
RBC: 3.87 10*6/uL (ref 3.70–5.45)
RDW: 17.1 % — ABNORMAL HIGH (ref 11.2–14.5)
WBC: 5 10*3/uL (ref 3.9–10.3)
lymph#: 1.3 10*3/uL (ref 0.9–3.3)

## 2015-04-23 LAB — COMPREHENSIVE METABOLIC PANEL
ALBUMIN: 4.1 g/dL (ref 3.5–5.0)
ALK PHOS: 176 U/L — AB (ref 40–150)
ALT: 38 U/L (ref 0–55)
AST: 41 U/L — ABNORMAL HIGH (ref 5–34)
Anion Gap: 11 mEq/L (ref 3–11)
BUN: 15.4 mg/dL (ref 7.0–26.0)
CHLORIDE: 105 meq/L (ref 98–109)
CO2: 24 mEq/L (ref 22–29)
Calcium: 10.3 mg/dL (ref 8.4–10.4)
Creatinine: 0.9 mg/dL (ref 0.6–1.1)
EGFR: 79 mL/min/{1.73_m2} — AB (ref >90)
GLUCOSE: 99 mg/dL (ref 70–140)
POTASSIUM: 4.4 meq/L (ref 3.5–5.1)
SODIUM: 140 meq/L (ref 136–145)
Total Bilirubin: 0.44 mg/dL (ref 0.20–1.20)
Total Protein: 8.4 g/dL — ABNORMAL HIGH (ref 6.4–8.3)

## 2015-04-23 MED ORDER — HEPARIN SOD (PORK) LOCK FLUSH 100 UNIT/ML IV SOLN
500.0000 [IU] | Freq: Once | INTRAVENOUS | Status: DC | PRN
  Filled 2015-04-23: qty 5

## 2015-04-23 MED ORDER — SODIUM CHLORIDE 0.9 % IV SOLN
2400.0000 mg/m2 | INTRAVENOUS | Status: DC
  Administered 2015-04-23: 5950 mg via INTRAVENOUS
  Filled 2015-04-23: qty 119

## 2015-04-23 MED ORDER — DEXTROSE 5 % IV SOLN
Freq: Once | INTRAVENOUS | Status: AC
  Administered 2015-04-23: 14:00:00 via INTRAVENOUS

## 2015-04-23 MED ORDER — LEUCOVORIN CALCIUM INJECTION 100 MG
20.0000 mg/m2 | Freq: Once | INTRAMUSCULAR | Status: AC
  Administered 2015-04-23: 50 mg via INTRAVENOUS
  Filled 2015-04-23: qty 2.5

## 2015-04-23 MED ORDER — SODIUM CHLORIDE 0.9 % IV SOLN
Freq: Once | INTRAVENOUS | Status: AC
  Administered 2015-04-23: 13:00:00 via INTRAVENOUS

## 2015-04-23 MED ORDER — SODIUM CHLORIDE 0.9 % IJ SOLN
10.0000 mL | INTRAMUSCULAR | Status: DC | PRN
  Filled 2015-04-23: qty 10

## 2015-04-23 MED ORDER — FLUOROURACIL CHEMO INJECTION 2.5 GM/50ML
400.0000 mg/m2 | Freq: Once | INTRAVENOUS | Status: AC
  Administered 2015-04-23: 1000 mg via INTRAVENOUS
  Filled 2015-04-23: qty 20

## 2015-04-23 MED ORDER — SODIUM CHLORIDE 0.9 % IV SOLN
5.0000 mg/kg | Freq: Once | INTRAVENOUS | Status: AC
  Administered 2015-04-23: 600 mg via INTRAVENOUS
  Filled 2015-04-23: qty 16

## 2015-04-23 MED ORDER — SODIUM CHLORIDE 0.9 % IV SOLN
Freq: Once | INTRAVENOUS | Status: AC
  Administered 2015-04-23: 13:00:00 via INTRAVENOUS
  Filled 2015-04-23: qty 4

## 2015-04-23 MED ORDER — DEXTROSE 5 % IV SOLN
81.0000 mg/m2 | Freq: Once | INTRAVENOUS | Status: AC
  Administered 2015-04-23: 200 mg via INTRAVENOUS
  Filled 2015-04-23: qty 40

## 2015-04-23 NOTE — Progress Notes (Signed)
Oncology Nurse Navigator Documentation  Oncology Nurse Navigator Flowsheets 04/23/2015  Navigator Encounter Type Treatment  Patient Visit Type Medonc  Treatment Phase Treatment #3 FOLFOX/Avastin  Barriers/Navigation Needs No barriers at this time  Interventions Other-supportive listening  Time Spent with Patient 15  Reports good Christmas season. Feeling well, except her right knee pain (chronic). Takes Aleve at hs as needed. Talked briefly about her family and expresses sadness that a daughter and son of hers rarely come to see her. Says "I don't know what happened; I've been a good mom". Reassured her it is not her fault and she has no control over grown children. Just love them and pray for them and for herself.

## 2015-04-23 NOTE — Progress Notes (Signed)
[name] Work  Clinical Social Work was referred by patient for assessment of psychosocial needs.  Clinical Social Worker met with patient at [facility] prior to chemo offer support and assess for needs.  Pt reports she had a great time with her grandchildren for the holidays. Pt requesting SCAT pass to assist with getting to treatment, getting pump disconnected. CSW assisted pt with SCAT pass today. Pt denied other concerns and was very appreciative.   Clinical Social Work interventions: Resource assistance  [name], [place] Worker [place]  [place] Phone: [phone] Fax: [phone]

## 2015-04-23 NOTE — Patient Instructions (Signed)
[facility] Discharge Instructions for Patients Receiving Chemotherapy  Today you received the following chemotherapy agents: Avastin, Oxaliplatin, Leucovorin, and Adrucil.   To help prevent nausea and vomiting after your treatment, we encourage you to take your nausea medication as directed.   If you develop nausea and vomiting that is not controlled by your nausea medication, call the clinic.   BELOW ARE SYMPTOMS THAT SHOULD BE REPORTED IMMEDIATELY:  *FEVER GREATER THAN 100.5 F  *CHILLS WITH OR WITHOUT FEVER  NAUSEA AND VOMITING THAT IS NOT CONTROLLED WITH YOUR NAUSEA MEDICATION  *UNUSUAL SHORTNESS OF BREATH  *UNUSUAL BRUISING OR BLEEDING  TENDERNESS IN MOUTH AND THROAT WITH OR WITHOUT PRESENCE OF ULCERS  *URINARY PROBLEMS  *BOWEL PROBLEMS  UNUSUAL RASH Items with * indicate a potential emergency and should be followed up as soon as possible.  Feel free to call the clinic you have any questions or concerns. The clinic phone number is [phone].  Please show the [place] at check-in to the Emergency Department and triage nurse.

## 2015-04-23 NOTE — Progress Notes (Signed)
[place] OFFICE PROGRESS NOTE   Diagnosis: Colon cancer  INTERVAL HISTORY:   [name] returns as scheduled. She completed another cycle of FOLFOX 04/08/2015. She reports mild nausea following chemotherapy. No vomiting. Cold sensitivity lasted 3-4 days. She continues to have mild numbness in the extremities. This does not interfere with activity and has not changed since resuming FOLFOX. No diarrhea.  Objective:  Vital signs in last 24 hours:  Blood pressure 144/92, pulse 89, temperature 98.2 F (36.8 C), temperature source Oral, resp. rate 18, height _0  (1.803 m), weight 266 lb 11.2 oz (120.974 kg), SpO2 100 %.    HEENT: No thrush or ulcers Resp: Lungs clear bilaterally Cardio: Regular rate and rhythm GI: No hepatomegaly, nontender Vascular: No leg edema Neuro: Mild decrease in vibratory sense at the fingertips bilaterally  Skin: Mild hyperpigmentation of the hands   Portacath/PICC-without erythema  Lab Results:  Lab Results  Component Value Date   WBC 5.0 04/23/2015   HGB 11.5* 04/23/2015   HCT 35.6 04/23/2015   MCV 91.9 04/23/2015   PLT 198 04/23/2015   NEUTROABS 3.0 04/23/2015      Lab Results  Component Value Date   CEA 1176.8* 04/08/2015    Medications: I have reviewed the patient's current medications.  Assessment/Plan: 1. Metastatic colon cancer (T4, N0, M1) adenocarcinoma of the sigmoid colon status post sigmoid colectomy and partial right hepatectomy 04/11/2012. Microsatellite stable. No loss of mismatch repair proteins. K-ras wild-type on the initial codon 12 and 13 testing. K-ras Q61L and APC mutations identified on extended testing. BRAF not mutated.  She completed cycle 1 CAPOX beginning 05/25/2012.   She completed cycle 8 CAPOX beginning 10/19/2012.   CEA on 10/19/2012 normal at 0.8.   CEA 6.1 on 01/11/2013   Restaging CT on 01/11/2013 consistent with multiple new pulmonary metastases   Cycle 1 of FOLFIRI/Avastin  01/18/2013; cycle 2 02/01/2013,cycle 3 02/16/2013.   CEA 5.0 on 03/01/2013.   Cycle 4 FOLFIRI/Avastin 03/01/2013.   Cycle 5 FOLFIRI/Avastin 03/15/2013.   Restaging chest CT 03/29/2013 with possible slight decrease in size of a left upper lobe metastasis. Other pulmonary nodules stable.   Cycle 6 FOLFIRI/Avastin 03/29/2013   Cycle 7 of FOLFIRI/Avastin 04/12/2013.   CEA 6.3 05/03/2013.   Cycle 8 FOLFIRI/Avastin 05/03/2013.   Cycle 9 FOLFIRI/Avastin 05/17/2013.   Cycle 10 FOLFIRI/Avastin 05/31/2013.   CEA 10.6 on 06/19/2013.   CT chest 06/19/2013 with mild progression of bilateral pulmonary nodules/metastases.   Decision made to continue FOLFIRI/Avastin. She completed cycle 11 06/21/2013.   Cycle 12 FOLFIRI/Avastin 07/05/2013   Cycle 13 FOLFIRI/Avastin 07/19/2013   Cycle 14 FOLFIRI/Avastin 08/01/2013.   CT chest 08/28/2013 with increased in size of bilateral lung nodules.   PET scan 09/22/2013 with multiple hypermetabolic pulmonary nodules bilaterally. Nodules demonstrate progressive enlargement compared with prior CTs. No evidence of extrathoracic metastatic disease.   Status post SRS 2 lung nodules, completed 10/18/2013  CT 01/12/2014 with airspace disease surrounding treated pulmonary nodules, no new nodules  01/12/2014 CEA 0.9  CT chest 04/12/2014 with improvement in airspace disease surrounding treated pulmonary nodules, no new nodules  04/12/2014 CEA 8.2  07/03/2014 CEA 60.3  CT 07/03/2014 with masslike enlargement of 2 treated lung lesions  Cycle 1 Lonsurf 07/12/2014.  Cycle 2 Lonsurf 08/11/2014 or 08/12/2014  Cycle 3 Lonsurf 09/08/2014  Cycle 4 Lonsurf 10/06/2014  Restaging CT of the chest on 10/01/2014 with stable lung nodules  Cycle 5 Lonsurf 11/05/2014  Cycle 6 Lonsurf 12/03/2014  Restaging CTs  of the chest, abdomen, and pelvis on 12/26/2014 with no evidence of disease progression, stable lung nodules  Cycle 7 Lonsurf  12/31/2014  Cycle 8 Lonsurf 01/28/2015  Elevated CEA 03/12/2015  CT 03/19/2015 with enlargement of bilateral lung masses and progressive bone metastases including an enlarging lesion at T6 with replacement of the vertebral body  Cycle 1 FOLFOX/Avastin 03/25/2015  Cycle 2 FOLFOX/Avastin 04/08/2015  Cycle 3 FOLFOX/Avastin 04/23/2015 2. Microcytic anemia, iron deficiency. Improved.  3. Status post Port-A-Cath placement 03/14/2012. X-ray showed the Port-A-Cath tip to be in the azygos vein. The left-sided Port-A-Cath was removed and a new right Port-A-Cath was placed on 05/23/2012. 4. 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 5. Hypertension-persistent on amlodipine. HCTZ added 12/26/2014 6. Delayed nausea following cycle 5 FOLFIRI/Avastin. Emend was added to the premedication regimen 7. Nausea secondary to Lonsurf-improved with Ativan 8. Mild oxaliplatin neuropathy    Disposition:  [name] appears stable. The plan is to complete cycle 3 FOLFOX/Avastin today. She will return for an office visit and chemotherapy in 2 weeks. We will follow-up on the CEA from today. We will decide on obtaining a restaging CT based on the CEA with cycle 4.  Betsy Coder, MD  04/23/2015  12:33 PM

## 2015-04-23 NOTE — Telephone Encounter (Signed)
Per staff message and POF I have scheduled appts. Advised scheduler of appts. JMW  

## 2015-04-23 NOTE — Progress Notes (Signed)
Blood return noted before and after Leucovorin and adrucil push.

## 2015-04-24 LAB — CEA: CEA: 1099 ng/mL — AB (ref 0.0–5.0)

## 2015-04-25 ENCOUNTER — Ambulatory Visit (HOSPITAL_BASED_OUTPATIENT_CLINIC_OR_DEPARTMENT_OTHER): Payer: Medicare Other

## 2015-04-25 VITALS — BP 155/89 | HR 82 | Temp 98.1°F | Resp 19

## 2015-04-25 DIAGNOSIS — C189 Malignant neoplasm of colon, unspecified: Secondary | ICD-10-CM

## 2015-04-25 DIAGNOSIS — R197 Diarrhea, unspecified: Secondary | ICD-10-CM

## 2015-04-25 DIAGNOSIS — C787 Secondary malignant neoplasm of liver and intrahepatic bile duct: Secondary | ICD-10-CM

## 2015-04-25 MED ORDER — SODIUM CHLORIDE 0.9 % IJ SOLN
10.0000 mL | INTRAMUSCULAR | Status: DC | PRN
  Administered 2015-04-25: 10 mL
  Filled 2015-04-25: qty 10

## 2015-04-25 MED ORDER — HEPARIN SOD (PORK) LOCK FLUSH 100 UNIT/ML IV SOLN
500.0000 [IU] | Freq: Once | INTRAVENOUS | Status: AC | PRN
Start: 2015-04-25 — End: 2015-04-25
  Administered 2015-04-25: 500 [IU]
  Filled 2015-04-25: qty 5

## 2015-04-25 MED ORDER — LOPERAMIDE HCL 2 MG PO CAPS
ORAL_CAPSULE | ORAL | Status: AC
  Filled 2015-04-25: qty 2

## 2015-04-25 MED ORDER — SODIUM CHLORIDE 0.9 % IV SOLN
Freq: Once | INTRAVENOUS | Status: AC
  Administered 2015-04-25: 15:00:00 via INTRAVENOUS

## 2015-04-25 MED ORDER — LOPERAMIDE HCL 2 MG PO CAPS
4.0000 mg | ORAL_CAPSULE | Freq: Once | ORAL | Status: AC
  Administered 2015-04-25: 4 mg via ORAL

## 2015-04-25 NOTE — Progress Notes (Signed)
Patient presented to [facility] for pump d/c; Kim from lab came to infusion to alert nursing that patient is not feeling well. Upon assessment, patient

## 2015-04-25 NOTE — Patient Instructions (Signed)

## 2015-05-01 ENCOUNTER — Encounter: Payer: Self-pay | Admitting: Internal Medicine

## 2015-05-01 ENCOUNTER — Ambulatory Visit (INDEPENDENT_AMBULATORY_CARE_PROVIDER_SITE_OTHER): Payer: Medicare Other | Admitting: Internal Medicine

## 2015-05-01 VITALS — BP 145/90 | HR 88 | Temp 98.5°F | Ht 71.0 in | Wt 265.6 lb

## 2015-05-01 DIAGNOSIS — R1030 Lower abdominal pain, unspecified: Secondary | ICD-10-CM

## 2015-05-01 DIAGNOSIS — I10 Essential (primary) hypertension: Secondary | ICD-10-CM | POA: Diagnosis present

## 2015-05-01 DIAGNOSIS — C787 Secondary malignant neoplasm of liver and intrahepatic bile duct: Secondary | ICD-10-CM | POA: Diagnosis not present

## 2015-05-01 DIAGNOSIS — C189 Malignant neoplasm of colon, unspecified: Secondary | ICD-10-CM | POA: Diagnosis not present

## 2015-05-01 DIAGNOSIS — E611 Iron deficiency: Secondary | ICD-10-CM | POA: Diagnosis not present

## 2015-05-01 MED ORDER — DICLOFENAC SODIUM 1 % TD GEL: 2.0000 g | Freq: Four times a day (QID) | TRANSDERMAL | Status: DC

## 2015-05-01 NOTE — Assessment & Plan Note (Signed)
Patient reports 1 month h/o right groin pain described as "sore," that then progressed to right knee swelling and pain.  She then intermittently experienced similar symptoms in left hip/groin, but her hip/groin pain resolved about 2 weeks ago.  She could continue to walk during that time.  However, she now has continued bilateral thigh pain.  She describes the pain as "hurts to walk."  The pain is worse at night and relieved with squeezing/massage of anterior thigh.  She has not noticed any change with Aleve.  She has a h/o OA of bilateral knees, with similar thigh pain during a bad flair back in 2013.  At that time, patient benefited from Menthol cream applied topically to knees/thighs.   She has colon cancer with metastases to lung, liver, and bone.  Her ALP has recently increased.  Her CEA has remained relatively stable.  She is currently on FOLFOX therapy, after receiving CAPOX, FOLFIRI and Avastin, and Lonsurf.   On exam, pain replicated with flexion and internal and external rotation of the hip.  A/P: Unclear etiology.  Given h/o OA, this is possibly hip OA with nerve impingement, worsened by her obesity.  As she has metastatic cancer with bony mets, there is concern for new met to hip or femur.  Will provide topical pain relief and acquire Xrays of femur/hip. - Voltaren gel AAA QID PRN - B/l hip xrays - B/l femur xrays - Return to clinic in 1 week, with escalation to narcotic pain medications if necessary. - Discuss end of life goals at next visit.

## 2015-05-01 NOTE — Assessment & Plan Note (Signed)
BP Readings from Last 3 Encounters:  05/01/15 147/120  04/25/15 155/89  04/23/15 139/75    Lab Results  Component Value Date   NA 140 04/23/2015   K 4.4 04/23/2015   CREATININE 0.9 04/23/2015    Assessment: Blood pressure control:  controlled Progress toward BP goal:   stable Comments: Patient had not taken her Amlodipine 10 mg prior to checking BP.  Recheck was 145/90.  She was previously taking HCTZ 12.5 daily as well, which patient discontinued 2/2 headache.  As patient has metastatic colon cancer and age > 61 yo, I am comfortable with keeping her on Amlodipine 10 mg for BP control, with goal <150/90.  However, should BP remain slightly above goal, increased BP control is unlikely to change her longterm prognosis.  Plan: Medications:  continue current medications, Amlodipine 10 mg daily Educational resources provided: brochure Self management tools provided: home blood pressure logbook Other plans: recheck 1 week

## 2015-05-01 NOTE — Progress Notes (Signed)
Internal Medicine Clinic Attending  Case discussed with Dr. Taylor at the time of the visit.  We reviewed the resident's history and exam and pertinent patient test results.  I agree with the assessment, diagnosis, and plan of care documented in the resident's note. 

## 2015-05-01 NOTE — Progress Notes (Signed)
Patient ID: [name], female   DOB: [birth year], 60 y.o.   MRN: [id number]    Subjective:   Patient ID: [name] female   DOB: 29-Jan-1956 60 y.o.   MRN: [id number]  HPI: [name] is a 60 y.o. female with PMH as below, here to reestablish care with the clinic and for evaluation of leg pain and HTN.    Please see Problem-Based charting for the status of the patient's chronic medical issues.    Past Medical History  Diagnosis Date  . Hypertension     pt states she stopped taking meds while in her 20's  . Blood in stool   . History of blood transfusion   . Anemia   . Eczema   . Heart murmur     does not see a cardiologist has been told she does has a murmur and also that she does not have a murmur  . Anxiety   . Neuromuscular disorder (HCC)     numbness feet  . Hx of radiation therapy 10/06/13,16th,19th,22nd,&10/18/13    SBRT lung  . Cancer ([place])   . Metastatic colon cancer to liver [facility])    Current Outpatient Prescriptions  Medication Sig Dispense Refill  . amLODipine (NORVASC) 10 MG tablet TAKE 1 TABLET BY MOUTH EVERY DAY 90 tablet 0  . hydrochlorothiazide (MICROZIDE) 12.5 MG capsule Take 1 capsule (12.5 mg total) by mouth daily. 30 capsule 1  . lidocaine-prilocaine (EMLA) cream Apply topically as needed. Apply to [facility] site 1-2 hours prior to stick and cover with plastic wrap to numb site 30 g 3  . loperamide (IMODIUM) 2 MG capsule Take 1 capsule (2 mg total) by mouth as needed for diarrhea or loose stools (max 8 tabs per day). 30 capsule 0  . LORazepam (ATIVAN) 0.5 MG tablet Place 1 tablet (0.5 mg total) under the tongue every 6 (six) hours as needed (for nausea). Causes drowsiness 30 tablet 0  . Multiple Vitamins-Minerals (MULTIVITAMIN) tablet Take 1 tablet by mouth daily.    . ondansetron (ZOFRAN) 8 MG tablet TAKE 1 TABLET BY MOUTH EVERY 8 HOURS AS NEEDED FOR NAUSEA AND VOMITING 20 tablet 2  . polyethylene glycol powder (MIRALAX) powder Take 17 g by mouth daily as  needed. 255 g 0  . potassium chloride SA (K-DUR,KLOR-CON) 20 MEQ tablet TAKE 1 TABLET BY MOUTH EVERY DAY 90 tablet 0  . prochlorperazine (COMPAZINE) 10 MG tablet TAKE 1 TABLET BY MOUTH EVERY 6 HOURS AS NEEDED FOR NAUSEA OR VOMITING 30 tablet 1  . XARELTO 20 MG TABS tablet TAKE 1 TABLET BY MOUTH EVERY DAY 30 tablet 0   No current facility-administered medications for this visit.   Family History  Problem Relation Age of Onset  . Hypertension Mother   . Cancer Mother 51    breast ca  . Heart disease Father    Social History   Social History  . Marital Status: Single    Spouse Name: N/A  . Number of Children: N/A  . Years of Education: N/A   Social History Main Topics  . Smoking status: Never Smoker   . Smokeless tobacco: Never Used  . Alcohol Use: No  . Drug Use: No  . Sexual Activity: Not Currently   Other Topics Concern  . Not on file   Social History Narrative   Review of Systems: A comprehensive review of systems was negative except for: hair breakage, nausea, and diarrhea 2/2 her chemotherapy.   No fever, chills, CP, SOB, hip/adbominal/vaginal  pain, vagina discharge, dysuria, blurry vision, HA, nose bleeds, vomiting, or constipation.  Objective:  Physical Exam: There were no vitals filed for this visit. Physical Exam  Constitutional: She is oriented to person, place, and time and well-developed, well-nourished, and in no distress. No distress.  HENT:  Head: Normocephalic and atraumatic.  Eyes: EOM are normal. Pupils are equal, round, and reactive to light. No scleral icterus.  Neck: No JVD present. No tracheal deviation present.  Cardiovascular: Normal rate, regular rhythm and intact distal pulses.   Grade II/VI systolic crescendo-decrescendo murmur heard best at RUSB.  Pulmonary/Chest: Effort normal and breath sounds normal. No stridor. No respiratory distress. She has no wheezes.  Abdominal: Soft. She exhibits no distension. There is no tenderness. There is no  rebound and no guarding.  Musculoskeletal: She exhibits no edema.  Pain on anterior thigh with bilateral straight leg raise and internal and external rotation of hip.  Neurological: She is alert and oriented to person, place, and time.  Gait slow and waddling.   Skin: Skin is warm and dry. No rash noted. She is not diaphoretic. No erythema.     Assessment & Plan:   Patient and case were discussed with Dr. Lynnae January.  Please refer to Problem Based charting for further documentation.

## 2015-05-01 NOTE — Patient Instructions (Signed)
1. Apply Voltaren Gel to affected area four times a day. 2. Alternate Aleve and Tylenol for added benefit. 3. We will get Hip and Femur Xrays. 4. Return to clinic in 1 week.  Osteoarthritis Osteoarthritis is a disease that causes soreness and inflammation of a joint. It occurs when the cartilage at the affected joint wears down. Cartilage acts as a cushion, covering the ends of bones where they meet to form a joint. Osteoarthritis is the most common form of arthritis. It often occurs in older people. The joints affected most often by this condition include those in the:  Ends of the fingers.  Thumbs.  Neck.  Lower back.  Knees.  Hips. CAUSES  Over time, the cartilage that covers the ends of bones begins to wear away. This causes bone to rub on bone, producing pain and stiffness in the affected joints.  RISK FACTORS Certain factors can increase your chances of having osteoarthritis, including:  Older age.  Excessive body weight.  Overuse of joints.  Previous joint injury. SIGNS AND SYMPTOMS   Pain, swelling, and stiffness in the joint.  Over time, the joint may lose its normal shape.  Small deposits of bone (osteophytes) may grow on the edges of the joint.  Bits of bone or cartilage can break off and float inside the joint space. This may cause more pain and damage. DIAGNOSIS  Your health care provider will do a physical exam and ask about your symptoms. Various tests may be ordered, such as:  X-rays of the affected joint.  Blood tests to rule out other types of arthritis. Additional tests may be used to diagnose your condition. TREATMENT  Goals of treatment are to control pain and improve joint function. Treatment plans may include:  A prescribed exercise program that allows for rest and joint relief.  A weight control plan.  Pain relief techniques, such as:  Properly applied heat and cold.  Electric pulses delivered to nerve endings under the skin  (transcutaneous electrical nerve stimulation [TENS]).  Massage.  Certain nutritional supplements.  Medicines to control pain, such as:  Acetaminophen.  Nonsteroidal anti-inflammatory drugs (NSAIDs), such as naproxen.  Narcotic or central-acting agents, such as tramadol.  Corticosteroids. These can be given orally or as an injection.  Surgery to reposition the bones and relieve pain (osteotomy) or to remove loose pieces of bone and cartilage. Joint replacement may be needed in advanced states of osteoarthritis. HOME CARE INSTRUCTIONS   Take medicines only as directed by your health care provider.  Maintain a healthy weight. Follow your health care provider's instructions for weight control. This may include dietary instructions.  Exercise as directed. Your health care provider can recommend specific types of exercise. These may include:  Strengthening exercises. These are done to strengthen the muscles that support joints affected by arthritis. They can be performed with weights or with exercise bands to add resistance.  Aerobic activities. These are exercises, such as brisk walking or low-impact aerobics, that get your heart pumping.  Range-of-motion activities. These keep your joints limber.  Balance and agility exercises. These help you maintain daily living skills.  Rest your affected joints as directed by your health care provider.  Keep all follow-up visits as directed by your health care provider. SEEK MEDICAL CARE IF:   Your skin turns red.  You develop a rash in addition to your joint pain.  You have worsening joint pain.  You have a fever along with joint or muscle aches. [place]  IF:  You have a significant loss of weight or appetite.  You have night sweats. [place] of Arthritis and Musculoskeletal and Skin Diseases: www.niams.SouthExposed.es  [employer] on Aging: http://kim-miller.com/  American College  of Rheumatology: www.rheumatology.org   This information is not intended to replace advice given to you by your health care provider. Make sure you discuss any questions you have with your health care provider.   Document Released: 04/13/2005 Document Revised: 05/04/2014 Document Reviewed: 12/19/2012 Elsevier Interactive Patient Education [employer].

## 2015-05-02 ENCOUNTER — Telehealth: Payer: Self-pay | Admitting: Oncology

## 2015-05-02 NOTE — Telephone Encounter (Signed)
Spoke with patient and confirmed next appointment for 1/11.

## 2015-05-05 ENCOUNTER — Other Ambulatory Visit: Payer: Self-pay | Admitting: Oncology

## 2015-05-08 ENCOUNTER — Encounter: Payer: Self-pay | Admitting: Radiation Oncology

## 2015-05-08 ENCOUNTER — Telehealth: Payer: Self-pay | Admitting: *Deleted

## 2015-05-08 ENCOUNTER — Ambulatory Visit (HOSPITAL_BASED_OUTPATIENT_CLINIC_OR_DEPARTMENT_OTHER): Payer: Medicare Other | Admitting: Nurse Practitioner

## 2015-05-08 ENCOUNTER — Telehealth: Payer: Self-pay | Admitting: Oncology

## 2015-05-08 ENCOUNTER — Ambulatory Visit (HOSPITAL_BASED_OUTPATIENT_CLINIC_OR_DEPARTMENT_OTHER): Payer: Medicare Other

## 2015-05-08 ENCOUNTER — Ambulatory Visit: Payer: Medicare Other | Admitting: Radiation Oncology

## 2015-05-08 ENCOUNTER — Ambulatory Visit
Admission: RE | Admit: 2015-05-08 | Discharge: 2015-05-08 | Disposition: A | Discharge status: Home / Self Care | Payer: Medicare Other | Source: Ambulatory Visit | Attending: Radiation Oncology | Admitting: Radiation Oncology

## 2015-05-08 ENCOUNTER — Encounter: Payer: Self-pay | Admitting: Nurse Practitioner

## 2015-05-08 ENCOUNTER — Other Ambulatory Visit (HOSPITAL_BASED_OUTPATIENT_CLINIC_OR_DEPARTMENT_OTHER): Payer: Medicare Other

## 2015-05-08 VITALS — BP 144/67 | HR 80 | Resp 18

## 2015-05-08 VITALS — BP 159/94 | HR 84 | Temp 98.3°F | Resp 18 | Ht 71.0 in | Wt 266.0 lb

## 2015-05-08 VITALS — BP 154/80 | HR 106 | Temp 97.7°F | Resp 20 | Ht 71.0 in | Wt 267.8 lb

## 2015-05-08 DIAGNOSIS — G620 Drug-induced polyneuropathy: Secondary | ICD-10-CM

## 2015-05-08 DIAGNOSIS — C7800 Secondary malignant neoplasm of unspecified lung: Secondary | ICD-10-CM | POA: Diagnosis not present

## 2015-05-08 DIAGNOSIS — Z5112 Encounter for antineoplastic immunotherapy: Secondary | ICD-10-CM

## 2015-05-08 DIAGNOSIS — Z5111 Encounter for antineoplastic chemotherapy: Secondary | ICD-10-CM

## 2015-05-08 DIAGNOSIS — C787 Secondary malignant neoplasm of liver and intrahepatic bile duct: Secondary | ICD-10-CM

## 2015-05-08 DIAGNOSIS — C7951 Secondary malignant neoplasm of bone: Secondary | ICD-10-CM

## 2015-05-08 DIAGNOSIS — D509 Iron deficiency anemia, unspecified: Secondary | ICD-10-CM | POA: Diagnosis not present

## 2015-05-08 DIAGNOSIS — C189 Malignant neoplasm of colon, unspecified: Secondary | ICD-10-CM

## 2015-05-08 DIAGNOSIS — C187 Malignant neoplasm of sigmoid colon: Secondary | ICD-10-CM | POA: Diagnosis present

## 2015-05-08 LAB — COMPREHENSIVE METABOLIC PANEL
ALT: 41 U/L (ref 0–55)
ANION GAP: 11 meq/L (ref 3–11)
AST: 48 U/L — AB (ref 5–34)
Albumin: 4.2 g/dL (ref 3.5–5.0)
Alkaline Phosphatase: 192 U/L — ABNORMAL HIGH (ref 40–150)
BUN: 13.6 mg/dL (ref 7.0–26.0)
CHLORIDE: 105 meq/L (ref 98–109)
CO2: 24 meq/L (ref 22–29)
CREATININE: 1 mg/dL (ref 0.6–1.1)
Calcium: 10.3 mg/dL (ref 8.4–10.4)
EGFR: 74 mL/min/{1.73_m2} — ABNORMAL LOW (ref >90)
GLUCOSE: 100 mg/dL (ref 70–140)
Potassium: 3.9 mEq/L (ref 3.5–5.1)
SODIUM: 140 meq/L (ref 136–145)
TOTAL PROTEIN: 8.5 g/dL — AB (ref 6.4–8.3)
Total Bilirubin: 0.64 mg/dL (ref 0.20–1.20)

## 2015-05-08 LAB — CBC WITH DIFFERENTIAL/PLATELET
BASO%: 0.9 % (ref 0.0–2.0)
Basophils Absolute: 0 10*3/uL (ref 0.0–0.1)
EOS%: 2 % (ref 0.0–7.0)
Eosinophils Absolute: 0.1 10*3/uL (ref 0.0–0.5)
HCT: 36 % (ref 34.8–46.6)
HGB: 11.7 g/dL (ref 11.6–15.9)
LYMPH%: 33.3 % (ref 14.0–49.7)
MCH: 29.8 pg (ref 25.1–34.0)
MCHC: 32.5 g/dL (ref 31.5–36.0)
MCV: 91.7 fL (ref 79.5–101.0)
MONO#: 0.4 10*3/uL (ref 0.1–0.9)
MONO%: 9.7 % (ref 0.0–14.0)
NEUT%: 54.1 % (ref 38.4–76.8)
NEUTROS ABS: 2.4 10*3/uL (ref 1.5–6.5)
PLATELETS: 222 10*3/uL (ref 145–400)
RBC: 3.92 10*6/uL (ref 3.70–5.45)
RDW: 17.2 % — ABNORMAL HIGH (ref 11.2–14.5)
WBC: 4.5 10*3/uL (ref 3.9–10.3)
lymph#: 1.5 10*3/uL (ref 0.9–3.3)

## 2015-05-08 LAB — UA PROTEIN, DIPSTICK - CHCC: Protein, ur: 30 mg/dL

## 2015-05-08 MED ORDER — LORAZEPAM 0.5 MG PO TABS: 0.5000 mg | ORAL_TABLET | Freq: Four times a day (QID) | ORAL | Status: AC | PRN

## 2015-05-08 MED ORDER — LEUCOVORIN CALCIUM INJECTION 100 MG
20.0000 mg/m2 | Freq: Once | INTRAMUSCULAR | Status: AC
  Administered 2015-05-08: 50 mg via INTRAVENOUS
  Filled 2015-05-08: qty 2.5

## 2015-05-08 MED ORDER — SODIUM CHLORIDE 0.9 % IV SOLN
Freq: Once | INTRAVENOUS | Status: AC
  Administered 2015-05-08: 13:00:00 via INTRAVENOUS

## 2015-05-08 MED ORDER — HEPARIN SOD (PORK) LOCK FLUSH 100 UNIT/ML IV SOLN
500.0000 [IU] | Freq: Once | INTRAVENOUS | Status: DC | PRN
  Filled 2015-05-08: qty 5

## 2015-05-08 MED ORDER — SODIUM CHLORIDE 0.9 % IJ SOLN
10.0000 mL | INTRAMUSCULAR | Status: DC | PRN
  Filled 2015-05-08: qty 10

## 2015-05-08 MED ORDER — DEXTROSE 5 % IV SOLN
Freq: Once | INTRAVENOUS | Status: AC
  Administered 2015-05-08: 14:00:00 via INTRAVENOUS

## 2015-05-08 MED ORDER — FLUOROURACIL CHEMO INJECTION 2.5 GM/50ML
400.0000 mg/m2 | Freq: Once | INTRAVENOUS | Status: AC
  Administered 2015-05-08: 1000 mg via INTRAVENOUS
  Filled 2015-05-08: qty 20

## 2015-05-08 MED ORDER — SODIUM CHLORIDE 0.9 % IV SOLN
Freq: Once | INTRAVENOUS | Status: AC
  Administered 2015-05-08: 13:00:00 via INTRAVENOUS
  Filled 2015-05-08: qty 4

## 2015-05-08 MED ORDER — OXALIPLATIN CHEMO INJECTION 100 MG/20ML
80.0000 mg/m2 | Freq: Once | INTRAVENOUS | Status: AC
  Administered 2015-05-08: 200 mg via INTRAVENOUS
  Filled 2015-05-08: qty 40

## 2015-05-08 MED ORDER — SODIUM CHLORIDE 0.9 % IV SOLN
5.0000 mg/kg | Freq: Once | INTRAVENOUS | Status: AC
  Administered 2015-05-08: 600 mg via INTRAVENOUS
  Filled 2015-05-08: qty 20

## 2015-05-08 MED ORDER — SODIUM CHLORIDE 0.9 % IV SOLN
2400.0000 mg/m2 | INTRAVENOUS | Status: DC
  Administered 2015-05-08: 5950 mg via INTRAVENOUS
  Filled 2015-05-08: qty 119

## 2015-05-08 NOTE — Progress Notes (Signed)
Radiation Oncology         [phone]) [phone] ________________________________  Name: Ahrianna Siglin MRN: [id number]  Date: 05/08/2015  DOB: 12-Jul-1955  Follow-Up Visit Note  CC: No PCP Per Patient  Ladell Pier, MD  Diagnosis:   [name] is a 60 year old female presenting to clinic in regards to her metastatic colon cancer with pulmonary metastases.   Interval Since Last Radiation:  The patient completed radiation treatment to the lung on 10/18/2013   Narrative:  Follow up s/p radiation Lung completed 10/06/13, no coughing, pain in right hip and knee 7/10 scale, she states she s. Pain is manageable with Voltaren. only takes aleve prn, has lab today at 10:45 and chemotherapy and visit with Lattie Haw Thomas/med/onc, appetite good, no nausea at present, energy level ok, says she went to Dr. Lovena Le, [facility]. No pain in the chest or back. Patient states she is tolerating chemotherapy.      ALLERGIES:  has No Known Allergies.  Meds: Current Outpatient Prescriptions  Medication Sig Dispense Refill  . amLODipine (NORVASC) 10 MG tablet TAKE 1 TABLET BY MOUTH EVERY DAY 90 tablet 0  . diclofenac sodium (VOLTAREN) 1 % GEL Apply 2 g topically 4 (four) times daily. 100 g 3  . hydrochlorothiazide (MICROZIDE) 12.5 MG capsule Take 1 capsule (12.5 mg total) by mouth daily. 30 capsule 1  . lidocaine-prilocaine (EMLA) cream Apply topically as needed. Apply to [facility] site 1-2 hours prior to stick and cover with plastic wrap to numb site 30 g 3  . loperamide (IMODIUM) 2 MG capsule Take 1 capsule (2 mg total) by mouth as needed for diarrhea or loose stools (max 8 tabs per day). 30 capsule 0  . Multiple Vitamins-Minerals (MULTIVITAMIN) tablet Take 1 tablet by mouth daily.    . naproxen sodium (ANAPROX) 220 MG tablet Take 220 mg by mouth 2 (two) times daily with a meal.    . ondansetron (ZOFRAN) 8 MG tablet TAKE 1 TABLET BY MOUTH EVERY 8 HOURS AS NEEDED FOR NAUSEA AND VOMITING 20 tablet 2  . polyethylene glycol  powder (MIRALAX) powder Take 17 g by mouth daily as needed. 255 g 0  . potassium chloride SA (K-DUR,KLOR-CON) 20 MEQ tablet TAKE 1 TABLET BY MOUTH EVERY DAY 90 tablet 0  . XARELTO 20 MG TABS tablet TAKE 1 TABLET BY MOUTH EVERY DAY 30 tablet 0  . LORazepam (ATIVAN) 0.5 MG tablet Place 1 tablet (0.5 mg total) under the tongue every 6 (six) hours as needed (for nausea). Causes drowsiness 30 tablet 0  . prochlorperazine (COMPAZINE) 10 MG tablet TAKE 1 TABLET BY MOUTH EVERY 6 HOURS AS NEEDED FOR NAUSEA OR VOMITING (Patient not taking: Reported on 05/08/2015) 30 tablet 1   No current facility-administered medications for this encounter.    Physical Findings: The patient is in no acute distress and the patient is alert and oriented x3.  height is '5\' 11"'$  (1.803 m) and weight is 267 lb 12.8 oz (121.473 kg). Her oral temperature is 97.7 F (36.5 C). Her blood pressure is 154/80 and her pulse is 106. Her respiration is 20. Marland Kitchen   .No palpable cervical, supraclavicular or axillary lymphoadenopathy. The heart has a regular rate and rhythm. The lungs are clear to auscultation.     Lab Findings: Lab Results  Component Value Date   WBC 4.5 05/08/2015   HGB 11.7 05/08/2015   HCT 36.0 05/08/2015   MCV 91.7 05/08/2015   PLT 222 05/08/2015     Radiographic Findings:  No results found.  Impression:  The patient is recovering from the effects of radiation. The patient is currently undergoing chemotherapy and seems to be tolerating well.   Plan:  The patient will continue to follow up with Dr.Sherrill. Follow up in radiation oncology in 4 months. No plans for radiation treatment at this point.  I offered to release her from radiation oncology follow up but she prefers continued visits with me.  I spent 10 minutes with the patient today, the majority of which was spent counseling the patient on the diagnosis of cancer and coordinating care.   ------------------------------------------------  Jodelle Gross, MD, PhD   This document serves as a record of services personally performed by Kyung Rudd, MD. It was created on his behalf by Derek Mound, a trained medical scribe. The creation of this record is based on the scribe's personal observations and the provider's statements to them. This document has been checked and approved by the attending provider.

## 2015-05-08 NOTE — Telephone Encounter (Signed)
Patient called and stated she was on the [employer] bus "they were running late this morning, but I'm still coming, "assured pateint we would still see her and to keep her appt, may need to wait if Md is seeing another scheduled appt  Patien said she was fine with that 10:09 AM

## 2015-05-08 NOTE — Progress Notes (Signed)
[facility] OFFICE PROGRESS NOTE   Diagnosis:  Colon cancer  INTERVAL HISTORY:   [name] returns as scheduled. She completed cycle 3 FOLFOX/Avastin 04/23/2015. She had mild nausea following chemotherapy. No vomiting. No mouth sores. No diarrhea. Cold sensitivity lasted for 5 days. No persistent neuropathy symptoms. She has a good appetite. No shortness of breath. No bleeding. No chest pain. No leg swelling or calf pain. She has had recent right knee pain. She saw her PCP and was diagnosed with arthritis. She is wearing a brace.  Objective:  Vital signs in last 24 hours:  Blood pressure 159/94, pulse 84, temperature 98.3 F (36.8 C), temperature source Oral, resp. rate 18, height '5\' 11"'$  (1.803 m), weight 266 lb (120.657 kg), SpO2 100 %.    HEENT: No thrush or ulcers. Resp: Lungs clear bilaterally. Cardio: Regular rate and rhythm. GI: Abdomen soft and nontender. No hepatomegaly. Vascular: No leg edema. Neuro: Vibratory sense intact over the fingertips per tuning fork exam.  Port-A-Cath without erythema.    Lab Results:  Lab Results  Component Value Date   WBC 4.5 05/08/2015   HGB 11.7 05/08/2015   HCT 36.0 05/08/2015   MCV 91.7 05/08/2015   PLT 222 05/08/2015   NEUTROABS 2.4 05/08/2015    Imaging:  No results found.  Medications: I have reviewed the patient's current medications.  Assessment/Plan: 1. Metastatic colon cancer (T4, N0, M1) adenocarcinoma of the sigmoid colon status post sigmoid colectomy and partial right hepatectomy 04/11/2012. Microsatellite stable. No loss of mismatch repair proteins. K-ras wild-type on the initial codon 12 and 13 testing. K-ras Q61L and APC mutations identified on extended testing. BRAF not mutated.  She completed cycle 1 CAPOX beginning 05/25/2012.   She completed cycle 8 CAPOX beginning 10/19/2012.   CEA on 10/19/2012 normal at 0.8.   CEA 6.1 on 01/11/2013   Restaging CT on 01/11/2013 consistent with  multiple new pulmonary metastases   Cycle 1 of FOLFIRI/Avastin 01/18/2013; cycle 2 02/01/2013,cycle 3 02/16/2013.   CEA 5.0 on 03/01/2013.   Cycle 4 FOLFIRI/Avastin 03/01/2013.   Cycle 5 FOLFIRI/Avastin 03/15/2013.   Restaging chest CT 03/29/2013 with possible slight decrease in size of a left upper lobe metastasis. Other pulmonary nodules stable.   Cycle 6 FOLFIRI/Avastin 03/29/2013   Cycle 7 of FOLFIRI/Avastin 04/12/2013.   CEA 6.3 05/03/2013.   Cycle 8 FOLFIRI/Avastin 05/03/2013.   Cycle 9 FOLFIRI/Avastin 05/17/2013.   Cycle 10 FOLFIRI/Avastin 05/31/2013.   CEA 10.6 on 06/19/2013.   CT chest 06/19/2013 with mild progression of bilateral pulmonary nodules/metastases.   Decision made to continue FOLFIRI/Avastin. She completed cycle 11 06/21/2013.   Cycle 12 FOLFIRI/Avastin 07/05/2013   Cycle 13 FOLFIRI/Avastin 07/19/2013   Cycle 14 FOLFIRI/Avastin 08/01/2013.   CT chest 08/28/2013 with increased in size of bilateral lung nodules.   PET scan 09/22/2013 with multiple hypermetabolic pulmonary nodules bilaterally. Nodules demonstrate progressive enlargement compared with prior CTs. No evidence of extrathoracic metastatic disease.   Status post SRS 2 lung nodules, completed 10/18/2013  CT 01/12/2014 with airspace disease surrounding treated pulmonary nodules, no new nodules  01/12/2014 CEA 0.9  CT chest 04/12/2014 with improvement in airspace disease surrounding treated pulmonary nodules, no new nodules  04/12/2014 CEA 8.2  07/03/2014 CEA 60.3  CT 07/03/2014 with masslike enlargement of 2 treated lung lesions  Cycle 1 Lonsurf 07/12/2014.  Cycle 2 Lonsurf 08/11/2014 or 08/12/2014  Cycle 3 Lonsurf 09/08/2014  Cycle 4 Lonsurf 10/06/2014  Restaging CT of the chest on 10/01/2014 with stable lung  nodules  Cycle 5 Lonsurf 11/05/2014  Cycle 6 Lonsurf 12/03/2014  Restaging CTs of the chest, abdomen, and pelvis on 12/26/2014 with no evidence  of disease progression, stable lung nodules  Cycle 7 Lonsurf 12/31/2014  Cycle 8 Lonsurf 01/28/2015  Elevated CEA 03/12/2015  CT 03/19/2015 with enlargement of bilateral lung masses and progressive bone metastases including an enlarging lesion at T6 with replacement of the vertebral body  Cycle 1 FOLFOX/Avastin 03/25/2015  Cycle 2 FOLFOX/Avastin 04/08/2015  Cycle 3 FOLFOX/Avastin 04/23/2015  Cycle 4 FOLFOX/Avastin 05/08/2015 2. Microcytic anemia, iron deficiency. Improved.  3. Status post Port-A-Cath placement 03/14/2012. X-ray showed the Port-A-Cath tip to be in the azygos vein. The left-sided Port-A-Cath was removed and a new right Port-A-Cath was placed on 05/23/2012. 4. 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 5. Hypertension-persistent on amlodipine. HCTZ added 12/26/2014 6. Delayed nausea following cycle 5 FOLFIRI/Avastin. Emend was added to the premedication regimen 7. Nausea secondary to Lonsurf-improved with Ativan 8. Mild oxaliplatin neuropathy    Disposition: [name] appears stable. She has completed 3 cycles of FOLFOX/Avastin. Plan to proceed with cycle 4 today as scheduled. She will return for follow-up visit and chemotherapy in 2 weeks. She will contact the office in the interim with any problems.  Blood pressure is mildly elevated. We will repeat the blood pressure in the infusion area.    Ned Card ANP/GNP-BC   05/08/2015  12:08 PM

## 2015-05-08 NOTE — Progress Notes (Signed)
Pt tearful this afternoon. States she wants to get better, is tired of getting chemo every 2 weeks. Lives with her daughter but her friends have 'abandoned' her; she is unable to work, has new arthritis pain in her hip/knee making it difficult to get around.  Emotional support and active listening provided. Discussed with patient having SW speak with her on Friday when she comes back for pump d/c.  She is agreeable to that.

## 2015-05-08 NOTE — Progress Notes (Signed)
Follow up s/p radiation  Lung completed 10/06/13, no coughing, pain in right hip and knee 7/10 scale, only takes aleve prn, has lab today at 1045 and chemotherapy and visit with Lattie Haw Thomas/med/onc, appetite good, no nausea at present, energy level ok, says she went to Dr.  Lovena Le, Hart Carwin, BP 154/80 mmHg  Pulse 106  Temp(Src) 97.7 F (36.5 C) (Oral)  Resp 20  Ht '5\' 11"'$  (1.803 m)  Wt 267 lb 12.8 oz (121.473 kg)  BMI 37.37 kg/m2  Wt Readings from Last 3 Encounters:  05/08/15 267 lb 12.8 oz (121.473 kg)  05/01/15 265 lb 9.6 oz (120.475 kg)  04/23/15 266 lb 11.2 oz (120.974 kg)    05/01/15,Stated arthritis,  10:28 AM

## 2015-05-08 NOTE — Telephone Encounter (Signed)
Scheduled patient appt per pof, not able to printed avs report.

## 2015-05-08 NOTE — Patient Instructions (Signed)
[facility] Discharge Instructions for Patients Receiving Chemotherapy  Today you received the following chemotherapy agents: Avastin, Oxaliplatin, Leucovorin, and Adrucil.   To help prevent nausea and vomiting after your treatment, we encourage you to take your nausea medication as directed.   If you develop nausea and vomiting that is not controlled by your nausea medication, call the clinic.   BELOW ARE SYMPTOMS THAT SHOULD BE REPORTED IMMEDIATELY:  *FEVER GREATER THAN 100.5 F  *CHILLS WITH OR WITHOUT FEVER  NAUSEA AND VOMITING THAT IS NOT CONTROLLED WITH YOUR NAUSEA MEDICATION  *UNUSUAL SHORTNESS OF BREATH  *UNUSUAL BRUISING OR BLEEDING  TENDERNESS IN MOUTH AND THROAT WITH OR WITHOUT PRESENCE OF ULCERS  *URINARY PROBLEMS  *BOWEL PROBLEMS  UNUSUAL RASH Items with * indicate a potential emergency and should be followed up as soon as possible.  Feel free to call the clinic you have any questions or concerns. The clinic phone number is [phone].  Please show the [facility] at check-in to the Emergency Department and triage nurse.

## 2015-05-09 ENCOUNTER — Other Ambulatory Visit: Payer: Self-pay | Admitting: *Deleted

## 2015-05-09 ENCOUNTER — Telehealth: Payer: Self-pay | Admitting: *Deleted

## 2015-05-09 LAB — CEA (PARALLEL TESTING): CEA: 792 ng/mL — ABNORMAL HIGH (ref 0.0–5.0)

## 2015-05-09 LAB — CEA: CEA: 1240 ng/mL — ABNORMAL HIGH (ref 0.0–4.7)

## 2015-05-09 NOTE — Telephone Encounter (Signed)
Per Dr. Benay Spice; notified pt that cea is better. Pt verbalized understanding.

## 2015-05-09 NOTE — Telephone Encounter (Signed)
-----   Message from Ladell Pier, MD sent at 05/09/2015  7:41 AM EST ----- Please call patient, cea is better

## 2015-05-09 NOTE — Telephone Encounter (Signed)
Pt called wanting to know if Lorazepam script was called in to pt's pharmacy [facility] on [address] as informed by Ned Card, NP at her office visit yesterday 05/08/15.   Nurse called pharmacy and was informed that they had not receive the order.   This triage nurse called in Lorazepam order as prescribed by NP from yesterday. Spoke with pt and informed pt of above info.  Pt voiced understanding. Pt's  Phone    [phone].

## 2015-05-10 ENCOUNTER — Ambulatory Visit (HOSPITAL_BASED_OUTPATIENT_CLINIC_OR_DEPARTMENT_OTHER): Payer: Medicare Other

## 2015-05-10 ENCOUNTER — Encounter: Payer: Self-pay | Admitting: Radiation Oncology

## 2015-05-10 VITALS — BP 139/83 | HR 79 | Temp 98.4°F | Resp 16

## 2015-05-10 DIAGNOSIS — C189 Malignant neoplasm of colon, unspecified: Secondary | ICD-10-CM | POA: Diagnosis not present

## 2015-05-10 DIAGNOSIS — Z452 Encounter for adjustment and management of vascular access device: Secondary | ICD-10-CM | POA: Diagnosis not present

## 2015-05-10 DIAGNOSIS — C787 Secondary malignant neoplasm of liver and intrahepatic bile duct: Principal | ICD-10-CM

## 2015-05-10 MED ORDER — SODIUM CHLORIDE 0.9 % IJ SOLN
10.0000 mL | INTRAMUSCULAR | Status: DC | PRN
  Administered 2015-05-10: 10 mL
  Filled 2015-05-10: qty 10

## 2015-05-10 MED ORDER — HEPARIN SOD (PORK) LOCK FLUSH 100 UNIT/ML IV SOLN
500.0000 [IU] | Freq: Once | INTRAVENOUS | Status: AC | PRN
  Administered 2015-05-10: 500 [IU]
  Filled 2015-05-10: qty 5

## 2015-05-17 ENCOUNTER — Other Ambulatory Visit: Payer: Self-pay | Admitting: Oncology

## 2015-05-19 ENCOUNTER — Other Ambulatory Visit: Payer: Self-pay | Admitting: Oncology

## 2015-05-20 ENCOUNTER — Telehealth: Payer: Self-pay | Admitting: *Deleted

## 2015-05-20 ENCOUNTER — Other Ambulatory Visit: Payer: Self-pay | Admitting: Oncology

## 2015-05-20 NOTE — Telephone Encounter (Signed)
Notified pt that Xarelto has been sent to her pharmacy for re-fill.  Pt verbalized understanding and expressed appreciation for call back.

## 2015-05-20 NOTE — Telephone Encounter (Signed)
"  My Pharmacy has not received a call from you all.  I need refills on my xarelto and lomotil.  The Pharmacy gave me three pills xarelto while they wait.    Advised the lomotil was sent in 05-17-2015 and awaiting MD authorization for xarelto."

## 2015-05-22 ENCOUNTER — Telehealth: Payer: Self-pay | Admitting: Oncology

## 2015-05-22 ENCOUNTER — Ambulatory Visit (HOSPITAL_COMMUNITY)
Admission: RE | Admit: 2015-05-22 | Discharge: 2015-05-22 | Disposition: A | Discharge status: Home / Self Care | Payer: Medicare Other | Source: Ambulatory Visit | Attending: Nurse Practitioner | Admitting: Nurse Practitioner

## 2015-05-22 ENCOUNTER — Encounter: Payer: Self-pay | Admitting: *Deleted

## 2015-05-22 ENCOUNTER — Ambulatory Visit (HOSPITAL_BASED_OUTPATIENT_CLINIC_OR_DEPARTMENT_OTHER): Payer: Medicare Other | Admitting: Nurse Practitioner

## 2015-05-22 ENCOUNTER — Ambulatory Visit (HOSPITAL_BASED_OUTPATIENT_CLINIC_OR_DEPARTMENT_OTHER): Payer: Medicare Other

## 2015-05-22 ENCOUNTER — Other Ambulatory Visit: Payer: Self-pay | Admitting: Nurse Practitioner

## 2015-05-22 ENCOUNTER — Other Ambulatory Visit (HOSPITAL_BASED_OUTPATIENT_CLINIC_OR_DEPARTMENT_OTHER): Payer: Medicare Other

## 2015-05-22 VITALS — BP 154/87 | HR 90 | Temp 97.7°F | Resp 18 | Ht 71.0 in | Wt 263.6 lb

## 2015-05-22 VITALS — BP 135/76

## 2015-05-22 DIAGNOSIS — C189 Malignant neoplasm of colon, unspecified: Secondary | ICD-10-CM

## 2015-05-22 DIAGNOSIS — C7800 Secondary malignant neoplasm of unspecified lung: Secondary | ICD-10-CM | POA: Diagnosis not present

## 2015-05-22 DIAGNOSIS — C187 Malignant neoplasm of sigmoid colon: Secondary | ICD-10-CM

## 2015-05-22 DIAGNOSIS — C787 Secondary malignant neoplasm of liver and intrahepatic bile duct: Principal | ICD-10-CM

## 2015-05-22 DIAGNOSIS — G620 Drug-induced polyneuropathy: Secondary | ICD-10-CM | POA: Diagnosis not present

## 2015-05-22 DIAGNOSIS — Z5111 Encounter for antineoplastic chemotherapy: Secondary | ICD-10-CM

## 2015-05-22 DIAGNOSIS — Z5112 Encounter for antineoplastic immunotherapy: Secondary | ICD-10-CM | POA: Diagnosis not present

## 2015-05-22 DIAGNOSIS — C7951 Secondary malignant neoplasm of bone: Secondary | ICD-10-CM | POA: Diagnosis not present

## 2015-05-22 DIAGNOSIS — D509 Iron deficiency anemia, unspecified: Secondary | ICD-10-CM

## 2015-05-22 LAB — COMPREHENSIVE METABOLIC PANEL
ALT: 39 U/L (ref 0–55)
ANION GAP: 10 meq/L (ref 3–11)
AST: 43 U/L — ABNORMAL HIGH (ref 5–34)
Albumin: 4 g/dL (ref 3.5–5.0)
Alkaline Phosphatase: 170 U/L — ABNORMAL HIGH (ref 40–150)
BUN: 15 mg/dL (ref 7.0–26.0)
CALCIUM: 10.1 mg/dL (ref 8.4–10.4)
CHLORIDE: 106 meq/L (ref 98–109)
CO2: 24 mEq/L (ref 22–29)
CREATININE: 0.9 mg/dL (ref 0.6–1.1)
EGFR: 80 mL/min/{1.73_m2} — AB (ref >90)
Glucose: 97 mg/dl (ref 70–140)
POTASSIUM: 3.6 meq/L (ref 3.5–5.1)
Sodium: 140 mEq/L (ref 136–145)
Total Bilirubin: 0.8 mg/dL (ref 0.20–1.20)
Total Protein: 8.3 g/dL (ref 6.4–8.3)

## 2015-05-22 LAB — CBC WITH DIFFERENTIAL/PLATELET
BASO%: 1.1 % (ref 0.0–2.0)
BASOS ABS: 0.1 10*3/uL (ref 0.0–0.1)
EOS%: 1.4 % (ref 0.0–7.0)
Eosinophils Absolute: 0.1 10*3/uL (ref 0.0–0.5)
HEMATOCRIT: 35 % (ref 34.8–46.6)
HGB: 11.3 g/dL — ABNORMAL LOW (ref 11.6–15.9)
LYMPH#: 1.6 10*3/uL (ref 0.9–3.3)
LYMPH%: 34.4 % (ref 14.0–49.7)
MCH: 29.5 pg (ref 25.1–34.0)
MCHC: 32.3 g/dL (ref 31.5–36.0)
MCV: 91.5 fL (ref 79.5–101.0)
MONO#: 0.6 10*3/uL (ref 0.1–0.9)
MONO%: 12.4 % (ref 0.0–14.0)
NEUT#: 2.4 10*3/uL (ref 1.5–6.5)
NEUT%: 50.7 % (ref 38.4–76.8)
PLATELETS: 207 10*3/uL (ref 145–400)
RBC: 3.83 10*6/uL (ref 3.70–5.45)
RDW: 18.5 % — ABNORMAL HIGH (ref 11.2–14.5)
WBC: 4.7 10*3/uL (ref 3.9–10.3)

## 2015-05-22 LAB — UA PROTEIN, DIPSTICK - CHCC

## 2015-05-22 MED ORDER — SODIUM CHLORIDE 0.9 % IV SOLN
Freq: Once | INTRAVENOUS | Status: AC
  Administered 2015-05-22: 14:00:00 via INTRAVENOUS

## 2015-05-22 MED ORDER — DEXTROSE 5 % IV SOLN
Freq: Once | INTRAVENOUS | Status: AC
  Administered 2015-05-22: 14:00:00 via INTRAVENOUS

## 2015-05-22 MED ORDER — SODIUM CHLORIDE 0.9 % IV SOLN
5.0000 mg/kg | Freq: Once | INTRAVENOUS | Status: AC
  Administered 2015-05-22: 600 mg via INTRAVENOUS
  Filled 2015-05-22: qty 24

## 2015-05-22 MED ORDER — LEUCOVORIN CALCIUM INJECTION 100 MG
20.0000 mg/m2 | Freq: Once | INTRAMUSCULAR | Status: AC
  Administered 2015-05-22: 50 mg via INTRAVENOUS
  Filled 2015-05-22: qty 2.5

## 2015-05-22 MED ORDER — SODIUM CHLORIDE 0.9 % IV SOLN
2400.0000 mg/m2 | INTRAVENOUS | Status: DC
  Administered 2015-05-22: 5950 mg via INTRAVENOUS
  Filled 2015-05-22: qty 119

## 2015-05-22 MED ORDER — SODIUM CHLORIDE 0.9 % IV SOLN
Freq: Once | INTRAVENOUS | Status: AC
  Administered 2015-05-22: 15:00:00 via INTRAVENOUS
  Filled 2015-05-22: qty 4

## 2015-05-22 MED ORDER — OXALIPLATIN CHEMO INJECTION 100 MG/20ML
81.0000 mg/m2 | Freq: Once | INTRAVENOUS | Status: AC
  Administered 2015-05-22: 200 mg via INTRAVENOUS
  Filled 2015-05-22: qty 40

## 2015-05-22 MED ORDER — FLUOROURACIL CHEMO INJECTION 2.5 GM/50ML
400.0000 mg/m2 | Freq: Once | INTRAVENOUS | Status: AC
  Administered 2015-05-22: 1000 mg via INTRAVENOUS
  Filled 2015-05-22: qty 20

## 2015-05-22 NOTE — Progress Notes (Signed)
[facility] OFFICE PROGRESS NOTE   Diagnosis:  Colon cancer  INTERVAL HISTORY:   [name] returns as scheduled. She completed cycle 4 FOLFOX/Avastin 05/08/2015. She had mild nausea. No vomiting. No mouth sores. Mild diarrhea relieved with Imodium. Cold sensitivity lasted for 5 days. She has stable numbness over the soles of her feet. This predated current chemotherapy. She denies any bleeding. No shortness of breath or chest pain. No abdominal pain. She reports pain in the right groin with ambulation. She was applying a "cream" to the groin prescribed by her primary care provider with some improvement. She was referred for x-rays earlier this month but has been unable to have the x-rays completed.  Objective:  Vital signs in last 24 hours:  Blood pressure 154/87, pulse 90, temperature 97.7 F (36.5 C), temperature source Oral, resp. rate 18, height _0  (1.803 m), weight 263 lb 9.6 oz (119.568 kg), SpO2 99 %.    HEENT: No thrush or ulcers. Resp: Lungs clear bilaterally. Cardio: Regular rate and rhythm. GI: Abdomen soft and nontender. No hepatomegaly. Vascular: No leg edema. Neuro: Vibratory sense mildly decreased over the fingertips per tuning fork exam.  Port-A-Cath without erythema.    Lab Results:  Lab Results  Component Value Date   WBC 4.7 05/22/2015   HGB 11.3* 05/22/2015   HCT 35.0 05/22/2015   MCV 91.5 05/22/2015   PLT 207 05/22/2015   NEUTROABS 2.4 05/22/2015    Imaging:  No results found.  Medications: I have reviewed the patient's current medications.  Assessment/Plan: 1. Metastatic colon cancer (T4, N0, M1) adenocarcinoma of the sigmoid colon status post sigmoid colectomy and partial right hepatectomy 04/11/2012. Microsatellite stable. No loss of mismatch repair proteins. K-ras wild-type on the initial codon 12 and 13 testing. K-ras Q61L and APC mutations identified on extended testing. BRAF not mutated.  She completed cycle 1 CAPOX  beginning 05/25/2012.   She completed cycle 8 CAPOX beginning 10/19/2012.   CEA on 10/19/2012 normal at 0.8.   CEA 6.1 on 01/11/2013   Restaging CT on 01/11/2013 consistent with multiple new pulmonary metastases   Cycle 1 of FOLFIRI/Avastin 01/18/2013; cycle 2 02/01/2013,cycle 3 02/16/2013.   CEA 5.0 on 03/01/2013.   Cycle 4 FOLFIRI/Avastin 03/01/2013.   Cycle 5 FOLFIRI/Avastin 03/15/2013.   Restaging chest CT 03/29/2013 with possible slight decrease in size of a left upper lobe metastasis. Other pulmonary nodules stable.   Cycle 6 FOLFIRI/Avastin 03/29/2013   Cycle 7 of FOLFIRI/Avastin 04/12/2013.   CEA 6.3 05/03/2013.   Cycle 8 FOLFIRI/Avastin 05/03/2013.   Cycle 9 FOLFIRI/Avastin 05/17/2013.   Cycle 10 FOLFIRI/Avastin 05/31/2013.   CEA 10.6 on 06/19/2013.   CT chest 06/19/2013 with mild progression of bilateral pulmonary nodules/metastases.   Decision made to continue FOLFIRI/Avastin. She completed cycle 11 06/21/2013.   Cycle 12 FOLFIRI/Avastin 07/05/2013   Cycle 13 FOLFIRI/Avastin 07/19/2013   Cycle 14 FOLFIRI/Avastin 08/01/2013.   CT chest 08/28/2013 with increased in size of bilateral lung nodules.   PET scan 09/22/2013 with multiple hypermetabolic pulmonary nodules bilaterally. Nodules demonstrate progressive enlargement compared with prior CTs. No evidence of extrathoracic metastatic disease.   Status post SRS 2 lung nodules, completed 10/18/2013  CT 01/12/2014 with airspace disease surrounding treated pulmonary nodules, no new nodules  01/12/2014 CEA 0.9  CT chest 04/12/2014 with improvement in airspace disease surrounding treated pulmonary nodules, no new nodules  04/12/2014 CEA 8.2  07/03/2014 CEA 60.3  CT 07/03/2014 with masslike enlargement of 2 treated lung lesions  Cycle 1  Lonsurf 07/12/2014.  Cycle 2 Lonsurf 08/11/2014 or 08/12/2014  Cycle 3 Lonsurf 09/08/2014  Cycle 4 Lonsurf 10/06/2014  Restaging CT of  the chest on 10/01/2014 with stable lung nodules  Cycle 5 Lonsurf 11/05/2014  Cycle 6 Lonsurf 12/03/2014  Restaging CTs of the chest, abdomen, and pelvis on 12/26/2014 with no evidence of disease progression, stable lung nodules  Cycle 7 Lonsurf 12/31/2014  Cycle 8 Lonsurf 01/28/2015  Elevated CEA 03/12/2015  CT 03/19/2015 with enlargement of bilateral lung masses and progressive bone metastases including an enlarging lesion at T6 with replacement of the vertebral body  Cycle 1 FOLFOX/Avastin 03/25/2015  Cycle 2 FOLFOX/Avastin 04/08/2015  Cycle 3 FOLFOX/Avastin 04/23/2015  Cycle 4 FOLFOX/Avastin 05/08/2015  Cycle 5 FOLFOX/Avastin 05/22/2015 2. Microcytic anemia, iron deficiency. Improved.  3. Status post Port-A-Cath placement 03/14/2012. X-ray showed the Port-A-Cath tip to be in the azygos vein. The left-sided Port-A-Cath was removed and a new right Port-A-Cath was placed on 05/23/2012. 4. 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 5. Hypertension-persistent on amlodipine. HCTZ added 12/26/2014 6. Delayed nausea following cycle 5 FOLFIRI/Avastin. Emend was added to the premedication regimen 7. Nausea secondary to Lonsurf-improved with Ativan 8. Mild oxaliplatin neuropathy 9. Right groin pain. Question referred pain from the hip.   Disposition: [name] appears stable. She has completed 4 cycles of FOLFOX/Avastin. Plan to proceed with cycle 5 today as scheduled. She will return for restaging CT scans and a follow-up visit in 3 weeks.  We referred her for plain x-rays of the right hip today.  Plan reviewed with Dr. Benay Spice.  Ned Card ANP/GNP-BC   05/22/2015  12:35 PM

## 2015-05-22 NOTE — Patient Instructions (Signed)
[facility] Discharge Instructions for Patients Receiving Chemotherapy  Today you received the following chemotherapy agents 50f,leucovorin,oxaliplatin, avastin To help prevent nausea and vomiting after your treatment, we encourage you to take your nausea medication as prescribed.  If you develop nausea and vomiting that is not controlled by your nausea medication, call the clinic.   BELOW ARE SYMPTOMS THAT SHOULD BE REPORTED IMMEDIATELY:  *FEVER GREATER THAN 100.5 F  *CHILLS WITH OR WITHOUT FEVER  NAUSEA AND VOMITING THAT IS NOT CONTROLLED WITH YOUR NAUSEA MEDICATION  *UNUSUAL SHORTNESS OF BREATH  *UNUSUAL BRUISING OR BLEEDING  TENDERNESS IN MOUTH AND THROAT WITH OR WITHOUT PRESENCE OF ULCERS  *URINARY PROBLEMS  *BOWEL PROBLEMS  UNUSUAL RASH Items with * indicate a potential emergency and should be followed up as soon as possible.  Feel free to call the clinic you have any questions or concerns. The clinic phone number is [phone].  Please show the [facility] check-in to the Emergency Department and triage nurse.  Bevacizumab injection What is this medicine? BEVACIZUMAB (be va SIZ yoo mab) is a monoclonal antibody. It is used to treat cervical cancer, colorectal cancer, glioblastoma multiforme, non-small cell lung cancer (NSCLC), ovarian cancer, and renal cell cancer. This medicine may be used for other purposes; ask your health care provider or pharmacist if you have questions. What should I tell my health care provider before I take this medicine? They need to know if you have any of these conditions: -blood clots -heart disease, including heart failure, heart attack, or chest pain (angina) -high blood pressure -infection (especially a virus infection such as chickenpox, cold sores, or herpes) -kidney disease -lung disease -prior chemotherapy with doxorubicin, daunorubicin, epirubicin, or other anthracycline type chemotherapy agents -recent  or ongoing radiation therapy -recent surgery -stroke -an unusual or allergic reaction to bevacizumab, hamster proteins, mouse proteins, other medicines, foods, dyes, or preservatives -pregnant or trying to get pregnant -breast-feeding How should I use this medicine? This medicine is for infusion into a vein. It is given by a health care professional in a hospital or clinic setting. Talk to your pediatrician regarding the use of this medicine in children. Special care may be needed. Overdosage: If you think you have taken too much of this medicine contact a poison control center or emergency room at once. NOTE: This medicine is only for you. Do not share this medicine with others. What if I miss a dose? It is important not to miss your dose. Call your doctor or health care professional if you are unable to keep an appointment. What may interact with this medicine? Interactions are not expected. This list may not describe all possible interactions. Give your health care provider a list of all the medicines, herbs, non-prescription drugs, or dietary supplements you use. Also tell them if you smoke, drink alcohol, or use illegal drugs. Some items may interact with your medicine. What should I watch for while using this medicine? Your condition will be monitored carefully while you are receiving this medicine. You will need important blood work and urine testing done while you are taking this medicine. During your treatment, let your health care professional know if you have any unusual symptoms, such as difficulty breathing. This medicine may rarely cause 'gastrointestinal perforation' (holes in the stomach, intestines or colon), a serious side effect requiring surgery to repair. This medicine should be started at least 28 days following major surgery and the site of the surgery should be totally healed. Check  with your doctor before scheduling dental work or surgery while you are receiving this  treatment. Talk to your doctor if you have recently had surgery or if you have a wound that has not healed. Do not become pregnant while taking this medicine or for 6 months after stopping it. Women should inform their doctor if they wish to become pregnant or think they might be pregnant. There is a potential for serious side effects to an unborn child. Talk to your health care professional or pharmacist for more information. Do not breast-feed an infant while taking this medicine. This medicine has caused ovarian failure in some women. This medicine may interfere with the ability to have a child. You should talk to your doctor or health care professional if you are concerned about your fertility. What side effects may I notice from receiving this medicine? Side effects that you should report to your doctor or health care professional as soon as possible: -allergic reactions like skin rash, itching or hives, swelling of the face, lips, or tongue -signs of infection - fever or chills, cough, sore throat, pain or trouble passing urine -signs of decreased platelets or bleeding - bruising, pinpoint red spots on the skin, black, tarry stools, nosebleeds, blood in the urine -breathing problems -changes in vision -chest pain -confusion -jaw pain, especially after dental work -mouth sores -seizures -severe abdominal pain -severe headache -sudden numbness or weakness of the face, arm or leg -swelling of legs or ankles -symptoms of a stroke: change in mental awareness, inability to talk or move one side of the body (especially in patients with lung cancer) -trouble passing urine or change in the amount of urine -trouble speaking or understanding -trouble walking, dizziness, loss of balance or coordination Side effects that usually do not require medical attention (report to your doctor or health care professional if they continue or are bothersome): -constipation -diarrhea -dry  skin -headache -loss of appetite -nausea, vomiting This list may not describe all possible side effects. Call your doctor for medical advice about side effects. You may report side effects to FDA at 1-800-FDA-1088. Where should I keep my medicine? This drug is given in a hospital or clinic and will not be stored at home. NOTE: This sheet is a summary. It may not cover all possible information. If you have questions about this medicine, talk to your doctor, pharmacist, or health care provider.    2016, Elsevier/Gold Standard. (2014-06-12 16:58:44)

## 2015-05-22 NOTE — Telephone Encounter (Signed)
Gv pt appts for 2/15.

## 2015-05-22 NOTE — Progress Notes (Signed)
[facility] Work  Clinical Social Work was referred by patient for assistance with SCAT pass and to reassess psychosocial needs.  Clinical Social Worker met with patient in [place] office  to offer support and assess for needs. CSW provided SCAT pass for pt. Pt reports she had a rough day in chemo, just "tired of coming forever". She states she is doing better today, but is drained at times. CSW provided support and understanding. Pt denies needing help at home or other needs currently. Pt brought form for getting her pump  "recertified". This appeared to be a form managed care needed to assist with for pt. CSW made copy for pt and she plans to check with them prior to her appointment downstairs. Pt plans to check at home and see when last Cancer Care application for assistance was completed. Pt could use additional financial assistance as her medication costs have increased after the New Year. Pt to phone CSW team with outcome from [facility].   Clinical Social Work interventions: Supportive listening Resource education  [name], [place] Worker [place]  [place] Phone: [phone] Fax: [phone]

## 2015-05-23 LAB — CEA (PARALLEL TESTING): CEA: 619.4 ng/mL — AB (ref 0.0–5.0)

## 2015-05-23 LAB — CEA: CEA1: 894.6 ng/mL — AB (ref 0.0–4.7)

## 2015-05-24 ENCOUNTER — Ambulatory Visit (HOSPITAL_BASED_OUTPATIENT_CLINIC_OR_DEPARTMENT_OTHER): Payer: Medicare Other

## 2015-05-24 DIAGNOSIS — C787 Secondary malignant neoplasm of liver and intrahepatic bile duct: Principal | ICD-10-CM

## 2015-05-24 DIAGNOSIS — C187 Malignant neoplasm of sigmoid colon: Secondary | ICD-10-CM | POA: Diagnosis not present

## 2015-05-24 DIAGNOSIS — Z452 Encounter for adjustment and management of vascular access device: Secondary | ICD-10-CM | POA: Diagnosis not present

## 2015-05-24 DIAGNOSIS — C189 Malignant neoplasm of colon, unspecified: Secondary | ICD-10-CM

## 2015-05-24 MED ORDER — SODIUM CHLORIDE 0.9 % IJ SOLN
10.0000 mL | INTRAMUSCULAR | Status: DC | PRN
  Administered 2015-05-24: 10 mL
  Filled 2015-05-24: qty 10

## 2015-05-24 MED ORDER — HEPARIN SOD (PORK) LOCK FLUSH 100 UNIT/ML IV SOLN
500.0000 [IU] | Freq: Once | INTRAVENOUS | Status: AC | PRN
  Administered 2015-05-24: 500 [IU]
  Filled 2015-05-24: qty 5

## 2015-05-24 MED ORDER — HEPARIN SOD (PORK) LOCK FLUSH 100 UNIT/ML IV SOLN
250.0000 [IU] | Freq: Once | INTRAVENOUS | Status: DC | PRN
  Filled 2015-05-24: qty 5

## 2015-05-24 NOTE — Patient Instructions (Signed)

## 2015-06-10 ENCOUNTER — Ambulatory Visit (HOSPITAL_COMMUNITY)
Admission: RE | Admit: 2015-06-10 | Discharge: 2015-06-10 | Disposition: A | Discharge status: Home / Self Care | Payer: Medicare Other | Source: Ambulatory Visit | Attending: Nurse Practitioner | Admitting: Nurse Practitioner

## 2015-06-10 ENCOUNTER — Encounter (HOSPITAL_COMMUNITY): Payer: Self-pay

## 2015-06-10 DIAGNOSIS — C7801 Secondary malignant neoplasm of right lung: Secondary | ICD-10-CM

## 2015-06-10 DIAGNOSIS — Z9889 Other specified postprocedural states: Secondary | ICD-10-CM

## 2015-06-10 DIAGNOSIS — C787 Secondary malignant neoplasm of liver and intrahepatic bile duct: Secondary | ICD-10-CM | POA: Insufficient documentation

## 2015-06-10 DIAGNOSIS — C189 Malignant neoplasm of colon, unspecified: Secondary | ICD-10-CM

## 2015-06-10 DIAGNOSIS — C7951 Secondary malignant neoplasm of bone: Secondary | ICD-10-CM

## 2015-06-10 DIAGNOSIS — K760 Fatty (change of) liver, not elsewhere classified: Secondary | ICD-10-CM

## 2015-06-10 DIAGNOSIS — C7802 Secondary malignant neoplasm of left lung: Secondary | ICD-10-CM | POA: Insufficient documentation

## 2015-06-10 DIAGNOSIS — Z9221 Personal history of antineoplastic chemotherapy: Secondary | ICD-10-CM | POA: Insufficient documentation

## 2015-06-10 MED ORDER — IOHEXOL 300 MG/ML  SOLN
100.0000 mL | Freq: Once | INTRAMUSCULAR | Status: AC | PRN
  Administered 2015-06-10: 100 mL via INTRAVENOUS

## 2015-06-10 MED ORDER — IOHEXOL 300 MG/ML  SOLN
50.0000 mL | Freq: Once | INTRAMUSCULAR | Status: DC | PRN
  Administered 2015-06-10: 50 mL via ORAL
  Filled 2015-06-10: qty 50

## 2015-06-11 ENCOUNTER — Other Ambulatory Visit: Payer: Self-pay | Admitting: *Deleted

## 2015-06-12 ENCOUNTER — Ambulatory Visit: Payer: Medicare Other

## 2015-06-12 ENCOUNTER — Telehealth: Payer: Self-pay | Admitting: *Deleted

## 2015-06-12 ENCOUNTER — Ambulatory Visit: Payer: Medicare Other | Admitting: Oncology

## 2015-06-12 ENCOUNTER — Encounter (HOSPITAL_COMMUNITY): Payer: Self-pay | Admitting: Emergency Medicine

## 2015-06-12 ENCOUNTER — Other Ambulatory Visit: Payer: Medicare Other

## 2015-06-12 ENCOUNTER — Other Ambulatory Visit: Payer: Self-pay | Admitting: *Deleted

## 2015-06-12 ENCOUNTER — Emergency Department (HOSPITAL_COMMUNITY): Payer: Medicare Other

## 2015-06-12 ENCOUNTER — Emergency Department (HOSPITAL_COMMUNITY)
Admission: EM | Admit: 2015-06-12 | Discharge: 2015-06-13 | Disposition: A | Discharge status: Home / Self Care | Payer: Medicare Other | Source: Home / Self Care | Attending: Emergency Medicine | Admitting: Emergency Medicine

## 2015-06-12 DIAGNOSIS — F419 Anxiety disorder, unspecified: Secondary | ICD-10-CM

## 2015-06-12 DIAGNOSIS — I10 Essential (primary) hypertension: Secondary | ICD-10-CM

## 2015-06-12 DIAGNOSIS — Z791 Long term (current) use of non-steroidal anti-inflammatories (NSAID): Secondary | ICD-10-CM

## 2015-06-12 DIAGNOSIS — M79659 Pain in unspecified thigh: Secondary | ICD-10-CM

## 2015-06-12 DIAGNOSIS — Z79899 Other long term (current) drug therapy: Secondary | ICD-10-CM | POA: Insufficient documentation

## 2015-06-12 DIAGNOSIS — Z8505 Personal history of malignant neoplasm of liver: Secondary | ICD-10-CM

## 2015-06-12 DIAGNOSIS — M79651 Pain in right thigh: Secondary | ICD-10-CM

## 2015-06-12 DIAGNOSIS — Z8719 Personal history of other diseases of the digestive system: Secondary | ICD-10-CM | POA: Insufficient documentation

## 2015-06-12 DIAGNOSIS — Z862 Personal history of diseases of the blood and blood-forming organs and certain disorders involving the immune mechanism: Secondary | ICD-10-CM

## 2015-06-12 DIAGNOSIS — Z8669 Personal history of other diseases of the nervous system and sense organs: Secondary | ICD-10-CM | POA: Insufficient documentation

## 2015-06-12 DIAGNOSIS — Z923 Personal history of irradiation: Secondary | ICD-10-CM | POA: Insufficient documentation

## 2015-06-12 DIAGNOSIS — R011 Cardiac murmur, unspecified: Secondary | ICD-10-CM | POA: Insufficient documentation

## 2015-06-12 DIAGNOSIS — Z872 Personal history of diseases of the skin and subcutaneous tissue: Secondary | ICD-10-CM

## 2015-06-12 DIAGNOSIS — C799 Secondary malignant neoplasm of unspecified site: Secondary | ICD-10-CM

## 2015-06-12 DIAGNOSIS — Z9049 Acquired absence of other specified parts of digestive tract: Secondary | ICD-10-CM

## 2015-06-12 DIAGNOSIS — C785 Secondary malignant neoplasm of large intestine and rectum: Secondary | ICD-10-CM

## 2015-06-12 MED ORDER — OXYCODONE-ACETAMINOPHEN 5-325 MG PO TABS: 1.0000 | ORAL_TABLET | ORAL | Status: DC | PRN

## 2015-06-12 MED ORDER — MORPHINE SULFATE (PF) 4 MG/ML IV SOLN
4.0000 mg | Freq: Once | INTRAVENOUS | Status: AC
  Administered 2015-06-12: 4 mg via INTRAVENOUS
  Filled 2015-06-12: qty 1

## 2015-06-12 MED ORDER — OXYCODONE-ACETAMINOPHEN 5-325 MG PO TABS
1.0000 | ORAL_TABLET | Freq: Once | ORAL | Status: AC
  Administered 2015-06-12: 1 via ORAL
  Filled 2015-06-12: qty 1

## 2015-06-12 MED ORDER — OXYCODONE-ACETAMINOPHEN 5-325 MG PO TABS: 2.0000 | ORAL_TABLET | Freq: Once | ORAL | Status: DC

## 2015-06-12 NOTE — Telephone Encounter (Signed)
appts moved and chemo RN to call patient

## 2015-06-12 NOTE — ED Notes (Signed)
Bed: TO67 Expected date:  Expected time:  Means of arrival:  Comments: EMS-leg pain, cancer patient

## 2015-06-12 NOTE — ED Provider Notes (Signed)
CSN: [id number]     Arrival date & time 06/12/15  1834 History   First MD Initiated Contact with Patient 06/12/15 1841     Chief Complaint  Patient presents with  . Leg Pain    The history is provided by the patient.   Patient is currently being treated for metastatic colon cancer.  She presents to the emergency department today complaining of worsening anterior right leg pain.  His had this pain worsening over the past 3-4 weeks.  Denies swelling of the right leg.  She reports her pain in her right leg is worse with flexion at the right hip but feels the discomfort in the mid to distal anterior right thigh.  Denies erythema or warmth of the leg.  No recent injury or trauma.  No heavy lifting.  Her pain in her right leg is been severe and she's been using a cane to walk secondary to pain   Past Medical History  Diagnosis Date  . Hypertension     pt states she stopped taking meds while in her 20's  . Blood in stool   . History of blood transfusion   . Anemia   . Eczema   . Heart murmur     does not see a cardiologist has been told she does has a murmur and also that she does not have a murmur  . Anxiety   . Neuromuscular disorder (HCC)     numbness feet  . Hx of radiation therapy 10/06/13,16th,19th,22nd,&10/18/13    SBRT lung  . Cancer ([place])   . Metastatic colon cancer to liver [facility])    Past Surgical History  Procedure Laterality Date  . Breast lumpectomy      left breast  . Colonoscopy  02/18/2012    Procedure: COLONOSCOPY;  Surgeon: Beryle Beams, MD;  Location: [place];  Service: Endoscopy;  Laterality: N/A;  . Portacath placement  03/14/2012    Procedure: INSERTION PORT-A-CATH;  Surgeon: Ralene Ok, MD;  Location: [place];  Service: General;  Laterality: N/A;  . Laparoscopic sigmoid colectomy  04/11/2012    Procedure: LAPAROSCOPIC SIGMOID COLECTOMY;  Surgeon: Stark Klein, MD;  Location: [place];  Service: General;;  converted to Open with splenic flexure take down  .  Open partial hepatectomy [83]  04/11/2012    Procedure: OPEN PARTIAL HEPATECTOMY [83];  Surgeon: Stark Klein, MD;  Location: [place];  Service: General;  Laterality: Right;  . Laparoscopy  04/11/2012    Procedure: LAPAROSCOPY DIAGNOSTIC;  Surgeon: Stark Klein, MD;  Location: [place];  Service: General;  Laterality: N/A;  . Cholecystectomy  04/11/2012    Procedure: CHOLECYSTECTOMY;  Surgeon: Stark Klein, MD;  Location: [place];  Service: General;;  . Ileocecetomy  04/11/2012    Procedure: Pennie Rushing;  Surgeon: Stark Klein, MD;  Location: [place];  Service: General;  Laterality: N/A;  . Port a cath revision  05/23/2012    Procedure: PORT A CATH REVISION;  Surgeon: Ralene Ok, MD;  Location: [place];  Service: General;  Laterality: Right;  Port-A-Cath repositioning versus replacement  . Abdominal hysterectomy      early 68's   Family History  Problem Relation Age of Onset  . Hypertension Mother   . Cancer Mother 43    breast ca  . Heart disease Father    Social History  Substance Use Topics  . Smoking status: Never Smoker   . Smokeless tobacco: Never Used  . Alcohol Use: No   OB History  No data available     Review of Systems  All other systems reviewed and are negative.     Allergies  Review of patient's allergies indicates no known allergies.  Home Medications   Prior to Admission medications   Medication Sig Start Date End Date Taking? Authorizing Provider  amLODipine (NORVASC) 10 MG tablet TAKE 1 TABLET BY MOUTH EVERY DAY Patient taking differently: Take 1 tablet by mouth every day. 05/20/15  Yes Ladell Pier, MD  diclofenac sodium (VOLTAREN) 1 % GEL Apply 2 g topically 4 (four) times daily. 05/01/15  Yes Iline Oven, MD  hydrochlorothiazide (MICROZIDE) 12.5 MG capsule Take 1 capsule (12.5 mg total) by mouth daily. 12/26/14  Yes Ladell Pier, MD  IRON PO Take 1 tablet by mouth daily.   Yes Historical Provider, MD  lidocaine-prilocaine (EMLA) cream Apply  topically as needed. Apply to [facility] site 1-2 hours prior to stick and cover with plastic wrap to numb site 08/16/13  Yes Owens Shark, NP  loperamide (IMODIUM) 2 MG capsule TAKE ONE CAPSULE BY MOUTH AS NEEDED FOR DIARRHEA OR LOOSE STOOLS. MAX OF 8 CAPSULES PER DAY Patient taking differently: Take 1 capsule by mouth as needed for diarrhea or loose stools. Max of 8 capsules per day. 05/17/15  Yes Ladell Pier, MD  LORazepam (ATIVAN) 0.5 MG tablet Place 1 tablet (0.5 mg total) under the tongue every 6 (six) hours as needed (for nausea). Causes drowsiness 05/08/15  Yes Owens Shark, NP  Multiple Vitamins-Minerals (MULTIVITAMIN) tablet Take 1 tablet by mouth daily. 01/22/14  Yes Ladell Pier, MD  naproxen sodium (ANAPROX) 220 MG tablet Take 220 mg by mouth 2 (two) times daily with a meal.   Yes Historical Provider, MD  polyethylene glycol powder (MIRALAX) powder Take 17 g by mouth daily as needed. 06/16/12  Yes Owens Shark, NP  potassium chloride SA (K-DUR,KLOR-CON) 20 MEQ tablet TAKE 1 TABLET BY MOUTH EVERY DAY 03/19/15  Yes Ladell Pier, MD  XARELTO 20 MG TABS tablet TAKE 1 TABLET BY MOUTH EVERY DAY Patient taking differently: Take 1 tablet by mouth every day. 05/20/15  Yes Ladell Pier, MD  ondansetron (ZOFRAN) 8 MG tablet TAKE 1 TABLET BY MOUTH EVERY 8 HOURS AS NEEDED FOR NAUSEA AND VOMITING Patient not taking: Reported on 06/12/2015 11/29/14   Ladell Pier, MD  prochlorperazine (COMPAZINE) 10 MG tablet TAKE 1 TABLET BY MOUTH EVERY 6 HOURS AS NEEDED FOR NAUSEA OR VOMITING Patient not taking: Reported on 05/08/2015 08/06/14   Owens Shark, NP   BP 143/95 mmHg  Pulse 88  Temp(Src) 98.2 F (36.8 C) (Oral)  Resp 16  SpO2 91% Physical Exam  Constitutional: She is oriented to person, place, and time. She appears well-developed and well-nourished.  HENT:  Head: Normocephalic.  Eyes: EOM are normal.  Neck: Normal range of motion.  Pulmonary/Chest: Effort normal.  Abdominal: She exhibits no  distension.  Musculoskeletal: Normal range of motion.  Full range of motion of right ankle, right knee, right hip.  Pain in anterior right thigh with active range of motion of the right knee only.  With passive range of motion there is no significant tenderness or pain.  Normal pulses in right foot.  No unilateral swelling of the right leg as compared to left.  No erythema or rash noted at the anterior right thigh.  Neurological: She is alert and oriented to person, place, and time.  Psychiatric: She has a normal mood  and affect.  Nursing note and vitals reviewed.   ED Course  Procedures (including critical care time) Labs Review Labs Reviewed - No data to display  Imaging Review Ct Femur Right Wo Contrast  06/12/2015  CLINICAL DATA:  60 year old female with right thigh and right leg pain. No injury. EXAM: CT OF THE RIGHT FEMUR WITHOUT CONTRAST TECHNIQUE: Multidetector CT imaging was performed according to the standard protocol. Multiplanar CT image reconstructions were also generated. COMPARISON:  Radiograph dated 05/22/2015 FINDINGS: There is a partially visualized 4.3 x 2.3 cm lucent lesion in the inferior aspect of the right iliac bone superior to the right acetabulum. There is associated destruction of the cortex of the bone suggestive of aggressive nature of the lesion. A second lesion is noted in the midportion of the right femur measuring approximately 1.7 x 6.6 cm. There is partial erosive changes of the cortex of the bone adjacent to this lesion. Findings are concerning for metastatic disease. Other etiologies are not excluded. Further evaluation with bone scan or PET-CT is recommended to evaluate for possible additional lesions. There is no acute fracture or dislocation. The soft tissues appear unremarkable. IMPRESSION: Lytic lesion involving the inferior aspect of the right iliac bone as well as a lesion involving the midportion of the right femur most concerning for metastatic disease.  Correlation with clinical exam and further evaluation with bone scan or PET-CT recommended. No acute fracture or dislocation. Electronically Signed   By: Anner Crete M.D.   On: 06/12/2015 21:30   Dg Knee Complete 4 Views Right  06/12/2015  CLINICAL DATA:  60 year old female with increasing right femoral pain and a history of metastatic colon cancer. EXAM: RIGHT KNEE - COMPLETE 4+ VIEW COMPARISON:  Concurrently obtained radiographs of the right femur; Prior radiographs of the pelvis and right hip 05/22/2015 FINDINGS: There is no evidence of fracture, dislocation, or joint effusion. There is no evidence of arthropathy or other focal bone abnormality. Soft tissues are unremarkable. IMPRESSION: Negative. Electronically Signed   By: Jacqulynn Cadet M.D.   On: 06/12/2015 20:18   Dg Femur, Min 2 Views Right  06/12/2015  CLINICAL DATA:  Increasing right femur pain x 1 month with great intensity x 2 days with out injury - metastatic colon cancer - pain concentrated proximal femur radiating to right knee EXAM: RIGHT FEMUR 2 VIEWS COMPARISON:  None. FINDINGS: No fracture. Knee joint normally aligned. Hip joint incompletely imaged. There is a subtle area of irregular widening of the medullary cavity of the mid femur without a discrete lesion. Mild resorption of the end ostium is suggested. Soft tissues are unremarkable. IMPRESSION: 1. No fracture. 2. Questionable bone lesion in the mid femur. This could be further assessed with CT or MRI or bone scan. Electronically Signed   By: Lajean Manes M.D.   On: 06/12/2015 20:18   I have personally reviewed and evaluated these images and lab results as part of my medical decision-making.   EKG Interpretation None      MDM   Final diagnoses:  None    Abnormal osseous lesion noted on plain film.  CT demonstrates concern for possible metastatic lesion in the right femur.  This information was passed on to the patient who will contact her primary oncologist  tomorrow for additional recommendations.  Her pain is improved in emergency department.  She will be discharged home with oxycodone.    Jola Schmidt, MD 06/13/15 0111

## 2015-06-12 NOTE — Telephone Encounter (Signed)
Per staff message and POF I have scheduled appts. Advised scheduler of appts. JMW  

## 2015-06-12 NOTE — ED Notes (Addendum)
Pt from home. Reports R leg pain from groin radiating down to lower leg. Also having same pain on L side, but not as bad. Pt denies any injury. Has been using cane to ambulate due to pain with movement. Pt being treated for lung ca, last chemo 3 weeks ago. Denies any long periods of immobility or sob. EMS gave 128mg fentanyl prior to arrival.

## 2015-06-12 NOTE — Progress Notes (Signed)
CSW met with patient at bedside. Patient states she lives at home with her daughter.  Patient gave CSW permission to reach out to daughter. CSW reached out to daughter and informed her that upon discharge patient will need transportaion. Daughter states that patient's son [name] will come get patient upon discharge.  Son/Phillip [phone]  Daughter/   [phone]  Willette Brace [phone] ED CSW 06/12/2015 10:00 PM

## 2015-06-12 NOTE — Telephone Encounter (Signed)
I have called patient with appts for tomorrow. Patient aware of the space between appts.

## 2015-06-13 ENCOUNTER — Ambulatory Visit: Payer: Medicare Other | Admitting: Nurse Practitioner

## 2015-06-13 ENCOUNTER — Other Ambulatory Visit: Payer: Medicare Other

## 2015-06-13 ENCOUNTER — Ambulatory Visit (HOSPITAL_BASED_OUTPATIENT_CLINIC_OR_DEPARTMENT_OTHER): Payer: Self-pay | Admitting: Nurse Practitioner

## 2015-06-13 ENCOUNTER — Encounter (HOSPITAL_COMMUNITY): Payer: Self-pay

## 2015-06-13 ENCOUNTER — Telehealth: Payer: Self-pay | Admitting: Nurse Practitioner

## 2015-06-13 ENCOUNTER — Inpatient Hospital Stay (HOSPITAL_COMMUNITY)
Admission: AD | Admit: 2015-06-13 | Discharge: 2015-06-18 | DRG: 481 | Disposition: A | Discharge status: Home Health | Payer: Medicare Other | Source: Ambulatory Visit | Attending: Orthopaedic Surgery | Admitting: Orthopaedic Surgery

## 2015-06-13 ENCOUNTER — Ambulatory Visit: Payer: Medicare Other

## 2015-06-13 ENCOUNTER — Other Ambulatory Visit (HOSPITAL_BASED_OUTPATIENT_CLINIC_OR_DEPARTMENT_OTHER): Payer: Medicare Other

## 2015-06-13 ENCOUNTER — Encounter: Payer: Self-pay | Admitting: *Deleted

## 2015-06-13 VITALS — BP 153/86 | HR 97 | Temp 98.3°F | Resp 18 | Ht 71.0 in | Wt 258.8 lb

## 2015-06-13 DIAGNOSIS — Z86718 Personal history of other venous thrombosis and embolism: Secondary | ICD-10-CM

## 2015-06-13 DIAGNOSIS — M898X5 Other specified disorders of bone, thigh: Secondary | ICD-10-CM

## 2015-06-13 DIAGNOSIS — Z419 Encounter for procedure for purposes other than remedying health state, unspecified: Secondary | ICD-10-CM

## 2015-06-13 DIAGNOSIS — Z90710 Acquired absence of both cervix and uterus: Secondary | ICD-10-CM | POA: Diagnosis not present

## 2015-06-13 DIAGNOSIS — C787 Secondary malignant neoplasm of liver and intrahepatic bile duct: Secondary | ICD-10-CM | POA: Diagnosis present

## 2015-06-13 DIAGNOSIS — M79604 Pain in right leg: Secondary | ICD-10-CM

## 2015-06-13 DIAGNOSIS — Z8249 Family history of ischemic heart disease and other diseases of the circulatory system: Secondary | ICD-10-CM

## 2015-06-13 DIAGNOSIS — M84451A Pathological fracture, right femur, initial encounter for fracture: Secondary | ICD-10-CM | POA: Diagnosis present

## 2015-06-13 DIAGNOSIS — I10 Essential (primary) hypertension: Secondary | ICD-10-CM | POA: Diagnosis present

## 2015-06-13 DIAGNOSIS — C7800 Secondary malignant neoplasm of unspecified lung: Secondary | ICD-10-CM

## 2015-06-13 DIAGNOSIS — C187 Malignant neoplasm of sigmoid colon: Secondary | ICD-10-CM | POA: Diagnosis present

## 2015-06-13 DIAGNOSIS — C7802 Secondary malignant neoplasm of left lung: Secondary | ICD-10-CM | POA: Diagnosis present

## 2015-06-13 DIAGNOSIS — C7801 Secondary malignant neoplasm of right lung: Secondary | ICD-10-CM | POA: Diagnosis present

## 2015-06-13 DIAGNOSIS — Z9049 Acquired absence of other specified parts of digestive tract: Secondary | ICD-10-CM

## 2015-06-13 DIAGNOSIS — C189 Malignant neoplasm of colon, unspecified: Secondary | ICD-10-CM | POA: Diagnosis present

## 2015-06-13 DIAGNOSIS — Z923 Personal history of irradiation: Secondary | ICD-10-CM

## 2015-06-13 DIAGNOSIS — C7951 Secondary malignant neoplasm of bone: Secondary | ICD-10-CM | POA: Diagnosis present

## 2015-06-13 DIAGNOSIS — D62 Acute posthemorrhagic anemia: Secondary | ICD-10-CM | POA: Diagnosis not present

## 2015-06-13 DIAGNOSIS — M899 Disorder of bone, unspecified: Secondary | ICD-10-CM

## 2015-06-13 DIAGNOSIS — Z6836 Body mass index (BMI) 36.0-36.9, adult: Secondary | ICD-10-CM

## 2015-06-13 DIAGNOSIS — D509 Iron deficiency anemia, unspecified: Secondary | ICD-10-CM

## 2015-06-13 DIAGNOSIS — G620 Drug-induced polyneuropathy: Secondary | ICD-10-CM

## 2015-06-13 LAB — COMPREHENSIVE METABOLIC PANEL
ALK PHOS: 187 U/L — AB (ref 40–150)
ALT: 33 U/L (ref 0–55)
AST: 42 U/L — AB (ref 5–34)
Albumin: 4.2 g/dL (ref 3.5–5.0)
Anion Gap: 12 mEq/L — ABNORMAL HIGH (ref 3–11)
BILIRUBIN TOTAL: 0.8 mg/dL (ref 0.20–1.20)
BUN: 12.7 mg/dL (ref 7.0–26.0)
CHLORIDE: 101 meq/L (ref 98–109)
CO2: 26 mEq/L (ref 22–29)
Calcium: 11 mg/dL — ABNORMAL HIGH (ref 8.4–10.4)
Creatinine: 1.1 mg/dL (ref 0.6–1.1)
EGFR: 61 mL/min/{1.73_m2} — ABNORMAL LOW (ref >90)
GLUCOSE: 110 mg/dL (ref 70–140)
POTASSIUM: 4 meq/L (ref 3.5–5.1)
SODIUM: 139 meq/L (ref 136–145)
Total Protein: 8.9 g/dL — ABNORMAL HIGH (ref 6.4–8.3)

## 2015-06-13 LAB — UA PROTEIN, DIPSTICK - CHCC

## 2015-06-13 LAB — CBC WITH DIFFERENTIAL/PLATELET
BASO%: 0.8 % (ref 0.0–2.0)
BASOS ABS: 0.1 10*3/uL (ref 0.0–0.1)
EOS%: 0.7 % (ref 0.0–7.0)
Eosinophils Absolute: 0.1 10*3/uL (ref 0.0–0.5)
HCT: 36.6 % (ref 34.8–46.6)
HGB: 11.9 g/dL (ref 11.6–15.9)
LYMPH%: 20 % (ref 14.0–49.7)
MCH: 29.8 pg (ref 25.1–34.0)
MCHC: 32.5 g/dL (ref 31.5–36.0)
MCV: 91.6 fL (ref 79.5–101.0)
MONO#: 1.3 10*3/uL — ABNORMAL HIGH (ref 0.1–0.9)
MONO%: 18.1 % — AB (ref 0.0–14.0)
NEUT#: 4.5 10*3/uL (ref 1.5–6.5)
NEUT%: 60.4 % (ref 38.4–76.8)
Platelets: 202 10*3/uL (ref 145–400)
RBC: 4 10*6/uL (ref 3.70–5.45)
RDW: 19.1 % — AB (ref 11.2–14.5)
WBC: 7.4 10*3/uL (ref 3.9–10.3)
lymph#: 1.5 10*3/uL (ref 0.9–3.3)

## 2015-06-13 MED ORDER — POLYETHYLENE GLYCOL 3350 17 GM/SCOOP PO POWD
17.0000 g | Freq: Every day | ORAL | Status: DC | PRN
  Administered 2015-06-15: 17 g via ORAL
  Filled 2015-06-13: qty 255

## 2015-06-13 MED ORDER — MORPHINE SULFATE (PF) 4 MG/ML IV SOLN
4.0000 mg | Freq: Once | INTRAVENOUS | Status: AC
  Administered 2015-06-13: 4 mg via INTRAVENOUS
  Filled 2015-06-13: qty 1

## 2015-06-13 MED ORDER — MORPHINE SULFATE (PF) 2 MG/ML IV SOLN
2.0000 mg | INTRAVENOUS | Status: DC | PRN
  Administered 2015-06-13 – 2015-06-14 (×4): 2 mg via INTRAVENOUS
  Filled 2015-06-13 (×5): qty 1

## 2015-06-13 MED ORDER — PROMETHAZINE HCL 25 MG PO TABS: 12.5000 mg | ORAL_TABLET | Freq: Four times a day (QID) | ORAL | Status: DC | PRN

## 2015-06-13 MED ORDER — AMLODIPINE BESYLATE 10 MG PO TABS
10.0000 mg | ORAL_TABLET | Freq: Every day | ORAL | Status: DC
  Administered 2015-06-13 – 2015-06-18 (×4): 10 mg via ORAL
  Filled 2015-06-13 (×6): qty 1

## 2015-06-13 MED ORDER — LIDOCAINE-PRILOCAINE 2.5-2.5 % EX CREA: TOPICAL_CREAM | CUTANEOUS | Status: DC | PRN

## 2015-06-13 MED ORDER — ALUM & MAG HYDROXIDE-SIMETH 200-200-20 MG/5ML PO SUSP: 30.0000 mL | Freq: Four times a day (QID) | ORAL | Status: DC | PRN

## 2015-06-13 MED ORDER — SODIUM CHLORIDE 0.9 % IV SOLN
INTRAVENOUS | Status: DC
  Administered 2015-06-13 – 2015-06-15 (×2): via INTRAVENOUS

## 2015-06-13 MED ORDER — SODIUM CHLORIDE 0.9% FLUSH
10.0000 mL | INTRAVENOUS | Status: DC | PRN
  Administered 2015-06-14 – 2015-06-18 (×5): 10 mL
  Filled 2015-06-13 (×5): qty 40

## 2015-06-13 MED ORDER — OXYCODONE-ACETAMINOPHEN 5-325 MG PO TABS
1.0000 | ORAL_TABLET | ORAL | Status: DC | PRN
  Administered 2015-06-13 (×2): 1 via ORAL
  Filled 2015-06-13 (×2): qty 1

## 2015-06-13 MED FILL — OXYCODONE/APAP 5/325MG: 5-325 | 7 days supply | Qty: 30 | Fill #0

## 2015-06-13 NOTE — Progress Notes (Signed)
[place] OFFICE PROGRESS NOTE   Diagnosis:  Colon cancer  INTERVAL HISTORY:   [name] returns as scheduled. She completed cycle 5 FOLFOX/Avastin 05/22/2015. She had mild nausea for a few days after the chemotherapy. The nausea was relieved with her home antirheumatic. No vomiting. No mouth sores. No diarrhea. She denies any bleeding. No shortness of breath or chest pain.  She was seen in the emergency department yesterday for evaluation of right leg pain. She describes the pain as "achy". She tried Aleve with no improvement. She then decided to go to the emergency department. She reports a CT scan showed a "spot" in the right femur.  Objective:  Vital signs in last 24 hours:  Blood pressure 153/86, pulse 97, temperature 98.3 F (36.8 C), temperature source Oral, resp. rate 18, height _0  (1.803 m), weight 258 lb 12.8 oz (117.391 kg), SpO2 100 %.    HEENT: No thrush or ulcers. Resp: Lungs clear bilaterally. Cardio: Regular rate and rhythm. GI: Abdomen soft and nontender. No hepatomegaly. Vascular: No leg edema. Port-A-Cath without erythema.    Lab Results:  Lab Results  Component Value Date   WBC 7.4 06/13/2015   HGB 11.9 06/13/2015   HCT 36.6 06/13/2015   MCV 91.6 06/13/2015   PLT 202 06/13/2015   NEUTROABS 4.5 06/13/2015    Imaging:  Ct Femur Right Wo Contrast  06/12/2015  CLINICAL DATA:  60 year old female with right thigh and right leg pain. No injury. EXAM: CT OF THE RIGHT FEMUR WITHOUT CONTRAST TECHNIQUE: Multidetector CT imaging was performed according to the standard protocol. Multiplanar CT image reconstructions were also generated. COMPARISON:  Radiograph dated 05/22/2015 FINDINGS: There is a partially visualized 4.3 x 2.3 cm lucent lesion in the inferior aspect of the right iliac bone superior to the right acetabulum. There is associated destruction of the cortex of the bone suggestive of aggressive nature of the lesion. A second lesion is  noted in the midportion of the right femur measuring approximately 1.7 x 6.6 cm. There is partial erosive changes of the cortex of the bone adjacent to this lesion. Findings are concerning for metastatic disease. Other etiologies are not excluded. Further evaluation with bone scan or PET-CT is recommended to evaluate for possible additional lesions. There is no acute fracture or dislocation. The soft tissues appear unremarkable. IMPRESSION: Lytic lesion involving the inferior aspect of the right iliac bone as well as a lesion involving the midportion of the right femur most concerning for metastatic disease. Correlation with clinical exam and further evaluation with bone scan or PET-CT recommended. No acute fracture or dislocation. Electronically Signed   By: Anner Crete M.D.   On: 06/12/2015 21:30   Dg Knee Complete 4 Views Right  06/12/2015  CLINICAL DATA:  60 year old female with increasing right femoral pain and a history of metastatic colon cancer. EXAM: RIGHT KNEE - COMPLETE 4+ VIEW COMPARISON:  Concurrently obtained radiographs of the right femur; Prior radiographs of the pelvis and right hip 05/22/2015 FINDINGS: There is no evidence of fracture, dislocation, or joint effusion. There is no evidence of arthropathy or other focal bone abnormality. Soft tissues are unremarkable. IMPRESSION: Negative. Electronically Signed   By: Jacqulynn Cadet M.D.   On: 06/12/2015 20:18   Dg Femur, Min 2 Views Right  06/12/2015  CLINICAL DATA:  Increasing right femur pain x 1 month with great intensity x 2 days with out injury - metastatic colon cancer - pain concentrated proximal femur radiating to right knee  EXAM: RIGHT FEMUR 2 VIEWS COMPARISON:  None. FINDINGS: No fracture. Knee joint normally aligned. Hip joint incompletely imaged. There is a subtle area of irregular widening of the medullary cavity of the mid femur without a discrete lesion. Mild resorption of the end ostium is suggested. Soft tissues are  unremarkable. IMPRESSION: 1. No fracture. 2. Questionable bone lesion in the mid femur. This could be further assessed with CT or MRI or bone scan. Electronically Signed   By: Lajean Manes M.D.   On: 06/12/2015 20:18    Medications: I have reviewed the patient's current medications.  Assessment/Plan: 1. Metastatic colon cancer (T4, N0, M1) adenocarcinoma of the sigmoid colon status post sigmoid colectomy and partial right hepatectomy 04/11/2012. Microsatellite stable. No loss of mismatch repair proteins. K-ras wild-type on the initial codon 12 and 13 testing. K-ras Q61L and APC mutations identified on extended testing. BRAF not mutated.  She completed cycle 1 CAPOX beginning 05/25/2012.   She completed cycle 8 CAPOX beginning 10/19/2012.   CEA on 10/19/2012 normal at 0.8.   CEA 6.1 on 01/11/2013   Restaging CT on 01/11/2013 consistent with multiple new pulmonary metastases   Cycle 1 of FOLFIRI/Avastin 01/18/2013; cycle 2 02/01/2013,cycle 3 02/16/2013.   CEA 5.0 on 03/01/2013.   Cycle 4 FOLFIRI/Avastin 03/01/2013.   Cycle 5 FOLFIRI/Avastin 03/15/2013.   Restaging chest CT 03/29/2013 with possible slight decrease in size of a left upper lobe metastasis. Other pulmonary nodules stable.   Cycle 6 FOLFIRI/Avastin 03/29/2013   Cycle 7 of FOLFIRI/Avastin 04/12/2013.   CEA 6.3 05/03/2013.   Cycle 8 FOLFIRI/Avastin 05/03/2013.   Cycle 9 FOLFIRI/Avastin 05/17/2013.   Cycle 10 FOLFIRI/Avastin 05/31/2013.   CEA 10.6 on 06/19/2013.   CT chest 06/19/2013 with mild progression of bilateral pulmonary nodules/metastases.   Decision made to continue FOLFIRI/Avastin. She completed cycle 11 06/21/2013.   Cycle 12 FOLFIRI/Avastin 07/05/2013   Cycle 13 FOLFIRI/Avastin 07/19/2013   Cycle 14 FOLFIRI/Avastin 08/01/2013.   CT chest 08/28/2013 with increased in size of bilateral lung nodules.   PET scan 09/22/2013 with multiple hypermetabolic pulmonary nodules  bilaterally. Nodules demonstrate progressive enlargement compared with prior CTs. No evidence of extrathoracic metastatic disease.   Status post SRS 2 lung nodules, completed 10/18/2013  CT 01/12/2014 with airspace disease surrounding treated pulmonary nodules, no new nodules  01/12/2014 CEA 0.9  CT chest 04/12/2014 with improvement in airspace disease surrounding treated pulmonary nodules, no new nodules  04/12/2014 CEA 8.2  07/03/2014 CEA 60.3  CT 07/03/2014 with masslike enlargement of 2 treated lung lesions  Cycle 1 Lonsurf 07/12/2014.  Cycle 2 Lonsurf 08/11/2014 or 08/12/2014  Cycle 3 Lonsurf 09/08/2014  Cycle 4 Lonsurf 10/06/2014  Restaging CT of the chest on 10/01/2014 with stable lung nodules  Cycle 5 Lonsurf 11/05/2014  Cycle 6 Lonsurf 12/03/2014  Restaging CTs of the chest, abdomen, and pelvis on 12/26/2014 with no evidence of disease progression, stable lung nodules  Cycle 7 Lonsurf 12/31/2014  Cycle 8 Lonsurf 01/28/2015  Elevated CEA 03/12/2015  CT 03/19/2015 with enlargement of bilateral lung masses and progressive bone metastases including an enlarging lesion at T6 with replacement of the vertebral body  Cycle 1 FOLFOX/Avastin 03/25/2015  Cycle 2 FOLFOX/Avastin 04/08/2015  Cycle 3 FOLFOX/Avastin 04/23/2015  Cycle 4 FOLFOX/Avastin 05/08/2015  Cycle 5 FOLFOX/Avastin 05/22/2015  Restaging CT scans 06/10/2015 with decreased size of bilateral pulmonary metastases; stable hepatic steatosis and postoperative changes from partial hepatectomy; no definite liver metastases; mild increase in size of lytic bone metastasis in the right ilium; stable  T6 vertebral body lytic metastasis 2. Microcytic anemia, iron deficiency. Improved.  3. Status post Port-A-Cath placement 03/14/2012. X-ray showed the Port-A-Cath tip to be in the azygos vein. The left-sided Port-A-Cath was removed and a new right Port-A-Cath was placed on 05/23/2012. 4. 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 5. Hypertension-persistent on amlodipine. HCTZ added 12/26/2014 6. Delayed nausea following cycle 5 FOLFIRI/Avastin. Emend was added to the premedication regimen 7. Nausea secondary to Lonsurf-improved with Ativan 8. Mild oxaliplatin neuropathy 9. Right groin pain. Question referred pain from the hip. 10. CT right femur 06/12/2015 with a partially visualized 4.3 x 2.3 cm lucent lesion in the inferior aspect of the right iliac bone superior to the right acetabulum. Associated destruction of the cortex of the bone; second lesion noted in the midportion of the right femur measuring 1.7 x 6.6 cm. Partial erosive changes of the cortex of the bone adjacent to the lesion.   Disposition: [name] has completed 5 cycles of FOLFOX. Restaging CT evaluation shows a mixed response with improvement in the lungs and progression in the bones. CT of the right femur done yesterday in the emergency department showed a lesion in the right femur with erosive changes of the cortex.  She is at risk to fracture the femur. We instructed her on nonweightbearing status. Dr. Benay Spice has spoken with Dr. Rush Farmer. She will be admitted for pain control and orthopedic evaluation.  Patient seen with Dr. Benay Spice.  Ned Card ANP/GNP-BC   06/13/2015  11:39 AM

## 2015-06-13 NOTE — Progress Notes (Signed)
  Oncology Nurse Navigator Documentation  Navigator Location: [facility]-Med Onc (06/13/15 1230) Navigator Encounter Type: Follow-up Appt (06/13/15 1230)           Patient Visit Type: MedOnc (06/13/15 1230) Treatment Phase: Treatment (06/13/15 1230) Barriers/Navigation Needs: Education (06/13/15 1230) Education: Other (06/13/15 1230)--discussed that she will need pin/rod placed in femur to prevent fracture-she needs to avoid weight bearing on this leg. Will most likely need home PT/OT after surgery. There is concern with her home situation-daughter is there to help, but her bedroom and bathroom are upstairs. Encouraged her that we will be checking on her.

## 2015-06-13 NOTE — H&P (Signed)
Admission History and Physical      Chief Complaint: Right leg pain HPI: [name] is a 60 year old woman with metastatic colon cancer involving the lungs, liver and bone. She has most recently been treated with FOLFOX/Avastin completing cycle 5 on 05/22/2015. Restaging CT scans 06/10/2015 showed improvement in lung metastases and mild increase in the size of a lytic bone metastasis in the right ileum.  She was seen in the emergency department 06/12/2015 for evaluation of right leg pain. CT of the right femur showed a lesion in the inferior aspect of the right iliac bone superior to the right acetabulum and a second lesion in the midportion of the right femur measuring 1.7 x 6.6 cm with partial erosive changes of the cortex.  Due to concern for an impending fracture Dr. Benay Spice contacted Dr. Rush Farmer. She is being admitted to [facility] for pain control, orthopedic evaluation/intervention.    Past Medical History  Diagnosis Date  . Hypertension     pt states she stopped taking meds while in her 20's  . Blood in stool   . History of blood transfusion   . Anemia   . Eczema   . Heart murmur     does not see a cardiologist has been told she does has a murmur and also that she does not have a murmur  . Anxiety   . Neuromuscular disorder (HCC)     numbness feet  . Hx of radiation therapy 10/06/13,16th,19th,22nd,&10/18/13    SBRT lung  . Cancer ([place])   . Metastatic colon cancer to liver [facility])     Past Surgical History  Procedure Laterality Date  . Breast lumpectomy      left breast  . Colonoscopy  02/18/2012    Procedure: COLONOSCOPY;  Surgeon: Beryle Beams, MD;  Location: [place];  Service: Endoscopy;  Laterality: N/A;  . Portacath placement  03/14/2012    Procedure: INSERTION PORT-A-CATH;  Surgeon: Ralene Ok, MD;  Location: [place];  Service: General;  Laterality: N/A;  . Laparoscopic sigmoid colectomy  04/11/2012    Procedure: LAPAROSCOPIC SIGMOID COLECTOMY;   Surgeon: Stark Klein, MD;  Location: [place];  Service: General;;  converted to Open with splenic flexure take down  . Open partial hepatectomy [83]  04/11/2012    Procedure: OPEN PARTIAL HEPATECTOMY [83];  Surgeon: Stark Klein, MD;  Location: [place];  Service: General;  Laterality: Right;  . Laparoscopy  04/11/2012    Procedure: LAPAROSCOPY DIAGNOSTIC;  Surgeon: Stark Klein, MD;  Location: [place];  Service: General;  Laterality: N/A;  . Cholecystectomy  04/11/2012    Procedure: CHOLECYSTECTOMY;  Surgeon: Stark Klein, MD;  Location: [place];  Service: General;;  . Ileocecetomy  04/11/2012    Procedure: Pennie Rushing;  Surgeon: Stark Klein, MD;  Location: [place];  Service: General;  Laterality: N/A;  . Port a cath revision  05/23/2012    Procedure: PORT A CATH REVISION;  Surgeon: Ralene Ok, MD;  Location: [place];  Service: General;  Laterality: Right;  Port-A-Cath repositioning versus replacement  . Abdominal hysterectomy      early 50's   Current medications:  Norvasc 10 mg daily Calcium plus vitamin D 1 tablet daily Hydrochlorothiazide 12.5 mg daily Emla cream as needed Imodium as needed Ativan 0.5 mg every 6 hours as needed Multivitamin daily Naproxen 220 mg twice daily as needed Zofran 8 mg every 8 hours as needed Percocet 5/325 one tablet every 4 hours as needed Miralax daily as needed Kdur 20  mEq daily Compazine 10 mg every 6 hours as needed Xarelto 20 mg daily   No Known Allergies  Family History  Problem Relation Age of Onset  . Hypertension Mother   . Cancer Mother 40    breast ca  . Heart disease Father      reports that she has never smoked. She has never used smokeless tobacco. She reports that she does not drink alcohol or use illicit drugs.  ROS: Recent onset of right lateral upper leg pain. She describes the pain as "aching". She tried Aleve with no improvement. The pain prompted her recent visit to the emergency department. With ambulation the right leg  feels "weak". She has been using a cane. She denies shortness of breath and chest pain. No diarrhea. No mouth sores. No nausea except for a few days following each chemotherapy. No bleeding.  Physical:  There were no vitals taken for this visit.  General: Pleasant female in no acute distress. HEENT: No thrush or ulcers. Mucous membranes appear moist. Chest: Lungs clear bilaterally. Cardiovascular: Regular rate and rhythm. Abdomen: Abdomen soft and nontender. No hepatomegaly. Extremities: No leg edema. Neuro: Alert and oriented. Follows commands. Port-A-Cath without erythema.  Labs:  Results for orders placed or performed in visit on 06/13/15 (from the past 48 hour(s))  CBC with Differential     Status: Abnormal   Collection Time: 06/13/15 11:04 AM  Result Value Ref Range   WBC 7.4 3.9 - 10.3 10e3/uL   NEUT# 4.5 1.5 - 6.5 10e3/uL   HGB 11.9 11.6 - 15.9 g/dL   HCT 36.6 34.8 - 46.6 %   Platelets 202 145 - 400 10e3/uL   MCV 91.6 79.5 - 101.0 fL   MCH 29.8 25.1 - 34.0 pg   MCHC 32.5 31.5 - 36.0 g/dL   RBC 4.00 3.70 - 5.45 10e6/uL   RDW 19.1 (H) 11.2 - 14.5 %   lymph# 1.5 0.9 - 3.3 10e3/uL   MONO# 1.3 (H) 0.1 - 0.9 10e3/uL   Eosinophils Absolute 0.1 0.0 - 0.5 10e3/uL   Basophils Absolute 0.1 0.0 - 0.1 10e3/uL   NEUT% 60.4 38.4 - 76.8 %   LYMPH% 20.0 14.0 - 49.7 %   MONO% 18.1 (H) 0.0 - 14.0 %   EOS% 0.7 0.0 - 7.0 %   BASO% 0.8 0.0 - 2.0 %  Comprehensive metabolic panel     Status: Abnormal   Collection Time: 06/13/15 11:04 AM  Result Value Ref Range   Sodium 139 136 - 145 mEq/L   Potassium 4.0 3.5 - 5.1 mEq/L   Chloride 101 98 - 109 mEq/L   CO2 26 22 - 29 mEq/L   Glucose 110 70 - 140 mg/dl    Comment: Glucose reference range is for nonfasting patients. Fasting glucose reference range is 70- 100.   BUN 12.7 7.0 - 26.0 mg/dL   Creatinine 1.1 0.6 - 1.1 mg/dL   Total Bilirubin 0.80 0.20 - 1.20 mg/dL   Alkaline Phosphatase 187 (H) 40 - 150 U/L   AST 42 (H) 5 - 34 U/L   ALT  33 0 - 55 U/L   Total Protein 8.9 (H) 6.4 - 8.3 g/dL   Albumin 4.2 3.5 - 5.0 g/dL   Calcium 11.0 (H) 8.4 - 10.4 mg/dL   Anion Gap 12 (H) 3 - 11 mEq/L   EGFR 61 (L) >90 ml/min/1.73 m2    Comment: eGFR is calculated using the CKD-EPI Creatinine Equation (2009)  Urine protein by dipstick - [facility]  Status: None   Collection Time: 06/13/15 11:04 AM  Result Value Ref Range   Protein, ur < 30 Negative- <30 mg/dL   Ct Femur Right Wo Contrast  06/12/2015  CLINICAL DATA:  60 year old female with right thigh and right leg pain. No injury. EXAM: CT OF THE RIGHT FEMUR WITHOUT CONTRAST TECHNIQUE: Multidetector CT imaging was performed according to the standard protocol. Multiplanar CT image reconstructions were also generated. COMPARISON:  Radiograph dated 05/22/2015 FINDINGS: There is a partially visualized 4.3 x 2.3 cm lucent lesion in the inferior aspect of the right iliac bone superior to the right acetabulum. There is associated destruction of the cortex of the bone suggestive of aggressive nature of the lesion. A second lesion is noted in the midportion of the right femur measuring approximately 1.7 x 6.6 cm. There is partial erosive changes of the cortex of the bone adjacent to this lesion. Findings are concerning for metastatic disease. Other etiologies are not excluded. Further evaluation with bone scan or PET-CT is recommended to evaluate for possible additional lesions. There is no acute fracture or dislocation. The soft tissues appear unremarkable. IMPRESSION: Lytic lesion involving the inferior aspect of the right iliac bone as well as a lesion involving the midportion of the right femur most concerning for metastatic disease. Correlation with clinical exam and further evaluation with bone scan or PET-CT recommended. No acute fracture or dislocation. Electronically Signed   By: Anner Crete M.D.   On: 06/12/2015 21:30   Dg Knee Complete 4 Views Right  06/12/2015  CLINICAL DATA:  60 year old  female with increasing right femoral pain and a history of metastatic colon cancer. EXAM: RIGHT KNEE - COMPLETE 4+ VIEW COMPARISON:  Concurrently obtained radiographs of the right femur; Prior radiographs of the pelvis and right hip 05/22/2015 FINDINGS: There is no evidence of fracture, dislocation, or joint effusion. There is no evidence of arthropathy or other focal bone abnormality. Soft tissues are unremarkable. IMPRESSION: Negative. Electronically Signed   By: Jacqulynn Cadet M.D.   On: 06/12/2015 20:18   Dg Femur, Min 2 Views Right  06/12/2015  CLINICAL DATA:  Increasing right femur pain x 1 month with great intensity x 2 days with out injury - metastatic colon cancer - pain concentrated proximal femur radiating to right knee EXAM: RIGHT FEMUR 2 VIEWS COMPARISON:  None. FINDINGS: No fracture. Knee joint normally aligned. Hip joint incompletely imaged. There is a subtle area of irregular widening of the medullary cavity of the mid femur without a discrete lesion. Mild resorption of the end ostium is suggested. Soft tissues are unremarkable. IMPRESSION: 1. No fracture. 2. Questionable bone lesion in the mid femur. This could be further assessed with CT or MRI or bone scan. Electronically Signed   By: Lajean Manes M.D.   On: 06/12/2015 20:18    Assessment/Plan  1. Metastatic colon cancer (T4, N0, M1) adenocarcinoma of the sigmoid colon status post sigmoid colectomy and partial right hepatectomy 04/11/2012. Microsatellite stable. No loss of mismatch repair proteins. K-ras wild-type on the initial codon 12 and 13 testing. K-ras Q61L and APC mutations identified on extended testing. BRAF not mutated.  She completed cycle 1 CAPOX beginning 05/25/2012.   She completed cycle 8 CAPOX beginning 10/19/2012.   CEA on 10/19/2012 normal at 0.8.   CEA 6.1 on 01/11/2013   Restaging CT on 01/11/2013 consistent with multiple new pulmonary metastases   Cycle 1 of FOLFIRI/Avastin 01/18/2013; cycle 2  02/01/2013,cycle 3 02/16/2013.   CEA 5.0 on 03/01/2013.  Cycle 4 FOLFIRI/Avastin 03/01/2013.   Cycle 5 FOLFIRI/Avastin 03/15/2013.   Restaging chest CT 03/29/2013 with possible slight decrease in size of a left upper lobe metastasis. Other pulmonary nodules stable.   Cycle 6 FOLFIRI/Avastin 03/29/2013   Cycle 7 of FOLFIRI/Avastin 04/12/2013.   CEA 6.3 05/03/2013.   Cycle 8 FOLFIRI/Avastin 05/03/2013.   Cycle 9 FOLFIRI/Avastin 05/17/2013.   Cycle 10 FOLFIRI/Avastin 05/31/2013.   CEA 10.6 on 06/19/2013.   CT chest 06/19/2013 with mild progression of bilateral pulmonary nodules/metastases.   Decision made to continue FOLFIRI/Avastin. She completed cycle 11 06/21/2013.   Cycle 12 FOLFIRI/Avastin 07/05/2013   Cycle 13 FOLFIRI/Avastin 07/19/2013   Cycle 14 FOLFIRI/Avastin 08/01/2013.   CT chest 08/28/2013 with increased in size of bilateral lung nodules.   PET scan 09/22/2013 with multiple hypermetabolic pulmonary nodules bilaterally. Nodules demonstrate progressive enlargement compared with prior CTs. No evidence of extrathoracic metastatic disease.   Status post SRS 2 lung nodules, completed 10/18/2013  CT 01/12/2014 with airspace disease surrounding treated pulmonary nodules, no new nodules  01/12/2014 CEA 0.9  CT chest 04/12/2014 with improvement in airspace disease surrounding treated pulmonary nodules, no new nodules  04/12/2014 CEA 8.2  07/03/2014 CEA 60.3  CT 07/03/2014 with masslike enlargement of 2 treated lung lesions  Cycle 1 Lonsurf 07/12/2014.  Cycle 2 Lonsurf 08/11/2014 or 08/12/2014  Cycle 3 Lonsurf 09/08/2014  Cycle 4 Lonsurf 10/06/2014  Restaging CT of the chest on 10/01/2014 with stable lung nodules  Cycle 5 Lonsurf 11/05/2014  Cycle 6 Lonsurf 12/03/2014  Restaging CTs of the chest, abdomen, and pelvis on 12/26/2014 with no evidence of disease progression, stable lung nodules  Cycle 7 Lonsurf 12/31/2014  Cycle 8  Lonsurf 01/28/2015  Elevated CEA 03/12/2015  CT 03/19/2015 with enlargement of bilateral lung masses and progressive bone metastases including an enlarging lesion at T6 with replacement of the vertebral body  Cycle 1 FOLFOX/Avastin 03/25/2015  Cycle 2 FOLFOX/Avastin 04/08/2015  Cycle 3 FOLFOX/Avastin 04/23/2015  Cycle 4 FOLFOX/Avastin 05/08/2015  Cycle 5 FOLFOX/Avastin 05/22/2015  Restaging CT scans 06/10/2015 with decreased size of bilateral pulmonary metastases; stable hepatic steatosis and postoperative changes from partial hepatectomy; no definite liver metastases; mild increase in size of lytic bone metastasis in the right ilium; stable T6 vertebral body lytic metastasis 2. Microcytic anemia, iron deficiency. Improved.  3. Status post Port-A-Cath placement 03/14/2012. X-ray showed the Port-A-Cath tip to be in the azygos vein. The left-sided Port-A-Cath was removed and a new right Port-A-Cath was placed on 05/23/2012. 4. 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 5. Hypertension-persistent on amlodipine. HCTZ added 12/26/2014 6. Delayed nausea following cycle 5 FOLFIRI/Avastin. Emend was added to the premedication regimen 7. Nausea secondary to Lonsurf-improved with Ativan 8. Mild oxaliplatin neuropathy 9. Right groin pain. Question referred pain from the hip. 10. CT right femur 06/12/2015 with a partially visualized 4.3 x 2.3 cm lucent lesion in the inferior aspect of the right iliac bone superior to the right acetabulum. Associated destruction of the cortex of the bone; second lesion noted in the midportion of the right femur measuring 1.7 x 6.6 cm. Partial erosive changes of the cortex of the bone adjacent to the lesion.  Plan: [name] has metastatic colon cancer. The recent restaging CT evaluation shows a mixed response with improvement in the lungs and progression in the bones. CT of the right femur done  yesterday showed a lesion in the right femur with erosive changes of the cortex. She is at  risk to fracture the femur.  She is being admitted to [facility] for pain control and orthopedic evaluation.  Ned Card ANP/GNP-BC 06/13/2015, 12:51 PM  [name] has a symptomatic metastasis at the right femur. I discussed the case with Dr. Ninfa Linden. She is at risk for fracture. She will be admitted for surgical stabilization. We will then refer her for palliative radiation.  [name] was last treated with Avastin approximately 3 weeks ago. She took Xarelto this morning. Xarelto will be placed on hold..  The plan is to discontinue FOLFOX/Avastin chemotherapy.

## 2015-06-13 NOTE — Telephone Encounter (Signed)
As per Butch Penny SW called to get transportation voucher for Pt. To go to an Orthopedic appointment and to home. Pt. Can't bear weight on Right Leg informed her Pt. Arrived by [facility] today and Dr. Benay Spice stated he doesn't want to wait for [facility] to take Pt. To this appointment. Abby Potash also informed Pt. Is feeling a little down.

## 2015-06-13 NOTE — Progress Notes (Signed)
Pt. Admitted to [place] Brief report given to receiving Nurse Amy RN. Pt. Transported to admitting to be transported to the room.

## 2015-06-14 LAB — COMPREHENSIVE METABOLIC PANEL
ALBUMIN: 4 g/dL (ref 3.5–5.0)
ALT: 28 U/L (ref 14–54)
AST: 38 U/L (ref 15–41)
Alkaline Phosphatase: 142 U/L — ABNORMAL HIGH (ref 38–126)
Anion gap: 11 (ref 5–15)
BUN: 16 mg/dL (ref 6–20)
CHLORIDE: 101 mmol/L (ref 101–111)
CO2: 27 mmol/L (ref 22–32)
Calcium: 9.8 mg/dL (ref 8.9–10.3)
Creatinine, Ser: 0.99 mg/dL (ref 0.44–1.00)
GFR calc Af Amer: >60 mL/min (ref >60)
GFR calc non Af Amer: >60 mL/min (ref >60)
GLUCOSE: 117 mg/dL — AB (ref 65–99)
POTASSIUM: 3.9 mmol/L (ref 3.5–5.1)
SODIUM: 139 mmol/L (ref 135–145)
Total Bilirubin: 1 mg/dL (ref 0.3–1.2)
Total Protein: 7.9 g/dL (ref 6.5–8.1)

## 2015-06-14 LAB — CEA

## 2015-06-14 MED ORDER — METHOCARBAMOL 500 MG PO TABS
500.0000 mg | ORAL_TABLET | Freq: Four times a day (QID) | ORAL | Status: DC | PRN
  Administered 2015-06-16 – 2015-06-17 (×3): 500 mg via ORAL
  Filled 2015-06-14 (×2): qty 1

## 2015-06-14 MED ORDER — OXYCODONE HCL 5 MG PO TABS
5.0000 mg | ORAL_TABLET | ORAL | Status: DC | PRN
  Administered 2015-06-14 – 2015-06-18 (×14): 15 mg via ORAL
  Filled 2015-06-14: qty 3
  Filled 2015-06-14: qty 2
  Filled 2015-06-14 (×13): qty 3

## 2015-06-14 MED ORDER — MORPHINE SULFATE (PF) 2 MG/ML IV SOLN: 2.0000 mg | INTRAVENOUS | Status: DC | PRN

## 2015-06-14 MED ORDER — OXYCODONE-ACETAMINOPHEN 5-325 MG PO TABS: 1.0000 | ORAL_TABLET | ORAL | Status: DC | PRN

## 2015-06-14 NOTE — Progress Notes (Signed)
Patient in extreme pain. Medicated with prn pain meds and per patient is not relieving her pain in her right leg. Patient is found crying and shaking in pain. On call MD notified. One time dose of IV morphine '4mg'$  was ordered and given to patient. Instant relief after morphine was given. Patient now able to relax some and is resting. Will monitor patient.

## 2015-06-14 NOTE — Consult Note (Signed)
Reason for Consult:  Impending pathologic fracture right femur Referring Physician:   Dr. Julieanne Manson  [name] is an 60 y.o. female.  HPI:   60 yo female with a history of metastatic colon cancer.  Developed severe right thigh pain and plain films and CT scan were obtained showing a large lytic lesion in the right femur shaft.  Ortho is consulted for further evaluation and treatment.  She does report significant right thigh pain.  Past Medical History  Diagnosis Date  . Hypertension     pt states she stopped taking meds while in her 20's  . Blood in stool   . History of blood transfusion   . Anemia   . Eczema   . Heart murmur     does not see a cardiologist has been told she does has a murmur and also that she does not have a murmur  . Anxiety   . Neuromuscular disorder (HCC)     numbness feet  . Hx of radiation therapy 10/06/13,16th,19th,22nd,&10/18/13    SBRT lung  . Cancer ([place])   . Metastatic colon cancer to liver [facility])     Past Surgical History  Procedure Laterality Date  . Breast lumpectomy      left breast  . Colonoscopy  02/18/2012    Procedure: COLONOSCOPY;  Surgeon: Beryle Beams, MD;  Location: [place];  Service: Endoscopy;  Laterality: N/A;  . Portacath placement  03/14/2012    Procedure: INSERTION PORT-A-CATH;  Surgeon: Ralene Ok, MD;  Location: [place];  Service: General;  Laterality: N/A;  . Laparoscopic sigmoid colectomy  04/11/2012    Procedure: LAPAROSCOPIC SIGMOID COLECTOMY;  Surgeon: Stark Klein, MD;  Location: [place];  Service: General;;  converted to Open with splenic flexure take down  . Open partial hepatectomy [83]  04/11/2012    Procedure: OPEN PARTIAL HEPATECTOMY [83];  Surgeon: Stark Klein, MD;  Location: [place];  Service: General;  Laterality: Right;  . Laparoscopy  04/11/2012    Procedure: LAPAROSCOPY DIAGNOSTIC;  Surgeon: Stark Klein, MD;  Location: [place];  Service: General;  Laterality: N/A;  . Cholecystectomy  04/11/2012     Procedure: CHOLECYSTECTOMY;  Surgeon: Stark Klein, MD;  Location: [place];  Service: General;;  . Ileocecetomy  04/11/2012    Procedure: Pennie Rushing;  Surgeon: Stark Klein, MD;  Location: [place];  Service: General;  Laterality: N/A;  . Port a cath revision  05/23/2012    Procedure: PORT A CATH REVISION;  Surgeon: Ralene Ok, MD;  Location: [place];  Service: General;  Laterality: Right;  Port-A-Cath repositioning versus replacement  . Abdominal hysterectomy      early 58's    Family History  Problem Relation Age of Onset  . Hypertension Mother   . Cancer Mother 44    breast ca  . Heart disease Father     Social History:  reports that she has never smoked. She has never used smokeless tobacco. She reports that she does not drink alcohol or use illicit drugs.  Allergies: No Known Allergies  Medications: I have reviewed the patient's current medications.  Results for orders placed or performed during the hospital encounter of 06/13/15 (from the past 48 hour(s))  Comprehensive metabolic panel     Status: Abnormal   Collection Time: 06/14/15  3:00 AM  Result Value Ref Range   Sodium 139 135 - 145 mmol/L   Potassium 3.9 3.5 - 5.1 mmol/L   Chloride 101 101 - 111 mmol/L   CO2  27 22 - 32 mmol/L   Glucose, Bld 117 (H) 65 - 99 mg/dL   BUN 16 6 - 20 mg/dL   Creatinine, Ser 0.99 0.44 - 1.00 mg/dL   Calcium 9.8 8.9 - 10.3 mg/dL   Total Protein 7.9 6.5 - 8.1 g/dL   Albumin 4.0 3.5 - 5.0 g/dL   AST 38 15 - 41 U/L   ALT 28 14 - 54 U/L   Alkaline Phosphatase 142 (H) 38 - 126 U/L   Total Bilirubin 1.0 0.3 - 1.2 mg/dL   GFR calc non Af Amer >60 >60 mL/min   GFR calc Af Amer >60 >60 mL/min    Comment: (NOTE) The eGFR has been calculated using the CKD EPI equation. This calculation has not been validated in all clinical situations. eGFR's persistently <60 mL/min signify possible Chronic Kidney Disease.    Anion gap 11 5 - 15    Ct Femur Right Wo Contrast  06/12/2015  CLINICAL DATA:   60 year old female with right thigh and right leg pain. No injury. EXAM: CT OF THE RIGHT FEMUR WITHOUT CONTRAST TECHNIQUE: Multidetector CT imaging was performed according to the standard protocol. Multiplanar CT image reconstructions were also generated. COMPARISON:  Radiograph dated 05/22/2015 FINDINGS: There is a partially visualized 4.3 x 2.3 cm lucent lesion in the inferior aspect of the right iliac bone superior to the right acetabulum. There is associated destruction of the cortex of the bone suggestive of aggressive nature of the lesion. A second lesion is noted in the midportion of the right femur measuring approximately 1.7 x 6.6 cm. There is partial erosive changes of the cortex of the bone adjacent to this lesion. Findings are concerning for metastatic disease. Other etiologies are not excluded. Further evaluation with bone scan or PET-CT is recommended to evaluate for possible additional lesions. There is no acute fracture or dislocation. The soft tissues appear unremarkable. IMPRESSION: Lytic lesion involving the inferior aspect of the right iliac bone as well as a lesion involving the midportion of the right femur most concerning for metastatic disease. Correlation with clinical exam and further evaluation with bone scan or PET-CT recommended. No acute fracture or dislocation. Electronically Signed   By: Anner Crete M.D.   On: 06/12/2015 21:30   Dg Knee Complete 4 Views Right  06/12/2015  CLINICAL DATA:  60 year old female with increasing right femoral pain and a history of metastatic colon cancer. EXAM: RIGHT KNEE - COMPLETE 4+ VIEW COMPARISON:  Concurrently obtained radiographs of the right femur; Prior radiographs of the pelvis and right hip 05/22/2015 FINDINGS: There is no evidence of fracture, dislocation, or joint effusion. There is no evidence of arthropathy or other focal bone abnormality. Soft tissues are unremarkable. IMPRESSION: Negative. Electronically Signed   By: Jacqulynn Cadet M.D.   On: 06/12/2015 20:18   Dg Femur, Min 2 Views Right  06/12/2015  CLINICAL DATA:  Increasing right femur pain x 1 month with great intensity x 2 days with out injury - metastatic colon cancer - pain concentrated proximal femur radiating to right knee EXAM: RIGHT FEMUR 2 VIEWS COMPARISON:  None. FINDINGS: No fracture. Knee joint normally aligned. Hip joint incompletely imaged. There is a subtle area of irregular widening of the medullary cavity of the mid femur without a discrete lesion. Mild resorption of the end ostium is suggested. Soft tissues are unremarkable. IMPRESSION: 1. No fracture. 2. Questionable bone lesion in the mid femur. This could be further assessed with CT or MRI or bone scan.  Electronically Signed   By: Lajean Manes M.D.   On: 06/12/2015 20:18    ROS Blood pressure 109/65, pulse 92, temperature 98.3 F (36.8 C), temperature source Oral, resp. rate 20, SpO2 97 %. Physical Exam  Constitutional: She is oriented to person, place, and time. She appears well-developed and well-nourished.  HENT:  Head: Normocephalic and atraumatic.  Eyes: EOM are normal.  Neck: Normal range of motion.  Cardiovascular: Normal rate and regular rhythm.   Respiratory: Effort normal and breath sounds normal.  GI: Soft. Bowel sounds are normal.  Musculoskeletal:       Right upper leg: She exhibits bony tenderness.  Neurological: She is alert and oriented to person, place, and time.  Skin: Skin is warm and dry.    Assessment/Plan: Impending pathologic fracture right femur due to metastatic lesion 1)  Due to the size of the lesion and her pain, we are recommending placing an intramedullary nail/rod down her femur to stabilize the bone and to allow for full weight bearing.  I spoke to her in length about this and plan on proceeding to surgery tomorrow.  She understands this fully.  I have spoken with Dr. Benay Spice about this and will take her on my service.  Mcarthur Rossetti 06/14/2015, 9:34 AM

## 2015-06-14 NOTE — Progress Notes (Signed)
IP PROGRESS NOTE  Subjective:   [name] continues to have pain in the right leg. The pain is relieved with IV morphine. No other complaint. She reports taking calcium prior to the blood draw at the [facility] yesterday.  Objective: Vital signs in last 24 hours: Blood pressure 109/65, pulse 92, temperature 98.3 F (36.8 C), temperature source Oral, resp. rate 20, SpO2 97 %.  Intake/Output from previous day: 02/16 0701 - 02/17 0700 In: 480 [P.O.:480] Out: -   Physical Exam:  Lungs: Clear bilaterally Cardiac: Regular rate and rhythm Abdomen: Nontender, no hepatomegaly Extremities: No leg edema Musculoskeletal: No tenderness at the right iliac or right thigh  Portacath/PICC-without erythema  Lab Results:  Recent Labs  06/13/15 1104  WBC 7.4  HGB 11.9  HCT 36.6  PLT 202    BMET  Recent Labs  06/13/15 1104 06/14/15 0300  NA 139 139  K 4.0 3.9  CL  --  101  CO2 26 27  GLUCOSE 110 117*  BUN 12.7 16  CREATININE 1.1 0.99  CALCIUM 11.0* 9.8    Studies/Results: Ct Femur Right Wo Contrast  06/12/2015  CLINICAL DATA:  60 year old female with right thigh and right leg pain. No injury. EXAM: CT OF THE RIGHT FEMUR WITHOUT CONTRAST TECHNIQUE: Multidetector CT imaging was performed according to the standard protocol. Multiplanar CT image reconstructions were also generated. COMPARISON:  Radiograph dated 05/22/2015 FINDINGS: There is a partially visualized 4.3 x 2.3 cm lucent lesion in the inferior aspect of the right iliac bone superior to the right acetabulum. There is associated destruction of the cortex of the bone suggestive of aggressive nature of the lesion. A second lesion is noted in the midportion of the right femur measuring approximately 1.7 x 6.6 cm. There is partial erosive changes of the cortex of the bone adjacent to this lesion. Findings are concerning for metastatic disease. Other etiologies are not excluded. Further evaluation with bone scan or PET-CT is  recommended to evaluate for possible additional lesions. There is no acute fracture or dislocation. The soft tissues appear unremarkable. IMPRESSION: Lytic lesion involving the inferior aspect of the right iliac bone as well as a lesion involving the midportion of the right femur most concerning for metastatic disease. Correlation with clinical exam and further evaluation with bone scan or PET-CT recommended. No acute fracture or dislocation. Electronically Signed   By: Anner Crete M.D.   On: 06/12/2015 21:30   Dg Knee Complete 4 Views Right  06/12/2015  CLINICAL DATA:  60 year old female with increasing right femoral pain and a history of metastatic colon cancer. EXAM: RIGHT KNEE - COMPLETE 4+ VIEW COMPARISON:  Concurrently obtained radiographs of the right femur; Prior radiographs of the pelvis and right hip 05/22/2015 FINDINGS: There is no evidence of fracture, dislocation, or joint effusion. There is no evidence of arthropathy or other focal bone abnormality. Soft tissues are unremarkable. IMPRESSION: Negative. Electronically Signed   By: Jacqulynn Cadet M.D.   On: 06/12/2015 20:18   Dg Femur, Min 2 Views Right  06/12/2015  CLINICAL DATA:  Increasing right femur pain x 1 month with great intensity x 2 days with out injury - metastatic colon cancer - pain concentrated proximal femur radiating to right knee EXAM: RIGHT FEMUR 2 VIEWS COMPARISON:  None. FINDINGS: No fracture. Knee joint normally aligned. Hip joint incompletely imaged. There is a subtle area of irregular widening of the medullary cavity of the mid femur without a discrete lesion. Mild resorption of the end ostium  is suggested. Soft tissues are unremarkable. IMPRESSION: 1. No fracture. 2. Questionable bone lesion in the mid femur. This could be further assessed with CT or MRI or bone scan. Electronically Signed   By: Lajean Manes M.D.   On: 06/12/2015 20:18    Medications: I have reviewed the patient's current  medications.  Assessment/Plan:  1. Metastatic colon cancer, most recently treated with FOLFOX/Avastin on 05/22/2015  Restaging CT scans 06/10/2015 with decreased pulmonary metastases, mild increase in the size of a right iliac lesion, stable T6 metastasis 2. 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 Hypertension 4. Presentation to the emergency room with right leg pain 06/12/2015-plain x-ray and CT confirmed a lucent lesion in the midportion of the right femur with erosive changes of the cortex, admitted for further evaluation and management 5. Mildly elevated calcium level 06/13/2015-likely related to taking a calcium supplement  Ms. Burr appears stable. She will continue morphine and oxycodone for pain. Dr. Ninfa Linden will evaluate her to consider surgical fixation of the right femur. We will refer her to radiation oncology after surgery.     LOS: 1 day   Betsy Coder, MD   06/14/2015, 9:38 AM

## 2015-06-14 NOTE — Progress Notes (Signed)
Nutrition Brief Note  Patient identified on the Malnutrition Screening Tool (MST) Report  Wt Readings from Last 15 Encounters:  06/13/15 258 lb 12.8 oz (117.391 kg)  05/22/15 263 lb 9.6 oz (119.568 kg)  05/08/15 266 lb (120.657 kg)  05/08/15 267 lb 12.8 oz (121.473 kg)  05/01/15 265 lb 9.6 oz (120.475 kg)  04/23/15 266 lb 11.2 oz (120.974 kg)  04/08/15 266 lb 6.4 oz (120.838 kg)  03/19/15 269 lb 9.6 oz (122.29 kg)  02/21/15 267 lb 4.8 oz (121.246 kg)  01/17/15 266 lb 11.2 oz (120.974 kg)  12/26/14 268 lb 14.4 oz (121.972 kg)  12/14/14 269 lb 8 oz (122.244 kg)  11/28/14 269 lb 12.8 oz (122.38 kg)  11/28/14 269 lb (122.018 kg)  10/31/14 269 lb 6.4 oz (122.199 kg)    There is no weight on file to calculate BMI. Patient meets criteria for obese class II based on current BMI.   Current diet order is regular, patient is consuming approximately 100% of meals at this time. Labs and medications reviewed.   No nutrition interventions warranted at this time. If nutrition issues arise, please consult RD.   Satira Anis. Zelma Mazariego, MS, RD LDN After Hours/Weekend Pager [phone]

## 2015-06-15 ENCOUNTER — Inpatient Hospital Stay (HOSPITAL_COMMUNITY): Payer: Medicare Other | Admitting: Anesthesiology

## 2015-06-15 ENCOUNTER — Encounter (HOSPITAL_COMMUNITY)
Admission: AD | Disposition: A | Discharge status: Home Health | Payer: Self-pay | Source: Ambulatory Visit | Attending: Orthopaedic Surgery

## 2015-06-15 ENCOUNTER — Encounter (HOSPITAL_COMMUNITY): Payer: Self-pay | Admitting: Anesthesiology

## 2015-06-15 ENCOUNTER — Inpatient Hospital Stay (HOSPITAL_COMMUNITY): Payer: Medicare Other

## 2015-06-15 DIAGNOSIS — C189 Malignant neoplasm of colon, unspecified: Secondary | ICD-10-CM

## 2015-06-15 DIAGNOSIS — Z7901 Long term (current) use of anticoagulants: Secondary | ICD-10-CM

## 2015-06-15 DIAGNOSIS — Z86718 Personal history of other venous thrombosis and embolism: Secondary | ICD-10-CM

## 2015-06-15 DIAGNOSIS — C7800 Secondary malignant neoplasm of unspecified lung: Secondary | ICD-10-CM | POA: Insufficient documentation

## 2015-06-15 HISTORY — PX: FEMUR IM NAIL: SHX1597

## 2015-06-15 LAB — ABO/RH: ABO/RH(D): A POS

## 2015-06-15 LAB — SURGICAL PCR SCREEN
MRSA, PCR: NEGATIVE
STAPHYLOCOCCUS AUREUS: NEGATIVE

## 2015-06-15 SURGERY — INSERTION, INTRAMEDULLARY ROD, FEMUR
Anesthesia: General | Site: Leg Upper | Laterality: Right

## 2015-06-15 MED ORDER — ONDANSETRON HCL 4 MG/2ML IJ SOLN
INTRAMUSCULAR | Status: AC
  Filled 2015-06-15: qty 2

## 2015-06-15 MED ORDER — DEXAMETHASONE SODIUM PHOSPHATE 10 MG/ML IJ SOLN
INTRAMUSCULAR | Status: AC
Start: 2015-06-15 — End: 2015-06-15
  Filled 2015-06-15: qty 1

## 2015-06-15 MED ORDER — ROCURONIUM BROMIDE 100 MG/10ML IV SOLN
INTRAVENOUS | Status: AC
  Filled 2015-06-15: qty 1

## 2015-06-15 MED ORDER — FENTANYL CITRATE (PF) 250 MCG/5ML IJ SOLN
INTRAMUSCULAR | Status: AC
  Filled 2015-06-15: qty 5

## 2015-06-15 MED ORDER — CEFAZOLIN SODIUM-DEXTROSE 2-3 GM-% IV SOLR
INTRAVENOUS | Status: AC
  Filled 2015-06-15: qty 50

## 2015-06-15 MED ORDER — LACTATED RINGERS IV SOLN: INTRAVENOUS | Status: DC

## 2015-06-15 MED ORDER — PHENYLEPHRINE 40 MCG/ML (10ML) SYRINGE FOR IV PUSH (FOR BLOOD PRESSURE SUPPORT)
PREFILLED_SYRINGE | INTRAVENOUS | Status: AC
  Filled 2015-06-15: qty 10

## 2015-06-15 MED ORDER — HYDROMORPHONE HCL 1 MG/ML IJ SOLN
INTRAMUSCULAR | Status: AC
  Filled 2015-06-15: qty 1

## 2015-06-15 MED ORDER — PROPOFOL 10 MG/ML IV BOLUS
INTRAVENOUS | Status: AC
  Filled 2015-06-15: qty 40

## 2015-06-15 MED ORDER — ROCURONIUM BROMIDE 100 MG/10ML IV SOLN
INTRAVENOUS | Status: DC | PRN
  Administered 2015-06-15: 50 mg via INTRAVENOUS

## 2015-06-15 MED ORDER — MIDAZOLAM HCL 5 MG/5ML IJ SOLN
INTRAMUSCULAR | Status: DC | PRN
  Administered 2015-06-15: 2 mg via INTRAVENOUS

## 2015-06-15 MED ORDER — FENTANYL CITRATE (PF) 100 MCG/2ML IJ SOLN
INTRAMUSCULAR | Status: DC | PRN
  Administered 2015-06-15: 50 ug via INTRAVENOUS
  Administered 2015-06-15: 150 ug via INTRAVENOUS
  Administered 2015-06-15: 50 ug via INTRAVENOUS

## 2015-06-15 MED ORDER — CEFAZOLIN SODIUM-DEXTROSE 2-3 GM-% IV SOLR
2.0000 g | Freq: Three times a day (TID) | INTRAVENOUS | Status: AC
  Administered 2015-06-15 – 2015-06-16 (×3): 2 g via INTRAVENOUS
  Filled 2015-06-15 (×3): qty 50

## 2015-06-15 MED ORDER — MIDAZOLAM HCL 2 MG/2ML IJ SOLN
INTRAMUSCULAR | Status: AC
  Filled 2015-06-15: qty 2

## 2015-06-15 MED ORDER — SODIUM CHLORIDE 0.9 % IJ SOLN
INTRAMUSCULAR | Status: AC
  Filled 2015-06-15: qty 10

## 2015-06-15 MED ORDER — HYDROMORPHONE HCL 1 MG/ML IJ SOLN
0.2500 mg | INTRAMUSCULAR | Status: DC | PRN
  Administered 2015-06-15 (×4): 0.5 mg via INTRAVENOUS

## 2015-06-15 MED ORDER — SUGAMMADEX SODIUM 200 MG/2ML IV SOLN
INTRAVENOUS | Status: AC
  Filled 2015-06-15: qty 2

## 2015-06-15 MED ORDER — ONDANSETRON HCL 4 MG/2ML IJ SOLN
INTRAMUSCULAR | Status: DC | PRN
  Administered 2015-06-15: 4 mg via INTRAVENOUS

## 2015-06-15 MED ORDER — PROMETHAZINE HCL 25 MG/ML IJ SOLN
6.2500 mg | INTRAMUSCULAR | Status: DC | PRN
Start: 2015-06-15 — End: 2015-06-15

## 2015-06-15 MED ORDER — LIDOCAINE HCL (CARDIAC) 20 MG/ML IV SOLN
INTRAVENOUS | Status: DC | PRN
  Administered 2015-06-15: 50 mg via INTRAVENOUS

## 2015-06-15 MED ORDER — HYDROMORPHONE HCL 1 MG/ML IJ SOLN: 1.0000 mg | INTRAMUSCULAR | Status: DC | PRN

## 2015-06-15 MED ORDER — DEXAMETHASONE SODIUM PHOSPHATE 10 MG/ML IJ SOLN
INTRAMUSCULAR | Status: DC | PRN
Start: 2015-06-15 — End: 2015-06-15
  Administered 2015-06-15: 10 mg via INTRAVENOUS

## 2015-06-15 MED ORDER — SUGAMMADEX SODIUM 200 MG/2ML IV SOLN
INTRAVENOUS | Status: DC | PRN
  Administered 2015-06-15: 200 mg via INTRAVENOUS

## 2015-06-15 MED ORDER — LIDOCAINE HCL (CARDIAC) 20 MG/ML IV SOLN
INTRAVENOUS | Status: AC
  Filled 2015-06-15: qty 5

## 2015-06-15 MED ORDER — MIDAZOLAM HCL 2 MG/2ML IJ SOLN: 0.5000 mg | Freq: Once | INTRAMUSCULAR | Status: DC | PRN

## 2015-06-15 MED ORDER — EPHEDRINE SULFATE 50 MG/ML IJ SOLN
INTRAMUSCULAR | Status: AC
  Filled 2015-06-15: qty 1

## 2015-06-15 MED ORDER — SODIUM CHLORIDE 0.9 % IV SOLN: INTRAVENOUS | Status: DC

## 2015-06-15 MED ORDER — METOCLOPRAMIDE HCL 5 MG/ML IJ SOLN
INTRAMUSCULAR | Status: DC | PRN
  Administered 2015-06-15: 10 mg via INTRAVENOUS

## 2015-06-15 MED ORDER — METOCLOPRAMIDE HCL 5 MG/ML IJ SOLN
INTRAMUSCULAR | Status: AC
  Filled 2015-06-15: qty 2

## 2015-06-15 MED ORDER — RIVAROXABAN 20 MG PO TABS
20.0000 mg | ORAL_TABLET | Freq: Every day | ORAL | Status: DC
  Administered 2015-06-16 – 2015-06-18 (×3): 20 mg via ORAL
  Filled 2015-06-15 (×3): qty 1

## 2015-06-15 MED ORDER — CEFAZOLIN SODIUM-DEXTROSE 2-3 GM-% IV SOLR
INTRAVENOUS | Status: DC | PRN
  Administered 2015-06-15: 2 g via INTRAVENOUS

## 2015-06-15 MED ORDER — MEPERIDINE HCL 50 MG/ML IJ SOLN: 6.2500 mg | INTRAMUSCULAR | Status: DC | PRN

## 2015-06-15 MED ORDER — PHENYLEPHRINE HCL 10 MG/ML IJ SOLN
INTRAMUSCULAR | Status: DC | PRN
  Administered 2015-06-15: 80 ug via INTRAVENOUS

## 2015-06-15 MED ORDER — LACTATED RINGERS IV SOLN
INTRAVENOUS | Status: DC | PRN
  Administered 2015-06-15 (×2): via INTRAVENOUS

## 2015-06-15 MED ORDER — NEOSTIGMINE METHYLSULFATE 10 MG/10ML IV SOLN
INTRAVENOUS | Status: AC
  Filled 2015-06-15: qty 1

## 2015-06-15 MED ORDER — PROPOFOL 10 MG/ML IV BOLUS
INTRAVENOUS | Status: DC | PRN
  Administered 2015-06-15: 150 mg via INTRAVENOUS

## 2015-06-15 MED ORDER — 0.9 % SODIUM CHLORIDE (POUR BTL) OPTIME
TOPICAL | Status: DC | PRN
  Administered 2015-06-15: 1000 mL

## 2015-06-15 MED ORDER — GLYCOPYRROLATE 0.2 MG/ML IJ SOLN
INTRAMUSCULAR | Status: AC
  Filled 2015-06-15: qty 1

## 2015-06-15 SURGICAL SUPPLY — 51 items
BAG SPEC THK2 15X12 ZIP CLS (MISCELLANEOUS) ×1
BAG ZIPLOCK 12X15 (MISCELLANEOUS) ×3 IMPLANT
BIT DRILL LONG 4.0MM (BIT) IMPLANT
BIT DRILL SHORT 4.0 (BIT) IMPLANT
BNDG GAUZE ELAST 4 BULKY (GAUZE/BANDAGES/DRESSINGS) ×3 IMPLANT
COVER PERINEAL POST (MISCELLANEOUS) ×3 IMPLANT
DRAPE STERI IOBAN 125X83 (DRAPES) ×3 IMPLANT
DRILL BIT LONG 4.0MM (BIT) ×6
DRILL BIT SHORT 4.0 (BIT) ×3
DRILL STEP 6.4MM ×2 IMPLANT
DRSG MEPILEX BORDER 4X4 (GAUZE/BANDAGES/DRESSINGS) ×2 IMPLANT
DRSG MEPILEX BORDER 4X8 (GAUZE/BANDAGES/DRESSINGS) ×4 IMPLANT
DRSG PAD ABDOMINAL 8X10 ST (GAUZE/BANDAGES/DRESSINGS) ×3 IMPLANT
DRSG TEGADERM 4X4.75 (GAUZE/BANDAGES/DRESSINGS) IMPLANT
DURAPREP 26ML APPLICATOR (WOUND CARE) ×3 IMPLANT
ELECT REM PT RETURN 9FT ADLT (ELECTROSURGICAL) ×3
ELECTRODE REM PT RTRN 9FT ADLT (ELECTROSURGICAL) ×1 IMPLANT
FACESHIELD WRAPAROUND (MASK) IMPLANT
FACESHIELD WRAPAROUND OR TEAM (MASK) IMPLANT
GAUZE SPONGE 4X4 12PLY STRL (GAUZE/BANDAGES/DRESSINGS) ×3 IMPLANT
GAUZE XEROFORM 5X9 LF (GAUZE/BANDAGES/DRESSINGS) ×3 IMPLANT
GLOVE BIO SURGEON STRL SZ7.5 (GLOVE) ×3 IMPLANT
GLOVE BIOGEL PI IND STRL 8 (GLOVE) ×1 IMPLANT
GLOVE BIOGEL PI INDICATOR 8 (GLOVE) ×2
GLOVE ECLIPSE 8.0 STRL XLNG CF (GLOVE) ×3 IMPLANT
GOWN STRL REUS W/TWL XL LVL3 (GOWN DISPOSABLE) ×3 IMPLANT
GUIDE PIN 3.2MM (MISCELLANEOUS) ×3
GUIDE PIN ORTH 343X3.2XBRAD (MISCELLANEOUS) IMPLANT
GUIDE ROD 3.0 (MISCELLANEOUS) ×3
NAIL FEMORAL 10X40MM (Nail) ×2 IMPLANT
NS IRRIG 1000ML POUR BTL (IV SOLUTION) ×3 IMPLANT
PACK GENERAL/GYN (CUSTOM PROCEDURE TRAY) ×3 IMPLANT
PAD CAST 4YDX4 CTTN HI CHSV (CAST SUPPLIES) ×1 IMPLANT
PADDING CAST COTTON 4X4 STRL (CAST SUPPLIES) ×3
POSITIONER SURGICAL ARM (MISCELLANEOUS) ×3 IMPLANT
ROD GUIDE 3.0 (MISCELLANEOUS) IMPLANT
SCREW 6.4X85 (Screw) ×4 IMPLANT
SCREW TRIGEN LOW PROF 5.0X37.5 (Screw) ×2 IMPLANT
SCREW TRIGEN LOW PROF 5.0X40 (Screw) ×2 IMPLANT
SPONGE LAP 18X18 X RAY DECT (DISPOSABLE) IMPLANT
STAPLER VISISTAT 35W (STAPLE) ×3 IMPLANT
SUT VIC AB 0 CT1 27 (SUTURE) ×3
SUT VIC AB 0 CT1 27XBRD ANTBC (SUTURE) ×1 IMPLANT
SUT VIC AB 1 CT1 27 (SUTURE) ×6
SUT VIC AB 1 CT1 27XBRD ANTBC (SUTURE) ×2 IMPLANT
SUT VIC AB 2-0 CT1 27 (SUTURE) ×3
SUT VIC AB 2-0 CT1 TAPERPNT 27 (SUTURE) ×1 IMPLANT
TOWEL OR 17X26 10 PK STRL BLUE (TOWEL DISPOSABLE) ×6 IMPLANT
TRAY FOLEY W/METER SILVER 14FR (SET/KITS/TRAYS/PACK) IMPLANT
TRAY FOLEY W/METER SILVER 16FR (SET/KITS/TRAYS/PACK) IMPLANT
WATER STERILE IRR 1500ML POUR (IV SOLUTION) ×3 IMPLANT

## 2015-06-15 NOTE — Brief Op Note (Signed)
06/13/2015 - 06/15/2015  9:19 AM  PATIENT:  [name]  60 y.o. female  PRE-OPERATIVE DIAGNOSIS:  impending pathologic fracture right femur  POST-OPERATIVE DIAGNOSIS:  impending pathologic fracture right femur  PROCEDURE:  Procedure(s): INTRAMEDULLARY (IM) NAIL INTERTROCHANTERIC  (Right)  SURGEON:  Surgeon(s) and Role:    * Mcarthur Rossetti, MD - Primary  ASSISTANTS: OR staff   ANESTHESIA:   general  EBL:  Total I/O In: 1600 [I.V.:1600] Out: 400 [Blood:400]  COUNTS:  YES  DICTATION: .Other Dictation: Dictation Number [id number]  PLAN OF CARE: Admit to inpatient   PATIENT DISPOSITION:  PACU - hemodynamically stable.   Delay start of Pharmacological VTE agent (>24hrs) due to surgical blood loss or risk of bleeding: no

## 2015-06-15 NOTE — Anesthesia Preprocedure Evaluation (Addendum)
Anesthesia Evaluation  Patient identified by MRN, date of birth, ID band Patient awake    Reviewed: Allergy & Precautions, NPO status , Patient's Chart, lab work & pertinent test results  History of Anesthesia Complications Negative for: history of anesthetic complications  Airway Mallampati: II  TM Distance: >3 FB Neck ROM: Full    Dental  (+) Poor Dentition, Chipped, Missing, Dental Advisory Given   Pulmonary  Lung mets from colon cancer: XRT   breath sounds clear to auscultation       Cardiovascular hypertension, Pt. 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)   Rhythm:Regular Rate:Normal     Neuro/Psych negative neurological ROS     GI/Hepatic Mets to liver Colon cancer with mets to liver: surgery, chemo   Endo/Other  Morbid obesity  Renal/GU negative Renal ROS     Musculoskeletal  (+) Arthritis , Osteoarthritis,    Abdominal (+) + obese,   Peds  Hematology  (+) Blood dyscrasia (xarelto), ,   Anesthesia Other Findings   Reproductive/Obstetrics                           Anesthesia Physical Anesthesia Plan  ASA: III  Anesthesia Plan: General   Post-op Pain Management:    Induction: Intravenous  Airway Management Planned: Oral ETT  Additional Equipment:   Intra-op Plan:   Post-operative Plan: Extubation in OR  Informed Consent: I have reviewed the patients History and Physical, chart, labs and discussed the procedure including the risks, benefits and alternatives for the proposed anesthesia with the patient or authorized representative who has indicated his/her understanding and acceptance.   Dental advisory given  Plan Discussed with: Surgeon and CRNA  Anesthesia Plan Comments: (Plan routine monitors, GETA)        Anesthesia Quick Evaluation

## 2015-06-15 NOTE — Transfer of Care (Signed)
Immediate Anesthesia Transfer of Care Note  Patient: [name]  Procedure(s) Performed: Procedure(s): INTRAMEDULLARY (IM) NAIL INTERTROCHANTERIC  (Right)  Patient Location: PACU  Anesthesia Type:General  Level of Consciousness:  sedated, patient cooperative and responds to stimulation  Airway & Oxygen Therapy:Patient Spontanous Breathing and Patient connected to face mask oxgen  Post-op Assessment:  Report given to PACU RN and Post -op Vital signs reviewed and stable  Post vital signs:  Reviewed and stable  Last Vitals:  Filed Vitals:   06/14/15 2125 06/15/15 0540  BP: 144/70 156/87  Pulse: 97 95  Temp: 37.1 C 37.1 C  Resp: 20 20    Complications: No apparent anesthesia complications

## 2015-06-15 NOTE — Progress Notes (Signed)
[name]   DOB:Jul 15, 1955   QX#:[id number]   MKL#:[id number]  Subjective: [name] has just returned to her room from surgery. She is sleepy but alert and tells me she has no pain at present. She is able to cough. No family in room   Objective: middle aged African American woman examined in bed  Filed Vitals:   06/15/15 1025 06/15/15 1035  BP:  139/75  Pulse:  96  Temp: 98.5 F (36.9 C) 97.9 F (36.6 C)  Resp: 12     There is no weight on file to calculate BMI.  Intake/Output Summary (Last 24 hours) at 06/15/15 1048 Last data filed at 06/15/15 1025  Gross per 24 hour  Intake   2340 ml  Output    400 ml  Net   1940 ml     Sclerae unicteric  No cervical or supraclavicular adenopathy  Lungs no rales or rhonchi--auscultated anterolaterally  Heart regular rate and rhythm  Abdomen soft  MSK ns/p R nail placement--wound not examined  Neuro nonfocal   CBG (last 3)  No results for input(s): GLUCAP in the last 72 hours.   Labs:  Lab Results  Component Value Date   WBC 7.4 06/13/2015   HGB 11.9 06/13/2015   HCT 36.6 06/13/2015   MCV 91.6 06/13/2015   PLT 202 06/13/2015   NEUTROABS 4.5 06/13/2015    '@LASTCHEMISTRY'$ @  Urine Studies No results for input(s): UHGB, CRYS in the last 72 hours.  Invalid input(s): UACOL, UAPR, USPG, UPH, UTP, UGL, UKET, UBIL, UNIT, UROB, ULEU, UEPI, UWBC, URBC, UBAC, CAST, UCOM, BILUA  Basic Metabolic Panel:  Recent Labs Lab 06/13/15 1104 06/14/15 0300  NA 139 139  K 4.0 3.9  CL  --  101  CO2 26 27  GLUCOSE 110 117*  BUN 12.7 16  CREATININE 1.1 0.99  CALCIUM 11.0* 9.8   GFR Estimated Creatinine Clearance: 85.3 mL/min (by C-G formula based on Cr of 0.99). Liver Function Tests:  Recent Labs Lab 06/13/15 1104 06/14/15 0300  AST 42* 38  ALT 33 28  ALKPHOS 187* 142*  BILITOT 0.80 1.0  PROT 8.9* 7.9  ALBUMIN 4.2 4.0   No results for input(s): LIPASE, AMYLASE in the last 168 hours. No results for input(s): AMMONIA in the last  168 hours. Coagulation profile No results for input(s): INR, PROTIME in the last 168 hours.  CBC:  Recent Labs Lab 06/13/15 1104  WBC 7.4  NEUTROABS 4.5  HGB 11.9  HCT 36.6  MCV 91.6  PLT 202   Cardiac Enzymes: No results for input(s): CKTOTAL, CKMB, CKMBINDEX, TROPONINI in the last 168 hours. BNP: Invalid input(s): POCBNP CBG: No results for input(s): GLUCAP in the last 168 hours. D-Dimer No results for input(s): DDIMER in the last 72 hours. Hgb A1c No results for input(s): HGBA1C in the last 72 hours. Lipid Profile No results for input(s): CHOL, HDL, LDLCALC, TRIG, CHOLHDL, LDLDIRECT in the last 72 hours. Thyroid function studies No results for input(s): TSH, T4TOTAL, T3FREE, THYROIDAB in the last 72 hours.  Invalid input(s): FREET3 Anemia work up No results for input(s): VITAMINB12, FOLATE, FERRITIN, TIBC, IRON, RETICCTPCT in the last 72 hours. Microbiology Recent Results (from the past 240 hour(s))  Surgical pcr screen     Status: None   Collection Time: 06/15/15  5:07 AM  Result Value Ref Range Status   MRSA, PCR NEGATIVE NEGATIVE Final   Staphylococcus aureus NEGATIVE NEGATIVE Final    Comment:  The Xpert SA Assay (FDA approved for NASAL specimens in patients over 47 years of age), is one component of a comprehensive surveillance program.  Test performance has been validated by [facility] for patients greater than or equal to 61 year old. It is not intended to diagnose infection nor to guide or monitor treatment.       Studies:  Dg C-arm 1-60 Min-no Report  06/15/2015  CLINICAL DATA: ORIF right hip C-ARM 1-60 MINUTES Fluoroscopy was utilized by the requesting physician.  No radiographic interpretation.   Dg Hip Operative Unilat With Pelvis Right  06/15/2015  CLINICAL DATA:  60 year old female undergoing fixation of the right femur EXAM: DG C-ARM 1-60 MIN-NO REPORT; OPERATIVE RIGHT HIP WITH PELVIS COMPARISON:  Prior hip radiographs 05/22/2015  FINDINGS: A total of 7 intraoperative spot radiographs demonstrates placement of an intra medullary nail with 2 distal interlocking screws and 2 proximal transfemoral neck cannulated lag screws. Irregular lytic lesion in the mid femoral diaphysis better seen on prior CT. No evidence of acute hardware complication. IMPRESSION: Placement of right femoral intra medullary nail with 2 distal interlocking screws and 2 transfemoral neck cannulated lag screws without evidence of acute complication. Irregular lytic lesion in the mid femoral diaphysis better seen on recent CT scan. Electronically Signed   By: Jacqulynn Cadet M.D.   On: 06/15/2015 09:21    Assessment: 60 y.o. [place] woman s/p intramedullary intertrochanteric nail placement 06/15/2015 for a R femoral lytic lesion with impending pathologic fracture, with history otherswise as follows  1. Metastatic colon cancer, most recently treated with FOLFOX/Avastin on 05/22/2015  Restaging CT scans 06/10/2015 with decreased pulmonary metastases, mild increase in the size of a right iliac lesion, stable T6 metastasis 2. 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 Hypertension 4. Presented to the emergency room with right leg pain 06/12/2015-plain x-ray and CT confirmed a lucent lesion in the midportion of the right femur with erosive changes of the cortex, at risk or fracture with weight-bearing 5. Mildly elevated calcium level 06/13/2015-likely related to taking a calcium supplement--resolved  Plan:  [name] appears to have tolerated the surgery well as is currently not in pain. She is at slightly increased beeding risk due to bevacizumab/Avastin but last dose was 05/22/2015. She is also at significant clotting risk and rivaroxaban has been appropriately ordered.  Dr Benay Spice will follow-up with the patient on Monday. Please let us know if we can be of further help over the weekend.   Chauncey Cruel, MD 06/15/2015  10:48 AM Medical Oncology and Hematology [facility] [address] [place],  [address] Tel. [phone]    Fax. [phone]

## 2015-06-15 NOTE — Anesthesia Postprocedure Evaluation (Signed)
Anesthesia Post Note  Patient: Antavia Tandy  Procedure(s) Performed: Procedure(s) (LRB): INTRAMEDULLARY (IM) NAIL INTERTROCHANTERIC  (Right)  Patient location during evaluation: PACU Anesthesia Type: General Level of consciousness: awake and alert, oriented and patient cooperative Pain management: pain level controlled Vital Signs Assessment: post-procedure vital signs reviewed and stable Respiratory status: spontaneous breathing, nonlabored ventilation, respiratory function stable and patient connected to nasal cannula oxygen Cardiovascular status: blood pressure returned to baseline and stable Postop Assessment: no signs of nausea or vomiting Anesthetic complications: no    Last Vitals:  Filed Vitals:   06/15/15 1240 06/15/15 1355  BP: 107/77 130/79  Pulse: 98 98  Temp: 36.4 C 36.7 C  Resp: 14 14    Last Pain:  Filed Vitals:   06/15/15 1456  PainSc: 0-No pain                 Juanpablo Ciresi,E. Elvina Bosch

## 2015-06-15 NOTE — Anesthesia Procedure Notes (Signed)
Procedure Name: Intubation Date/Time: 06/15/2015 7:35 AM Performed by: Carlin Mamone, Virgel Gess Pre-anesthesia Checklist: Patient identified, Emergency Drugs available, Suction available, Patient being monitored and Timeout performed Patient Re-evaluated:Patient Re-evaluated prior to inductionOxygen Delivery Method: Circle system utilized Preoxygenation: Pre-oxygenation with 100% oxygen Intubation Type: IV induction Ventilation: Mask ventilation without difficulty Laryngoscope Size: Mac and 4 Grade View: Grade II Tube type: Oral Tube size: 7.5 mm Number of attempts: 1 Airway Equipment and Method: Stylet Placement Confirmation: ETT inserted through vocal cords under direct vision,  positive ETCO2,  CO2 detector and breath sounds checked- equal and bilateral Secured at: 22 cm Tube secured with: Tape Dental Injury: Teeth and Oropharynx as per pre-operative assessment

## 2015-06-16 DIAGNOSIS — M899 Disorder of bone, unspecified: Secondary | ICD-10-CM

## 2015-06-16 LAB — CBC
HCT: 32.3 % — ABNORMAL LOW (ref 36.0–46.0)
HEMOGLOBIN: 7.7 g/dL — AB (ref 12.0–15.0)
MCH: 31.2 pg (ref 26.0–34.0)
MCHC: 23.8 g/dL — ABNORMAL LOW (ref 30.0–36.0)
MCV: 130.8 fL — AB (ref 78.0–100.0)
PLATELETS: 144 10*3/uL — AB (ref 150–400)
RBC: 2.47 MIL/uL — AB (ref 3.87–5.11)
RDW: 18.9 % — ABNORMAL HIGH (ref 11.5–15.5)
WBC: 10.7 10*3/uL — AB (ref 4.0–10.5)

## 2015-06-16 LAB — BASIC METABOLIC PANEL
ANION GAP: 7 (ref 5–15)
BUN: 24 mg/dL — AB (ref 6–20)
CALCIUM: 9 mg/dL (ref 8.9–10.3)
CHLORIDE: 103 mmol/L (ref 101–111)
CO2: 27 mmol/L (ref 22–32)
Creatinine, Ser: 1.01 mg/dL — ABNORMAL HIGH (ref 0.44–1.00)
GFR calc Af Amer: >60 mL/min (ref >60)
GFR, EST NON AFRICAN AMERICAN: 59 mL/min — AB (ref >60)
GLUCOSE: 146 mg/dL — AB (ref 65–99)
POTASSIUM: 5 mmol/L (ref 3.5–5.1)
SODIUM: 137 mmol/L (ref 135–145)

## 2015-06-16 LAB — PREPARE RBC (CROSSMATCH)

## 2015-06-16 MED ORDER — SODIUM CHLORIDE 0.9 % IV SOLN: Freq: Once | INTRAVENOUS | Status: DC

## 2015-06-16 MED ORDER — FUROSEMIDE 10 MG/ML IJ SOLN
20.0000 mg | Freq: Once | INTRAMUSCULAR | Status: AC
  Administered 2015-06-16: 20 mg via INTRAVENOUS
  Filled 2015-06-16: qty 2

## 2015-06-16 NOTE — Evaluation (Signed)
Physical Therapy Evaluation Patient Details Name: [name] MRN: [id number] DOB: 1956/03/08 Today's Date: 06/16/2015   History of Present Illness  Pt s/p IM nailing of impending pathologic fx R femur.  Pt with hx of colon CA with mets   Clinical Impression  Pt admitted as above and presenting with decreased R LE strength/ROM and post op pain limiting functional mobility.  Pt should progress to dc home with dtr and would benefit from follow up HHPT.  Pt could also benefit from OT intervention while in hospital.    Follow Up Recommendations Home health PT    Equipment Recommendations  Rolling walker with 5" wheels    Recommendations for Other Services OT consult     Precautions / Restrictions Precautions Precautions: Fall Restrictions Weight Bearing Restrictions: No RLE Weight Bearing: Weight bearing as tolerated      Mobility  Bed Mobility Overal bed mobility: Needs Assistance Bed Mobility: Supine to Sit     Supine to sit: Min assist     General bed mobility comments: cues for sequence and use of L LE to self assist  Transfers Overall transfer level: Needs assistance Equipment used: Rolling walker (2 wheeled) Transfers: Sit to/from Stand Sit to Stand: Min assist         General transfer comment: cues for LE management and use of UEs to self assist  Ambulation/Gait Ambulation/Gait assistance: Min assist Ambulation Distance (Feet): 26 Feet Assistive device: Rolling walker (2 wheeled) Gait Pattern/deviations: Step-to pattern;Decreased step length - right;Decreased step length - left;Shuffle;Trunk flexed Gait velocity: decr Gait velocity interpretation: Below normal speed for age/gender General Gait Details: cues for sequence, posture and position from RW.  Distance ltd by reports of fatigue and mild dizziness BP 141/74.  Hgb 7.7 this am  Stairs            Wheelchair Mobility    Modified Rankin (Stroke Patients Only)       Balance                                             Pertinent Vitals/Pain Pain Assessment: 0-10 Pain Score: 4  Pain Location: R hip Pain Descriptors / Indicators: Aching;Sore Pain Intervention(s): Limited activity within patient's tolerance;Monitored during session;Premedicated before session;Ice applied    Home Living Family/patient expects to be discharged to:: Private residence Living Arrangements: Children Available Help at Discharge: Family Type of Home: Apartment Home Access: Level entry     Home Layout: Two level Asher: Radio producer - single point Additional Comments: Home with dtr and grandchild    Prior Function Level of Independence: Independent               Hand Dominance        Extremity/Trunk Assessment   Upper Extremity Assessment: Overall WFL for tasks assessed           Lower Extremity Assessment: RLE deficits/detail RLE Deficits / Details: Strength at R hip 3-/5 with AAROM at hip to 85 flex and 30 abd    Cervical / Trunk Assessment: Normal  Communication   Communication: No difficulties  Cognition Arousal/Alertness: Awake/alert Behavior During Therapy: WFL for tasks assessed/performed Overall Cognitive Status: Within Functional Limits for tasks assessed                      General Comments      Exercises General Exercises -  Lower Extremity Ankle Circles/Pumps: AROM;Both;15 reps;Supine Quad Sets: AROM;Both;10 reps;Supine Heel Slides: AAROM;Right;20 reps;Supine Hip ABduction/ADduction: AAROM;Right;15 reps;Supine      Assessment/Plan    PT Assessment Patient needs continued PT services  PT Diagnosis Difficulty walking   PT Problem List Decreased strength;Decreased range of motion;Decreased activity tolerance;Decreased mobility;Decreased knowledge of use of DME;Pain;Obesity  PT Treatment Interventions DME instruction;Gait training;Stair training;Functional mobility training;Therapeutic activities;Therapeutic  exercise;Patient/family education   PT Goals (Current goals can be found in the Care Plan section) Acute Rehab PT Goals Patient Stated Goal: Regain IND PT Goal Formulation: With patient Time For Goal Achievement: 06/22/15 Potential to Achieve Goals: Good    Frequency 7X/week   Barriers to discharge        Co-evaluation               End of Session Equipment Utilized During Treatment: Gait belt Activity Tolerance: Patient tolerated treatment well Patient left: in chair;with call bell/phone within reach Nurse Communication: Mobility status         Time: 0865-7846 PT Time Calculation (min) (ACUTE ONLY): 41 min   Charges:   PT Evaluation $PT Eval Low Complexity: 1 Procedure PT Treatments $Gait Training: 8-22 mins $Therapeutic Exercise: 8-22 mins   PT G Codes:        Kaycie Pegues 06-18-15, 12:30 PM

## 2015-06-16 NOTE — Progress Notes (Signed)
[name]   DOB:[birth year]   KC#:[id number]   VCB#:[id number]   COURTESY VISIT:  Subjective: [name] c/o significant pain, but tells me the medications she is receiving are working-- "they just make me groggy." Not getting OOB yet. No family in room  Objective: middle aged [place] American woman .  Filed Vitals:   06/16/15 0210 06/16/15 0548  BP: 142/74 143/86  Pulse: 88 87  Temp: 98.1 F (36.7 C) 97.9 F (36.6 C)  Resp: 15 16    Body mass index is 36 kg/(m^2).  Intake/Output Summary (Last 24 hours) at 06/16/15 1038 Last data filed at 06/16/15 0549  Gross per 24 hour  Intake 1952.5 ml  Output    650 ml  Net 1302.5 ml   Exam 06/15/2015; not repeated today Sclerae unicteric  No cervical or supraclavicular adenopathy  Lungs no rales or rhonchi--auscultated anterolaterally  Heart regular rate and rhythm  Abdomen soft  MSK ns/p R nail placement--wound not examined  Neuro nonfocal   CBG (last 3)  No results for input(s): GLUCAP in the last 72 hours.   Labs:  Lab Results  Component Value Date   WBC 10.7* 06/16/2015   HGB 7.7* 06/16/2015   HCT 32.3* 06/16/2015   MCV 130.8* 06/16/2015   PLT 144* 06/16/2015   NEUTROABS 4.5 06/13/2015    '@LASTCHEMISTRY'$ @  Urine Studies No results for input(s): UHGB, CRYS in the last 72 hours.  Invalid input(s): UACOL, UAPR, USPG, UPH, UTP, UGL, UKET, UBIL, UNIT, UROB, ULEU, UEPI, UWBC, URBC, UBAC, CAST, UCOM, BILUA  Basic Metabolic Panel:  Recent Labs Lab 06/13/15 1104 06/14/15 0300 06/16/15 0323  NA 139 139 137  K 4.0 3.9 5.0  CL  --  101 103  CO2 '26 27 27  '$ GLUCOSE 110 117* 146*  BUN 12.7 16 24*  CREATININE 1.1 0.99 1.01*  CALCIUM 11.0* 9.8 9.0   GFR Estimated Creatinine Clearance: 83.5 mL/min (by C-G formula based on Cr of 1.01). Liver Function Tests:  Recent Labs Lab 06/13/15 1104 06/14/15 0300  AST 42* 38  ALT 33 28  ALKPHOS 187* 142*  BILITOT 0.80 1.0  PROT 8.9* 7.9  ALBUMIN 4.2 4.0   No results for  input(s): LIPASE, AMYLASE in the last 168 hours. No results for input(s): AMMONIA in the last 168 hours. Coagulation profile No results for input(s): INR, PROTIME in the last 168 hours.  CBC:  Recent Labs Lab 06/13/15 1104 06/16/15 0500  WBC 7.4 10.7*  NEUTROABS 4.5  --   HGB 11.9 7.7*  HCT 36.6 32.3*  MCV 91.6 130.8*  PLT 202 144*   Cardiac Enzymes: No results for input(s): CKTOTAL, CKMB, CKMBINDEX, TROPONINI in the last 168 hours. BNP: Invalid input(s): POCBNP CBG: No results for input(s): GLUCAP in the last 168 hours. D-Dimer No results for input(s): DDIMER in the last 72 hours. Hgb A1c No results for input(s): HGBA1C in the last 72 hours. Lipid Profile No results for input(s): CHOL, HDL, LDLCALC, TRIG, CHOLHDL, LDLDIRECT in the last 72 hours. Thyroid function studies No results for input(s): TSH, T4TOTAL, T3FREE, THYROIDAB in the last 72 hours.  Invalid input(s): FREET3 Anemia work up No results for input(s): VITAMINB12, FOLATE, FERRITIN, TIBC, IRON, RETICCTPCT in the last 72 hours. Microbiology Recent Results (from the past 240 hour(s))  Surgical pcr screen     Status: None   Collection Time: 06/15/15  5:07 AM  Result Value Ref Range Status   MRSA, PCR NEGATIVE NEGATIVE Final   Staphylococcus aureus NEGATIVE NEGATIVE  Final    Comment:        The Xpert SA Assay (FDA approved for NASAL specimens in patients over 41 years of age), is one component of a comprehensive surveillance program.  Test performance has been validated by [facility] ([facility]) for patients greater than or equal to 68 year old. It is not intended to diagnose infection nor to guide or monitor treatment.       Studies:  Dg C-arm 1-60 Min-no Report  06/15/2015  CLINICAL DATA: ORIF right hip C-ARM 1-60 MINUTES Fluoroscopy was utilized by the requesting physician.  No radiographic interpretation.   Dg Hip Operative Unilat With Pelvis Right  06/15/2015  CLINICAL DATA:  60 year old female  undergoing fixation of the right femur EXAM: DG C-ARM 1-60 MIN-NO REPORT; OPERATIVE RIGHT HIP WITH PELVIS COMPARISON:  Prior hip radiographs 05/22/2015 FINDINGS: A total of 7 intraoperative spot radiographs demonstrates placement of an intra medullary nail with 2 distal interlocking screws and 2 proximal transfemoral neck cannulated lag screws. Irregular lytic lesion in the mid femoral diaphysis better seen on prior CT. No evidence of acute hardware complication. IMPRESSION: Placement of right femoral intra medullary nail with 2 distal interlocking screws and 2 transfemoral neck cannulated lag screws without evidence of acute complication. Irregular lytic lesion in the mid femoral diaphysis better seen on recent CT scan. Electronically Signed   By: Jacqulynn Cadet M.D.   On: 06/15/2015 09:21   COURTESY VISIT  Assessment: 60 y.o.  woman s/p intramedullary intertrochanteric nail placement 06/15/2015 for a R femoral lytic lesion with impending pathologic fracture, with history otherswise as follows  1. Metastatic colon cancer, most recently treated with FOLFOX/Avastin on 05/22/2015  Restaging CT scans 06/10/2015 with decreased pulmonary metastases, mild increase in the size of a right iliac lesion, stable T6  metastasis--further treatment pr Dr Benay Spice 2. History of a right upper extremity DVT, diagnosed 07/01/2012, currently on rivaroxaban 3. Hypertension 4.R femoral lytic lesion at risk of pathologic fracture, s/p interamedullary rod placement 06/15/2015   Plan:  For transfusion today. Pain meds being titrated. Nothing to add to your excellent management. Dr Benay Spice will follow up in AM  Chauncey Cruel, MD 06/16/2015  10:38 AM Medical Oncology and Hematology [facility] [place], [place] [address] Tel. [phone]    Fax. [phone]

## 2015-06-16 NOTE — Progress Notes (Signed)
Utilization review completed.  

## 2015-06-16 NOTE — Progress Notes (Signed)
Subjective: 1 Day Post-Op Procedure(s) (LRB): INTRAMEDULLARY (IM) NAIL INTERTROCHANTERIC  (Right) Patient reports pain as moderate.  Acute blood loss anemia from surgery.  Objective: Vital signs in last 24 hours: Temp:  [97.4 F (36.3 C)-98.6 F (37 C)] 97.9 F (36.6 C) (02/19 0548) Pulse Rate:  [87-100] 87 (02/19 0548) Resp:  [12-16] 16 (02/19 0548) BP: (107-165)/(71-96) 143/86 mmHg (02/19 0548) SpO2:  [95 %-100 %] 97 % (02/19 0548) Weight:  [117.028 kg (258 lb)] 117.028 kg (258 lb) (02/18 1200)  Intake/Output from previous day: 02/18 0701 - 02/19 0700 In: 3652.5 [P.O.:960; I.V.:2642.5; IV Piggyback:50] Out: 1050 [Urine:650; Blood:400] Intake/Output this shift:     Recent Labs  06/13/15 1104 06/16/15 0500  HGB 11.9 7.7*    Recent Labs  06/13/15 1104 06/16/15 0500  WBC 7.4 10.7*  RBC 4.00 2.47*  HCT 36.6 32.3*  PLT 202 144*    Recent Labs  06/14/15 0300 06/16/15 0323  NA 139 137  K 3.9 5.0  CL 101 103  CO2 27 27  BUN 16 24*  CREATININE 0.99 1.01*  GLUCOSE 117* 146*  CALCIUM 9.8 9.0   No results for input(s): LABPT, INR in the last 72 hours.  Sensation intact distally Intact pulses distally Dorsiflexion/Plantar flexion intact Incision: scant drainage  Assessment/Plan: 1 Day Post-Op Procedure(s) (LRB): INTRAMEDULLARY (IM) NAIL INTERTROCHANTERIC  (Right) Up with therapy - full weight bearing on right lower extremity. Will transfuse blood today as well due to her anemia.  BLACKMAN,CHRISTOPHER Y 06/16/2015, 10:18 AM

## 2015-06-17 ENCOUNTER — Encounter: Payer: Self-pay | Admitting: *Deleted

## 2015-06-17 ENCOUNTER — Other Ambulatory Visit: Payer: Self-pay | Admitting: Nurse Practitioner

## 2015-06-17 ENCOUNTER — Encounter (HOSPITAL_COMMUNITY): Payer: Self-pay | Admitting: Orthopaedic Surgery

## 2015-06-17 DIAGNOSIS — C189 Malignant neoplasm of colon, unspecified: Secondary | ICD-10-CM

## 2015-06-17 DIAGNOSIS — C787 Secondary malignant neoplasm of liver and intrahepatic bile duct: Principal | ICD-10-CM

## 2015-06-17 LAB — TYPE AND SCREEN
ABO/RH(D): A POS
ANTIBODY SCREEN: NEGATIVE
UNIT DIVISION: 0
Unit division: 0

## 2015-06-17 LAB — CBC
HCT: 29 % — ABNORMAL LOW (ref 36.0–46.0)
Hemoglobin: 9.4 g/dL — ABNORMAL LOW (ref 12.0–15.0)
MCH: 29.7 pg (ref 26.0–34.0)
MCHC: 32.4 g/dL (ref 30.0–36.0)
MCV: 91.5 fL (ref 78.0–100.0)
PLATELETS: 147 10*3/uL — AB (ref 150–400)
RBC: 3.17 MIL/uL — ABNORMAL LOW (ref 3.87–5.11)
RDW: 18.4 % — AB (ref 11.5–15.5)
WBC: 10.4 10*3/uL (ref 4.0–10.5)

## 2015-06-17 NOTE — Progress Notes (Signed)
Physical Therapy Treatment Patient Details Name: [name] MRN: [id number] DOB: [birth year] Today's Date: 06/17/2015    History of Present Illness Pt s/p IM nailing of impending pathologic fx R femur.  Pt with hx of colon CA with mets     PT Comments    Pt required MAX encouragement to participate.  "not feeling well". Assisted OOB with increased time and amb in hallway a greater distance.  No c/o dizzyiess.  Assisted back to bed per pt request.  Declined to attempts stairs today so instructed her tomorrows PT will include stairs as pt has 7 to get into her home.   Follow Up Recommendations  Home health PT     Equipment Recommendations  Rolling walker with 5" wheels    Recommendations for Other Services       Precautions / Restrictions Precautions Precautions: Fall Restrictions Weight Bearing Restrictions: No RLE Weight Bearing: Weight bearing as tolerated    Mobility  Bed Mobility Overal bed mobility: Needs Assistance Bed Mobility: Supine to Sit;Sit to Supine     Supine to sit: Min assist Sit to supine: Mod assist   General bed mobility comments: cues for sequence and use of L LE to self assist  Transfers Overall transfer level: Needs assistance Equipment used: Rolling walker (2 wheeled) Transfers: Sit to/from Stand Sit to Stand: Supervision;Min guard         General transfer comment: cues for LE management and use of UEs to self assist  Ambulation/Gait Ambulation/Gait assistance: Supervision;Min guard Ambulation Distance (Feet): 35 Feet Assistive device: Rolling walker (2 wheeled) Gait Pattern/deviations: Step-to pattern;Decreased stance time - right;Trunk flexed Gait velocity: decr   General Gait Details: increased time and 25% VC's on safety with turns and backward gait   Stairs            Wheelchair Mobility    Modified Rankin (Stroke Patients Only)       Balance                                    Cognition  Arousal/Alertness: Awake/alert Behavior During Therapy: WFL for tasks assessed/performed Overall Cognitive Status: Within Functional Limits for tasks assessed                      Exercises      General Comments        Pertinent Vitals/Pain Pain Assessment: 0-10 Pain Score: 8  Pain Location: R hip Pain Descriptors / Indicators: Grimacing;Sore;Tender Pain Intervention(s): Monitored during session;Repositioned;Ice applied    Home Living                      Prior Function            PT Goals (current goals can now be found in the care plan section) Progress towards PT goals: Progressing toward goals    Frequency  7X/week    PT Plan Current plan remains appropriate    Co-evaluation             End of Session Equipment Utilized During Treatment: Gait belt Activity Tolerance: Patient tolerated treatment well Patient left: in bed;with call bell/phone within reach;with bed alarm set     Time: 1340-1408 PT Time Calculation (min) (ACUTE ONLY): 28 min  Charges:  $Gait Training: 8-22 mins $Therapeutic Activity: 8-22 mins  G Codes:      Rica Koyanagi  PTA [facility]  Acute  Rehab Pager      [phone]

## 2015-06-17 NOTE — Progress Notes (Signed)
Subjective: 2 Days Post-Op Procedure(s) (LRB): INTRAMEDULLARY (IM) NAIL INTERTROCHANTERIC  (Right) Patient reports pain as moderate.    Objective: Vital signs in last 24 hours: Temp:  [97.9 F (36.6 C)-98.7 F (37.1 C)] 98.1 F (36.7 C) (02/20 0607) Pulse Rate:  [94-109] 104 (02/20 0607) Resp:  [16-20] 16 (02/20 0607) BP: (123-165)/(57-86) 165/84 mmHg (02/20 0607) SpO2:  [94 %-100 %] 100 % (02/20 0607)  Intake/Output from previous day: 02/19 0701 - 02/20 0700 In: 1987 [P.O.:1080; I.V.:260; Blood:647] Out: 1 [Urine:1] Intake/Output this shift:     Recent Labs  06/16/15 0500 06/17/15 0500  HGB 7.7* 9.4*    Recent Labs  06/16/15 0500 06/17/15 0500  WBC 10.7* 10.4  RBC 2.47* 3.17*  HCT 32.3* 29.0*  PLT 144* 147*    Recent Labs  06/16/15 0323  NA 137  K 5.0  CL 103  CO2 27  BUN 24*  CREATININE 1.01*  GLUCOSE 146*  CALCIUM 9.0   No results for input(s): LABPT, INR in the last 72 hours.  Sensation intact distally Intact pulses distally Dorsiflexion/Plantar flexion intact Incision: no drainage  Assessment/Plan: 2 Days Post-Op Procedure(s) (LRB): INTRAMEDULLARY (IM) NAIL INTERTROCHANTERIC  (Right) Up with therapy Plan for discharge tomorrow  Mcarthur Rossetti 06/17/2015, 7:24 AM

## 2015-06-17 NOTE — Progress Notes (Signed)
  Oncology Nurse Navigator Documentation  Navigator Location: [facility] (06/17/15 1800)             Patient Visit Type: Inpatient (06/17/15 1800): Hospital visit       Interventions: Other (06/17/15 1800): Supportive listening; encouraged her to take care when climbing stairs in her home to get to bedroom-have daughter help. Encouraged her to ask PT to help her practice on some stairs before leaving hospital. Encouraged her to allow PT to assist her at home for a time period.                       Time Spent with Patient: 30 (06/17/15 1800)

## 2015-06-17 NOTE — Progress Notes (Signed)
IP PROGRESS NOTE  Subjective:   [name] underwent placement of an intramedullary nail at the right femur on 06/15/2015. She continues to have pain at the right femur. No other complaint.she felt better after a red cell transfusion yesterday.  Objective: Vital signs in last 24 hours: Blood pressure 165/84, pulse 104, temperature 98.1 F (36.7 C), temperature source Oral, resp. rate 16, height '5\' 11"'$  (1.803 m), weight 258 lb (117.028 kg), SpO2 100 %.  Intake/Output from previous day: 02/19 0701 - 02/20 0700 In: 1987 [P.O.:1080; I.V.:260; Blood:647] Out: 1 [Urine:1]  Physical Exam:  Lungs: Clear anteriorly Cardiac: Regular rate and rhythm Abdomen: Nontender, no hepatomegaly Extremities: No leg edema, ecchymosis at the right lateral thigh with dressings in place  Portacath/PICC-without erythema  Lab Results:  Recent Labs  06/16/15 0500 06/17/15 0500  WBC 10.7* 10.4  HGB 7.7* 9.4*  HCT 32.3* 29.0*  PLT 144* 147*    BMET  Recent Labs  06/16/15 0323  NA 137  K 5.0  CL 103  CO2 27  GLUCOSE 146*  BUN 24*  CREATININE 1.01*  CALCIUM 9.0    Studies/Results: No results found.  Medications: I have reviewed the patient's current medications.  Assessment/Plan:  1. Metastatic colon cancer, most recently treated with FOLFOX/Avastin on 05/22/2015  Restaging CT scans 06/10/2015 with decreased pulmonary metastases, mild increase in the size of a right iliac lesion, stable T6 metastasis 2. History of a right upper extremity DVT, diagnosed 07/01/2012,xarelto resumed 06/16/2015. 3. Hypertension 4. Presentation to the emergency room with right leg pain 06/12/2015-plain x-ray and CT confirmed a lucent lesion in the midportion of the right femur with erosive changes of the cortex, admitted for further evaluation and management  Status post placement of an intramedullary nail at the right femur 06/15/2015 5. Anemia secondary to surgical blood loss-status post a red cell  transfusion 06/16/2015   [name] is recovering from the right femur surgery.she is taking oxycodone for pain. I will refer her to Dr. Lisbeth Renshaw for palliative radiation to the right femur.  I discussed the 06/10/2015 restaging CT results with [name].I reviewed the CT images. My impression is she has disease progression involving multiple bone metastases. The change in the lung lesions may be evolving post radiation inflammation as opposed to a true response to chemotherapy.the CEA was higher when last checked.  [name] will be scheduled for an office visit within the next 2 weeks.    LOS: 4 days   Linda Coder, MD   06/17/2015, 9:40 AM

## 2015-06-17 NOTE — Op Note (Signed)
[name]                ACCOUNT NO.:  [id number]  MEDICAL RECORD NO.:  [id number]  LOCATION:                                 FACILITY:  PHYSICIAN:  Lind Guest. Ninfa Linden, M.D.DATE OF BIRTH:  30-Jun-1955  DATE OF PROCEDURE:  06/15/2015 DATE OF DISCHARGE:                              OPERATIVE REPORT   PREOPERATIVE DIAGNOSIS:  Impending pathologic fracture, right femoral shaft from metastatic lesion.  POSTOPERATIVE DIAGNOSIS:  Impending pathologic fracture, right femoral shaft from metastatic lesion.  PROCEDURE:  Prophylactic intramedullary nail placement, right femoral shaft.  IMPLANTS:  [employer]hew 10 x 40 right reconstruction nail with 2 85 mm Recon screws proximally and 2 5.0 distal interlocking screws.  SURGEON:  Lind Guest. Ninfa Linden, MD  ANESTHESIA:  General.  ANTIBIOTICS:  2 g IV Ancef.  BLOOD LOSS:  400 mL.  COMPLICATIONS:  None.  INDICATIONS:  [name] is a 60 year old female with metastatic colon cancer.  She has developed severe right thigh pain and a CT scan and plain films showed a large lytic lesion in the midshaft of the femur. Due to the weakening in the bone, it has been recommended she undergo intramedullary nail placement to stabilize the femur to allow for full weightbearing.  I talked to her about this in length and she understands this corresponded with Dr. Julieanne Manson, her oncologist and we came up with a plan for proceeding with surgery.  PROCEDURE DESCRIPTION:  After informed consent was obtained, appropriate right leg was marked.  She was brought to the operating room, and general anesthesia was obtained while she was on her stretcher.  Next, she was placed supine on the fracture table with the right leg in in- line skeletal traction, but no traction had to be applied and her perineal post in place and her left hip in abduction, stirrup with appropriate padding in the popliteal area.  We then prepped her right hip and leg  and thigh with DuraPrep and sterile drapes.  Time-out was called and she was identified as correct patient, correct right femur. We then made incision proximal to the greater trochanter and carried this down to the tip of the greater trochanter.  Under direct fluoroscopic guidance, we placed temporary guide pin from the tip of greater trochanter down the lesser trochanter and using initiating reamer, we opened up the femoral canal.  We then placed a temporary guide rod all the way down to the knee.  We took a measurement off this and chose a 10 x 40 right reconstruction nail.  We then began reaming in 5 mm increments from 8.5 up to 11.5.  We then placed our 10 x 40 rod without difficulty and removed the guide rod.  We then used the outrigger guide, off the intramedullary nail to place 2 reconstruction screws through a separate lateral incision up into the femoral head measuring 85 mm in length for 6.4 size screws.  Distally, we made once incision and placed 2 distal interlocking screws from lateral to medial. This was all done under direct fluoroscopy.  She did have a significant amount of bleeding during this case due to her significant obesity as well.  We  then were able to irrigate all wounds, removed the outrigger guide, and we closed the deep tissue with 0 Vicryl followed by 2-0 Vicryl subcutaneous tissue, interrupted staples on the skin on all incisions.  She was then taken off the fracture table, awakened, extubated, and taken to recovery room in stable condition.  All final counts were correct.  There were no complications noted. Postoperatively, we will let her weightbear as tolerated and she can probably have radiation therapy within the next 2 weeks on her thigh.     Lind Guest. Ninfa Linden, M.D.     CYB/MEDQ  D:  06/15/2015  T:  06/15/2015  Job:  [id number]

## 2015-06-17 NOTE — Care Management Important Message (Signed)
Important Message  Patient Details  Name: [name] MRN: [id number] Date of Birth: 04-01-1956   Medicare Important Message Given:  Yes    Camillo Flaming 06/17/2015, 3:46 [place] Message  Patient Details  Name: [name] MRN: [id number] Date of Birth: 1955/06/01   Medicare Important Message Given:  Yes    Camillo Flaming 06/17/2015, 3:45 PM

## 2015-06-17 NOTE — Progress Notes (Signed)
OT Cancellation Note  Patient Details Name: [name] MRN: [id number] DOB: 12/17/1955   Cancelled Treatment:    Pt refused stating she is just not feeling well. Pt states pain is improved but overall not feeling well. Will communicate with RN and check on pt later in day  Kari Baars, [place] Payton Mccallum D 06/17/2015, 11:23 AM

## 2015-06-18 MED ORDER — OXYCODONE-ACETAMINOPHEN 5-325 MG PO TABS: 1.0000 | ORAL_TABLET | ORAL | Status: DC | PRN

## 2015-06-18 MED ORDER — METHOCARBAMOL 500 MG PO TABS: 500.0000 mg | ORAL_TABLET | Freq: Four times a day (QID) | ORAL | Status: DC | PRN

## 2015-06-18 MED ORDER — HEPARIN SOD (PORK) LOCK FLUSH 100 UNIT/ML IV SOLN
500.0000 [IU] | INTRAVENOUS | Status: AC | PRN
  Administered 2015-06-18: 500 [IU]

## 2015-06-18 NOTE — Discharge Summary (Signed)
Patient ID: [name] MRN: [id number] DOB/AGE: [birth year] 60 y.o.  Admit date: 06/13/2015 Discharge date: 06/18/2015  Admission Diagnoses:  Principal Problem:   Lytic bone lesion of right femur Active Problems:   Metastatic colon cancer to liver [facility])   Malignant neoplasm metastatic to lung [facility])   Discharge Diagnoses:  Same  Past Medical History  Diagnosis Date  . Hypertension     pt states she stopped taking meds while in her 20's  . Blood in stool   . History of blood transfusion   . Anemia   . Eczema   . Heart murmur     does not see a cardiologist has been told she does has a murmur and also that she does not have a murmur  . Anxiety   . Neuromuscular disorder (HCC)     numbness feet  . Hx of radiation therapy 10/06/13,16th,19th,22nd,&10/18/13    SBRT lung  . Cancer ([place])   . Metastatic colon cancer to liver [facility])     Surgeries: Procedure(s): INTRAMEDULLARY (IM) NAIL INTERTROCHANTERIC  on 06/13/2015 - 06/15/2015   Consultants: Treatment Team:  Mcarthur Rossetti, MD Ladell Pier, MD  Discharged Condition: Improved  Hospital Course: Kemani Heidel is an 60 y.o. female who was admitted 06/13/2015 for operative treatment ofLytic bone lesion of right femur. Patient has severe unremitting pain that affects sleep, daily activities, and work/hobbies. After pre-op clearance the patient was taken to the operating room on 06/13/2015 - 06/15/2015 and underwent  Procedure(s): INTRAMEDULLARY (IM) NAIL INTERTROCHANTERIC .    Patient was given perioperative antibiotics: Anti-infectives    Start     Dose/Rate Route Frequency Ordered Stop   06/15/15 1400  ceFAZolin (ANCEF) IVPB 2 g/50 mL premix     2 g 100 mL/hr over 30 Minutes Intravenous 3 times per day 06/15/15 1043 06/16/15 0641       Patient was given sequential compression devices, early ambulation, and chemoprophylaxis to prevent DVT.  Patient benefited maximally from hospital stay and there were no  complications.    Recent vital signs: Patient Vitals for the past 24 hrs:  BP Temp Temp src Pulse Resp SpO2  06/18/15 0450 (!) 145/82 mmHg - - - - -  06/18/15 0447 (!) 163/92 mmHg 98.7 F (37.1 C) Oral (!) 103 16 96 %  06/17/15 2035 (!) 149/73 mmHg 98.6 F (37 C) Oral (!) 107 16 98 %  06/17/15 1435 128/76 mmHg 98.8 F (37.1 C) Oral (!) 101 16 97 %  06/17/15 1017 (!) 157/66 mmHg - - - - -     Recent laboratory studies:  Recent Labs  06/16/15 0323 06/16/15 0500 06/17/15 0500  WBC  --  10.7* 10.4  HGB  --  7.7* 9.4*  HCT  --  32.3* 29.0*  PLT  --  144* 147*  NA 137  --   --   K 5.0  --   --   CL 103  --   --   CO2 27  --   --   BUN 24*  --   --   CREATININE 1.01*  --   --   GLUCOSE 146*  --   --   CALCIUM 9.0  --   --      Discharge Medications:     Medication List    TAKE these medications        amLODipine 10 MG tablet  Commonly known as:  NORVASC  TAKE 1 TABLET BY MOUTH EVERY DAY  calcium-vitamin D 500-200 MG-UNIT tablet  Commonly known as:  OSCAL WITH D  Take 1 tablet by mouth daily with breakfast.     diclofenac sodium 1 % Gel  Commonly known as:  VOLTAREN  Apply 2 g topically 4 (four) times daily.     hydrochlorothiazide 12.5 MG capsule  Commonly known as:  MICROZIDE  Take 1 capsule (12.5 mg total) by mouth daily.     IRON PO  Take 1 tablet by mouth daily.     lidocaine-prilocaine cream  Commonly known as:  EMLA  Apply topically as needed. Apply to [facility] site 1-2 hours prior to stick and cover with plastic wrap to numb site     loperamide 2 MG capsule  Commonly known as:  IMODIUM  TAKE ONE CAPSULE BY MOUTH AS NEEDED FOR DIARRHEA OR LOOSE STOOLS. MAX OF 8 CAPSULES PER DAY     LORazepam 0.5 MG tablet  Commonly known as:  ATIVAN  Place 1 tablet (0.5 mg total) under the tongue every 6 (six) hours as needed (for nausea). Causes drowsiness     methocarbamol 500 MG tablet  Commonly known as:  ROBAXIN  Take 1 tablet (500 mg total) by mouth every 6  (six) hours as needed for muscle spasms.     multivitamin tablet  Take 1 tablet by mouth daily.     naproxen sodium 220 MG tablet  Commonly known as:  ANAPROX  Take 220 mg by mouth 2 (two) times daily as needed (leg pain).     ondansetron 8 MG tablet  Commonly known as:  ZOFRAN  TAKE 1 TABLET BY MOUTH EVERY 8 HOURS AS NEEDED FOR NAUSEA AND VOMITING     oxyCODONE-acetaminophen 5-325 MG tablet  Commonly known as:  PERCOCET/ROXICET  Take 1-2 tablets by mouth every 4 (four) hours as needed for severe pain.     polyethylene glycol powder powder  Commonly known as:  MIRALAX  Take 17 g by mouth daily as needed.     potassium chloride SA 20 MEQ tablet  Commonly known as:  K-DUR,KLOR-CON  TAKE 1 TABLET BY MOUTH EVERY DAY     prochlorperazine 10 MG tablet  Commonly known as:  COMPAZINE  TAKE 1 TABLET BY MOUTH EVERY 6 HOURS AS NEEDED FOR NAUSEA OR VOMITING     XARELTO 20 MG Tabs tablet  Generic drug:  rivaroxaban  TAKE 1 TABLET BY MOUTH EVERY DAY        Diagnostic Studies: Ct Chest W Contrast  06/10/2015  CLINICAL DATA:  Followup metastatic colon carcinoma to liver. Completed chemotherapy approximately 3 weeks ago. Restaging. EXAM: CT CHEST, ABDOMEN, AND PELVIS WITH CONTRAST TECHNIQUE: Multidetector CT imaging of the chest, abdomen and pelvis was performed following the standard protocol during bolus administration of intravenous contrast. CONTRAST:  115m OMNIPAQUE IOHEXOL 300 MG/ML  SOLN COMPARISON:  03/19/2015 FINDINGS: CT CHEST FINDINGS Mediastinum/Lymph Nodes: Sub-cm left thyroid lobe nodule shows further decrease in size since previous study. No hilar or mediastinal lymphadenopathy identified. No other sites of lymphadenopathy identified within the thorax. Lungs/Pleura: Bilateral pulmonary metastases if decreased in size. Largest in the left upper lobe measures 4.1 x 3.1 cm on image 17/series 4 compared to 5.2 x 4.0 cm previously. Largest in the right middle lobe measures 3.2 x 3.4 cm  on image 31/series 4 compared to 4.2 x 3.5 cm previously. No evidence of pleural effusion. Musculoskeletal: Lytic bone metastasis involving the T6 vertebral body is stable in appearance. No evidence pathologic fracture . CT  ABDOMEN PELVIS FINDINGS Hepatobiliary: Diffuse hepatic steatosis is again demonstrated. Postsurgical changes again seen in the posterior right hepatic lobe from previous partial hepatectomy. No definite liver masses identified. Prior cholecystectomy again noted. No evidence of biliary ductal dilatation. Pancreas: No mass, inflammatory changes, or other significant abnormality. Spleen: Within normal limits in size and appearance. Adrenals/Urinary Tract: No masses identified. No evidence of hydronephrosis. Stomach/Bowel: No evidence of obstruction, inflammatory process, or abnormal fluid collections. Stable postop changes from previous colonic resections. Vascular/Lymphatic: No pathologically enlarged lymph nodes. No evidence of abdominal aortic aneurysm. Reproductive: Prior hysterectomy noted. Adnexal regions are unremarkable in appearance. Other: None. Musculoskeletal: Two lytic bone metastases involving the right ilium show mild increase in size since previous study. Index lesion on image 85/series 2 currently measures 1.4 x 2.0 cm compared to 0.8 x 0.9 cm previously. IMPRESSION: Decreased size of bilateral pulmonary metastases since prior study. Stable hepatic steatosis and postop changes from partial hepatectomy. No definite liver metastases. Mild increase in size of lytic bone metastases in the right ilium. Stable T6 vertebral body lytic metastasis. Electronically Signed   By: Earle Gell M.D.   On: 06/10/2015 11:30   Ct Abdomen Pelvis W Contrast  06/10/2015  CLINICAL DATA:  Followup metastatic colon carcinoma to liver. Completed chemotherapy approximately 3 weeks ago. Restaging. EXAM: CT CHEST, ABDOMEN, AND PELVIS WITH CONTRAST TECHNIQUE: Multidetector CT imaging of the chest, abdomen and  pelvis was performed following the standard protocol during bolus administration of intravenous contrast. CONTRAST:  121m OMNIPAQUE IOHEXOL 300 MG/ML  SOLN COMPARISON:  03/19/2015 FINDINGS: CT CHEST FINDINGS Mediastinum/Lymph Nodes: Sub-cm left thyroid lobe nodule shows further decrease in size since previous study. No hilar or mediastinal lymphadenopathy identified. No other sites of lymphadenopathy identified within the thorax. Lungs/Pleura: Bilateral pulmonary metastases if decreased in size. Largest in the left upper lobe measures 4.1 x 3.1 cm on image 17/series 4 compared to 5.2 x 4.0 cm previously. Largest in the right middle lobe measures 3.2 x 3.4 cm on image 31/series 4 compared to 4.2 x 3.5 cm previously. No evidence of pleural effusion. Musculoskeletal: Lytic bone metastasis involving the T6 vertebral body is stable in appearance. No evidence pathologic fracture . CT ABDOMEN PELVIS FINDINGS Hepatobiliary: Diffuse hepatic steatosis is again demonstrated. Postsurgical changes again seen in the posterior right hepatic lobe from previous partial hepatectomy. No definite liver masses identified. Prior cholecystectomy again noted. No evidence of biliary ductal dilatation. Pancreas: No mass, inflammatory changes, or other significant abnormality. Spleen: Within normal limits in size and appearance. Adrenals/Urinary Tract: No masses identified. No evidence of hydronephrosis. Stomach/Bowel: No evidence of obstruction, inflammatory process, or abnormal fluid collections. Stable postop changes from previous colonic resections. Vascular/Lymphatic: No pathologically enlarged lymph nodes. No evidence of abdominal aortic aneurysm. Reproductive: Prior hysterectomy noted. Adnexal regions are unremarkable in appearance. Other: None. Musculoskeletal: Two lytic bone metastases involving the right ilium show mild increase in size since previous study. Index lesion on image 85/series 2 currently measures 1.4 x 2.0 cm compared  to 0.8 x 0.9 cm previously. IMPRESSION: Decreased size of bilateral pulmonary metastases since prior study. Stable hepatic steatosis and postop changes from partial hepatectomy. No definite liver metastases. Mild increase in size of lytic bone metastases in the right ilium. Stable T6 vertebral body lytic metastasis. Electronically Signed   By: JEarle GellM.D.   On: 06/10/2015 11:30   Ct Femur Right Wo Contrast  06/12/2015  CLINICAL DATA:  60year old female with right thigh and right leg pain. No injury.  EXAM: CT OF THE RIGHT FEMUR WITHOUT CONTRAST TECHNIQUE: Multidetector CT imaging was performed according to the standard protocol. Multiplanar CT image reconstructions were also generated. COMPARISON:  Radiograph dated 05/22/2015 FINDINGS: There is a partially visualized 4.3 x 2.3 cm lucent lesion in the inferior aspect of the right iliac bone superior to the right acetabulum. There is associated destruction of the cortex of the bone suggestive of aggressive nature of the lesion. A second lesion is noted in the midportion of the right femur measuring approximately 1.7 x 6.6 cm. There is partial erosive changes of the cortex of the bone adjacent to this lesion. Findings are concerning for metastatic disease. Other etiologies are not excluded. Further evaluation with bone scan or PET-CT is recommended to evaluate for possible additional lesions. There is no acute fracture or dislocation. The soft tissues appear unremarkable. IMPRESSION: Lytic lesion involving the inferior aspect of the right iliac bone as well as a lesion involving the midportion of the right femur most concerning for metastatic disease. Correlation with clinical exam and further evaluation with bone scan or PET-CT recommended. No acute fracture or dislocation. Electronically Signed   By: Anner Crete M.D.   On: 06/12/2015 21:30   Dg Knee Complete 4 Views Right  06/12/2015  CLINICAL DATA:  60 year old female with increasing right femoral  pain and a history of metastatic colon cancer. EXAM: RIGHT KNEE - COMPLETE 4+ VIEW COMPARISON:  Concurrently obtained radiographs of the right femur; Prior radiographs of the pelvis and right hip 05/22/2015 FINDINGS: There is no evidence of fracture, dislocation, or joint effusion. There is no evidence of arthropathy or other focal bone abnormality. Soft tissues are unremarkable. IMPRESSION: Negative. Electronically Signed   By: Jacqulynn Cadet M.D.   On: 06/12/2015 20:18   Dg C-arm 1-60 Min-no Report  06/15/2015  CLINICAL DATA: ORIF right hip C-ARM 1-60 MINUTES Fluoroscopy was utilized by the requesting physician.  No radiographic interpretation.   Dg Hip Operative Unilat With Pelvis Right  06/15/2015  CLINICAL DATA:  60 year old female undergoing fixation of the right femur EXAM: DG C-ARM 1-60 MIN-NO REPORT; OPERATIVE RIGHT HIP WITH PELVIS COMPARISON:  Prior hip radiographs 05/22/2015 FINDINGS: A total of 7 intraoperative spot radiographs demonstrates placement of an intra medullary nail with 2 distal interlocking screws and 2 proximal transfemoral neck cannulated lag screws. Irregular lytic lesion in the mid femoral diaphysis better seen on prior CT. No evidence of acute hardware complication. IMPRESSION: Placement of right femoral intra medullary nail with 2 distal interlocking screws and 2 transfemoral neck cannulated lag screws without evidence of acute complication. Irregular lytic lesion in the mid femoral diaphysis better seen on recent CT scan. Electronically Signed   By: Jacqulynn Cadet M.D.   On: 06/15/2015 09:21   Dg Hip Unilat With Pelvis 2-3 Views Right  05/22/2015  CLINICAL DATA:  Pt complains of right hip and groin pain x 1 month with no injury. It is difficult for pt to bare weight and walks with a limp. Hx of colon ca with mets to the liver. EXAM: DG HIP (WITH OR WITHOUT PELVIS) 2-3V RIGHT COMPARISON:  None. FINDINGS: Femoral heads are located. Degenerate sclerosis of both sacroiliac  joints is mild. No acute fracture. No focal osseous lesion. Mild osteophyte formation about the right acetabulum. IMPRESSION: Degenerative change, without acute osseous finding. Electronically Signed   By: Abigail Miyamoto M.D.   On: 05/22/2015 15:34   Dg Femur, Min 2 Views Right  06/12/2015  CLINICAL DATA:  Increasing right  femur pain x 1 month with great intensity x 2 days with out injury - metastatic colon cancer - pain concentrated proximal femur radiating to right knee EXAM: RIGHT FEMUR 2 VIEWS COMPARISON:  None. FINDINGS: No fracture. Knee joint normally aligned. Hip joint incompletely imaged. There is a subtle area of irregular widening of the medullary cavity of the mid femur without a discrete lesion. Mild resorption of the end ostium is suggested. Soft tissues are unremarkable. IMPRESSION: 1. No fracture. 2. Questionable bone lesion in the mid femur. This could be further assessed with CT or MRI or bone scan. Electronically Signed   By: Lajean Manes M.D.   On: 06/12/2015 20:18    Disposition: 01-Home or Self Care      Discharge Instructions    Discharge patient    Complete by:  As directed            Follow-up Information    Follow up with Mcarthur Rossetti, MD. Schedule an appointment as soon as possible for a visit in 2 weeks.   Specialty:  Orthopedic Surgery   Contact information:   [place] [address] [phone]        Signed: Mcarthur Rossetti 06/18/2015, 7:12 AM

## 2015-06-18 NOTE — Progress Notes (Signed)
I stopped by to see the patient today to let her know that Dr. Lisbeth Renshaw is aware of her recent surgery and need for palliative radiotherapy. I have made an appointment for her tomorrow to see Dr. Lisbeth Renshaw at 1:25 pm and for simulation at 2pm. She is going to contact [facility] for her transportation needs and will let us know if we need to reschedule this appointment.  Carola Rhine, PAC

## 2015-06-18 NOTE — Discharge Instructions (Signed)
You can put full weight on your right leg as comfort allows. Ice as needed for swelling. You can get your current dressings wet daily in the shower and leave them on until your ortho outpatient follow-up

## 2015-06-18 NOTE — Progress Notes (Signed)
Patient ID: [name], female   DOB: 1955/08/23, 60 y.o.   MRN: [id number] Doing well overall.  Can be discharged to home today.

## 2015-06-18 NOTE — Progress Notes (Signed)
Physical Therapy Treatment Patient Details Name: [name] MRN: [id number] DOB: 31-Jan-1956 Today's Date: 06/18/2015    History of Present Illness Pt s/p IM nailing of impending pathologic fx R femur.  Pt with hx of colon CA with mets     PT Comments    POD # 5 pt dressed and eager to D/C to home.  Practiced stairs using one cane and one rail.  Follow Up Recommendations  Home health PT     Equipment Recommendations  Rolling walker with 5" wheels    Recommendations for Other Services       Precautions / Restrictions Precautions Precautions: Fall Restrictions Weight Bearing Restrictions: No RLE Weight Bearing: Weight bearing as tolerated    Mobility  Bed Mobility Overal bed mobility: Needs Assistance Bed Mobility: Supine to Sit     Supine to sit: Supervision     General bed mobility comments: OOB in recliner  Transfers Overall transfer level: Needs assistance Equipment used: Rolling walker (2 wheeled) Transfers: Sit to/from Stand Sit to Stand: Supervision         General transfer comment: good safety cognition and use of hands  Ambulation/Gait Ambulation/Gait assistance: Supervision Ambulation Distance (Feet): 55 Feet Assistive device: Rolling walker (2 wheeled) Gait Pattern/deviations: Step-to pattern;Decreased stance time - right     General Gait Details: good safety cognition   Stairs Stairs: Yes Stairs assistance: Min guard Stair Management: One rail Left;Forwards;With cane Number of Stairs: 4 General stair comments: 25% VC's on proper sequencing and cane placement.   Wheelchair Mobility    Modified Rankin (Stroke Patients Only)       Balance                                    Cognition Arousal/Alertness: Awake/alert Behavior During Therapy: WFL for tasks assessed/performed Overall Cognitive Status: Within Functional Limits for tasks assessed                      Exercises      General Comments         Pertinent Vitals/Pain Pain Assessment: 0-10 Pain Score: 3  Pain Location: R hip Pain Descriptors / Indicators: Sore Pain Intervention(s): Premedicated before session;Repositioned;Ice applied    Home Living Family/patient expects to be discharged to:: Private residence Living Arrangements: Children Available Help at Discharge: Family Type of Home: Apartment Home Access: Level entry   Home Layout: Two level [place]: Radio producer - single point Additional Comments: Home with dtr and grandchild    Prior Function Level of Independence: Independent          PT Goals (current goals can now be found in the care plan section) Acute Rehab PT Goals Patient Stated Goal: Regain IND Progress towards PT goals: Progressing toward goals    Frequency  7X/week    PT Plan Current plan remains appropriate    Co-evaluation             End of Session Equipment Utilized During Treatment: Gait belt Activity Tolerance: Patient tolerated treatment well Patient left: with call bell/phone within reach     Time: 1145-1210 PT Time Calculation (min) (ACUTE ONLY): 25 min  Charges:  $Gait Training: 8-22 mins $Therapeutic Activity: 8-22 mins                    G Codes:      Rica Koyanagi  PTA [facility]  Acute  Rehab Pager      [phone]

## 2015-06-18 NOTE — Progress Notes (Signed)
[facility]   [facility] is providing the following services: [name]  If patient discharges after hours, please call [phone].   [name] 06/18/2015, 9:29 AM

## 2015-06-18 NOTE — Care Management Note (Signed)
Case Management Note  Patient Details  Name: Anniebell Bedore MRN: [id number] Date of Birth: [birth year]  Subjective/Objective:                  Lytic bone lesion Action/Plan: Discharge planning  Expected Discharge Date:  06/18/15               Expected Discharge Plan:  [place]  In-House Referral:     Discharge planning Services  CM Consult  Post Acute Care Choice:  Durable Medical Equipment Choice offered to:  Patient  DME Arranged:  Walker rolling DME Agency:     HH Arranged:    [place]:     Status of Service:  Completed, signed off  Medicare Important Message Given:  Yes Date Medicare IM Given:    Medicare IM give by:    Date Additional Medicare IM Given:    Additional Medicare Important Message give by:     If discussed at [place] of Stay Meetings, dates discussed:    Additional Comments: CM met with pt in room to offer choice of home health agency.  Pt politely refuses all home services.  CM called [facility] DME rep, Lecretia to please delive rthe rolling walker to room prior to discharge.  No other CM needs were communicated. Dellie Catholic, RN 06/18/2015, 11:58 AM

## 2015-06-19 ENCOUNTER — Ambulatory Visit: Payer: Medicare Other | Admitting: Radiation Oncology

## 2015-06-19 ENCOUNTER — Ambulatory Visit
Admission: RE | Admit: 2015-06-19 | Discharge: 2015-06-19 | Disposition: A | Discharge status: Home / Self Care | Payer: Medicare Other | Source: Ambulatory Visit | Attending: Radiation Oncology | Admitting: Radiation Oncology

## 2015-06-19 ENCOUNTER — Encounter: Payer: Self-pay | Admitting: Radiation Oncology

## 2015-06-19 VITALS — BP 143/78 | HR 101 | Temp 98.3°F | Resp 20 | Ht 71.0 in

## 2015-06-19 DIAGNOSIS — C7951 Secondary malignant neoplasm of bone: Secondary | ICD-10-CM | POA: Diagnosis present

## 2015-06-19 DIAGNOSIS — Z923 Personal history of irradiation: Secondary | ICD-10-CM | POA: Insufficient documentation

## 2015-06-19 DIAGNOSIS — C7800 Secondary malignant neoplasm of unspecified lung: Secondary | ICD-10-CM | POA: Diagnosis not present

## 2015-06-19 DIAGNOSIS — Z510 Encounter for antineoplastic radiation therapy: Secondary | ICD-10-CM | POA: Diagnosis not present

## 2015-06-19 DIAGNOSIS — C189 Malignant neoplasm of colon, unspecified: Secondary | ICD-10-CM | POA: Diagnosis present

## 2015-06-19 NOTE — Progress Notes (Signed)
Recon Metastatic Colon cancer to  lung/liver now to Right Femur   Expand All Collapse All   06/13/2015 - 06/15/2015  9:19 AM  PATIENT: [name] 60 y.o. female  PRE-OPERATIVE DIAGNOSIS: impending pathologic fracture right femur  POST-OPERATIVE DIAGNOSIS: impending pathologic fracture right femur  PROCEDURE: Procedure(s): INTRAMEDULLARY (IM) NAIL INTERTROCHANTERIC (Right)  SURGEON: Surgeon(s) and Role:  Mcarthur Rossetti, MD - Primary, Follow up 06/18/15      CT 06/12/15: IMPRESSION: Lytic lesion involving the inferior aspect of the right iliac bone as well as a lesion involving the midportion of the right femur most concerning for metastatic disease. Correlation with clinical exam and further evaluation with bone scan or PET-CT recommended.   Radiation Treatments:Lung  Completed 10/06/13;-,  10/18/13 SBRT   Dr. Benay Spice seen 06/13/15: Folfox/Avastin completing cycle 5 om 05/22/15, for palliative radiation to right femur  Patient pain 8/10 level right leg,  Wearing yellow fall risk band right wrist, in w/c and holding cane, has walker at home, D/C hospital yesterday CT today at 2pm   BP 143/78 mmHg  Pulse 101  Temp(Src) 98.3 F (36.8 C) (Oral)  Resp 20  Ht '5\' 11"'$  (1.803 m)  Wt   Wt Readings from Last 3 Encounters:  06/15/15 258 lb (117.028 kg)  06/13/15 258 lb 12.8 oz (117.391 kg)  05/22/15 263 lb 9.6 oz (119.568 kg)

## 2015-06-19 NOTE — Progress Notes (Signed)
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    ICD-9-CM ICD-10-CM   1. Bone metastasis (HCC) 198.5 C79.51      Interval Since Last Radiation: The patient completed radiation treatment to the lung on 10/18/2013   Narrative:  Recon Metastatic Colon cancer to lung/liver now to Right Femur   Expand All Collapse All  06/13/2015 - 06/15/2015  9:19 AM  PATIENT: [name] 60 y.o. female  PRE-OPERATIVE DIAGNOSIS: impending pathologic fracture right femur  POST-OPERATIVE DIAGNOSIS: impending pathologic fracture right femur  PROCEDURE: Procedure(s): INTRAMEDULLARY (IM) NAIL INTERTROCHANTERIC (Right)  SURGEON: Surgeon(s) and Role:  Mcarthur Rossetti, MD - Primary, Follow up 06/18/15      CT 06/12/15: IMPRESSION: Lytic lesion involving the inferior aspect of the right iliac bone as well as a lesion involving the midportion of the right femur most concerning for metastatic disease. Correlation with clinical exam and further evaluation with bone scan or PET-CT recommended.   Radiation Treatments:Lung Completed 10/06/13;-, 10/18/13 SBRT   Dr. Benay Spice seen 06/13/15: Folfox/Avastin completing cycle 5 om 05/22/15, for palliative radiation to right femur  Patient pain 8/10 level right leg, Wearing yellow fall risk band right wrist, in w/c and holding cane, has walker at home, D/C hospital yesterday CT today at 2pm   There were no vitals taken for this visit.  Wt Readings from Last 3 Encounters:  06/15/15 258 lb (117.028 kg)  06/13/15 258 lb 12.8 oz (117.391 kg)  05/22/15 263 lb 9.6 oz (119.568 kg)       ALLERGIES: has No Known Allergies.  Meds: Current Outpatient Prescriptions  Medication Sig Dispense Refill  . amLODipine (NORVASC) 10 MG tablet TAKE 1 TABLET BY MOUTH EVERY DAY (Patient taking differently: Take 1 tablet by mouth every day.) 90 tablet 0  . calcium-vitamin D (OSCAL WITH D) 500-200 MG-UNIT tablet Take 1 tablet by mouth daily with breakfast.    . hydrochlorothiazide (MICROZIDE) 12.5 MG capsule Take 1 capsule (12.5 mg total) by mouth daily. 30 capsule 1  . IRON PO Take 1 tablet by mouth daily.    Marland Kitchen lidocaine-prilocaine (EMLA) cream Apply topically as needed. Apply to [name] site 1-2 hours prior to stick and cover with plastic wrap to numb site 30 g 3  . Multiple Vitamins-Minerals (MULTIVITAMIN) tablet Take 1 tablet by mouth daily.    . potassium chloride SA (K-DUR,KLOR-CON) 20 MEQ tablet TAKE 1 TABLET BY MOUTH EVERY DAY 90 tablet 0  . XARELTO 20 MG TABS tablet TAKE 1 TABLET BY MOUTH EVERY DAY (Patient taking differently: Take 1 tablet by mouth every day.) 30 tablet 0  . diclofenac sodium (VOLTAREN) 1 % GEL Apply 2 g topically 4 (four) times daily. 100 g 3  . loperamide (IMODIUM) 2 MG capsule TAKE ONE CAPSULE BY MOUTH AS NEEDED FOR DIARRHEA OR LOOSE STOOLS. MAX OF 8 CAPSULES PER DAY (Patient not taking: Reported on 06/19/2015) 30 capsule 1  . LORazepam (ATIVAN) 0.5 MG tablet Place 1 tablet (0.5 mg total) under the tongue every 6 (six) hours as needed (for nausea). Causes drowsiness (Patient not taking: Reported on 06/19/2015) 30 tablet 0  . methocarbamol (ROBAXIN) 500 MG tablet Take 1 tablet (500 mg total) by mouth every 6 (six) hours as needed for muscle spasms. (Patient not taking: Reported on  06/19/2015) 60 tablet 0  . naproxen sodium (ANAPROX) 220 MG tablet Take 220 mg by mouth 2 (two) times daily as needed (leg pain).  Reported on 06/19/2015    . ondansetron (ZOFRAN) 8 MG tablet TAKE 1 TABLET BY MOUTH EVERY 8 HOURS AS NEEDED FOR NAUSEA AND VOMITING (Patient not taking: Reported on 06/12/2015) 20 tablet 2  . oxyCODONE-acetaminophen (PERCOCET/ROXICET) 5-325 MG tablet Take 1-2 tablets by mouth every 4 (four) hours as needed for severe pain. (Patient not taking: Reported on 06/19/2015) 60 tablet 0  . polyethylene glycol powder (MIRALAX) powder Take 17 g by mouth daily as needed. (Patient not taking: Reported on 06/19/2015) 255 g 0  . prochlorperazine (COMPAZINE) 10 MG tablet TAKE 1 TABLET BY MOUTH EVERY 6 HOURS AS NEEDED FOR NAUSEA OR VOMITING (Patient not taking: Reported on 05/08/2015) 30 tablet 1   No current facility-administered medications for this encounter.    Physical Findings: height is '5\' 11"'$  (1.803 m). Her oral temperature is 98.3 F (36.8 C). Her blood pressure is 143/78 and her pulse is 101. Her respiration is 20.     Lab Findings:  Labs (Brief)    Lab Results  Component Value Date   WBC 10.4 06/17/2015   WBC 7.4 06/13/2015   HGB 9.4* 06/17/2015   HGB 11.9 06/13/2015   HCT 29.0* 06/17/2015   HCT 36.6 06/13/2015   PLT 147* 06/17/2015   PLT 202 06/13/2015       Labs (Brief)    Lab Results  Component Value Date   NA 137 06/16/2015   NA 139 06/13/2015   K 5.0 06/16/2015   K 4.0 06/13/2015   CHLORIDE 101 06/13/2015   CO2 27 06/16/2015   CO2 26 06/13/2015   GLUCOSE 146* 06/16/2015   GLUCOSE 110 06/13/2015   GLUCOSE 93 10/19/2012   BUN 24* 06/16/2015   BUN 12.7 06/13/2015   CREATININE 1.01* 06/16/2015   CREATININE 1.1 06/13/2015   CREATININE 0.89 03/02/2012   BILITOT 1.0 06/14/2015   BILITOT 0.80 06/13/2015    ALKPHOS 142* 06/14/2015   ALKPHOS 187* 06/13/2015   AST 38 06/14/2015   AST 42* 06/13/2015   ALT 28 06/14/2015   ALT 33 06/13/2015   PROT 7.9 06/14/2015   PROT 8.9* 06/13/2015   ALBUMIN 4.0 06/14/2015   ALBUMIN 4.2 06/13/2015   CALCIUM 9.0 06/16/2015   CALCIUM 11.0* 06/13/2015   ANIONGAP 7 06/16/2015   ANIONGAP 12* 06/13/2015      Radiographic Findings:  Imaging Results    Ct Chest W Contrast  06/10/2015 CLINICAL DATA: Followup metastatic colon carcinoma to liver. Completed chemotherapy approximately 3 weeks ago. Restaging. EXAM: CT CHEST, ABDOMEN, AND PELVIS WITH CONTRAST TECHNIQUE: Multidetector CT imaging of the chest, abdomen and pelvis was performed following the standard protocol during bolus administration of intravenous contrast. CONTRAST: 151m OMNIPAQUE IOHEXOL 300 MG/ML SOLN COMPARISON: 03/19/2015 FINDINGS: CT CHEST FINDINGS Mediastinum/Lymph Nodes: Sub-cm left thyroid lobe nodule shows further decrease in size since previous study. No hilar or mediastinal lymphadenopathy identified. No other sites of lymphadenopathy identified within the thorax. Lungs/Pleura: Bilateral pulmonary metastases if decreased in size. Largest in the left upper lobe measures 4.1 x 3.1 cm on image 17/series 4 compared to 5.2 x 4.0 cm previously. Largest in the right middle lobe measures 3.2 x 3.4 cm on image 31/series 4 compared to 4.2 x 3.5 cm previously. No evidence of pleural effusion. Musculoskeletal: Lytic bone metastasis involving the T6 vertebral body is stable in appearance. No evidence pathologic fracture .  CT ABDOMEN PELVIS FINDINGS Hepatobiliary: Diffuse hepatic steatosis is again demonstrated. Postsurgical changes again seen in the posterior right hepatic lobe from previous partial hepatectomy. No definite liver masses identified. Prior cholecystectomy again noted. No evidence of  biliary ductal dilatation. Pancreas: No mass, inflammatory changes, or other significant abnormality. Spleen: Within normal limits in size and appearance. Adrenals/Urinary Tract: No masses identified. No evidence of hydronephrosis. Stomach/Bowel: No evidence of obstruction, inflammatory process, or abnormal fluid collections. Stable postop changes from previous colonic resections. Vascular/Lymphatic: No pathologically enlarged lymph nodes. No evidence of abdominal aortic aneurysm. Reproductive: Prior hysterectomy noted. Adnexal regions are unremarkable in appearance. Other: None. Musculoskeletal: Two lytic bone metastases involving the right ilium show mild increase in size since previous study. Index lesion on image 85/series 2 currently measures 1.4 x 2.0 cm compared to 0.8 x 0.9 cm previously. IMPRESSION: Decreased size of bilateral pulmonary metastases since prior study. Stable hepatic steatosis and postop changes from partial hepatectomy. No definite liver metastases. Mild increase in size of lytic bone metastases in the right ilium. Stable T6 vertebral body lytic metastasis. Electronically Signed By: Earle Gell M.D. On: 06/10/2015 11:30   Ct Abdomen Pelvis W Contrast  06/10/2015 CLINICAL DATA: Followup metastatic colon carcinoma to liver. Completed chemotherapy approximately 3 weeks ago. Restaging. EXAM: CT CHEST, ABDOMEN, AND PELVIS WITH CONTRAST TECHNIQUE: Multidetector CT imaging of the chest, abdomen and pelvis was performed following the standard protocol during bolus administration of intravenous contrast. CONTRAST: 173m OMNIPAQUE IOHEXOL 300 MG/ML SOLN COMPARISON: 03/19/2015 FINDINGS: CT CHEST FINDINGS Mediastinum/Lymph Nodes: Sub-cm left thyroid lobe nodule shows further decrease in size since previous study. No hilar or mediastinal lymphadenopathy identified. No other sites of lymphadenopathy identified within the thorax. Lungs/Pleura: Bilateral pulmonary metastases if decreased in size.  Largest in the left upper lobe measures 4.1 x 3.1 cm on image 17/series 4 compared to 5.2 x 4.0 cm previously. Largest in the right middle lobe measures 3.2 x 3.4 cm on image 31/series 4 compared to 4.2 x 3.5 cm previously. No evidence of pleural effusion. Musculoskeletal: Lytic bone metastasis involving the T6 vertebral body is stable in appearance. No evidence pathologic fracture . CT ABDOMEN PELVIS FINDINGS Hepatobiliary: Diffuse hepatic steatosis is again demonstrated. Postsurgical changes again seen in the posterior right hepatic lobe from previous partial hepatectomy. No definite liver masses identified. Prior cholecystectomy again noted. No evidence of biliary ductal dilatation. Pancreas: No mass, inflammatory changes, or other significant abnormality. Spleen: Within normal limits in size and appearance. Adrenals/Urinary Tract: No masses identified. No evidence of hydronephrosis. Stomach/Bowel: No evidence of obstruction, inflammatory process, or abnormal fluid collections. Stable postop changes from previous colonic resections. Vascular/Lymphatic: No pathologically enlarged lymph nodes. No evidence of abdominal aortic aneurysm. Reproductive: Prior hysterectomy noted. Adnexal regions are unremarkable in appearance. Other: None. Musculoskeletal: Two lytic bone metastases involving the right ilium show mild increase in size since previous study. Index lesion on image 85/series 2 currently measures 1.4 x 2.0 cm compared to 0.8 x 0.9 cm previously. IMPRESSION: Decreased size of bilateral pulmonary metastases since prior study. Stable hepatic steatosis and postop changes from partial hepatectomy. No definite liver metastases. Mild increase in size of lytic bone metastases in the right ilium. Stable T6 vertebral body lytic metastasis. Electronically Signed By: JEarle GellM.D. On: 06/10/2015 11:30   Ct Femur Right Wo Contrast  06/12/2015 CLINICAL DATA: 60year old female with right thigh and right leg  pain. No injury. EXAM: CT OF THE RIGHT FEMUR WITHOUT CONTRAST TECHNIQUE: Multidetector CT imaging was performed according  to the standard protocol. Multiplanar CT image reconstructions were also generated. COMPARISON: Radiograph dated 05/22/2015 FINDINGS: There is a partially visualized 4.3 x 2.3 cm lucent lesion in the inferior aspect of the right iliac bone superior to the right acetabulum. There is associated destruction of the cortex of the bone suggestive of aggressive nature of the lesion. A second lesion is noted in the midportion of the right femur measuring approximately 1.7 x 6.6 cm. There is partial erosive changes of the cortex of the bone adjacent to this lesion. Findings are concerning for metastatic disease. Other etiologies are not excluded. Further evaluation with bone scan or PET-CT is recommended to evaluate for possible additional lesions. There is no acute fracture or dislocation. The soft tissues appear unremarkable. IMPRESSION: Lytic lesion involving the inferior aspect of the right iliac bone as well as a lesion involving the midportion of the right femur most concerning for metastatic disease. Correlation with clinical exam and further evaluation with bone scan or PET-CT recommended. No acute fracture or dislocation. Electronically Signed By: Anner Crete M.D. On: 06/12/2015 21:30   Dg Knee Complete 4 Views Right  06/12/2015 CLINICAL DATA: 60 year old female with increasing right femoral pain and a history of metastatic colon cancer. EXAM: RIGHT KNEE - COMPLETE 4+ VIEW COMPARISON: Concurrently obtained radiographs of the right femur; Prior radiographs of the pelvis and right hip 05/22/2015 FINDINGS: There is no evidence of fracture, dislocation, or joint effusion. There is no evidence of arthropathy or other focal bone abnormality. Soft tissues are unremarkable. IMPRESSION: Negative. Electronically Signed By: Jacqulynn Cadet M.D. On: 06/12/2015 20:18   Dg C-arm 1-60  Min-no Report  06/15/2015 CLINICAL DATA: ORIF right hip C-ARM 1-60 MINUTES Fluoroscopy was utilized by the requesting physician. No radiographic interpretation.   Dg Hip Operative Unilat With Pelvis Right  06/15/2015 CLINICAL DATA: 60 year old female undergoing fixation of the right femur EXAM: DG C-ARM 1-60 MIN-NO REPORT; OPERATIVE RIGHT HIP WITH PELVIS COMPARISON: Prior hip radiographs 05/22/2015 FINDINGS: A total of 7 intraoperative spot radiographs demonstrates placement of an intra medullary nail with 2 distal interlocking screws and 2 proximal transfemoral neck cannulated lag screws. Irregular lytic lesion in the mid femoral diaphysis better seen on prior CT. No evidence of acute hardware complication. IMPRESSION: Placement of right femoral intra medullary nail with 2 distal interlocking screws and 2 transfemoral neck cannulated lag screws without evidence of acute complication. Irregular lytic lesion in the mid femoral diaphysis better seen on recent CT scan. Electronically Signed By: Jacqulynn Cadet M.D. On: 06/15/2015 09:21   Dg Hip Unilat With Pelvis 2-3 Views Right  05/22/2015 CLINICAL DATA: Pt complains of right hip and groin pain x 1 month with no injury. It is difficult for pt to bare weight and walks with a limp. Hx of colon ca with mets to the liver. EXAM: DG HIP (WITH OR WITHOUT PELVIS) 2-3V RIGHT COMPARISON: None. FINDINGS: Femoral heads are located. Degenerate sclerosis of both sacroiliac joints is mild. No acute fracture. No focal osseous lesion. Mild osteophyte formation about the right acetabulum. IMPRESSION: Degenerative change, without acute osseous finding. Electronically Signed By: Abigail Miyamoto M.D. On: 05/22/2015 15:34   Dg Femur, Min 2 Views Right  06/12/2015 CLINICAL DATA: Increasing right femur pain x 1 month with great intensity x 2 days with out injury - metastatic colon cancer - pain concentrated proximal femur radiating to right knee EXAM: RIGHT FEMUR 2  VIEWS COMPARISON: None. FINDINGS: No fracture. Knee joint normally aligned. Hip joint incompletely imaged. There is a subtle  area of irregular widening of the medullary cavity of the mid femur without a discrete lesion. Mild resorption of the end ostium is suggested. Soft tissues are unremarkable. IMPRESSION: 1. No fracture. 2. Questionable bone lesion in the mid femur. This could be further assessed with CT or MRI or bone scan. Electronically Signed By: Lajean Manes M.D. On: 06/12/2015 20:18     Impression:    ICD-9-CM ICD-10-CM   1. Bone metastasis (HCC) 198.5 C79.51    Stage IV colorectal cancer  The patient is appropriate to proceed with palliative XRT to the right femur postoperatively to the region of the lytic lesion seen in the mid-femur. The patient also has 2 mets in the right pelvis that also would benefit from XRT. We will treat these concurrently.  Plan: The patient will proceed with simulation today for treatment planning for the above areas. The patient will receive 30 Gy in 10 fractions.

## 2015-06-20 ENCOUNTER — Telehealth: Payer: Self-pay | Admitting: *Deleted

## 2015-06-20 ENCOUNTER — Encounter: Payer: Self-pay | Admitting: Radiation Oncology

## 2015-06-20 ENCOUNTER — Institutional Professional Consult (permissible substitution): Payer: Medicare Other | Admitting: Radiation Oncology

## 2015-06-20 DIAGNOSIS — C7951 Secondary malignant neoplasm of bone: Secondary | ICD-10-CM | POA: Insufficient documentation

## 2015-06-20 NOTE — Telephone Encounter (Signed)
Oncology Nurse Navigator Documentation  Oncology Nurse Navigator Flowsheets 06/20/2015  Navigator Location [facility]  Navigator Encounter Type Telephone  Telephone Outgoing Call;Patient Update--daughter reports pain is under control. She stays upstairs most of the time and she brings what she needs to her.   Patient Visit Type -  Treatment Phase -  Barriers/Navigation Needs -  Education -Encouraged daughter to remind [name] to keep her bowels moving w/fluids, laxative,softener prn.  Interventions -  Time Spent with Patient -  Time Spent with Patient (Retired) -  Provideded appointment with NP on 06/13/15 at 2:45.

## 2015-06-20 NOTE — Progress Notes (Addendum)
Patient Name Sex DOB SSN   Linda, [name] Female 21-Aug-1955 [id number]    Progress Notes by Lowry Bowl at 06/19/2015 1:32 PM    Author: Lowry Bowl Service: Radiation Oncology Author Type: (none)   Filed: 06/19/2015 2:08 PM Note Time: 06/19/2015 1:32 PM Status: Incomplete   Editor: Lowry Bowl     Expand All Collapse All    Radiation Oncology (336) [phone] ________________________________  Name: [name]: [id number] Date: 2/22/2017DOB: 05/27/1955  Follow-Up Visit Note  CC: No PCP Per Patient Ladell Pier, MD  Diagnosis: Metastatic colon cancer with pulmonary metastases now to right femur.    ICD-9-CM ICD-10-CM   1. Bone metastasis (HCC) 198.5 C79.51      Interval Since Last Radiation: The patient completed radiation treatment to the lung on 10/18/2013   Narrative:  Recon Metastatic Colon cancer to  lung/liver now to Right Femur   Expand All Collapse All   06/13/2015 - 06/15/2015  9:19 AM  PATIENT: [name] 60 y.o. female  PRE-OPERATIVE DIAGNOSIS: impending pathologic fracture right femur  POST-OPERATIVE DIAGNOSIS: impending pathologic fracture right femur  PROCEDURE: Procedure(s): INTRAMEDULLARY (IM) NAIL INTERTROCHANTERIC (Right)  SURGEON: Surgeon(s) and Role:  Mcarthur Rossetti, MD - Primary, Follow up 06/18/15      CT 06/12/15: IMPRESSION: Lytic lesion involving the inferior aspect of the right iliac bone as well as a lesion involving the midportion of the right femur most concerning for metastatic disease. Correlation with clinical exam and further evaluation with bone scan or PET-CT recommended.   Radiation Treatments:Lung  Completed 10/06/13;-,  10/18/13 SBRT   Dr. Benay Spice seen 06/13/15: Folfox/Avastin completing cycle 5 om 05/22/15, for palliative radiation to right femur  Patient pain 8/10 level right leg,  Wearing yellow fall risk band right wrist,  in w/c and holding cane, has walker at home, D/C hospital yesterday CT today at 2pm   There were no vitals taken for this visit.  Wt Readings from Last 3 Encounters:  06/15/15 258 lb (117.028 kg)  06/13/15 258 lb 12.8 oz (117.391 kg)  05/22/15 263 lb 9.6 oz (119.568 kg)     ALLERGIES: has No Known Allergies.  Meds: Current Outpatient Prescriptions  Medication Sig Dispense Refill  . amLODipine (NORVASC) 10 MG tablet TAKE 1 TABLET BY MOUTH EVERY DAY (Patient taking differently: Take 1 tablet by mouth every day.) 90 tablet 0  . calcium-vitamin D (OSCAL WITH D) 500-200 MG-UNIT tablet Take 1 tablet by mouth daily with breakfast.    . hydrochlorothiazide (MICROZIDE) 12.5 MG capsule Take 1 capsule (12.5 mg total) by mouth daily. 30 capsule 1  . IRON PO Take 1 tablet by mouth daily.    Marland Kitchen lidocaine-prilocaine (EMLA) cream Apply topically as needed. Apply to [facility] site 1-2 hours prior to stick and cover with plastic wrap to numb site 30 g 3  . Multiple Vitamins-Minerals (MULTIVITAMIN) tablet Take 1 tablet by mouth daily.    . potassium chloride SA (K-DUR,KLOR-CON) 20 MEQ tablet TAKE 1 TABLET BY MOUTH EVERY DAY 90 tablet 0  . XARELTO 20 MG TABS tablet TAKE 1 TABLET BY MOUTH EVERY DAY (Patient taking differently: Take 1 tablet by mouth every day.) 30 tablet 0  . diclofenac sodium (VOLTAREN) 1 % GEL Apply 2 g topically 4 (four) times daily. 100 g 3  . loperamide (IMODIUM) 2 MG capsule TAKE ONE CAPSULE BY MOUTH AS NEEDED FOR DIARRHEA OR LOOSE STOOLS. MAX OF 8 CAPSULES PER DAY (Patient not taking: Reported on 06/19/2015)  30 capsule 1  . LORazepam (ATIVAN) 0.5 MG tablet Place 1 tablet (0.5 mg total) under the tongue every 6 (six) hours as needed (for nausea). Causes drowsiness (Patient not taking: Reported on 06/19/2015) 30 tablet 0  . methocarbamol (ROBAXIN) 500 MG tablet Take 1 tablet (500 mg total) by mouth every 6  (six) hours as needed for muscle spasms. (Patient not taking: Reported on 06/19/2015) 60 tablet 0  . naproxen sodium (ANAPROX) 220 MG tablet Take 220 mg by mouth 2 (two) times daily as needed (leg pain). Reported on 06/19/2015    . ondansetron (ZOFRAN) 8 MG tablet TAKE 1 TABLET BY MOUTH EVERY 8 HOURS AS NEEDED FOR NAUSEA AND VOMITING (Patient not taking: Reported on 06/12/2015) 20 tablet 2  . oxyCODONE-acetaminophen (PERCOCET/ROXICET) 5-325 MG tablet Take 1-2 tablets by mouth every 4 (four) hours as needed for severe pain. (Patient not taking: Reported on 06/19/2015) 60 tablet 0  . polyethylene glycol powder (MIRALAX) powder Take 17 g by mouth daily as needed. (Patient not taking: Reported on 06/19/2015) 255 g 0  . prochlorperazine (COMPAZINE) 10 MG tablet TAKE 1 TABLET BY MOUTH EVERY 6 HOURS AS NEEDED FOR NAUSEA OR VOMITING (Patient not taking: Reported on 05/08/2015) 30 tablet 1   No current facility-administered medications for this encounter.    Physical Findings:  height is '5\' 11"'$  (1.803 m). Her oral temperature is 98.3 F (36.8 C). Her blood pressure is 143/78 and her pulse is 101. Her respiration is 20.      Lab Findings:  Labs (Brief)    Lab Results  Component Value Date   WBC 10.4 06/17/2015   WBC 7.4 06/13/2015   HGB 9.4* 06/17/2015   HGB 11.9 06/13/2015   HCT 29.0* 06/17/2015   HCT 36.6 06/13/2015   PLT 147* 06/17/2015   PLT 202 06/13/2015       Labs (Brief)    Lab Results  Component Value Date   NA 137 06/16/2015   NA 139 06/13/2015   K 5.0 06/16/2015   K 4.0 06/13/2015   CHLORIDE 101 06/13/2015   CO2 27 06/16/2015   CO2 26 06/13/2015   GLUCOSE 146* 06/16/2015   GLUCOSE 110 06/13/2015   GLUCOSE 93 10/19/2012   BUN 24* 06/16/2015   BUN 12.7 06/13/2015   CREATININE 1.01* 06/16/2015   CREATININE 1.1 06/13/2015   CREATININE 0.89  03/02/2012   BILITOT 1.0 06/14/2015   BILITOT 0.80 06/13/2015   ALKPHOS 142* 06/14/2015   ALKPHOS 187* 06/13/2015   AST 38 06/14/2015   AST 42* 06/13/2015   ALT 28 06/14/2015   ALT 33 06/13/2015   PROT 7.9 06/14/2015   PROT 8.9* 06/13/2015   ALBUMIN 4.0 06/14/2015   ALBUMIN 4.2 06/13/2015   CALCIUM 9.0 06/16/2015   CALCIUM 11.0* 06/13/2015   ANIONGAP 7 06/16/2015   ANIONGAP 12* 06/13/2015      Radiographic Findings:  Imaging Results    Ct Chest W Contrast  06/10/2015 CLINICAL DATA: Followup metastatic colon carcinoma to liver. Completed chemotherapy approximately 3 weeks ago. Restaging. EXAM: CT CHEST, ABDOMEN, AND PELVIS WITH CONTRAST TECHNIQUE: Multidetector CT imaging of the chest, abdomen and pelvis was performed following the standard protocol during bolus administration of intravenous contrast. CONTRAST: 126m OMNIPAQUE IOHEXOL 300 MG/ML SOLN COMPARISON: 03/19/2015 FINDINGS: CT CHEST FINDINGS Mediastinum/Lymph Nodes: Sub-cm left thyroid lobe nodule shows further decrease in size since previous study. No hilar or mediastinal lymphadenopathy identified. No other sites of lymphadenopathy identified within the thorax. Lungs/Pleura: Bilateral pulmonary metastases if decreased  in size. Largest in the left upper lobe measures 4.1 x 3.1 cm on image 17/series 4 compared to 5.2 x 4.0 cm previously. Largest in the right middle lobe measures 3.2 x 3.4 cm on image 31/series 4 compared to 4.2 x 3.5 cm previously. No evidence of pleural effusion. Musculoskeletal: Lytic bone metastasis involving the T6 vertebral body is stable in appearance. No evidence pathologic fracture . CT ABDOMEN PELVIS FINDINGS Hepatobiliary: Diffuse hepatic steatosis is again demonstrated. Postsurgical changes again seen in the posterior right hepatic lobe from previous partial hepatectomy. No definite liver masses identified. Prior cholecystectomy again  noted. No evidence of biliary ductal dilatation. Pancreas: No mass, inflammatory changes, or other significant abnormality. Spleen: Within normal limits in size and appearance. Adrenals/Urinary Tract: No masses identified. No evidence of hydronephrosis. Stomach/Bowel: No evidence of obstruction, inflammatory process, or abnormal fluid collections. Stable postop changes from previous colonic resections. Vascular/Lymphatic: No pathologically enlarged lymph nodes. No evidence of abdominal aortic aneurysm. Reproductive: Prior hysterectomy noted. Adnexal regions are unremarkable in appearance. Other: None. Musculoskeletal: Two lytic bone metastases involving the right ilium show mild increase in size since previous study. Index lesion on image 85/series 2 currently measures 1.4 x 2.0 cm compared to 0.8 x 0.9 cm previously. IMPRESSION: Decreased size of bilateral pulmonary metastases since prior study. Stable hepatic steatosis and postop changes from partial hepatectomy. No definite liver metastases. Mild increase in size of lytic bone metastases in the right ilium. Stable T6 vertebral body lytic metastasis. Electronically Signed By: Earle Gell M.D. On: 06/10/2015 11:30   Ct Abdomen Pelvis W Contrast  06/10/2015 CLINICAL DATA: Followup metastatic colon carcinoma to liver. Completed chemotherapy approximately 3 weeks ago. Restaging. EXAM: CT CHEST, ABDOMEN, AND PELVIS WITH CONTRAST TECHNIQUE: Multidetector CT imaging of the chest, abdomen and pelvis was performed following the standard protocol during bolus administration of intravenous contrast. CONTRAST: 164m OMNIPAQUE IOHEXOL 300 MG/ML SOLN COMPARISON: 03/19/2015 FINDINGS: CT CHEST FINDINGS Mediastinum/Lymph Nodes: Sub-cm left thyroid lobe nodule shows further decrease in size since previous study. No hilar or mediastinal lymphadenopathy identified. No other sites of lymphadenopathy identified within the thorax. Lungs/Pleura: Bilateral pulmonary metastases  if decreased in size. Largest in the left upper lobe measures 4.1 x 3.1 cm on image 17/series 4 compared to 5.2 x 4.0 cm previously. Largest in the right middle lobe measures 3.2 x 3.4 cm on image 31/series 4 compared to 4.2 x 3.5 cm previously. No evidence of pleural effusion. Musculoskeletal: Lytic bone metastasis involving the T6 vertebral body is stable in appearance. No evidence pathologic fracture . CT ABDOMEN PELVIS FINDINGS Hepatobiliary: Diffuse hepatic steatosis is again demonstrated. Postsurgical changes again seen in the posterior right hepatic lobe from previous partial hepatectomy. No definite liver masses identified. Prior cholecystectomy again noted. No evidence of biliary ductal dilatation. Pancreas: No mass, inflammatory changes, or other significant abnormality. Spleen: Within normal limits in size and appearance. Adrenals/Urinary Tract: No masses identified. No evidence of hydronephrosis. Stomach/Bowel: No evidence of obstruction, inflammatory process, or abnormal fluid collections. Stable postop changes from previous colonic resections. Vascular/Lymphatic: No pathologically enlarged lymph nodes. No evidence of abdominal aortic aneurysm. Reproductive: Prior hysterectomy noted. Adnexal regions are unremarkable in appearance. Other: None. Musculoskeletal: Two lytic bone metastases involving the right ilium show mild increase in size since previous study. Index lesion on image 85/series 2 currently measures 1.4 x 2.0 cm compared to 0.8 x 0.9 cm previously. IMPRESSION: Decreased size of bilateral pulmonary metastases since prior study. Stable hepatic steatosis and postop changes from partial hepatectomy.  No definite liver metastases. Mild increase in size of lytic bone metastases in the right ilium. Stable T6 vertebral body lytic metastasis. Electronically Signed By: Earle Gell M.D. On: 06/10/2015 11:30   Ct Femur Right Wo Contrast  06/12/2015 CLINICAL DATA: 60 year old female with right  thigh and right leg pain. No injury. EXAM: CT OF THE RIGHT FEMUR WITHOUT CONTRAST TECHNIQUE: Multidetector CT imaging was performed according to the standard protocol. Multiplanar CT image reconstructions were also generated. COMPARISON: Radiograph dated 05/22/2015 FINDINGS: There is a partially visualized 4.3 x 2.3 cm lucent lesion in the inferior aspect of the right iliac bone superior to the right acetabulum. There is associated destruction of the cortex of the bone suggestive of aggressive nature of the lesion. A second lesion is noted in the midportion of the right femur measuring approximately 1.7 x 6.6 cm. There is partial erosive changes of the cortex of the bone adjacent to this lesion. Findings are concerning for metastatic disease. Other etiologies are not excluded. Further evaluation with bone scan or PET-CT is recommended to evaluate for possible additional lesions. There is no acute fracture or dislocation. The soft tissues appear unremarkable. IMPRESSION: Lytic lesion involving the inferior aspect of the right iliac bone as well as a lesion involving the midportion of the right femur most concerning for metastatic disease. Correlation with clinical exam and further evaluation with bone scan or PET-CT recommended. No acute fracture or dislocation. Electronically Signed By: Anner Crete M.D. On: 06/12/2015 21:30   Dg Knee Complete 4 Views Right  06/12/2015 CLINICAL DATA: 60 year old female with increasing right femoral pain and a history of metastatic colon cancer. EXAM: RIGHT KNEE - COMPLETE 4+ VIEW COMPARISON: Concurrently obtained radiographs of the right femur; Prior radiographs of the pelvis and right hip 05/22/2015 FINDINGS: There is no evidence of fracture, dislocation, or joint effusion. There is no evidence of arthropathy or other focal bone abnormality. Soft tissues are unremarkable. IMPRESSION: Negative. Electronically Signed By: Jacqulynn Cadet M.D. On: 06/12/2015 20:18     Dg C-arm 1-60 Min-no Report  06/15/2015 CLINICAL DATA: ORIF right hip C-ARM 1-60 MINUTES Fluoroscopy was utilized by the requesting physician. No radiographic interpretation.   Dg Hip Operative Unilat With Pelvis Right  06/15/2015 CLINICAL DATA: 59 year old female undergoing fixation of the right femur EXAM: DG C-ARM 1-60 MIN-NO REPORT; OPERATIVE RIGHT HIP WITH PELVIS COMPARISON: Prior hip radiographs 05/22/2015 FINDINGS: A total of 7 intraoperative spot radiographs demonstrates placement of an intra medullary nail with 2 distal interlocking screws and 2 proximal transfemoral neck cannulated lag screws. Irregular lytic lesion in the mid femoral diaphysis better seen on prior CT. No evidence of acute hardware complication. IMPRESSION: Placement of right femoral intra medullary nail with 2 distal interlocking screws and 2 transfemoral neck cannulated lag screws without evidence of acute complication. Irregular lytic lesion in the mid femoral diaphysis better seen on recent CT scan. Electronically Signed By: Jacqulynn Cadet M.D. On: 06/15/2015 09:21   Dg Hip Unilat With Pelvis 2-3 Views Right  05/22/2015 CLINICAL DATA: Pt complains of right hip and groin pain x 1 month with no injury. It is difficult for pt to bare weight and walks with a limp. Hx of colon ca with mets to the liver. EXAM: DG HIP (WITH OR WITHOUT PELVIS) 2-3V RIGHT COMPARISON: None. FINDINGS: Femoral heads are located. Degenerate sclerosis of both sacroiliac joints is mild. No acute fracture. No focal osseous lesion. Mild osteophyte formation about the right acetabulum. IMPRESSION: Degenerative change, without acute osseous finding. Electronically Signed By:  Abigail Miyamoto M.D. On: 05/22/2015 15:34   Dg Femur, Min 2 Views Right  06/12/2015 CLINICAL DATA: Increasing right femur pain x 1 month with great intensity x 2 days with out injury - metastatic colon cancer - pain concentrated proximal femur radiating to right knee  EXAM: RIGHT FEMUR 2 VIEWS COMPARISON: None. FINDINGS: No fracture. Knee joint normally aligned. Hip joint incompletely imaged. There is a subtle area of irregular widening of the medullary cavity of the mid femur without a discrete lesion. Mild resorption of the end ostium is suggested. Soft tissues are unremarkable. IMPRESSION: 1. No fracture. 2. Questionable bone lesion in the mid femur. This could be further assessed with CT or MRI or bone scan. Electronically Signed By: Lajean Manes M.D. On: 06/12/2015 20:18     Impression:  No matching staging information was found for the patient. The patient is appropriate to proceed with palliative XRT to the right femur postoperatively to the region of the lytic lesion seen in the mid-femur. The patient also has 2 mets in the right pelvis that also would benefit from XRT. We will treat these concurrently.  Plan: The patient will proceed with simulation today for treatment planning for the above areas. The patient will receive 30 Gy in 10 fractions.

## 2015-06-21 DIAGNOSIS — Z510 Encounter for antineoplastic radiation therapy: Secondary | ICD-10-CM | POA: Diagnosis not present

## 2015-06-24 ENCOUNTER — Ambulatory Visit: Payer: Medicare Other | Admitting: Radiation Oncology

## 2015-06-24 ENCOUNTER — Encounter (HOSPITAL_COMMUNITY): Payer: Self-pay | Admitting: Orthopaedic Surgery

## 2015-06-24 ENCOUNTER — Other Ambulatory Visit: Payer: Self-pay | Admitting: Oncology

## 2015-06-24 ENCOUNTER — Encounter: Payer: Self-pay | Admitting: *Deleted

## 2015-06-24 NOTE — Progress Notes (Signed)
[place] Work  Clinical Social Work was referred by need for CSW follow up for assessment of psychosocial needs.  Clinical Social Worker contacted patient at home to offer support and assess for needs.  Pt reports she is doing well after her surgery and denies needs. Pt was not interested in [facility] at time of discharge, but states she is managing well at home with asst from daughter. Pt reports to plan to use [facility] for upcoming appts and denies need for [facility] pass currently. Pt agrees to reach out if she has concerns or needs.   Clinical Social Work interventions: Supportive listening Resource education  [name], [place] Worker [place]  [place] Phone: [phone] Fax: [phone]

## 2015-06-25 ENCOUNTER — Encounter (HOSPITAL_COMMUNITY): Payer: Self-pay | Admitting: Orthopaedic Surgery

## 2015-06-26 ENCOUNTER — Encounter (HOSPITAL_COMMUNITY): Payer: Self-pay | Admitting: Orthopaedic Surgery

## 2015-06-26 NOTE — Addendum Note (Signed)
Encounter addended by: Kyung Rudd, MD on: 06/26/2015  6:27 PM<BR>     Documentation filed: Notes Section

## 2015-06-27 ENCOUNTER — Encounter (HOSPITAL_COMMUNITY): Payer: Self-pay | Admitting: Orthopaedic Surgery

## 2015-07-03 ENCOUNTER — Ambulatory Visit
Admission: RE | Admit: 2015-07-03 | Discharge: 2015-07-03 | Disposition: A | Discharge status: Home / Self Care | Payer: Medicare Other | Source: Ambulatory Visit | Attending: Radiation Oncology | Admitting: Radiation Oncology

## 2015-07-03 ENCOUNTER — Ambulatory Visit: Admission: RE | Admit: 2015-07-03 | Payer: Medicare Other | Source: Ambulatory Visit | Admitting: Radiation Oncology

## 2015-07-03 DIAGNOSIS — C189 Malignant neoplasm of colon, unspecified: Secondary | ICD-10-CM | POA: Insufficient documentation

## 2015-07-03 DIAGNOSIS — C7951 Secondary malignant neoplasm of bone: Secondary | ICD-10-CM | POA: Insufficient documentation

## 2015-07-03 DIAGNOSIS — Z510 Encounter for antineoplastic radiation therapy: Secondary | ICD-10-CM | POA: Insufficient documentation

## 2015-07-03 MED FILL — OXYCODONE/APAP 5/325MG: 5-325 | 10 days supply | Qty: 60 | Fill #0

## 2015-07-04 ENCOUNTER — Ambulatory Visit
Admission: RE | Admit: 2015-07-04 | Discharge: 2015-07-04 | Disposition: A | Discharge status: Home / Self Care | Payer: Medicare Other | Source: Ambulatory Visit | Attending: Radiation Oncology | Admitting: Radiation Oncology

## 2015-07-04 DIAGNOSIS — C7951 Secondary malignant neoplasm of bone: Secondary | ICD-10-CM | POA: Diagnosis present

## 2015-07-04 DIAGNOSIS — Z510 Encounter for antineoplastic radiation therapy: Secondary | ICD-10-CM | POA: Diagnosis not present

## 2015-07-04 DIAGNOSIS — C189 Malignant neoplasm of colon, unspecified: Secondary | ICD-10-CM | POA: Diagnosis not present

## 2015-07-05 ENCOUNTER — Encounter: Payer: Self-pay | Admitting: Radiation Oncology

## 2015-07-05 ENCOUNTER — Ambulatory Visit
Admission: RE | Admit: 2015-07-05 | Discharge: 2015-07-05 | Disposition: A | Discharge status: Home / Self Care | Payer: Medicare Other | Source: Ambulatory Visit | Attending: Radiation Oncology | Admitting: Radiation Oncology

## 2015-07-05 VITALS — BP 149/76 | HR 109 | Temp 98.0°F | Resp 20 | Wt 258.6 lb

## 2015-07-05 DIAGNOSIS — C7951 Secondary malignant neoplasm of bone: Secondary | ICD-10-CM

## 2015-07-05 DIAGNOSIS — Z510 Encounter for antineoplastic radiation therapy: Secondary | ICD-10-CM | POA: Diagnosis not present

## 2015-07-05 NOTE — Progress Notes (Signed)
  Radiation Oncology         [phone] ________________________________  Name: Jenefer Woerner MRN: [id number]  Date: 06/19/2015  DOB: Jun 13, 1955  SIMULATION AND TREATMENT PLANNING NOTE  DIAGNOSIS:     ICD-9-CM ICD-10-CM   1. Bone metastasis (HCC) 198.5 C79.51      Site:   1.  Right femur 2.  Right pelvis  NARRATIVE:  The patient was brought to the [facility].  Identity was confirmed.  All relevant records and images related to the planned course of therapy were reviewed.   Written consent to proceed with treatment was confirmed which was freely given after reviewing the details related to the planned course of therapy had been reviewed with the patient.  Then, the patient was set-up in a stable reproducible  supine position for radiation therapy.  CT images were obtained.  Surface markings were placed.    Medically necessary complex treatment device(s) for immobilization:  Customized Vac lock bag.   The CT images were loaded into the planning software.  Then the target and avoidance structures were contoured.  Treatment planning then occurred.  The radiation prescription was entered and confirmed.  A total of 5 complex treatment devices were fabricated which relate to the designed radiation treatment fields:  2 fields to treat the right femur and a 3 field technique to treat the right pelvis. Each of these customized fields/ complex treatment devices will be used on a daily basis during the radiation course. I have requested : Isodose Plan.   PLAN:  The patient will receive 30 Gy in 10 fractions to each of these 2 separate target regions.  ________________________________   Jodelle Gross, MD, PhD

## 2015-07-05 NOTE — Addendum Note (Signed)
Encounter addended by: Kyung Rudd, MD on: 07/05/2015  3:13 PM<BR>     Documentation filed: Notes Section

## 2015-07-05 NOTE — Progress Notes (Signed)
Weekly rad txs right pelvis and rt femur 2/10  Completed, no c/o pain, no skin irritation,  Appetite good, ambulates with a walker states energy level good 2:53 PM BP 149/76 mmHg  Pulse 109  Temp(Src) 98 F (36.7 C) (Oral)  Resp 20  Wt 258 lb 9.6 oz (117.3 kg)  Wt Readings from Last 3 Encounters:  07/05/15 258 lb 9.6 oz (117.3 kg)  06/15/15 258 lb (117.028 kg)  06/13/15 258 lb 12.8 oz (117.391 kg)

## 2015-07-05 NOTE — Progress Notes (Signed)
Department of Radiation Oncology  Phone:  [phone] Fax:        [phone]  Weekly Treatment Note    Name: [name] Date: 07/05/2015 MRN: [id number] DOB: 11-23-1955   Diagnosis:     ICD-9-CM ICD-10-CM   1. Bone metastasis (HCC) 198.5 C79.51      Current dose: 6 Gy  Current fraction: 2   MEDICATIONS: Current Outpatient Prescriptions  Medication Sig Dispense Refill  . amLODipine (NORVASC) 10 MG tablet TAKE 1 TABLET BY MOUTH EVERY DAY (Patient taking differently: Take 1 tablet by mouth every day.) 90 tablet 0  . calcium-vitamin D (OSCAL WITH D) 500-200 MG-UNIT tablet Take 1 tablet by mouth daily with breakfast.    . diclofenac sodium (VOLTAREN) 1 % GEL Apply 2 g topically 4 (four) times daily. 100 g 3  . hydrochlorothiazide (MICROZIDE) 12.5 MG capsule Take 1 capsule (12.5 mg total) by mouth daily. 30 capsule 1  . IRON PO Take 1 tablet by mouth daily.    Marland Kitchen lidocaine-prilocaine (EMLA) cream Apply topically as needed. Apply to [facility] site 1-2 hours prior to stick and cover with plastic wrap to numb site 30 g 3  . loperamide (IMODIUM) 2 MG capsule TAKE ONE CAPSULE BY MOUTH AS NEEDED FOR DIARRHEA OR LOOSE STOOLS. MAX OF 8 CAPSULES PER DAY (Patient not taking: Reported on 06/19/2015) 30 capsule 1  . LORazepam (ATIVAN) 0.5 MG tablet Place 1 tablet (0.5 mg total) under the tongue every 6 (six) hours as needed (for nausea). Causes drowsiness (Patient not taking: Reported on 06/19/2015) 30 tablet 0  . methocarbamol (ROBAXIN) 500 MG tablet Take 1 tablet (500 mg total) by mouth every 6 (six) hours as needed for muscle spasms. (Patient not taking: Reported on 06/19/2015) 60 tablet 0  . Multiple Vitamins-Minerals (MULTIVITAMIN) tablet Take 1 tablet by mouth daily.    . naproxen sodium (ANAPROX) 220 MG tablet Take 220 mg by mouth 2 (two) times daily as needed (leg pain). Reported on 06/19/2015    . ondansetron (ZOFRAN) 8 MG tablet TAKE 1 TABLET BY MOUTH EVERY 8 HOURS AS NEEDED FOR NAUSEA AND  VOMITING (Patient not taking: Reported on 06/12/2015) 20 tablet 2  . oxyCODONE-acetaminophen (PERCOCET/ROXICET) 5-325 MG tablet Take 1-2 tablets by mouth every 4 (four) hours as needed for severe pain. (Patient not taking: Reported on 06/19/2015) 60 tablet 0  . polyethylene glycol powder (MIRALAX) powder Take 17 g by mouth daily as needed. (Patient not taking: Reported on 06/19/2015) 255 g 0  . potassium chloride SA (K-DUR,KLOR-CON) 20 MEQ tablet TAKE 1 TABLET BY MOUTH EVERY DAY 90 tablet 0  . prochlorperazine (COMPAZINE) 10 MG tablet TAKE 1 TABLET BY MOUTH EVERY 6 HOURS AS NEEDED FOR NAUSEA OR VOMITING (Patient not taking: Reported on 05/08/2015) 30 tablet 1  . XARELTO 20 MG TABS tablet TAKE 1 TABLET BY MOUTH EVERY DAY 30 tablet 1   No current facility-administered medications for this encounter.     ALLERGIES: Review of patient's allergies indicates no known allergies.   LABORATORY DATA:  Lab Results  Component Value Date   WBC 10.4 06/17/2015   HGB 9.4* 06/17/2015   HCT 29.0* 06/17/2015   MCV 91.5 06/17/2015   PLT 147* 06/17/2015   Lab Results  Component Value Date   NA 137 06/16/2015   K 5.0 06/16/2015   CL 103 06/16/2015   CO2 27 06/16/2015   Lab Results  Component Value Date   ALT 28 06/14/2015   AST 38 06/14/2015  ALKPHOS 142* 06/14/2015   BILITOT 1.0 06/14/2015     NARRATIVE: Langley Flatley was seen today for weekly treatment management. The chart was checked and the patient's films were reviewed.  Weekly rad txs right pelvis and rt femur 2/10  Completed, no c/o pain, no skin irritation,  Appetite good, ambulates with a walker states energy level good 3:05 PM BP 149/76 mmHg  Pulse 109  Temp(Src) 98 F (36.7 C) (Oral)  Resp 20  Wt 258 lb 9.6 oz (117.3 kg)  Wt Readings from Last 3 Encounters:  07/05/15 258 lb 9.6 oz (117.3 kg)  06/15/15 258 lb (117.028 kg)  06/13/15 258 lb 12.8 oz (117.391 kg)    PHYSICAL EXAMINATION: weight is 258 lb 9.6 oz (117.3 kg). Her  oral temperature is 98 F (36.7 C). Her blood pressure is 149/76 and her pulse is 109. Her respiration is 20.        ASSESSMENT: The patient is doing satisfactorily with treatment.  PLAN: We will continue with the patient's radiation treatment as planned. The patient is doing very well area is no difficulties.

## 2015-07-08 ENCOUNTER — Ambulatory Visit
Admission: RE | Admit: 2015-07-08 | Discharge: 2015-07-08 | Disposition: A | Discharge status: Home / Self Care | Payer: Medicare Other | Source: Ambulatory Visit | Attending: Radiation Oncology | Admitting: Radiation Oncology

## 2015-07-08 DIAGNOSIS — Z510 Encounter for antineoplastic radiation therapy: Secondary | ICD-10-CM | POA: Diagnosis not present

## 2015-07-09 ENCOUNTER — Ambulatory Visit: Payer: Medicare Other

## 2015-07-09 ENCOUNTER — Ambulatory Visit
Admission: RE | Admit: 2015-07-09 | Discharge: 2015-07-09 | Disposition: A | Discharge status: Home / Self Care | Payer: Medicare Other | Source: Ambulatory Visit | Attending: Radiation Oncology | Admitting: Radiation Oncology

## 2015-07-10 ENCOUNTER — Ambulatory Visit
Admission: RE | Admit: 2015-07-10 | Discharge: 2015-07-10 | Disposition: A | Discharge status: Home / Self Care | Payer: Medicare Other | Source: Ambulatory Visit | Attending: Radiation Oncology | Admitting: Radiation Oncology

## 2015-07-10 DIAGNOSIS — Z510 Encounter for antineoplastic radiation therapy: Secondary | ICD-10-CM | POA: Diagnosis not present

## 2015-07-11 ENCOUNTER — Ambulatory Visit: Payer: Medicare Other | Admitting: Nurse Practitioner

## 2015-07-11 ENCOUNTER — Ambulatory Visit
Admission: RE | Admit: 2015-07-11 | Discharge: 2015-07-11 | Disposition: A | Discharge status: Home / Self Care | Payer: Medicare Other | Source: Ambulatory Visit | Attending: Radiation Oncology | Admitting: Radiation Oncology

## 2015-07-11 DIAGNOSIS — Z510 Encounter for antineoplastic radiation therapy: Secondary | ICD-10-CM | POA: Diagnosis not present

## 2015-07-12 ENCOUNTER — Ambulatory Visit
Admission: RE | Admit: 2015-07-12 | Discharge: 2015-07-12 | Disposition: A | Discharge status: Home / Self Care | Payer: Medicare Other | Source: Ambulatory Visit | Attending: Radiation Oncology | Admitting: Radiation Oncology

## 2015-07-12 ENCOUNTER — Encounter: Payer: Self-pay | Admitting: Radiation Oncology

## 2015-07-12 VITALS — BP 135/83 | HR 89 | Temp 98.3°F | Resp 20 | Wt 259.4 lb

## 2015-07-12 DIAGNOSIS — Z510 Encounter for antineoplastic radiation therapy: Secondary | ICD-10-CM | POA: Diagnosis not present

## 2015-07-12 DIAGNOSIS — C7951 Secondary malignant neoplasm of bone: Secondary | ICD-10-CM

## 2015-07-12 MED ORDER — OXYCODONE-ACETAMINOPHEN 5-325 MG PO TABS: 1.0000 | ORAL_TABLET | ORAL | Status: DC | PRN

## 2015-07-12 NOTE — Progress Notes (Signed)
Department of Radiation Oncology  Phone:  [phone] Fax:        [phone]  Weekly Treatment Note    Name: [name] Date: 07/12/2015 MRN: [id number] DOB: 15-Mar-1956   Diagnosis:     ICD-9-CM ICD-10-CM   1. Bone metastasis (HCC) 198.5 C79.51      Current dose: 18 Gy  Current fraction: 6   MEDICATIONS: Current Outpatient Prescriptions  Medication Sig Dispense Refill  . amLODipine (NORVASC) 10 MG tablet TAKE 1 TABLET BY MOUTH EVERY DAY (Patient taking differently: Take 1 tablet by mouth every day.) 90 tablet 0  . calcium-vitamin D (OSCAL WITH D) 500-200 MG-UNIT tablet Take 1 tablet by mouth daily with breakfast.    . diclofenac sodium (VOLTAREN) 1 % GEL Apply 2 g topically 4 (four) times daily. 100 g 3  . hydrochlorothiazide (MICROZIDE) 12.5 MG capsule Take 1 capsule (12.5 mg total) by mouth daily. 30 capsule 1  . IRON PO Take 1 tablet by mouth daily.    Marland Kitchen lidocaine-prilocaine (EMLA) cream Apply topically as needed. Apply to [facility] site 1-2 hours prior to stick and cover with plastic wrap to numb site 30 g 3  . loperamide (IMODIUM) 2 MG capsule TAKE ONE CAPSULE BY MOUTH AS NEEDED FOR DIARRHEA OR LOOSE STOOLS. MAX OF 8 CAPSULES PER DAY 30 capsule 1  . LORazepam (ATIVAN) 0.5 MG tablet Place 1 tablet (0.5 mg total) under the tongue every 6 (six) hours as needed (for nausea). Causes drowsiness 30 tablet 0  . Multiple Vitamins-Minerals (MULTIVITAMIN) tablet Take 1 tablet by mouth daily.    . naproxen sodium (ANAPROX) 220 MG tablet Take 220 mg by mouth 2 (two) times daily as needed (leg pain). Reported on 06/19/2015    . ondansetron (ZOFRAN) 8 MG tablet TAKE 1 TABLET BY MOUTH EVERY 8 HOURS AS NEEDED FOR NAUSEA AND VOMITING 20 tablet 2  . oxyCODONE-acetaminophen (PERCOCET/ROXICET) 5-325 MG tablet Take 1-2 tablets by mouth every 4 (four) hours as needed for severe pain. 60 tablet 0  . polyethylene glycol powder (MIRALAX) powder Take 17 g by mouth daily as needed. 255 g 0  .  potassium chloride SA (K-DUR,KLOR-CON) 20 MEQ tablet TAKE 1 TABLET BY MOUTH EVERY DAY 90 tablet 0  . prochlorperazine (COMPAZINE) 10 MG tablet TAKE 1 TABLET BY MOUTH EVERY 6 HOURS AS NEEDED FOR NAUSEA OR VOMITING 30 tablet 1  . tiZANidine (ZANAFLEX) 4 MG tablet Take 4 mg by mouth 2 (two) times daily.  0  . XARELTO 20 MG TABS tablet TAKE 1 TABLET BY MOUTH EVERY DAY 30 tablet 1  . methocarbamol (ROBAXIN) 500 MG tablet Take 1 tablet (500 mg total) by mouth every 6 (six) hours as needed for muscle spasms. (Patient not taking: Reported on 07/12/2015) 60 tablet 0   No current facility-administered medications for this encounter.     ALLERGIES: Review of patient's allergies indicates no known allergies.   LABORATORY DATA:  Lab Results  Component Value Date   WBC 10.4 06/17/2015   HGB 9.4* 06/17/2015   HCT 29.0* 06/17/2015   MCV 91.5 06/17/2015   PLT 147* 06/17/2015   Lab Results  Component Value Date   NA 137 06/16/2015   K 5.0 06/16/2015   CL 103 06/16/2015   CO2 27 06/16/2015   Lab Results  Component Value Date   ALT 28 06/14/2015   AST 38 06/14/2015   ALKPHOS 142* 06/14/2015   BILITOT 1.0 06/14/2015     NARRATIVE: [name] was seen today  for weekly treatment management. The chart was checked and the patient's films were reviewed.  Weekly rad txs 6/10 right femur and pelvis, pain 8/10  Right leg,especailly at night, tosses and turns, requesting refill on percocet 5/'325mg'$ , no skin breakldown, appetite good, using walker, fatigue mild 2:08 PM BP 135/83 mmHg  Pulse 89  Temp(Src) 98.3 F (36.8 C) (Oral)  Resp 20  Wt 259 lb 6.4 oz (117.663 kg)  Wt Readings from Last 3 Encounters:  07/12/15 259 lb 6.4 oz (117.663 kg)  07/05/15 258 lb 9.6 oz (117.3 kg)  06/15/15 258 lb (117.028 kg)    PHYSICAL EXAMINATION: weight is 259 lb 6.4 oz (117.663 kg). Her oral temperature is 98.3 F (36.8 C). Her blood pressure is 135/83 and her pulse is 89. Her respiration is 20.         ASSESSMENT: The patient is doing satisfactorily with treatment.  PLAN: We will continue with the patient's radiation treatment as planned. The patient is doing satisfactorily overall. She was given a refill on her pain medications. The patient does have a lesion at T6 level within the thoracic spine. This was stable on her last imaging. The patient currently denies any back pain. We discussed that if she develops any certainly this could be targeted with additional palliative radiation treatment in the interim. No plans for this currently.

## 2015-07-12 NOTE — Progress Notes (Signed)
Weekly rad txs 6/10 right femur and pelvis, pain 8/10  Right leg,especailly at night, tosses and turns, requesting refill on percocet 5/'325mg'$ , no skin breakldown, appetite good, using walker, fatigue mild 1:55 PM BP 135/83 mmHg  Pulse 89  Temp(Src) 98.3 F (36.8 C) (Oral)  Resp 20  Wt 259 lb 6.4 oz (117.663 kg)  Wt Readings from Last 3 Encounters:  07/12/15 259 lb 6.4 oz (117.663 kg)  07/05/15 258 lb 9.6 oz (117.3 kg)  06/15/15 258 lb (117.028 kg)

## 2015-07-15 ENCOUNTER — Ambulatory Visit
Admission: RE | Admit: 2015-07-15 | Discharge: 2015-07-15 | Disposition: A | Discharge status: Home / Self Care | Payer: Medicare Other | Source: Ambulatory Visit | Attending: Radiation Oncology | Admitting: Radiation Oncology

## 2015-07-15 DIAGNOSIS — Z510 Encounter for antineoplastic radiation therapy: Secondary | ICD-10-CM | POA: Diagnosis not present

## 2015-07-16 ENCOUNTER — Ambulatory Visit: Payer: Medicare Other

## 2015-07-17 ENCOUNTER — Ambulatory Visit: Payer: Medicare Other

## 2015-07-17 ENCOUNTER — Telehealth: Payer: Self-pay | Admitting: *Deleted

## 2015-07-17 ENCOUNTER — Ambulatory Visit
Admission: RE | Admit: 2015-07-17 | Discharge: 2015-07-17 | Disposition: A | Discharge status: Home / Self Care | Payer: Medicare Other | Source: Ambulatory Visit | Attending: Radiation Oncology | Admitting: Radiation Oncology

## 2015-07-17 DIAGNOSIS — Z510 Encounter for antineoplastic radiation therapy: Secondary | ICD-10-CM | POA: Diagnosis not present

## 2015-07-17 NOTE — Telephone Encounter (Signed)
Called to check on patient asked howe she was doing since she cancelled yesterday's treatment, she stated she just didn't feel good with her leg but feels better now, will be here today for treatment, she knows sh finishes ZFriday and will be seen after tx with Dr. Tami Lin me for calling and checking on her, she rides [employer] transportation 10:58 AM

## 2015-07-18 ENCOUNTER — Ambulatory Visit: Payer: Medicare Other

## 2015-07-18 ENCOUNTER — Ambulatory Visit: Admission: RE | Admit: 2015-07-18 | Payer: Medicare Other | Source: Ambulatory Visit | Admitting: Radiation Oncology

## 2015-07-18 ENCOUNTER — Ambulatory Visit
Admission: RE | Admit: 2015-07-18 | Discharge: 2015-07-18 | Disposition: A | Discharge status: Home / Self Care | Payer: Medicare Other | Source: Ambulatory Visit | Attending: Radiation Oncology | Admitting: Radiation Oncology

## 2015-07-18 DIAGNOSIS — Z510 Encounter for antineoplastic radiation therapy: Secondary | ICD-10-CM | POA: Diagnosis not present

## 2015-07-18 NOTE — Progress Notes (Signed)
Weekly Management Note Current Dose: 30 Gy  Projected Dose: 30 Gy   Narrative:  The patient presents for routine under treatment assessment.  CBCT/MVCT images/Port film x-rays were reviewed.  The chart was checked. No pain now. Takes 1 pain pill at night. Does not have appointment with Dr. Benay Spice.   Physical Findings:  Unchanged  Vitals:  Filed Vitals:   07/19/15 1427  BP: 133/78  Pulse: 80  Temp: 97.7 F (36.5 C)  Resp: 16   Weight:  Wt Readings from Last 3 Encounters:  07/19/15 256 lb 12.8 oz (116.484 kg)  07/12/15 259 lb 6.4 oz (117.663 kg)  07/05/15 258 lb 9.6 oz (117.3 kg)   Lab Results  Component Value Date   WBC 10.4 06/17/2015   HGB 9.4* 06/17/2015   HCT 29.0* 06/17/2015   MCV 91.5 06/17/2015   PLT 147* 06/17/2015   Lab Results  Component Value Date   CREATININE 1.01* 06/16/2015   BUN 24* 06/16/2015   NA 137 06/16/2015   K 5.0 06/16/2015   CL 103 06/16/2015   CO2 27 06/16/2015     Impression:  The patient is tolerating radiation.  Plan:  Follow up in 1 month. Will ask GI to make appt with Dr. Benay Spice next week. Pt will need pain medication and is concerned about what her treatment plan is.     ------------------------------------------------  Thea Silversmith, MD

## 2015-07-19 ENCOUNTER — Ambulatory Visit
Admission: RE | Admit: 2015-07-19 | Discharge: 2015-07-19 | Disposition: A | Discharge status: Home / Self Care | Payer: Medicare Other | Source: Ambulatory Visit | Attending: Radiation Oncology | Admitting: Radiation Oncology

## 2015-07-19 ENCOUNTER — Encounter: Payer: Self-pay | Admitting: Radiation Oncology

## 2015-07-19 ENCOUNTER — Other Ambulatory Visit: Payer: Self-pay | Admitting: *Deleted

## 2015-07-19 ENCOUNTER — Telehealth: Payer: Self-pay | Admitting: Oncology

## 2015-07-19 VITALS — BP 133/78 | HR 80 | Temp 97.7°F | Resp 16 | Wt 256.8 lb

## 2015-07-19 DIAGNOSIS — C7951 Secondary malignant neoplasm of bone: Secondary | ICD-10-CM

## 2015-07-19 DIAGNOSIS — Z510 Encounter for antineoplastic radiation therapy: Secondary | ICD-10-CM | POA: Diagnosis not present

## 2015-07-19 NOTE — Telephone Encounter (Signed)
s.w. pt and advised on 3.31 appt....pt had another MD appointment on 3.30 she wanted to come on 3.31

## 2015-07-19 NOTE — Progress Notes (Signed)
Message from Dr. Pablo Ledger that patient finished RT today. Needs appointment with medical oncology next week. POF to scheduler to see NP on 07/25/15.

## 2015-07-19 NOTE — Progress Notes (Signed)
Weekly rad txs  Right femur and pelvis eot,  Gave 1 month f/u appt card, pt ambulating with a walker,slow and steady , no c/o pain, stated "NONE" gave 1 month f/u appt card 2:28 PM Wt Readings from Last 3 Encounters:  07/19/15 256 lb 12.8 oz (116.484 kg)  07/12/15 259 lb 6.4 oz (117.663 kg)  07/05/15 258 lb 9.6 oz (117.3 kg)   BP 133/78 mmHg  Pulse 80  Temp(Src) 97.7 F (36.5 C) (Oral)  Resp 16  Wt 256 lb 12.8 oz (116.484 kg)

## 2015-07-22 ENCOUNTER — Encounter: Payer: Self-pay | Admitting: *Deleted

## 2015-07-22 NOTE — Progress Notes (Signed)
Pt called regarding increasing her Percocet dose.  She states that she is not sleeping well at night and would just like a "good night of rest."  Pt states that she is taking only one Percocet during the day and 2 at bedtime.  Pt.'s concerns discussed with Dr. Benay Spice and he would like pt to take more Percocet during the day for pain as well for a few days and to call us back if pain is not better.  Pt verbalizes an understanding of MD orders and has no other questions at this time.

## 2015-07-26 ENCOUNTER — Ambulatory Visit (HOSPITAL_BASED_OUTPATIENT_CLINIC_OR_DEPARTMENT_OTHER): Payer: Medicare Other

## 2015-07-26 ENCOUNTER — Other Ambulatory Visit: Payer: Self-pay | Admitting: Nurse Practitioner

## 2015-07-26 ENCOUNTER — Telehealth: Payer: Self-pay | Admitting: Oncology

## 2015-07-26 ENCOUNTER — Ambulatory Visit (HOSPITAL_BASED_OUTPATIENT_CLINIC_OR_DEPARTMENT_OTHER): Payer: Medicare Other | Admitting: Nurse Practitioner

## 2015-07-26 VITALS — BP 144/95 | HR 89 | Temp 98.2°F | Resp 18 | Ht 71.0 in | Wt 253.1 lb

## 2015-07-26 DIAGNOSIS — C787 Secondary malignant neoplasm of liver and intrahepatic bile duct: Secondary | ICD-10-CM | POA: Diagnosis not present

## 2015-07-26 DIAGNOSIS — C187 Malignant neoplasm of sigmoid colon: Secondary | ICD-10-CM

## 2015-07-26 DIAGNOSIS — G620 Drug-induced polyneuropathy: Secondary | ICD-10-CM

## 2015-07-26 DIAGNOSIS — I10 Essential (primary) hypertension: Secondary | ICD-10-CM | POA: Diagnosis not present

## 2015-07-26 DIAGNOSIS — Z452 Encounter for adjustment and management of vascular access device: Secondary | ICD-10-CM | POA: Diagnosis not present

## 2015-07-26 DIAGNOSIS — C189 Malignant neoplasm of colon, unspecified: Secondary | ICD-10-CM

## 2015-07-26 DIAGNOSIS — G4700 Insomnia, unspecified: Secondary | ICD-10-CM | POA: Diagnosis not present

## 2015-07-26 DIAGNOSIS — C7802 Secondary malignant neoplasm of left lung: Secondary | ICD-10-CM | POA: Diagnosis not present

## 2015-07-26 DIAGNOSIS — C7951 Secondary malignant neoplasm of bone: Secondary | ICD-10-CM | POA: Diagnosis not present

## 2015-07-26 DIAGNOSIS — C7801 Secondary malignant neoplasm of right lung: Secondary | ICD-10-CM | POA: Diagnosis not present

## 2015-07-26 DIAGNOSIS — Z95828 Presence of other vascular implants and grafts: Secondary | ICD-10-CM

## 2015-07-26 LAB — COMPREHENSIVE METABOLIC PANEL
ALBUMIN: 3.7 g/dL (ref 3.5–5.0)
ALK PHOS: 91 U/L (ref 40–150)
ALT: 28 U/L (ref 0–55)
ANION GAP: 9 meq/L (ref 3–11)
AST: 47 U/L — AB (ref 5–34)
BILIRUBIN TOTAL: 0.68 mg/dL (ref 0.20–1.20)
BUN: 12.5 mg/dL (ref 7.0–26.0)
CALCIUM: 10.1 mg/dL (ref 8.4–10.4)
CHLORIDE: 107 meq/L (ref 98–109)
CO2: 26 mEq/L (ref 22–29)
CREATININE: 0.8 mg/dL (ref 0.6–1.1)
EGFR: >90 mL/min/{1.73_m2} (ref >90)
Glucose: 99 mg/dl (ref 70–140)
Potassium: 3.3 mEq/L — ABNORMAL LOW (ref 3.5–5.1)
Sodium: 141 mEq/L (ref 136–145)
TOTAL PROTEIN: 7.6 g/dL (ref 6.4–8.3)

## 2015-07-26 LAB — CBC WITH DIFFERENTIAL/PLATELET
BASO%: 0.9 % (ref 0.0–2.0)
BASOS ABS: 0 10*3/uL (ref 0.0–0.1)
EOS%: 7.8 % — ABNORMAL HIGH (ref 0.0–7.0)
Eosinophils Absolute: 0.2 10*3/uL (ref 0.0–0.5)
HEMATOCRIT: 49.4 % — AB (ref 34.8–46.6)
HEMOGLOBIN: 15.9 g/dL (ref 11.6–15.9)
LYMPH#: 0.5 10*3/uL — AB (ref 0.9–3.3)
LYMPH%: 15.1 % (ref 14.0–49.7)
MCH: 28.7 pg (ref 25.1–34.0)
MCHC: 32.1 g/dL (ref 31.5–36.0)
MCV: 89.4 fL (ref 79.5–101.0)
MONO#: 0.3 10*3/uL (ref 0.1–0.9)
MONO%: 8 % (ref 0.0–14.0)
NEUT%: 68.2 % (ref 38.4–76.8)
NEUTROS ABS: 2.2 10*3/uL (ref 1.5–6.5)
Platelets: 149 10*3/uL (ref 145–400)
RBC: 5.52 10*6/uL — ABNORMAL HIGH (ref 3.70–5.45)
RDW: 17.6 % — ABNORMAL HIGH (ref 11.2–14.5)
WBC: 3.2 10*3/uL — ABNORMAL LOW (ref 3.9–10.3)

## 2015-07-26 MED ORDER — HEPARIN SOD (PORK) LOCK FLUSH 100 UNIT/ML IV SOLN
500.0000 [IU] | Freq: Once | INTRAVENOUS | Status: AC
  Administered 2015-07-26: 500 [IU] via INTRAVENOUS
  Filled 2015-07-26: qty 5

## 2015-07-26 MED ORDER — TEMAZEPAM 15 MG PO CAPS: 15.0000 mg | ORAL_CAPSULE | Freq: Every evening | ORAL | Status: DC | PRN

## 2015-07-26 MED ORDER — SODIUM CHLORIDE 0.9% FLUSH
10.0000 mL | INTRAVENOUS | Status: DC | PRN
  Administered 2015-07-26: 10 mL via INTRAVENOUS
  Filled 2015-07-26: qty 10

## 2015-07-26 MED ORDER — POTASSIUM CHLORIDE CRYS ER 20 MEQ PO TBCR: 20.0000 meq | EXTENDED_RELEASE_TABLET | Freq: Every day | ORAL | Status: DC

## 2015-07-26 NOTE — Telephone Encounter (Signed)
appt made and avs printed °

## 2015-07-26 NOTE — Patient Instructions (Signed)

## 2015-07-26 NOTE — Progress Notes (Addendum)
[place] OFFICE PROGRESS NOTE   Diagnosis:   Colon cancer  INTERVAL HISTORY:   Ms. Mihalko returns for follow-up. She feels she is recovering well from the recent surgery. Pain is much better. She has been taking Aleve one or 2 times a day. No nausea or vomiting. No diarrhea. She has good appetite. No bleeding. Main complaint today is difficulty sleeping through the night. She is able to follow sleep easily but is unable to stay asleep.  Objective:  Vital signs in last 24 hours:  Blood pressure 144/95, pulse 89, temperature 98.2 F (36.8 C), temperature source Oral, resp. rate 18, height '5\' 11"'$  (1.803 m), weight 253 lb 1.6 oz (114.805 kg), SpO2 100 %.    HEENT: No thrush or ulcers. Lymphatics: No palpable cervical or supraclavicular lymph nodes. Resp: Lungs clear bilaterally. Cardio: Regular rate and rhythm. GI: Abdomen soft and nontender. No hepatomegaly. Vascular: No leg edema.  Skin: 3 well-healed surgical incisions right lateral leg. Port-A-Cath without erythema.    Lab Results:  Lab Results  Component Value Date   WBC 10.4 06/17/2015   HGB 9.4* 06/17/2015   HCT 29.0* 06/17/2015   MCV 91.5 06/17/2015   PLT 147* 06/17/2015   NEUTROABS 4.5 06/13/2015    Imaging:  No results found.  Medications: I have reviewed the patient's current medications.  Assessment/Plan: 1. Metastatic colon cancer (T4, N0, M1) adenocarcinoma of the sigmoid colon status post sigmoid colectomy and partial right hepatectomy 04/11/2012. Microsatellite stable. No loss of mismatch repair proteins. K-ras wild-type on the initial codon 12 and 13 testing. K-ras Q61L and APC mutations identified on extended testing. BRAF not mutated.  She completed cycle 1 CAPOX beginning 05/25/2012.   She completed cycle 8 CAPOX beginning 10/19/2012.   CEA on 10/19/2012 normal at 0.8.   CEA 6.1 on 01/11/2013   Restaging CT on 01/11/2013 consistent with multiple new pulmonary metastases    Cycle 1 of FOLFIRI/Avastin 01/18/2013; cycle 2 02/01/2013,cycle 3 02/16/2013.   CEA 5.0 on 03/01/2013.   Cycle 4 FOLFIRI/Avastin 03/01/2013.   Cycle 5 FOLFIRI/Avastin 03/15/2013.   Restaging chest CT 03/29/2013 with possible slight decrease in size of a left upper lobe metastasis. Other pulmonary nodules stable.   Cycle 6 FOLFIRI/Avastin 03/29/2013   Cycle 7 of FOLFIRI/Avastin 04/12/2013.   CEA 6.3 05/03/2013.   Cycle 8 FOLFIRI/Avastin 05/03/2013.   Cycle 9 FOLFIRI/Avastin 05/17/2013.   Cycle 10 FOLFIRI/Avastin 05/31/2013.   CEA 10.6 on 06/19/2013.   CT chest 06/19/2013 with mild progression of bilateral pulmonary nodules/metastases.   Decision made to continue FOLFIRI/Avastin. She completed cycle 11 06/21/2013.   Cycle 12 FOLFIRI/Avastin 07/05/2013   Cycle 13 FOLFIRI/Avastin 07/19/2013   Cycle 14 FOLFIRI/Avastin 08/01/2013.   CT chest 08/28/2013 with increased in size of bilateral lung nodules.   PET scan 09/22/2013 with multiple hypermetabolic pulmonary nodules bilaterally. Nodules demonstrate progressive enlargement compared with prior CTs. No evidence of extrathoracic metastatic disease.   Status post SRS 2 lung nodules, completed 10/18/2013  CT 01/12/2014 with airspace disease surrounding treated pulmonary nodules, no new nodules  01/12/2014 CEA 0.9  CT chest 04/12/2014 with improvement in airspace disease surrounding treated pulmonary nodules, no new nodules  04/12/2014 CEA 8.2  07/03/2014 CEA 60.3  CT 07/03/2014 with masslike enlargement of 2 treated lung lesions  Cycle 1 Lonsurf 07/12/2014.  Cycle 2 Lonsurf 08/11/2014 or 08/12/2014  Cycle 3 Lonsurf 09/08/2014  Cycle 4 Lonsurf 10/06/2014  Restaging CT of the chest on 10/01/2014 with stable lung nodules  Cycle  5 Lonsurf 11/05/2014  Cycle 6 Lonsurf 12/03/2014  Restaging CTs of the chest, abdomen, and pelvis on 12/26/2014 with no evidence of disease progression, stable lung  nodules  Cycle 7 Lonsurf 12/31/2014  Cycle 8 Lonsurf 01/28/2015  Elevated CEA 03/12/2015  CT 03/19/2015 with enlargement of bilateral lung masses and progressive bone metastases including an enlarging lesion at T6 with replacement of the vertebral body  Cycle 1 FOLFOX/Avastin 03/25/2015  Cycle 2 FOLFOX/Avastin 04/08/2015  Cycle 3 FOLFOX/Avastin 04/23/2015  Cycle 4 FOLFOX/Avastin 05/08/2015  Cycle 5 FOLFOX/Avastin 05/22/2015  Restaging CT scans 06/10/2015 with decreased size of bilateral pulmonary metastases; stable hepatic steatosis and postoperative changes from partial hepatectomy; no definite liver metastases; mild increase in size of lytic bone metastasis in the right ilium; stable T6 vertebral body lytic metastasis  CT right femur 06/12/2015 with lesion in the right femur, status post placement of an intramedullary nail 06/15/2015 2. Microcytic anemia, iron deficiency. Improved.  3. Status post Port-A-Cath placement 03/14/2012. X-ray showed the Port-A-Cath tip to be in the azygos vein. The left-sided Port-A-Cath was removed and a new right Port-A-Cath was placed on 05/23/2012. 4. 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 5. Hypertension-persistent on amlodipine. HCTZ added 12/26/2014 6. Delayed nausea following cycle 5 FOLFIRI/Avastin. Emend was added to the premedication regimen 7. Nausea secondary to Lonsurf-improved with Ativan 8. Mild oxaliplatin neuropathy 9. Right groin pain. Question referred pain from the hip. 10. CT right femur 06/12/2015 with a partially visualized 4.3 x 2.3 cm lucent lesion in the inferior aspect of the right iliac bone superior to the right acetabulum. Associated destruction of the cortex of the bone; second lesion noted in the midportion of the right femur measuring 1.7 x 6.6 cm. Partial erosive changes of the cortex of the bone adjacent to the lesion.   Status post placement of an  intramedullary nail right femur 06/15/2015.   She completed palliative radiation to the right femur and right pelvis 07/04/2015 through 07/19/2015. 11. Difficulty sleeping. She will try temazepam.    Disposition: Ms. Zabawa appears stable. To review, she completed cycle 5 FOLFOX/Avastin 05/22/2015. Restaging CT scans 06/10/2015 showed a decrease in the size of lung lesions and progressive disease in the bones. CT of the right femur on 06/12/2015 showed a lesion in the inferior aspect of the right iliac bone and a second lesion in the midportion of the right femur. There were erosive changes of the cortex. She underwent placement of an intramedullary nail right femur on 06/15/2015 and completed a course of radiation to the right femur and pelvis 07/04/2015 through 07/19/2015.  Her pain is better following surgery and radiation. I discouraged use of Aleve as well as other nonsteroidals as she is on anticoagulation with Xarelto. She will try Tylenol and contact the office if this is not effective.  For the difficulty sleeping she will try temazepam 15 mg at bedtime as needed.  Ms. Overbaugh is interested in pursuing further treatment. Dr. Truett Perna reviewed treatment options with her at today's visit. She is unable to travel for a second opinion/clinical trial. Dr. Truett Perna recommends Regorafenib. We reviewed potential toxicities including nausea, rash, diarrhea, myelosuppression, hypertension, hepatotoxicity, increased risk of blood clots and strokes, bleeding, wound healing issues, wound dehiscence. She is agreeable to proceed. We anticipate she will begin cycle 1 on 08/02/2015. We will obtain baseline labs today to include CBC, chemistry panel and CEA.  She will return for a follow-up visit on 08/16/2015. She will  contact the office in the interim with any problems.  Patient seen with Dr. Benay Spice. 25 minutes were spent face-to-face at today's visit with the majority of that time involved in  counseling/coordination of care.    Ned Card ANP/GNP-BC   07/26/2015  2:20 PM  This was a shared visit with Ned Card. Ms. Kincade has progressive metastatic colon cancer. Systemic treatment options are limited. She cannot travel for a clinical trial. We discussed the small expected response rate and toxicities associated with regorafenib. The plan is to begin a trial of regorafenib on 08/02/2015.  Julieanne Manson, M.D.

## 2015-07-26 NOTE — Telephone Encounter (Signed)
Pt came by scheduling....she wants to call us back to sched appt

## 2015-07-27 LAB — CEA: CEA1: 762 ng/mL — AB (ref 0.0–4.7)

## 2015-07-29 ENCOUNTER — Telehealth: Payer: Self-pay

## 2015-07-29 NOTE — Telephone Encounter (Signed)
Patient called asking questions about a new medication Dr. Benay Spice wants her to start on 08/02/15- stivarga.  Dr. Benay Spice will be writing a prescription and giving it to [place] to get a pre auth.  Gerald Stabs will call patient to update. Patient stated understanding, she states she is very anxious to get started on the medication.

## 2015-07-30 ENCOUNTER — Other Ambulatory Visit: Payer: Self-pay | Admitting: *Deleted

## 2015-07-30 ENCOUNTER — Telehealth: Payer: Self-pay | Admitting: Pharmacist

## 2015-07-30 ENCOUNTER — Encounter: Payer: Self-pay | Admitting: Oncology

## 2015-07-30 ENCOUNTER — Telehealth: Payer: Self-pay | Admitting: Oncology

## 2015-07-30 ENCOUNTER — Other Ambulatory Visit: Payer: Self-pay | Admitting: Oncology

## 2015-07-30 DIAGNOSIS — C189 Malignant neoplasm of colon, unspecified: Secondary | ICD-10-CM

## 2015-07-30 DIAGNOSIS — C787 Secondary malignant neoplasm of liver and intrahepatic bile duct: Principal | ICD-10-CM

## 2015-07-30 MED ORDER — REGORAFENIB 40 MG PO TABS: 120.0000 mg | ORAL_TABLET | Freq: Every day | ORAL | Status: DC

## 2015-07-30 NOTE — Telephone Encounter (Signed)
07/30/15: New Rx for Stivarga sent to [facility]

## 2015-07-30 NOTE — Progress Notes (Signed)
Sent prior auth for temazepam

## 2015-07-30 NOTE — Telephone Encounter (Signed)
S.w. Pt and gv April appt

## 2015-07-31 ENCOUNTER — Encounter: Payer: Self-pay | Admitting: Oncology

## 2015-07-31 MED FILL — *STIVARGA 40MG TABLET: 40 | 28 days supply | Qty: 84 | Fill #0

## 2015-07-31 NOTE — Progress Notes (Signed)
[employer] approved 04/26/15-04/26/16 [id number]

## 2015-07-31 NOTE — Telephone Encounter (Signed)
07/30/15: Prior Auth required on Stivarga. Prior Auth submitted online through [email or web address] to [employer] (Fax: [phone])

## 2015-08-01 ENCOUNTER — Encounter: Payer: Self-pay | Admitting: Oncology

## 2015-08-01 NOTE — Progress Notes (Signed)
approved [id number] 04/26/15-04/26/16-sent to medical records and faxed to [facility]

## 2015-08-02 ENCOUNTER — Telehealth: Payer: Self-pay | Admitting: *Deleted

## 2015-08-02 ENCOUNTER — Encounter: Payer: Self-pay | Admitting: Pharmacist

## 2015-08-02 NOTE — Telephone Encounter (Signed)
Oncology Nurse Navigator Documentation  Oncology Nurse Navigator Flowsheets 08/02/2015  Navigator Location [facility]  Navigator Encounter Type Telephone  Telephone Outgoing Call;Appt Confirmation/Clarification;Education--reviewed her return appt. Date /time  Patient Visit Type -  Treatment Phase Active Tx--starting regorafenib 08/03/15  Barriers/Navigation Needs Education  Education Other--how to take med; safe handling of drug, be sure to take BP meds  Interventions Education Method  Education Method Verbal;Teach-back  Acuity Level 2  Time Spent with Patient 15  Time Spent with Patient (Retired) -  Confirmed w/pharmacy that her copay on regorafenib is $3.70. Patient notified and will pick up drug and start tomorrow with breakfast.

## 2015-08-02 NOTE — Progress Notes (Signed)
Oral Chemotherapy Pharmacist Encounter   I spoke with patient for overview of new oral chemotherapy medication: Stivarga. Pt is doing well. The prescriptions have been sent to the [facility] for benefit analysis and approval. Copay was < $10. Pt picked up on 4/7 and will start on 4/8. She understands to take 3 tablets by mouth daily with breakfast for 21 days only. Repeat every 28 days. I informed her that she will have left over tablets at the end of 21 days as she is on a lower dose. She voiced understanding.   Counseled patient on administration, dosing, side effects, safe handling, and monitoring. Side effects include but not limited to: Fatigue, loss of appetite, nausea, diarrhea, abdominal pain, high blood pressure.  [name] voiced understanding and appreciation.   All questions answered.  Will follow up in 1-2 weeks for adherence and toxicity management.   Thank you,  Montel Clock, PharmD, [facility]

## 2015-08-03 ENCOUNTER — Emergency Department (HOSPITAL_COMMUNITY): Payer: Medicare Other

## 2015-08-03 ENCOUNTER — Encounter (HOSPITAL_COMMUNITY): Payer: Self-pay | Admitting: Nurse Practitioner

## 2015-08-03 ENCOUNTER — Emergency Department (HOSPITAL_COMMUNITY)
Admission: EM | Admit: 2015-08-03 | Discharge: 2015-08-04 | Disposition: A | Discharge status: Home / Self Care | Payer: Medicare Other | Attending: Emergency Medicine | Admitting: Emergency Medicine

## 2015-08-03 DIAGNOSIS — Z923 Personal history of irradiation: Secondary | ICD-10-CM | POA: Insufficient documentation

## 2015-08-03 DIAGNOSIS — M545 Low back pain: Secondary | ICD-10-CM | POA: Diagnosis present

## 2015-08-03 DIAGNOSIS — M62838 Other muscle spasm: Secondary | ICD-10-CM | POA: Diagnosis not present

## 2015-08-03 DIAGNOSIS — C7951 Secondary malignant neoplasm of bone: Secondary | ICD-10-CM | POA: Diagnosis not present

## 2015-08-03 DIAGNOSIS — Z8659 Personal history of other mental and behavioral disorders: Secondary | ICD-10-CM | POA: Diagnosis not present

## 2015-08-03 DIAGNOSIS — R011 Cardiac murmur, unspecified: Secondary | ICD-10-CM | POA: Insufficient documentation

## 2015-08-03 DIAGNOSIS — Z7901 Long term (current) use of anticoagulants: Secondary | ICD-10-CM | POA: Diagnosis not present

## 2015-08-03 DIAGNOSIS — Z85038 Personal history of other malignant neoplasm of large intestine: Secondary | ICD-10-CM | POA: Diagnosis not present

## 2015-08-03 DIAGNOSIS — R52 Pain, unspecified: Secondary | ICD-10-CM

## 2015-08-03 DIAGNOSIS — Z872 Personal history of diseases of the skin and subcutaneous tissue: Secondary | ICD-10-CM | POA: Diagnosis not present

## 2015-08-03 DIAGNOSIS — G8929 Other chronic pain: Secondary | ICD-10-CM | POA: Insufficient documentation

## 2015-08-03 DIAGNOSIS — Z79899 Other long term (current) drug therapy: Secondary | ICD-10-CM | POA: Insufficient documentation

## 2015-08-03 DIAGNOSIS — I10 Essential (primary) hypertension: Secondary | ICD-10-CM | POA: Insufficient documentation

## 2015-08-03 NOTE — ED Notes (Signed)
Bed: [facility] Expected date:  Expected time:  Means of arrival:  Comments: EMS back pain

## 2015-08-03 NOTE — ED Notes (Signed)
Per EMS- Pt c/o severe back pain top left. Pain for 5 weeks. Pain unrelieved by tylenol. Hx of Colon cancer.

## 2015-08-03 NOTE — ED Notes (Signed)
Pt c/o severe back pain unrelieved by tylenol at home.

## 2015-08-03 NOTE — ED Provider Notes (Signed)
CSN: [id number]     Arrival date & time 08/03/15  2217 History  By signing my name below, I, Hansel Feinstein, attest that this documentation has been prepared under the direction and in the presence of Nehal Shives, MD. Electronically Signed: Hansel Feinstein, ED Scribe. 08/03/2015. 12:49 AM.    Chief Complaint  Patient presents with  . Back Pain   Patient is a 60 y.o. female presenting with back pain. The history is provided by the patient. No language interpreter was used.  Back Pain Location:  Lumbar spine Quality:  Aching Radiates to:  Does not radiate Pain severity:  Moderate Onset quality:  Gradual Duration:  5 weeks Timing:  Constant Progression:  Worsening Chronicity:  Chronic Context: not falling, not lifting heavy objects and not recent injury   Relieved by:  Nothing Worsened by:  Movement, lying down and ambulation Ineffective treatments: acetaminophen  Associated symptoms: no abdominal pain, no abdominal swelling, no bladder incontinence, no bowel incontinence, no chest pain, no dysuria, no fever, no headaches, no leg pain, no numbness, no paresthesias, no pelvic pain, no perianal numbness, no tingling, no weakness and no weight loss   Risk factors: hx of cancer and recent surgery    HPI Comments: Jamika Sadek is a 60 y.o. female s/p right femur im nail 06/15/15 with h/o colon cancer on percocet who presents to the Emergency Department complaining of moderate, worsening, acute on chronic left lateral lower back pain onset 5 weeks ago. Denies recent injury, falls or heavy lifting. She states she has taken tylenol with no relief of her pain. She states pain is worsened with lying flat, movement and  ambulation. Pt is ambulatory with minimal difficulty. No h/o IVDU. Pt is followed by Dr. Ammie Dalton for her cancer and has an appointment on 08/19/15. She has not had MRI of her back. Denies bowel or bladder incontinence, saddle anesthesia, fever, cough, abdominal pain, dysuria, hematuria,  frequency, rash. Denies numbness, focal weakness or paresthesia of the lower extremities.    Past Medical History  Diagnosis Date  . Hypertension     pt states she stopped taking meds while in her 20's  . Blood in stool   . History of blood transfusion   . Anemia   . Eczema   . Heart murmur     does not see a cardiologist has been told she does has a murmur and also that she does not have a murmur  . Anxiety   . Neuromuscular disorder (HCC)     numbness feet  . Hx of radiation therapy 10/06/13,16th,19th,22nd,&10/18/13    SBRT lung  . Cancer ([place])   . Metastatic colon cancer to liver [facility])    Past Surgical History  Procedure Laterality Date  . Breast lumpectomy      left breast  . Colonoscopy  02/18/2012    Procedure: COLONOSCOPY;  Surgeon: Beryle Beams, MD;  Location: [place];  Service: Endoscopy;  Laterality: N/A;  . Portacath placement  03/14/2012    Procedure: INSERTION PORT-A-CATH;  Surgeon: Ralene Ok, MD;  Location: [place];  Service: General;  Laterality: N/A;  . Laparoscopic sigmoid colectomy  04/11/2012    Procedure: LAPAROSCOPIC SIGMOID COLECTOMY;  Surgeon: Stark Klein, MD;  Location: [place];  Service: General;;  converted to Open with splenic flexure take down  . Open partial hepatectomy [83]  04/11/2012    Procedure: OPEN PARTIAL HEPATECTOMY [83];  Surgeon: Stark Klein, MD;  Location: [place];  Service: General;  Laterality: Right;  .  Laparoscopy  04/11/2012    Procedure: LAPAROSCOPY DIAGNOSTIC;  Surgeon: Stark Klein, MD;  Location: [place];  Service: General;  Laterality: N/A;  . Cholecystectomy  04/11/2012    Procedure: CHOLECYSTECTOMY;  Surgeon: Stark Klein, MD;  Location: [place];  Service: General;;  . Ileocecetomy  04/11/2012    Procedure: Pennie Rushing;  Surgeon: Stark Klein, MD;  Location: [place];  Service: General;  Laterality: N/A;  . Port a cath revision  05/23/2012    Procedure: PORT A CATH REVISION;  Surgeon: Ralene Ok, MD;  Location: [place];   Service: General;  Laterality: Right;  Port-A-Cath repositioning versus replacement  . Abdominal hysterectomy      early 50's  . Femur im nail Right 06/15/2015    Procedure: INTRAMEDULLARY (IM) NAIL INTERTROCHANTERIC ;  Surgeon: Mcarthur Rossetti, MD;  Location: [facility];  Service: Orthopedics;  Laterality: Right;   Family History  Problem Relation Age of Onset  . Hypertension Mother   . Cancer Mother 52    breast ca  . Heart disease Father    Social History  Substance Use Topics  . Smoking status: Never Smoker   . Smokeless tobacco: Never Used  . Alcohol Use: No   OB History    No data available     Review of Systems  Constitutional: Negative for fever and weight loss.  Respiratory: Negative for cough.   Cardiovascular: Negative for chest pain.  Gastrointestinal: Negative for abdominal pain and bowel incontinence.  Genitourinary: Negative for bladder incontinence, dysuria, frequency, hematuria, difficulty urinating and pelvic pain.  Musculoskeletal: Positive for back pain.  Skin: Negative for rash.  Neurological: Negative for tingling, weakness, numbness, headaches and paresthesias.  All other systems reviewed and are negative.  Allergies  Review of patient's allergies indicates no known allergies.  Home Medications   Prior to Admission medications   Medication Sig Start Date End Date Taking? Authorizing Provider  amLODipine (NORVASC) 10 MG tablet TAKE 1 TABLET BY MOUTH EVERY DAY Patient taking differently: Take 1 tablet by mouth every day. 05/20/15  Yes Ladell Pier, MD  amLODipine (NORVASC) 10 MG tablet Take 10 mg by mouth daily.   Yes Historical Provider, MD  atorvastatin (LIPITOR) 20 MG tablet Take 20 mg by mouth daily.   Yes Historical Provider, MD  calcium-vitamin D (OSCAL WITH D) 500-200 MG-UNIT tablet Take 1 tablet by mouth daily with breakfast.   Yes Historical Provider, MD  diclofenac sodium (VOLTAREN) 1 % GEL Apply 2 g topically 4 (four) times  daily. Patient taking differently: Apply 2 g topically 4 (four) times daily as needed (port).  05/01/15  Yes Iline Oven, MD  hydrochlorothiazide (MICROZIDE) 12.5 MG capsule Take 1 capsule (12.5 mg total) by mouth daily. 12/26/14  Yes Ladell Pier, MD  IRON PO Take 1 tablet by mouth daily.   Yes Historical Provider, MD  lidocaine-prilocaine (EMLA) cream Apply topically as needed. Apply to [facility] site 1-2 hours prior to stick and cover with plastic wrap to numb site 08/16/13  Yes Owens Shark, NP  methocarbamol (ROBAXIN) 500 MG tablet Take 1 tablet (500 mg total) by mouth every 6 (six) hours as needed for muscle spasms. 06/18/15  Yes Mcarthur Rossetti, MD  Multiple Vitamins-Minerals (MULTIVITAMIN) tablet Take 1 tablet by mouth daily. 01/22/14  Yes Ladell Pier, MD  potassium chloride SA (K-DUR,KLOR-CON) 20 MEQ tablet TAKE 1 TABLET BY MOUTH DAILY 07/29/15  Yes Ladell Pier, MD  regorafenib [facility]) 40 MG tablet Take  3 tablets (120 mg total) by mouth daily with breakfast. For 21 days then rest for 7 days. Take with low fat meal. Caution: Chemotherapy 08/02/15  Yes Ladell Pier, MD  rivaroxaban (XARELTO) 20 MG TABS tablet Take 20 mg by mouth daily with supper.   Yes Historical Provider, MD  tiZANidine (ZANAFLEX) 4 MG tablet Take 4 mg by mouth 2 (two) times daily as needed for muscle spasms.  06/24/15  Yes Historical Provider, MD  XARELTO 20 MG TABS tablet TAKE 1 TABLET BY MOUTH EVERY DAY 06/25/15  Yes Ladell Pier, MD  loperamide (IMODIUM) 2 MG capsule TAKE ONE CAPSULE BY MOUTH AS NEEDED FOR DIARRHEA OR LOOSE STOOLS. MAX OF 8 CAPSULES PER DAY Patient not taking: Reported on 08/03/2015 05/17/15   Ladell Pier, MD  LORazepam (ATIVAN) 0.5 MG tablet Place 1 tablet (0.5 mg total) under the tongue every 6 (six) hours as needed (for nausea). Causes drowsiness Patient not taking: Reported on 08/03/2015 05/08/15   Owens Shark, NP  ondansetron (ZOFRAN) 8 MG tablet TAKE 1 TABLET BY MOUTH EVERY 8 HOURS  AS NEEDED FOR NAUSEA AND VOMITING Patient not taking: Reported on 08/03/2015 11/29/14   Ladell Pier, MD  oxyCODONE-acetaminophen (PERCOCET/ROXICET) 5-325 MG tablet Take 1-2 tablets by mouth every 4 (four) hours as needed for severe pain. Patient not taking: Reported on 08/03/2015 07/12/15   Kyung Rudd, MD  polyethylene glycol powder [facility]) powder Take 17 g by mouth daily as needed. Patient not taking: Reported on 08/03/2015 06/16/12   Owens Shark, NP  prochlorperazine (COMPAZINE) 10 MG tablet TAKE 1 TABLET BY MOUTH EVERY 6 HOURS AS NEEDED FOR NAUSEA OR VOMITING Patient not taking: Reported on 08/03/2015 08/06/14   Owens Shark, NP  temazepam (RESTORIL) 15 MG capsule Take 1 capsule (15 mg total) by mouth at bedtime as needed for sleep. Patient not taking: Reported on 08/03/2015 07/26/15   Owens Shark, NP   BP 143/103 mmHg  Pulse 96  Temp(Src) 98.4 F (36.9 C) (Oral)  Resp 18  SpO2 100% Physical Exam  Constitutional: She is oriented to person, place, and time. She appears well-developed and well-nourished.  HENT:  Head: Normocephalic and atraumatic.  Mouth/Throat: Oropharynx is clear and moist. No oropharyngeal exudate.  Eyes: Conjunctivae and EOM are normal. Pupils are equal, round, and reactive to light.  Neck: Normal range of motion. Neck supple. No JVD present. No tracheal deviation present.  No bruits.   Cardiovascular: Normal rate, regular rhythm and normal heart sounds.  Exam reveals no gallop and no friction rub.   No murmur heard. RRR.   Pulmonary/Chest: Effort normal and breath sounds normal. No stridor. No respiratory distress. She has no wheezes. She has no rales.  Lungs CTA bilaterally.   Abdominal: Soft. Bowel sounds are normal. She exhibits no distension and no mass. There is no tenderness. There is no rebound and no guarding.  Musculoskeletal: Normal range of motion. She exhibits tenderness.       Cervical back: Normal.       Thoracic back: Normal.       Lumbar back:  Normal.       Back:  No step-offs, crepitus or point tenderness of the T or L spine. Intact L5/s1 intact perineal sensation and intact gait  Lymphadenopathy:    She has no cervical adenopathy.  Neurological: She is alert and oriented to person, place, and time. She has normal reflexes.  Skin: Skin is warm and dry.  Psychiatric:  She has a normal mood and affect.  Nursing note and vitals reviewed.   ED Course  Procedures (including critical care time) DIAGNOSTIC STUDIES: Oxygen Saturation is 100% on RA, normal by my interpretation.    COORDINATION OF CARE: 12:42 AM Discussed treatment plan with pt at bedside which includes XR, UA, CT renal stone study and pt agreed to plan.   Labs Review Labs Reviewed - No data to display  Imaging Review Dg Lumbar Spine Complete  08/04/2015  CLINICAL DATA:  Lumbosacral back pain. History metastatic colon cancer with right pelvic bone metastases. EXAM: LUMBAR SPINE - COMPLETE 4+ VIEW COMPARISON:  CT 06/10/2015 FINDINGS: The alignment is maintained. Vertebral body heights are normal. There is no listhesis. The posterior elements are intact. There is facet arthropathy in the lower lumbar spine. Disc space narrowing at L4-L5 and L5-S1. No evidence of lumbar spine fracture. The known right iliac lesions are not well seen, only partially included in the field of view. No radiographic evidence of focal bone lesion. 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 Patient with history of metastasis to bone, no focal lesion seen radiographically. If there is clinical concern for lumbar spine metastasis, recommend further evaluation with MRI, acuity based on clinical symptomatology. Electronically Signed   By: Jeb Levering M.D.   On: 08/04/2015 00:02   I have personally reviewed and evaluated these images and lab results as part of my medical decision-making.   MDM   Final diagnoses:  None   BP 116/114 mmHg  Pulse 97   Temp(Src) 98.4 F (36.9 C) (Oral)  Resp 18  SpO2 100%   Results for orders placed or performed during the hospital encounter of 08/03/15  Urinalysis, Routine w reflex microscopic (not at [facility])  Result Value Ref Range   Color, Urine YELLOW YELLOW   APPearance CLOUDY (A) CLEAR   Specific Gravity, Urine 1.025 1.005 - 1.030   pH 5.5 5.0 - 8.0   Glucose, UA NEGATIVE NEGATIVE mg/dL   Hgb urine dipstick NEGATIVE NEGATIVE   Bilirubin Urine NEGATIVE NEGATIVE   Ketones, ur NEGATIVE NEGATIVE mg/dL   Protein, ur NEGATIVE NEGATIVE mg/dL   Nitrite NEGATIVE NEGATIVE   Leukocytes, UA NEGATIVE NEGATIVE   Dg Lumbar Spine Complete  08/04/2015  CLINICAL DATA:  Lumbosacral back pain. History metastatic colon cancer with right pelvic bone metastases. EXAM: LUMBAR SPINE - COMPLETE 4+ VIEW COMPARISON:  CT 06/10/2015 FINDINGS: The alignment is maintained. Vertebral body heights are normal. There is no listhesis. The posterior elements are intact. There is facet arthropathy in the lower lumbar spine. Disc space narrowing at L4-L5 and L5-S1. No evidence of lumbar spine fracture. The known right iliac lesions are not well seen, only partially included in the field of view. No radiographic evidence of focal bone lesion. 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 Patient with history of metastasis to bone, no focal lesion seen radiographically. If there is clinical concern for lumbar spine metastasis, recommend further evaluation with MRI, acuity based on clinical symptomatology. Electronically Signed   By: Jeb Levering M.D.   On: 08/04/2015 00:02     Pain is lateral to the spine and consistent with muscle spasm.  But patient is informed of bony mets in the pelvis and need to follow up this week with her oncologist for further work up and treatment.  Patient verbalizes understanding and agrees to follow up Monday strict return precautions given for weakness inability to  urinate or pass stool on any concerns.  voltaren gel short course of percocet and robaxin.    I personally performed the services described in this documentation, which was scribed in my presence. The recorded information has been reviewed and is accurate.      Veatrice Kells, MD 08/04/15 [phone]

## 2015-08-04 ENCOUNTER — Encounter (HOSPITAL_COMMUNITY): Payer: Self-pay | Admitting: Emergency Medicine

## 2015-08-04 ENCOUNTER — Emergency Department (HOSPITAL_COMMUNITY): Payer: Medicare Other

## 2015-08-04 DIAGNOSIS — M545 Low back pain: Secondary | ICD-10-CM | POA: Diagnosis not present

## 2015-08-04 LAB — URINALYSIS, ROUTINE W REFLEX MICROSCOPIC
Bilirubin Urine: NEGATIVE
GLUCOSE, UA: NEGATIVE mg/dL
HGB URINE DIPSTICK: NEGATIVE
Ketones, ur: NEGATIVE mg/dL
LEUKOCYTES UA: NEGATIVE
Nitrite: NEGATIVE
PH: 5.5 (ref 5.0–8.0)
Protein, ur: NEGATIVE mg/dL
Specific Gravity, Urine: 1.025 (ref 1.005–1.030)

## 2015-08-04 MED ORDER — METHOCARBAMOL 500 MG PO TABS
1000.0000 mg | ORAL_TABLET | Freq: Once | ORAL | Status: AC
  Administered 2015-08-04: 1000 mg via ORAL
  Filled 2015-08-04: qty 2

## 2015-08-04 MED ORDER — METHOCARBAMOL 500 MG PO TABS: 500.0000 mg | ORAL_TABLET | Freq: Two times a day (BID) | ORAL | Status: DC

## 2015-08-04 MED ORDER — OXYCODONE-ACETAMINOPHEN 5-325 MG PO TABS: 1.0000 | ORAL_TABLET | Freq: Four times a day (QID) | ORAL | Status: DC | PRN

## 2015-08-04 MED ORDER — DICLOFENAC SODIUM 1 % TD GEL: 4.0000 g | Freq: Four times a day (QID) | TRANSDERMAL | Status: DC

## 2015-08-04 MED ORDER — KETOROLAC TROMETHAMINE 60 MG/2ML IM SOLN
60.0000 mg | Freq: Once | INTRAMUSCULAR | Status: AC
  Administered 2015-08-04: 60 mg via INTRAMUSCULAR
  Filled 2015-08-04: qty 2

## 2015-08-04 NOTE — ED Notes (Signed)
Bed: WA05 Expected date:  Expected time:  Means of arrival:  Comments: 

## 2015-08-05 NOTE — Progress Notes (Signed)
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     Indication for treatment:  Palliative        Radiation treatment dates:   07/04/15 - 07/19/15  Site/dose:   Right femur treated to 30 Gy in 10 fractions, Right pelvis treated to 30 Gy in 10 fractions  Beams/energy:   Right femur: AP/PA / 10X , Right Pelvis: 3D- Conformal / 10X, 15X  Narrative: The patient tolerated raduiation treatment relatively well.     Plan: The patient has completed radiation treatment. The patient will return to radiation oncology clinic for routine followup in one month. I advised them to call or return sooner if they have any questions or concerns related to their recovery or treatment.  ------------------------------------------------  Jodelle Gross, MD, PhD

## 2015-08-07 ENCOUNTER — Encounter (HOSPITAL_COMMUNITY): Payer: Self-pay | Admitting: Emergency Medicine

## 2015-08-07 ENCOUNTER — Emergency Department (HOSPITAL_COMMUNITY)
Admission: EM | Admit: 2015-08-07 | Discharge: 2015-08-07 | Disposition: A | Discharge status: Home / Self Care | Payer: Medicare Other | Attending: Emergency Medicine | Admitting: Emergency Medicine

## 2015-08-07 ENCOUNTER — Telehealth: Payer: Self-pay | Admitting: *Deleted

## 2015-08-07 ENCOUNTER — Ambulatory Visit: Payer: Medicare Other | Admitting: Oncology

## 2015-08-07 ENCOUNTER — Other Ambulatory Visit: Payer: Medicare Other

## 2015-08-07 DIAGNOSIS — R231 Pallor: Secondary | ICD-10-CM | POA: Insufficient documentation

## 2015-08-07 DIAGNOSIS — Z862 Personal history of diseases of the blood and blood-forming organs and certain disorders involving the immune mechanism: Secondary | ICD-10-CM | POA: Diagnosis not present

## 2015-08-07 DIAGNOSIS — R011 Cardiac murmur, unspecified: Secondary | ICD-10-CM | POA: Insufficient documentation

## 2015-08-07 DIAGNOSIS — K625 Hemorrhage of anus and rectum: Secondary | ICD-10-CM | POA: Insufficient documentation

## 2015-08-07 DIAGNOSIS — Z923 Personal history of irradiation: Secondary | ICD-10-CM | POA: Diagnosis not present

## 2015-08-07 DIAGNOSIS — Z872 Personal history of diseases of the skin and subcutaneous tissue: Secondary | ICD-10-CM | POA: Insufficient documentation

## 2015-08-07 DIAGNOSIS — Z85038 Personal history of other malignant neoplasm of large intestine: Secondary | ICD-10-CM | POA: Diagnosis not present

## 2015-08-07 DIAGNOSIS — I10 Essential (primary) hypertension: Secondary | ICD-10-CM | POA: Diagnosis not present

## 2015-08-07 DIAGNOSIS — F419 Anxiety disorder, unspecified: Secondary | ICD-10-CM | POA: Diagnosis not present

## 2015-08-07 DIAGNOSIS — Z8669 Personal history of other diseases of the nervous system and sense organs: Secondary | ICD-10-CM | POA: Diagnosis not present

## 2015-08-07 DIAGNOSIS — Z79899 Other long term (current) drug therapy: Secondary | ICD-10-CM | POA: Insufficient documentation

## 2015-08-07 LAB — CBC WITH DIFFERENTIAL/PLATELET
BASOS ABS: 0 10*3/uL (ref 0.0–0.1)
BASOS PCT: 0 %
EOS ABS: 0.2 10*3/uL (ref 0.0–0.7)
EOS PCT: 2 %
HCT: 33.9 % — ABNORMAL LOW (ref 36.0–46.0)
Hemoglobin: 11.4 g/dL — ABNORMAL LOW (ref 12.0–15.0)
Lymphocytes Relative: 12 %
Lymphs Abs: 1.3 10*3/uL (ref 0.7–4.0)
MCH: 29.2 pg (ref 26.0–34.0)
MCHC: 33.6 g/dL (ref 30.0–36.0)
MCV: 86.7 fL (ref 78.0–100.0)
MONO ABS: 0.7 10*3/uL (ref 0.1–1.0)
Monocytes Relative: 6 %
NEUTROS ABS: 9 10*3/uL — AB (ref 1.7–7.7)
Neutrophils Relative %: 80 %
PLATELETS: 273 10*3/uL (ref 150–400)
RBC: 3.91 MIL/uL (ref 3.87–5.11)
RDW: 15.2 % (ref 11.5–15.5)
WBC: 11.2 10*3/uL — ABNORMAL HIGH (ref 4.0–10.5)

## 2015-08-07 LAB — COMPREHENSIVE METABOLIC PANEL
ALT: 28 U/L (ref 14–54)
ANION GAP: 9 (ref 5–15)
AST: 41 U/L (ref 15–41)
Albumin: 4.5 g/dL (ref 3.5–5.0)
Alkaline Phosphatase: 87 U/L (ref 38–126)
BUN: 18 mg/dL (ref 6–20)
CHLORIDE: 105 mmol/L (ref 101–111)
CO2: 26 mmol/L (ref 22–32)
Calcium: 10.2 mg/dL (ref 8.9–10.3)
Creatinine, Ser: 0.89 mg/dL (ref 0.44–1.00)
Glucose, Bld: 129 mg/dL — ABNORMAL HIGH (ref 65–99)
Potassium: 4.3 mmol/L (ref 3.5–5.1)
SODIUM: 140 mmol/L (ref 135–145)
Total Bilirubin: 0.3 mg/dL (ref 0.3–1.2)
Total Protein: 8.8 g/dL — ABNORMAL HIGH (ref 6.5–8.1)

## 2015-08-07 LAB — PROTIME-INR
INR: 2.07 — AB (ref 0.00–1.49)
Prothrombin Time: 23.1 seconds — ABNORMAL HIGH (ref 11.6–15.2)

## 2015-08-07 LAB — POC OCCULT BLOOD, ED: FECAL OCCULT BLD: POSITIVE — AB

## 2015-08-07 MED ORDER — HEPARIN SOD (PORK) LOCK FLUSH 100 UNIT/ML IV SOLN
500.0000 [IU] | Freq: Once | INTRAVENOUS | Status: AC
  Administered 2015-08-07: 500 [IU]
  Filled 2015-08-07: qty 5

## 2015-08-07 NOTE — Telephone Encounter (Signed)
Dr. Benay Spice notified of pt.'s ED admission and pt condition.  Pt to keep scheduled appt on 08/19/15 and call office if any further bleeding occurs per Dr. Benay Spice.

## 2015-08-07 NOTE — Telephone Encounter (Signed)
VM message received form patient stating that she had been in the ED last night with rectal bleeding.  She was evaluated and discharged back home with instructions to follow up with Dr. Benay Spice.  Apparently, bleeding occurs when patient has a bowel movement. TC to patient this morning. She states she feels well. She has has not had a BM yet this morning, so she has not noted any bleeding yet today. Her next scheduled appt is 08/19/15 for labs and MD visit.

## 2015-08-07 NOTE — ED Notes (Signed)
Pt BIB EMS from home for rectal bleeding; pt has hx of colon cancer; prescribed two different medications last week that MD stated would possibly cause rectal bleeding; MD recommended evaluation at ER; pt only having rectal bleeding during bowel movements; denies pain and constipation

## 2015-08-07 NOTE — ED Provider Notes (Signed)
CSN: [id number]     Arrival date & time 08/07/15  0009 History   First MD Initiated Contact with Patient 08/07/15 0014     Chief Complaint  Patient presents with  . Rectal Bleeding     (Consider location/radiation/quality/duration/timing/severity/associated sxs/prior Treatment) HPI Comments: Patient with a known history colon cancer, on chemo and radiation therapy, presents with rectal bleeding for the past 2 days. No pain, fever, melena, constipation or diarrhea. She states she was started on a new chemo agent by her doctor which began 5 days ago and was told it could possibly cause rectal bleeding. She had one bowel movement 2 days ago with blood and 2 BM's yesterday, both with blood.   Patient is a 60 y.o. female presenting with hematochezia. The history is provided by the patient. No language interpreter was used.  Rectal Bleeding Quality:  Bright red Amount:  Moderate Duration:  2 days Timing:  Constant Associated symptoms: no abdominal pain, no fever and no vomiting     Past Medical History  Diagnosis Date  . Hypertension     pt states she stopped taking meds while in her 20's  . Blood in stool   . History of blood transfusion   . Anemia   . Eczema   . Heart murmur     does not see a cardiologist has been told she does has a murmur and also that she does not have a murmur  . Anxiety   . Neuromuscular disorder (HCC)     numbness feet  . Hx of radiation therapy 10/06/13,16th,19th,22nd,&10/18/13    SBRT lung  . Cancer ([place])   . Metastatic colon cancer to liver [facility])    Past Surgical History  Procedure Laterality Date  . Breast lumpectomy      left breast  . Colonoscopy  02/18/2012    Procedure: COLONOSCOPY;  Surgeon: Beryle Beams, MD;  Location: [place];  Service: Endoscopy;  Laterality: N/A;  . Portacath placement  03/14/2012    Procedure: INSERTION PORT-A-CATH;  Surgeon: Ralene Ok, MD;  Location: [place];  Service: General;  Laterality: N/A;  .  Laparoscopic sigmoid colectomy  04/11/2012    Procedure: LAPAROSCOPIC SIGMOID COLECTOMY;  Surgeon: Stark Klein, MD;  Location: [place];  Service: General;;  converted to Open with splenic flexure take down  . Open partial hepatectomy [83]  04/11/2012    Procedure: OPEN PARTIAL HEPATECTOMY [83];  Surgeon: Stark Klein, MD;  Location: [place];  Service: General;  Laterality: Right;  . Laparoscopy  04/11/2012    Procedure: LAPAROSCOPY DIAGNOSTIC;  Surgeon: Stark Klein, MD;  Location: [place];  Service: General;  Laterality: N/A;  . Cholecystectomy  04/11/2012    Procedure: CHOLECYSTECTOMY;  Surgeon: Stark Klein, MD;  Location: [place];  Service: General;;  . Ileocecetomy  04/11/2012    Procedure: Pennie Rushing;  Surgeon: Stark Klein, MD;  Location: [place];  Service: General;  Laterality: N/A;  . Port a cath revision  05/23/2012    Procedure: PORT A CATH REVISION;  Surgeon: Ralene Ok, MD;  Location: [place];  Service: General;  Laterality: Right;  Port-A-Cath repositioning versus replacement  . Abdominal hysterectomy      early 19's  . Femur im nail Right 06/15/2015    Procedure: INTRAMEDULLARY (IM) NAIL INTERTROCHANTERIC ;  Surgeon: Mcarthur Rossetti, MD;  [facility];  Service: Orthopedics;  Laterality: Right;   Family History  Problem Relation Age of Onset  . Hypertension Mother   . Cancer Mother  83    breast ca  . Heart disease Father    Social History  Substance Use Topics  . Smoking status: Never Smoker   . Smokeless tobacco: Never Used  . Alcohol Use: No   OB History    No data available     Review of Systems  Constitutional: Negative for fever and fatigue.  Respiratory: Negative for shortness of breath.   Cardiovascular: Negative for chest pain.  Gastrointestinal: Positive for blood in stool and hematochezia. Negative for nausea, vomiting, abdominal pain, diarrhea and constipation.  Genitourinary: Negative.   Musculoskeletal: Negative for myalgias.  Neurological:  Negative for syncope, weakness and headaches.      Allergies  Review of patient's allergies indicates no known allergies.  Home Medications   Prior to Admission medications   Medication Sig Start Date End Date Taking? Authorizing Provider  amLODipine (NORVASC) 10 MG tablet TAKE 1 TABLET BY MOUTH EVERY DAY Patient taking differently: Take 1 tablet by mouth every day. 05/20/15  Yes Ladell Pier, MD  diclofenac sodium (VOLTAREN) 1 % GEL Apply 2 g topically 4 (four) times daily. Patient taking differently: Apply 2 g topically 4 (four) times daily as needed (port).  05/01/15  Yes Iline Oven, MD  hydrochlorothiazide (MICROZIDE) 12.5 MG capsule Take 1 capsule (12.5 mg total) by mouth daily. 12/26/14  Yes Ladell Pier, MD  IRON PO Take 1 tablet by mouth daily.   Yes Historical Provider, MD  lidocaine-prilocaine (EMLA) cream Apply topically as needed. Apply to [facility] site 1-2 hours prior to stick and cover with plastic wrap to numb site 08/16/13  Yes Owens Shark, NP  LORazepam (ATIVAN) 0.5 MG tablet Place 1 tablet (0.5 mg total) under the tongue every 6 (six) hours as needed (for nausea). Causes drowsiness 05/08/15  Yes Owens Shark, NP  methocarbamol (ROBAXIN) 500 MG tablet Take 1 tablet (500 mg total) by mouth 2 (two) times daily. 08/04/15  Yes April Palumbo, MD  Multiple Vitamins-Minerals (MULTIVITAMIN) tablet Take 1 tablet by mouth daily. 01/22/14  Yes Ladell Pier, MD  ondansetron (ZOFRAN) 8 MG tablet TAKE 1 TABLET BY MOUTH EVERY 8 HOURS AS NEEDED FOR NAUSEA AND VOMITING 11/29/14  Yes Ladell Pier, MD  oxyCODONE-acetaminophen (PERCOCET) 5-325 MG tablet Take 1 tablet by mouth every 6 (six) hours as needed. Patient taking differently: Take 1 tablet by mouth every 6 (six) hours as needed for moderate pain or severe pain.  08/04/15  Yes April Palumbo, MD  potassium chloride SA (K-DUR,KLOR-CON) 20 MEQ tablet TAKE 1 TABLET BY MOUTH DAILY Patient taking differently: TAKE 20 MEQ BY MOUTH DAILY  07/29/15  Yes Ladell Pier, MD  prochlorperazine (COMPAZINE) 10 MG tablet TAKE 1 TABLET BY MOUTH EVERY 6 HOURS AS NEEDED FOR NAUSEA OR VOMITING 08/06/14  Yes Owens Shark, NP  regorafenib (STIVARGA) 40 MG tablet Take 3 tablets (120 mg total) by mouth daily with breakfast. For 21 days then rest for 7 days. Take with low fat meal. Caution: Chemotherapy 08/02/15  Yes Ladell Pier, MD  XARELTO 20 MG TABS tablet TAKE 1 TABLET BY MOUTH EVERY DAY Patient taking differently: TAKE 20 MG BY MOUTH EVERY DAY 06/25/15  Yes Ladell Pier, MD  loperamide (IMODIUM) 2 MG capsule TAKE ONE CAPSULE BY MOUTH AS NEEDED FOR DIARRHEA OR LOOSE STOOLS. MAX OF 8 CAPSULES PER DAY Patient not taking: Reported on 08/03/2015 05/17/15   Ladell Pier, MD  oxyCODONE-acetaminophen (PERCOCET/ROXICET) 5-325 MG tablet Take 1-2  tablets by mouth every 4 (four) hours as needed for severe pain. Patient not taking: Reported on 08/03/2015 07/12/15   Kyung Rudd, MD  polyethylene glycol powder [facility]) powder Take 17 g by mouth daily as needed. Patient not taking: Reported on 08/03/2015 06/16/12   Owens Shark, NP  temazepam (RESTORIL) 15 MG capsule Take 1 capsule (15 mg total) by mouth at bedtime as needed for sleep. Patient not taking: Reported on 08/03/2015 07/26/15   Owens Shark, NP   BP 142/95 mmHg  Pulse 95  Temp(Src) 98.1 F (36.7 C) (Oral)  Resp 18  Ht '5\' 11"'$  (1.803 m)  Wt 117.935 kg  BMI 36.28 kg/m2  SpO2 94% Physical Exam  Constitutional: She is oriented to person, place, and time. She appears well-developed and well-nourished.  HENT:  Head: Normocephalic.  Eyes:  Conjunctival pallor.  Neck: Normal range of motion. Neck supple.  Cardiovascular: Normal rate.   Pulmonary/Chest: Effort normal.  Abdominal: Soft. Bowel sounds are normal. There is no tenderness. There is no rebound and no guarding.  Genitourinary:  Grossly positive guaiac. No stool in rectal vault.  Musculoskeletal: Normal range of motion.  Neurological:  She is alert and oriented to person, place, and time. Coordination normal.  Skin: Skin is warm and dry. No rash noted.  Psychiatric: She has a normal mood and affect.    ED Course  Procedures (including critical care time) Labs Review Labs Reviewed  CBC WITH DIFFERENTIAL/PLATELET  PROTIME-INR  COMPREHENSIVE METABOLIC PANEL  POC OCCULT BLOOD, ED   Results for orders placed or performed during the hospital encounter of 08/07/15  CBC with Differential  Result Value Ref Range   WBC 11.2 (H) 4.0 - 10.5 K/uL   RBC 3.91 3.87 - 5.11 MIL/uL   Hemoglobin 11.4 (L) 12.0 - 15.0 g/dL   HCT 33.9 (L) 36.0 - 46.0 %   MCV 86.7 78.0 - 100.0 fL   MCH 29.2 26.0 - 34.0 pg   MCHC 33.6 30.0 - 36.0 g/dL   RDW 15.2 11.5 - 15.5 %   Platelets 273 150 - 400 K/uL   Neutrophils Relative % 80 %   Neutro Abs 9.0 (H) 1.7 - 7.7 K/uL   Lymphocytes Relative 12 %   Lymphs Abs 1.3 0.7 - 4.0 K/uL   Monocytes Relative 6 %   Monocytes Absolute 0.7 0.1 - 1.0 K/uL   Eosinophils Relative 2 %   Eosinophils Absolute 0.2 0.0 - 0.7 K/uL   Basophils Relative 0 %   Basophils Absolute 0.0 0.0 - 0.1 K/uL  Protime-INR  Result Value Ref Range   Prothrombin Time 23.1 (H) 11.6 - 15.2 seconds   INR 2.07 (H) 0.00 - 1.49  Comprehensive metabolic panel  Result Value Ref Range   Sodium 140 135 - 145 mmol/L   Potassium 4.3 3.5 - 5.1 mmol/L   Chloride 105 101 - 111 mmol/L   CO2 26 22 - 32 mmol/L   Glucose, Bld 129 (H) 65 - 99 mg/dL   BUN 18 6 - 20 mg/dL   Creatinine, Ser 0.89 0.44 - 1.00 mg/dL   Calcium 10.2 8.9 - 10.3 mg/dL   Total Protein 8.8 (H) 6.5 - 8.1 g/dL   Albumin 4.5 3.5 - 5.0 g/dL   AST 41 15 - 41 U/L   ALT 28 14 - 54 U/L   Alkaline Phosphatase 87 38 - 126 U/L   Total Bilirubin 0.3 0.3 - 1.2 mg/dL   GFR calc non Af Amer >60 >60 mL/min  GFR calc Af Amer >60 >60 mL/min   Anion gap 9 5 - 15  POC occult blood, ED Provider will collect  Result Value Ref Range   Fecal Occult Bld POSITIVE (A) NEGATIVE     Imaging Review No results found. I have personally reviewed and evaluated these images and lab results as part of my medical decision-making.   EKG Interpretation None      MDM   Final diagnoses:  None    1. Rectal bleeding  The patient presents with rectal bleeding that started after beginning a medication for treatment of colon cancer (metastasis to lung and bone) that carried a risk of rectal bleeding. She has had 3 bowel movements with bleeding, without bleeding in between BM's. No pain, fever. She has a normal hemoglobin, normal VS (no hypotension, tachycardia). Discussed with Dr. Betsey Holiday who feels the patient is stable for discharge home with close follow up tomorrow with Dr. Benay Spice. Discussed lab and exam findings and plan for discharge with the patient who is comfortable with plan.   No further bowel movements in the ED. She can be discharged home with strict return precautions discussed, including significant increase to quantity of blood passed per rectum, pain, fever, fatigue, near or full syncope.     Charlann Lange, PA-C 08/07/15 0315  Orpah Greek, MD 08/07/15 [id number]

## 2015-08-07 NOTE — Telephone Encounter (Signed)
Spoke with pt and she verbalizes an understanding to keep appt on 08/19/15 with Dr. Benay Spice and to call with any further bleeding or concerns.  Pt appreciative of call.

## 2015-08-07 NOTE — Discharge Instructions (Signed)

## 2015-08-12 ENCOUNTER — Telehealth: Payer: Self-pay | Admitting: *Deleted

## 2015-08-12 NOTE — Telephone Encounter (Signed)
Oncology Nurse Navigator Documentation  Oncology Nurse Navigator Flowsheets 08/12/2015  Navigator Location [facility]  Navigator Encounter Type Telephone  Telephone Outgoing Call;Patient Update  Patient Visit Type -  Treatment Phase Active Tx--Regorafenib  Barriers/Navigation Needs No Questions;No Needs;No barriers at this time  Education -  Interventions Other--wished her Happy Rudene Anda and that we are thinking of her  Education Method -  Acuity Level 1  Time Spent with Patient 15  Time Spent with Patient (Retired) -  She denies any further rectal bleeding since the day she presented to ER. Still on regorafenib and doing "OK". She will follow up on 4/24 as scheduled.

## 2015-08-19 ENCOUNTER — Encounter (HOSPITAL_COMMUNITY): Payer: Self-pay

## 2015-08-19 ENCOUNTER — Inpatient Hospital Stay (HOSPITAL_COMMUNITY): Payer: Medicare Other

## 2015-08-19 ENCOUNTER — Other Ambulatory Visit (HOSPITAL_BASED_OUTPATIENT_CLINIC_OR_DEPARTMENT_OTHER): Payer: Medicare Other

## 2015-08-19 ENCOUNTER — Encounter: Payer: Self-pay | Admitting: *Deleted

## 2015-08-19 ENCOUNTER — Ambulatory Visit (HOSPITAL_BASED_OUTPATIENT_CLINIC_OR_DEPARTMENT_OTHER): Payer: Medicare Other | Admitting: Oncology

## 2015-08-19 ENCOUNTER — Inpatient Hospital Stay (HOSPITAL_COMMUNITY)
Admission: AD | Admit: 2015-08-19 | Discharge: 2015-08-23 | DRG: 543 | Disposition: A | Discharge status: Home / Self Care | Payer: Medicare Other | Source: Ambulatory Visit | Attending: Oncology | Admitting: Oncology

## 2015-08-19 ENCOUNTER — Ambulatory Visit
Admit: 2015-08-19 | Discharge: 2015-08-19 | Disposition: A | Discharge status: Home / Self Care | Payer: Medicare Other | Attending: Radiation Oncology | Admitting: Radiation Oncology

## 2015-08-19 ENCOUNTER — Telehealth: Payer: Self-pay | Admitting: *Deleted

## 2015-08-19 VITALS — BP 132/88 | HR 102 | Temp 97.9°F | Resp 19 | Ht 71.0 in

## 2015-08-19 DIAGNOSIS — D638 Anemia in other chronic diseases classified elsewhere: Secondary | ICD-10-CM | POA: Diagnosis present

## 2015-08-19 DIAGNOSIS — K625 Hemorrhage of anus and rectum: Secondary | ICD-10-CM | POA: Insufficient documentation

## 2015-08-19 DIAGNOSIS — C182 Malignant neoplasm of ascending colon: Secondary | ICD-10-CM | POA: Diagnosis present

## 2015-08-19 DIAGNOSIS — Z8249 Family history of ischemic heart disease and other diseases of the circulatory system: Secondary | ICD-10-CM

## 2015-08-19 DIAGNOSIS — C7951 Secondary malignant neoplasm of bone: Secondary | ICD-10-CM | POA: Diagnosis present

## 2015-08-19 DIAGNOSIS — C7800 Secondary malignant neoplasm of unspecified lung: Secondary | ICD-10-CM | POA: Diagnosis present

## 2015-08-19 DIAGNOSIS — C787 Secondary malignant neoplasm of liver and intrahepatic bile duct: Secondary | ICD-10-CM | POA: Diagnosis not present

## 2015-08-19 DIAGNOSIS — Z79899 Other long term (current) drug therapy: Secondary | ICD-10-CM | POA: Diagnosis not present

## 2015-08-19 DIAGNOSIS — I10 Essential (primary) hypertension: Secondary | ICD-10-CM | POA: Insufficient documentation

## 2015-08-19 DIAGNOSIS — Z86718 Personal history of other venous thrombosis and embolism: Secondary | ICD-10-CM

## 2015-08-19 DIAGNOSIS — Z9221 Personal history of antineoplastic chemotherapy: Secondary | ICD-10-CM

## 2015-08-19 DIAGNOSIS — T45515A Adverse effect of anticoagulants, initial encounter: Secondary | ICD-10-CM | POA: Diagnosis present

## 2015-08-19 DIAGNOSIS — C7931 Secondary malignant neoplasm of brain: Secondary | ICD-10-CM

## 2015-08-19 DIAGNOSIS — Z803 Family history of malignant neoplasm of breast: Secondary | ICD-10-CM | POA: Diagnosis not present

## 2015-08-19 DIAGNOSIS — M549 Dorsalgia, unspecified: Secondary | ICD-10-CM

## 2015-08-19 DIAGNOSIS — D630 Anemia in neoplastic disease: Secondary | ICD-10-CM | POA: Diagnosis not present

## 2015-08-19 DIAGNOSIS — D6481 Anemia due to antineoplastic chemotherapy: Secondary | ICD-10-CM | POA: Diagnosis present

## 2015-08-19 DIAGNOSIS — Z85038 Personal history of other malignant neoplasm of large intestine: Secondary | ICD-10-CM | POA: Insufficient documentation

## 2015-08-19 DIAGNOSIS — C189 Malignant neoplasm of colon, unspecified: Secondary | ICD-10-CM

## 2015-08-19 DIAGNOSIS — M79604 Pain in right leg: Secondary | ICD-10-CM | POA: Diagnosis present

## 2015-08-19 DIAGNOSIS — K649 Unspecified hemorrhoids: Secondary | ICD-10-CM | POA: Diagnosis present

## 2015-08-19 DIAGNOSIS — G629 Polyneuropathy, unspecified: Secondary | ICD-10-CM | POA: Diagnosis present

## 2015-08-19 DIAGNOSIS — Z923 Personal history of irradiation: Secondary | ICD-10-CM

## 2015-08-19 DIAGNOSIS — C187 Malignant neoplasm of sigmoid colon: Secondary | ICD-10-CM

## 2015-08-19 DIAGNOSIS — C785 Secondary malignant neoplasm of large intestine and rectum: Secondary | ICD-10-CM | POA: Insufficient documentation

## 2015-08-19 DIAGNOSIS — M8448XA Pathological fracture, other site, initial encounter for fracture: Secondary | ICD-10-CM | POA: Diagnosis present

## 2015-08-19 DIAGNOSIS — C7949 Secondary malignant neoplasm of other parts of nervous system: Secondary | ICD-10-CM

## 2015-08-19 DIAGNOSIS — Z510 Encounter for antineoplastic radiation therapy: Secondary | ICD-10-CM | POA: Insufficient documentation

## 2015-08-19 DIAGNOSIS — Z7901 Long term (current) use of anticoagulants: Secondary | ICD-10-CM | POA: Diagnosis not present

## 2015-08-19 DIAGNOSIS — I82890 Acute embolism and thrombosis of other specified veins: Secondary | ICD-10-CM | POA: Insufficient documentation

## 2015-08-19 DIAGNOSIS — D500 Iron deficiency anemia secondary to blood loss (chronic): Secondary | ICD-10-CM | POA: Insufficient documentation

## 2015-08-19 LAB — COMPREHENSIVE METABOLIC PANEL
ALBUMIN: 3.7 g/dL (ref 3.5–5.0)
ALK PHOS: 98 U/L (ref 40–150)
ALT: 36 U/L (ref 0–55)
ANION GAP: 12 meq/L — AB (ref 3–11)
AST: 55 U/L — AB (ref 5–34)
BUN: 19.4 mg/dL (ref 7.0–26.0)
CALCIUM: 10.5 mg/dL — AB (ref 8.4–10.4)
CO2: 23 mEq/L (ref 22–29)
Chloride: 103 mEq/L (ref 98–109)
Creatinine: 0.9 mg/dL (ref 0.6–1.1)
EGFR: 82 mL/min/{1.73_m2} — AB (ref >90)
Glucose: 92 mg/dl (ref 70–140)
POTASSIUM: 3.8 meq/L (ref 3.5–5.1)
Sodium: 139 mEq/L (ref 136–145)
Total Bilirubin: 0.56 mg/dL (ref 0.20–1.20)
Total Protein: 8.3 g/dL (ref 6.4–8.3)

## 2015-08-19 LAB — CBC WITH DIFFERENTIAL/PLATELET
BASO%: 0.8 % (ref 0.0–2.0)
BASOS ABS: 0.1 10*3/uL (ref 0.0–0.1)
EOS ABS: 0.2 10*3/uL (ref 0.0–0.5)
EOS%: 1.9 % (ref 0.0–7.0)
HEMATOCRIT: 33.6 % — AB (ref 34.8–46.6)
HEMOGLOBIN: 10.7 g/dL — AB (ref 11.6–15.9)
LYMPH#: 1.3 10*3/uL (ref 0.9–3.3)
LYMPH%: 10.9 % — ABNORMAL LOW (ref 14.0–49.7)
MCH: 28 pg (ref 25.1–34.0)
MCHC: 31.9 g/dL (ref 31.5–36.0)
MCV: 87.7 fL (ref 79.5–101.0)
MONO#: 1 10*3/uL — AB (ref 0.1–0.9)
MONO%: 7.8 % (ref 0.0–14.0)
NEUT#: 9.7 10*3/uL — ABNORMAL HIGH (ref 1.5–6.5)
NEUT%: 78.6 % — AB (ref 38.4–76.8)
PLATELETS: 339 10*3/uL (ref 145–400)
RBC: 3.83 10*6/uL (ref 3.70–5.45)
RDW: 16.7 % — AB (ref 11.2–14.5)
WBC: 12.3 10*3/uL — ABNORMAL HIGH (ref 3.9–10.3)

## 2015-08-19 MED ORDER — POLYETHYLENE GLYCOL 3350 17 G PO PACK
17.0000 g | PACK | Freq: Every day | ORAL | Status: DC | PRN
Start: 2015-08-19 — End: 2015-08-23

## 2015-08-19 MED ORDER — AMLODIPINE BESYLATE 10 MG PO TABS
10.0000 mg | ORAL_TABLET | Freq: Every day | ORAL | Status: DC
  Administered 2015-08-19 – 2015-08-23 (×5): 10 mg via ORAL
  Filled 2015-08-19 (×5): qty 1

## 2015-08-19 MED ORDER — RIVAROXABAN 20 MG PO TABS
20.0000 mg | ORAL_TABLET | Freq: Every day | ORAL | Status: DC
  Administered 2015-08-19: 20 mg via ORAL
  Filled 2015-08-19: qty 1

## 2015-08-19 MED ORDER — HYDROMORPHONE HCL 4 MG/ML IJ SOLN
2.0000 mg | Freq: Once | INTRAMUSCULAR | Status: AC
  Administered 2015-08-19: 2 mg via INTRAVENOUS

## 2015-08-19 MED ORDER — LIDOCAINE-PRILOCAINE 2.5-2.5 % EX CREA: TOPICAL_CREAM | CUTANEOUS | Status: DC | PRN

## 2015-08-19 MED ORDER — LORAZEPAM 0.5 MG PO TABS: 0.5000 mg | ORAL_TABLET | Freq: Four times a day (QID) | ORAL | Status: DC | PRN

## 2015-08-19 MED ORDER — SODIUM CHLORIDE 0.9 % IV SOLN
INTRAVENOUS | Status: DC
  Administered 2015-08-19: 16:00:00 via INTRAVENOUS

## 2015-08-19 MED ORDER — RIVAROXABAN 20 MG PO TABS: 20.0000 mg | ORAL_TABLET | Freq: Every day | ORAL | Status: DC

## 2015-08-19 MED ORDER — IOPAMIDOL (ISOVUE-300) INJECTION 61%
75.0000 mL | Freq: Once | INTRAVENOUS | Status: AC | PRN
  Administered 2015-08-19: 75 mL via INTRAVENOUS

## 2015-08-19 MED ORDER — ONDANSETRON HCL 8 MG PO TABS
8.0000 mg | ORAL_TABLET | Freq: Three times a day (TID) | ORAL | Status: DC | PRN
Start: 2015-08-19 — End: 2015-08-23

## 2015-08-19 MED ORDER — ZOLPIDEM TARTRATE 5 MG PO TABS: 5.0000 mg | ORAL_TABLET | Freq: Every evening | ORAL | Status: DC | PRN

## 2015-08-19 MED ORDER — HYDROMORPHONE HCL 1 MG/ML IJ SOLN
1.0000 mg | INTRAMUSCULAR | Status: DC | PRN
  Administered 2015-08-19: 1 mg via INTRAVENOUS
  Administered 2015-08-20 – 2015-08-23 (×8): 2 mg via INTRAVENOUS
  Filled 2015-08-19 (×7): qty 2
  Filled 2015-08-19: qty 1
  Filled 2015-08-19 (×2): qty 2

## 2015-08-19 MED ORDER — HYDROMORPHONE HCL 4 MG/ML IJ SOLN
INTRAMUSCULAR | Status: AC
  Filled 2015-08-19: qty 1

## 2015-08-19 MED ORDER — POTASSIUM CHLORIDE CRYS ER 20 MEQ PO TBCR
20.0000 meq | EXTENDED_RELEASE_TABLET | Freq: Every day | ORAL | Status: DC
  Administered 2015-08-19 – 2015-08-23 (×5): 20 meq via ORAL
  Filled 2015-08-19 (×5): qty 1

## 2015-08-19 MED ORDER — DEXAMETHASONE SODIUM PHOSPHATE 4 MG/ML IJ SOLN
8.0000 mg | Freq: Two times a day (BID) | INTRAMUSCULAR | Status: DC
  Administered 2015-08-19 – 2015-08-21 (×4): 8 mg via INTRAVENOUS
  Filled 2015-08-19 (×4): qty 2

## 2015-08-19 NOTE — Progress Notes (Signed)
[name]  Clinical Social [name] was referred by patient for assessment of psychosocial needs due to transportation concern.  Pt phoned pt stating she had issues with [facility] and needed another pass. CClinical Education officer, museum met with patient at [facility] in exam room to offer support and assess for needs.  Pt tearful and in a lot of pain this am. CSW provided support, medical team came in to assess pt. CSW aware of admission. CSW to follow up and support pt as appropriate. CSW concerned as pt resides in townhouse with her bedroom and only bathroom being on the second floor. Pt was able to manage after last admission independently after discharge. CSW to check in with pt on Wed, when this CSW back on this campus.     Clinical Social [name] interventions: Emotional support and resource asst  [name], [place] Worker [place]  [place] Phone: [phone] Fax: [phone]

## 2015-08-19 NOTE — Progress Notes (Signed)
1040-Pt states that pain is decreasing and is down to a "7" at this time.  1100-Pt vomited x1.  Pt states relief of nausea with vomiting.  1130-Report called to [place] on [address]west.  Pt escorted via w/c to room by V. Analleli Gierke RN.

## 2015-08-19 NOTE — Telephone Encounter (Signed)
Called the floor room 1332 asked to speak with patient's RN, was told she was at lunch and transferred the call to Heather,RN,informed  that patient would be brought down for CT Simulation for her T-Spine at 2pm, will have transporters bring her down in her bed, Nira Conn will relay the message to the Rn , patient is in Radiology for ct chest at present, called and spoke with Joellen Jersey RT Therapist of status 1:21 PM

## 2015-08-19 NOTE — H&P (Signed)
Patient History and Physical   [name] [id number] [birth year] 60 y.o. 08/19/2015    Patient Identification:  [name] is a 60 year old with metastatic colon cancer. She is now admitted with severe back pain.  HPI:   [name] was diagnosed with metastatic colon cancer in December 2013. She has been treated with surgery , multiple systemic therapies, and radiation. She completed a course of radiation to the right femur 07/19/2015. She began a first cycle of salvage therapy with regorafenib 08/02/2015. No mouth sores, diarrhea, or rash with the regorafenib.  [name] was seen in the emergency room with rectal bleeding on 08/07/2015. She continues to have intermittent rectal bleeding. She reports pain at the upper back for the past several weeks. The pain has become intense and is not relieved with Dilaudid. The pain radiates to the left posterior lateral chest. She presented to the office today for a routine appointment  andwas noted to have severe pain.  PMH:  Past Medical History  Diagnosis Date  . Hypertension     pt states she stopped taking meds while in her 20's  . Blood in stool   . History of blood transfusion   . Anemia   . Eczema   . Heart murmur     does not see a cardiologist has been told she does has a murmur and also that she does not have a murmur  . Anxiety   . Neuromuscular disorder (HCC)     numbness feet  . Hx of radiation therapy 10/06/13,16th,19th,22nd,&10/18/13    SBRT lung  . Cancer ([place])   . Metastatic colon cancer to liver [facility])     Past Surgical History  Procedure Laterality Date  . Breast lumpectomy      left breast  . Colonoscopy  02/18/2012    Procedure: COLONOSCOPY;  Surgeon: Beryle Beams, MD;  Location: [place];  Service: Endoscopy;  Laterality: N/A;  . Portacath placement  03/14/2012    Procedure: INSERTION PORT-A-CATH;  Surgeon: Ralene Ok, MD;  Location: [place];  Service: General;  Laterality: N/A;  . Laparoscopic sigmoid  colectomy  04/11/2012    Procedure: LAPAROSCOPIC SIGMOID COLECTOMY;  Surgeon: Stark Klein, MD;  Location: [place];  Service: General;;  converted to Open with splenic flexure take down  . Open partial hepatectomy [83]  04/11/2012    Procedure: OPEN PARTIAL HEPATECTOMY [83];  Surgeon: Stark Klein, MD;  Location: [place];  Service: General;  Laterality: Right;  . Laparoscopy  04/11/2012    Procedure: LAPAROSCOPY DIAGNOSTIC;  Surgeon: Stark Klein, MD;  Location: [place];  Service: General;  Laterality: N/A;  . Cholecystectomy  04/11/2012    Procedure: CHOLECYSTECTOMY;  Surgeon: Stark Klein, MD;  Location: [place];  Service: General;;  . Ileocecetomy  04/11/2012    Procedure: Pennie Rushing;  Surgeon: Stark Klein, MD;  Location: [place];  Service: General;  Laterality: N/A;  . Port a cath revision  05/23/2012    Procedure: PORT A CATH REVISION;  Surgeon: Ralene Ok, MD;  Location: [place];  Service: General;  Laterality: Right;  Port-A-Cath repositioning versus replacement  . Abdominal hysterectomy      early 39's  . Femur im nail Right 06/15/2015    Procedure: INTRAMEDULLARY (IM) NAIL INTERTROCHANTERIC ;  Surgeon: Mcarthur Rossetti, MD;  [facility];  Service: Orthopedics;  Laterality: Right;    Allergies:  No Known Allergies  Medications:  Medications Prior to Admission  Medication Sig Dispense Refill  . amLODipine (NORVASC) 10  MG tablet TAKE 1 TABLET BY MOUTH EVERY DAY (Patient taking differently: Take 1 tablet by mouth every day.) 90 tablet 0  . diclofenac sodium (VOLTAREN) 1 % GEL Apply 2 g topically 4 (four) times daily. (Patient taking differently: Apply 2 g topically 4 (four) times daily as needed (port). ) 100 g 3  . hydrochlorothiazide (MICROZIDE) 12.5 MG capsule Take 1 capsule (12.5 mg total) by mouth daily. 30 capsule 1  . HYDROmorphone (DILAUDID) 2 MG tablet Take 2 mg by mouth every 4 (four) hours as needed for severe pain. Take 1-2 tabs q 4-6 hrs-PRN    . IRON PO Take 1  tablet by mouth daily.    Marland Kitchen lidocaine-prilocaine (EMLA) cream Apply topically as needed. Apply to [facility] site 1-2 hours prior to stick and cover with plastic wrap to numb site 30 g 3  . loperamide (IMODIUM) 2 MG capsule TAKE ONE CAPSULE BY MOUTH AS NEEDED FOR DIARRHEA OR LOOSE STOOLS. MAX OF 8 CAPSULES PER DAY 30 capsule 1  . LORazepam (ATIVAN) 0.5 MG tablet Place 1 tablet (0.5 mg total) under the tongue every 6 (six) hours as needed (for nausea). Causes drowsiness 30 tablet 0  . methocarbamol (ROBAXIN) 500 MG tablet Take 1 tablet (500 mg total) by mouth 2 (two) times daily. 20 tablet 0  . Multiple Vitamins-Minerals (MULTIVITAMIN) tablet Take 1 tablet by mouth daily.    . ondansetron (ZOFRAN) 8 MG tablet TAKE 1 TABLET BY MOUTH EVERY 8 HOURS AS NEEDED FOR NAUSEA AND VOMITING 20 tablet 2  . oxyCODONE-acetaminophen (PERCOCET/ROXICET) 5-325 MG tablet Take 1-2 tablets by mouth every 4 (four) hours as needed for severe pain. (Patient not taking: Reported on 08/03/2015) 60 tablet 0  . polyethylene glycol powder (MIRALAX) powder Take 17 g by mouth daily as needed. 255 g 0  . potassium chloride SA (K-DUR,KLOR-CON) 20 MEQ tablet TAKE 1 TABLET BY MOUTH DAILY (Patient taking differently: TAKE 20 MEQ BY MOUTH DAILY) 90 tablet 1  . prochlorperazine (COMPAZINE) 10 MG tablet TAKE 1 TABLET BY MOUTH EVERY 6 HOURS AS NEEDED FOR NAUSEA OR VOMITING 30 tablet 1  . regorafenib (STIVARGA) 40 MG tablet Take 3 tablets (120 mg total) by mouth daily with breakfast. For 21 days then rest for 7 days. Take with low fat meal. Caution: Chemotherapy 84 tablet 0  . temazepam (RESTORIL) 15 MG capsule Take 1 capsule (15 mg total) by mouth at bedtime as needed for sleep. 30 capsule 0  . XARELTO 20 MG TABS tablet TAKE 1 TABLET BY MOUTH EVERY DAY (Patient taking differently: TAKE 20 MG BY MOUTH EVERY DAY) 30 tablet 1    Social History:    reports that she has never smoked. She has never used smokeless tobacco. She reports that she does not  drink alcohol or use illicit drugs. she lives alone.  Family History:  Family History  Problem Relation Age of Onset  . Hypertension Mother   . Cancer Mother 3    breast ca  . Heart disease Father     Review of Systems:  Positives include: severe pain at the left upper back and lateral chest , pain and weakness at the right leg  A complete ROS was otherwise negative.   Physical Exam:  Blood pressure 114/62, pulse 88, temperature 98.3 F (36.8 C), temperature source Axillary, resp. rate 18, SpO2 94 %.  HEENT:  No thrush or ulcers Lungs:  Clear bilaterally Cardiac:  Regular rate and rhythm Abdomen:  No hepatomegaly, nontender  Vascular:  No leg edema Neurologic:  Alert and oriented, the motor exam appears intact in the upper extremities bilaterally. The left leg and foot strength appears intact. Mild weakness with flexion at the right hip Skin:  No rash Musculoskeletal:  No spine tenderness or mass  Lab Results:  Lab Results  Component Value Date   WBC 12.3* 08/19/2015   HGB 10.7* 08/19/2015   HCT 33.6* 08/19/2015   MCV 87.7 08/19/2015   PLT 339 08/19/2015   NEUTROABS 9.7* 08/19/2015   potassium 3.8, crit 0.9, calcium 10.5   Impression and Plan:  1.   Metastatic colon cancer diagnosed in December 2013 , currently being treated with salvage regorafenib, first cycle initiated 08/02/2015  2.    Right femur metastasis, status post intramedullary nail 06/15/2015   Palliative radiation to the right femur and right pelvis 07/14/2015 through 07/19/2015  3.    Hypertension  4.    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     Rectal bleeding-likely secondary to Xarelto  6.     pain-most likely secondary to metastatic colon cancer involving the spine, a lytic lesion at T6 was noted on a CT 06/10/2015   7.      Mild elevation of the calcium- repeat in a few days    [name] presents with severe pain at the mid back. The  pain is most likely secondary to progression of a lytic metastasis at T6. She will be admitted for pain control. We will continue Dilaudid and add Decadron. I discussed the case with Dr. Lisbeth Renshaw. He will see her today. She will be scheduled for a CT of the chest. We will order an MRI as indicated pending the CT findings.     Betsy Coder, MD  08/19/2015, 2:00 PM

## 2015-08-19 NOTE — Progress Notes (Signed)
  Oncology Nurse Navigator Documentation  Navigator Location: [facility] (08/19/15 1206) Navigator Encounter Type: Follow-up Appt (08/19/15 1206)           Patient Visit Type: MedOnc (08/19/15 1206) Treatment Phase: Active Tx;Follow-up (08/19/15 1206) Barriers/Navigation Needs: Family concerns (08/19/15 1206) Education: Pain/ Symptom Management (08/19/15 1206) Interventions: Coordination of Care (08/19/15 1206)   Coordination of Care: Other (08/19/15 1206)   Assisted collaborative nurse by calling bed control for admission to [place] for pain control. Also called daughter to let her know reason for admission and room #. Requested she also call her brother.     Acuity: Level 2 (08/19/15 1206)         Time Spent with Patient: 30 (08/19/15 1206)

## 2015-08-19 NOTE — Progress Notes (Signed)
See dictated history and physical 08/19/2015  Betsy Coder, MD  08/19/2015  9:11 AM

## 2015-08-19 NOTE — Progress Notes (Signed)
RN received call from [facility] radiology regarding CT scan results. MD paged and alerted to results.

## 2015-08-20 ENCOUNTER — Telehealth: Payer: Self-pay | Admitting: *Deleted

## 2015-08-20 ENCOUNTER — Encounter: Payer: Self-pay | Admitting: Radiation Therapy

## 2015-08-20 ENCOUNTER — Inpatient Hospital Stay (HOSPITAL_COMMUNITY): Payer: Medicare Other

## 2015-08-20 ENCOUNTER — Ambulatory Visit
Admit: 2015-08-20 | Discharge: 2015-08-20 | Disposition: A | Discharge status: Home / Self Care | Payer: Medicare Other | Attending: Radiation Oncology | Admitting: Radiation Oncology

## 2015-08-20 DIAGNOSIS — C787 Secondary malignant neoplasm of liver and intrahepatic bile duct: Secondary | ICD-10-CM

## 2015-08-20 DIAGNOSIS — C189 Malignant neoplasm of colon, unspecified: Secondary | ICD-10-CM

## 2015-08-20 DIAGNOSIS — C7951 Secondary malignant neoplasm of bone: Secondary | ICD-10-CM

## 2015-08-20 MED ORDER — LORAZEPAM 2 MG/ML IJ SOLN
1.0000 mg | Freq: Once | INTRAMUSCULAR | Status: AC
  Administered 2015-08-20: 1 mg via INTRAVENOUS
  Filled 2015-08-20: qty 1

## 2015-08-20 MED ORDER — HYDROMORPHONE HCL 4 MG PO TABS
4.0000 mg | ORAL_TABLET | ORAL | Status: DC | PRN
  Administered 2015-08-20 – 2015-08-22 (×2): 4 mg via ORAL
  Filled 2015-08-20 (×3): qty 1

## 2015-08-20 NOTE — Telephone Encounter (Signed)
Returned call to patient's room concerned about SRS tx on Friday, wondered if it was different than her other rad treatments  That she has  Had before, Informed her it is a 1 time [facility] treatment  For 45 minutes, she will be lying on the linac table for 45 minutes and if she needs something for pain to take before her treatment, , it is a little different but she shouldn't have any pain with that, patient thanked me for calling and returning her call, she can speak with MD and on Friday to get more clarification 2:45 PM  2:44 PM

## 2015-08-20 NOTE — Consult Note (Signed)
Radiation Oncology         [phone] ________________________________  Name: Dalma Panchal MRN: [id number]  Date: 08/19/2015  DOB: [birth year]  CC:No PCP Per Patient  No ref. provider found     REFERRING PHYSICIAN: No ref. 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 Diagnoses of Back pain, Colon cancer metastasized to lung [facility]), Cancer of ascending colon ([place]), and Bone metastasis ([place]) were also pertinent to this visit.   HISTORY OF PRESENT ILLNESS: Ivalene Platte is a 60 y.o. female seen at the request of Dr. Benay Spice for a pathologic fracture at T6 causing significant pain. The patient is well-known to our service and has received radiation for SBRT to the  Left lower lung, right upper lobe and low right lower lobe which she completed in June 2015, followed by treatment of bony metastases in the right femur and pelvis which she completed most recently between 07/14/2015 and 07/19/2015. She was seen yesterday and admitted for pain crisis complaining of mid back pain. A CT of the chest revealed interval progression of pulmonary metastases, a new right adrenal gland nodule concerning for disease, and interval pathologic fracture T6 vertebral containing a large lytic lesion. Because of the findings in the thoracic spine, we are asked to see the patient for consideration of radiotherapy  PREVIOUS RADIATION THERAPY: Yes   07/14/2015- 07/19/2015:  30 Gy to the right femur and pelvis  10/06/13-10/18/13: 50 Gy to the left lower lobe, right lower lobe, and right upper lobe of the lung  PAST MEDICAL HISTORY:  Past Medical History  Diagnosis Date  . Hypertension     pt states she stopped taking meds while in her 20's  . Blood in stool   . History of blood transfusion   . Anemia   . Eczema   . Heart murmur     does not see a cardiologist has been told she does has a murmur and also that she does not have a murmur  .  Anxiety   . Neuromuscular disorder (HCC)     numbness feet  . Hx of radiation therapy 10/06/13,16th,19th,22nd,&10/18/13    SBRT lung  . Cancer ([place])   . Metastatic colon cancer to liver [facility])        PAST SURGICAL HISTORY: Past Surgical History  Procedure Laterality Date  . Breast lumpectomy      left breast  . Colonoscopy  02/18/2012    Procedure: COLONOSCOPY;  Surgeon: Beryle Beams, MD;  Location: [place];  Service: Endoscopy;  Laterality: N/A;  . Portacath placement  03/14/2012    Procedure: INSERTION PORT-A-CATH;  Surgeon: Ralene Ok, MD;  Location: [place];  Service: General;  Laterality: N/A;  . Laparoscopic sigmoid colectomy  04/11/2012    Procedure: LAPAROSCOPIC SIGMOID COLECTOMY;  Surgeon: Stark Klein, MD;  Location: [place];  Service: General;;  converted to Open with splenic flexure take down  . Open partial hepatectomy [83]  04/11/2012    Procedure: OPEN PARTIAL HEPATECTOMY [83];  Surgeon: Stark Klein, MD;  Location: [place];  Service: General;  Laterality: Right;  . Laparoscopy  04/11/2012    Procedure: LAPAROSCOPY DIAGNOSTIC;  Surgeon: Stark Klein, MD;  Location: [place];  Service: General;  Laterality: N/A;  . Cholecystectomy  04/11/2012    Procedure: CHOLECYSTECTOMY;  Surgeon: Stark Klein, MD;  Location: [place];  Service: General;;  . Ileocecetomy  04/11/2012    Procedure: Pennie Rushing;  Surgeon: Stark Klein, MD;  Location: [place];  Service: General;  Laterality: N/A;  . Port a cath revision  05/23/2012    Procedure: PORT A CATH REVISION;  Surgeon: Ralene Ok, MD;  Location: [place];  Service: General;  Laterality: Right;  Port-A-Cath repositioning versus replacement  . Abdominal hysterectomy      early 104's  . Femur im nail Right 06/15/2015    Procedure: INTRAMEDULLARY (IM) NAIL INTERTROCHANTERIC ;  Surgeon: Mcarthur Rossetti, MD;  [facility];  Service: Orthopedics;  Laterality: Right;     FAMILY HISTORY:  Family History  Problem Relation Age  of Onset  . Hypertension Mother   . Cancer Mother 60    breast ca  . Heart disease Father      SOCIAL HISTORY:  reports that she has never smoked. She has never used smokeless tobacco. She reports that she does not drink alcohol or use illicit drugs.   ALLERGIES: Review of patient's allergies indicates no known allergies.   MEDICATIONS:  Current Facility-Administered Medications  Medication Dose Route Frequency Provider Last Rate Last Dose  . 0.9 %  sodium chloride infusion   Intravenous Continuous Ladell Pier, MD 20 mL/hr at 08/19/15 1556    . amLODipine (NORVASC) tablet 10 mg  10 mg Oral Daily Minda Ditto, RPH   10 mg at 08/20/15 0951  . dexamethasone (DECADRON) injection 8 mg  8 mg Intravenous Q12H Ladell Pier, MD   8 mg at 08/20/15 0332  . HYDROmorphone (DILAUDID) injection 1-2 mg  1-2 mg Intravenous Q2H PRN Ladell Pier, MD   2 mg at 08/20/15 0327  . HYDROmorphone (DILAUDID) tablet 4 mg  4 mg Oral Q4H PRN Ladell Pier, MD      . lidocaine-prilocaine (EMLA) cream   Topical PRN Ladell Pier, MD      . LORazepam (ATIVAN) tablet 0.5 mg  0.5 mg Sublingual Q6H PRN Ladell Pier, MD      . ondansetron [facility]) tablet 8 mg  8 mg Oral Q8H PRN Ladell Pier, MD      . polyethylene glycol (MIRALAX / GLYCOLAX) packet 17 g  17 g Oral Daily PRN Ladell Pier, MD      . potassium chloride SA (K-DUR,KLOR-CON) CR tablet 20 mEq  20 mEq Oral Daily Ladell Pier, MD   20 mEq at 08/20/15 0951  . zolpidem (AMBIEN) tablet 5 mg  5 mg Oral QHS PRN Ladell Pier, MD         REVIEW OF SYSTEMS: On review of systems, the patient states That in the last month, her pain has become much more noticeable in the thoracic spine. She denies any particular injury, and states that she is not experiencing any numbness or tingling of her extremities. She denies any chest wall pain. She is not experiencing any nausea or vomiting. She states that her pain medicine that she is receiving does  make her very groggy. She is very tired today. She denies any shortness of breath or chest pain, fevers or chills. A complete review of systems is obtained and is otherwise negative.    PHYSICAL EXAM:  height is '5\' 11"'$  (1.803 m) and weight is 244 lb 14.4 oz (111.086 kg). Her oral temperature is 98.4 F (36.9 C). Her blood pressure is 141/74 and her pulse is 78. Her respiration is 18 and oxygen saturation is 99%.   Pain scale 10/10 In general this is a uncomfortable appearing African-American female in no acute  distress. She's alert and oriented x4 and appropriate throughout the examination. Cardiopulmonary assessment is negative for acute distress and she exhibits normal effort. Pain is noted with light palpation over the thoracic spine. Lower extremities are negative for pretibial pitting edema deep calf tenderness cyanosis or clubbing.    ECOG = 2  0 - Asymptomatic (Fully active, able to carry on all predisease activities without restriction)  1 - Symptomatic but completely ambulatory (Restricted in physically strenuous activity but ambulatory and able to carry out work of a light or sedentary nature. For example, light housework, office work)  2 - Symptomatic, <50% in bed during the day (Ambulatory and capable of all self care but unable to carry out any work activities. Up and about more than 50% of waking hours)  3 - Symptomatic, >50% in bed, but not bedbound (Capable of only limited self-care, confined to bed or chair 50% or more of waking hours)  4 - Bedbound (Completely disabled. Cannot carry on any self-care. Totally confined to bed or chair)  5 - Death   Eustace Pen MM, Creech RH, Tormey DC, et al. [id number]). "Toxicity and response criteria of the [facility]". [place] Oncol. 5 (6): 649-55    LABORATORY DATA:  Lab Results  Component Value Date   WBC 12.3* 08/19/2015   HGB 10.7* 08/19/2015   HCT 33.6* 08/19/2015   MCV 87.7 08/19/2015   PLT 339 08/19/2015     Lab Results  Component Value Date   NA 139 08/19/2015   K 3.8 08/19/2015   CL 105 08/07/2015   CO2 23 08/19/2015   Lab Results  Component Value Date   ALT 36 08/19/2015   AST 55* 08/19/2015   ALKPHOS 98 08/19/2015   BILITOT 0.56 08/19/2015      RADIOGRAPHY: Dg Lumbar Spine Complete  08/04/2015  CLINICAL DATA:  Lumbosacral back pain. History metastatic colon cancer with right pelvic bone metastases. EXAM: LUMBAR SPINE - COMPLETE 4+ VIEW COMPARISON:  CT 06/10/2015 FINDINGS: The alignment is maintained. Vertebral body heights are normal. There is no listhesis. The posterior elements are intact. There is facet arthropathy in the lower lumbar spine. Disc space narrowing at L4-L5 and L5-S1. No evidence of lumbar spine fracture. The known right iliac lesions are not well seen, only partially included in the field of view. No radiographic evidence of focal bone lesion. 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 Patient with history of metastasis to bone, no focal lesion seen radiographically. If there is clinical concern for lumbar spine metastasis, recommend further evaluation with MRI, acuity based on clinical symptomatology. Electronically Signed   By: Jeb Levering M.D.   On: 08/04/2015 00:02   Ct Chest W Contrast  08/19/2015  CLINICAL DATA:  Metastatic colon cancer. EXAM: CT CHEST WITH CONTRAST TECHNIQUE: Multidetector CT imaging of the chest was performed during intravenous contrast administration. CONTRAST:  72m ISOVUE-300 IOPAMIDOL (ISOVUE-300) INJECTION 61% COMPARISON:  06/10/2015 FINDINGS: Mediastinum: The heart size appears normal. Aortic atherosclerosis noted. The trachea appears patent and is midline. Unremarkable appearance of the esophagus. No mediastinal or hilar adenopathy. Lungs/Pleura: No pleural fluid. Multiple pulmonary masses are again identified. Index lesion within the left upper lobe measures 5.1 x 3.7 cm, image 46 of series 5.  Previously 4.1 x 3.1 cm. Index lesion within the superior segment of left lower lobe measures 2.6 x 1.9 cm, image 56 of series 5. Previously 1.7 x 1.4 cm. There is an index lesion within  the right middle lobe which measures 4.4 by 3.9 cm, image 73 of series 5. Previously 3.4 x 3.2 cm. Multiple new small nodules are identified in both lungs. For example, there are two new nodules are identified in the right lower lobe, image 87 of series 5. Upper Abdomen: No suspicious liver abnormality identified. Normal appearance of the spleen. The left adrenal gland is normal. New nodule in the right adrenal gland measures 1.8 by 1.5 cm, image number 88 of series 2. Musculoskeletal: Interval fracture of the T6 vertebra containing large lytic lesion, image 39 of series 2. IMPRESSION: 1. Interval progression of pulmonary metastasis. 2. New right adrenal gland nodule worrisome for metastatic disease 3. Interval pathologic fracture of T6 vertebra which contains a large lytic lesion. These results will be called to the ordering clinician or representative by the Radiologist Assistant, and communication documented in the PACS or zVision Dashboard. Electronically Signed   By: Kerby Moors M.D.   On: 08/19/2015 14:43   Ct Renal Stone Study  08/04/2015  CLINICAL DATA:  Worsening back pain. EXAM: CT ABDOMEN AND PELVIS WITHOUT CONTRAST TECHNIQUE: Multidetector CT imaging of the abdomen and pelvis was performed following the standard protocol without IV contrast. COMPARISON:  06/10/2015 FINDINGS: Lower chest: There are new pulmonary nodules in the lung bases, numbering at least 4 in the right lower lobe posteriorly and at least 1 on the left. These measure up to 7 mm, and likely represent new hematogenous metastases. The right middle lobe mass is incompletely imaged on this study. Hepatobiliary: Stable morphologic irregularities of the liver consistent with partial hepatectomy. No focal liver lesions are evident on this unenhanced scan.  Pancreas: Negative for significant abnormality. Spleen: Negative significant abnormality. Adrenals/Urinary Tract: Adrenals and kidneys appear unremarkable. No urinary calculi. Ureters and urinary bladder are unremarkable. Stomach/Bowel: Colonic resections with unremarkable appearances of the anastomoses. Small bowel is unremarkable. Stomach is unremarkable. Vascular/Lymphatic: The abdominal aorta is normal in caliber. There is mild atherosclerotic calcification. There is no adenopathy in the abdomen or pelvis. Reproductive: Hysterectomy.  No adnexal abnormalities. Other: No acute inflammatory changes are evident in the abdomen or pelvis. There is no adenopathy. There is no ascites. Musculoskeletal: Multiple skeletal metastases are again evident, including the left sacrum, right iliac crest and right supra-acetabular ischium. These are not convincingly different from 06/10/2015. IMPRESSION: 1. No acute findings are evident in the abdomen or pelvis. 2. New pulmonary nodules in the lung bases bilaterally. 3. Multiple skeletal metastases, including the left sacrum as well as the right hemipelvis, not significantly changed. Electronically Signed   By: Andreas Newport M.D.   On: 08/04/2015 03:01       IMPRESSION:  Metastatic colon cancer to the thoracic spine, T6 with fracture causing significant pain   PLAN: We discussed the role of stereotactic radiosurgery to the thoracic spine at the T6 position, and have recommended proceeding with this and simulation during her hospitalization. Dr. Lisbeth Renshaw discusses the role for this not only to stabilize the vertebral body but also to treat her pain. Dr. Lisbeth Renshaw discusses that he would recommend a total of 3 fractions to the thoracic spine, and we would plan for simulation today. The patient is interested in this. Written consent is obtained, we did discuss the risks, benefits, short and long-term effects of his treatment. Patient is interested in moving forward, and we will  accordingly begin planning.     Carola Rhine, PAC

## 2015-08-20 NOTE — Consult Note (Signed)
Reason for Consult: Spine metastasis Referring Physician: Izzah Pasqua is an 60 y.o. female.  HPI: 60 year old female with metastatic adenocarcinoma the colon. Patient presents with worsening back pain. Workup demonstrates a pathologic fracture of T6 with lytic tumor deposit within the body of T6. No obvious epidural spread. Patient being evaluated for possible SRS treatment. Back pain much better today. No radicular pain. 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 Patient ambulating well. Bowel and bladder function intact.  Past Medical History  Diagnosis Date  . Hypertension     pt states she stopped taking meds while in her 20's  . Blood in stool   . History of blood transfusion   . Anemia   . Eczema   . Heart murmur     does not see a cardiologist has been told she does has a murmur and also that she does not have a murmur  . Anxiety   . Neuromuscular disorder (HCC)     numbness feet  . Hx of radiation therapy 10/06/13,16th,19th,22nd,&10/18/13    SBRT lung  . Cancer ([place])   . Metastatic colon cancer to liver [facility])     Past Surgical History  Procedure Laterality Date  . Breast lumpectomy      left breast  . Colonoscopy  02/18/2012    Procedure: COLONOSCOPY;  Surgeon: Beryle Beams, MD;  Location: [place];  Service: Endoscopy;  Laterality: N/A;  . Portacath placement  03/14/2012    Procedure: INSERTION PORT-A-CATH;  Surgeon: Ralene Ok, MD;  Location: [place];  Service: General;  Laterality: N/A;  . Laparoscopic sigmoid colectomy  04/11/2012    Procedure: LAPAROSCOPIC SIGMOID COLECTOMY;  Surgeon: Stark Klein, MD;  Location: [place];  Service: General;;  converted to Open with splenic flexure take down  . Open partial hepatectomy [83]  04/11/2012    Procedure: OPEN PARTIAL HEPATECTOMY [83];  Surgeon: Stark Klein, MD;  Location:  [place];  Service: General;  Laterality: Right;  . Laparoscopy  04/11/2012    Procedure: LAPAROSCOPY DIAGNOSTIC;  Surgeon: Stark Klein, MD;  Location: [place];  Service: General;  Laterality: N/A;  . Cholecystectomy  04/11/2012    Procedure: CHOLECYSTECTOMY;  Surgeon: Stark Klein, MD;  Location: [place];  Service: General;;  . Ileocecetomy  04/11/2012    Procedure: Pennie Rushing;  Surgeon: Stark Klein, MD;  Location: [place];  Service: General;  Laterality: N/A;  . Port a cath revision  05/23/2012    Procedure: PORT A CATH REVISION;  Surgeon: Ralene Ok, MD;  Location: [place];  Service: General;  Laterality: Right;  Port-A-Cath repositioning versus replacement  . Abdominal hysterectomy      early 44's  . Femur im nail Right 06/15/2015    Procedure: INTRAMEDULLARY (IM) NAIL INTERTROCHANTERIC ;  Surgeon: Mcarthur Rossetti, MD;  [facility];  Service: Orthopedics;  Laterality: Right;    Family History  Problem Relation Age of Onset  . Hypertension Mother   . Cancer Mother 76    breast ca  . Heart disease Father     Social History:  reports that she has never smoked. She has never used smokeless tobacco. She reports that she does not drink alcohol or use illicit drugs.  Allergies: No Known Allergies  Medications: I have reviewed the patient's current medications.  Results for orders placed or performed in visit on 08/19/15 (from the past 48 hour(s))  CBC with Differential     Status: Abnormal   Collection Time: 08/19/15  9:30 AM  Result Value Ref Range   WBC 12.3 (H) 3.9 - 10.3 10e3/uL   NEUT# 9.7 (H) 1.5 - 6.5 10e3/uL   HGB 10.7 (L) 11.6 - 15.9 g/dL   HCT 33.6 (L) 34.8 - 46.6 %   Platelets 339 145 - 400 10e3/uL   MCV 87.7 79.5 - 101.0 fL   MCH 28.0 25.1 - 34.0 pg   MCHC 31.9 31.5 - 36.0 g/dL   RBC 3.83 3.70 - 5.45 10e6/uL   RDW 16.7 (H) 11.2 - 14.5 %   lymph# 1.3 0.9 - 3.3 10e3/uL   MONO# 1.0 (H) 0.1 - 0.9 10e3/uL   Eosinophils Absolute 0.2 0.0 - 0.5 10e3/uL    Basophils Absolute 0.1 0.0 - 0.1 10e3/uL   NEUT% 78.6 (H) 38.4 - 76.8 %   LYMPH% 10.9 (L) 14.0 - 49.7 %   MONO% 7.8 0.0 - 14.0 %   EOS% 1.9 0.0 - 7.0 %   BASO% 0.8 0.0 - 2.0 %  Comprehensive metabolic panel     Status: Abnormal   Collection Time: 08/19/15  9:30 AM  Result Value Ref Range   Sodium 139 136 - 145 mEq/L   Potassium 3.8 3.5 - 5.1 mEq/L   Chloride 103 98 - 109 mEq/L   CO2 23 22 - 29 mEq/L   Glucose 92 70 - 140 mg/dl    Comment: Glucose reference range is for nonfasting patients. Fasting glucose reference range is 70- 100.   BUN 19.4 7.0 - 26.0 mg/dL   Creatinine 0.9 0.6 - 1.1 mg/dL   Total Bilirubin 0.56 0.20 - 1.20 mg/dL   Alkaline Phosphatase 98 40 - 150 U/L   AST 55 (H) 5 - 34 U/L   ALT 36 0 - 55 U/L   Total Protein 8.3 6.4 - 8.3 g/dL   Albumin 3.7 3.5 - 5.0 g/dL   Calcium 10.5 (H) 8.4 - 10.4 mg/dL   Anion Gap 12 (H) 3 - 11 mEq/L   EGFR 82 (L) >90 ml/min/1.73 m2    Comment: eGFR is calculated using the CKD-EPI Creatinine Equation (2009)    Ct Chest W Contrast  08/19/2015  CLINICAL DATA:  Metastatic colon cancer. EXAM: CT CHEST WITH CONTRAST TECHNIQUE: Multidetector CT imaging of the chest was performed during intravenous contrast administration. CONTRAST:  5m ISOVUE-300 IOPAMIDOL (ISOVUE-300) INJECTION 61% COMPARISON:  06/10/2015 FINDINGS: Mediastinum: The heart size appears normal. Aortic atherosclerosis noted. The trachea appears patent and is midline. Unremarkable appearance of the esophagus. No mediastinal or hilar adenopathy. Lungs/Pleura: No pleural fluid. Multiple pulmonary masses are again identified. Index lesion within the left upper lobe measures 5.1 x 3.7 cm, image 46 of series 5. Previously 4.1 x 3.1 cm. Index lesion within the superior segment of left lower lobe measures 2.6 x 1.9 cm, image 56 of series 5. Previously 1.7 x 1.4 cm. There is an index lesion within the right middle lobe which measures 4.4 by 3.9 cm, image 73 of series 5. Previously 3.4 x 3.2  cm. Multiple new small nodules are identified in both lungs. For example, there are two new nodules are identified in the right lower lobe, image 87 of series 5. Upper Abdomen: No suspicious liver abnormality identified. Normal appearance of the spleen. The left adrenal gland is normal. New nodule in the right adrenal gland measures 1.8 by 1.5 cm, image number 88 of series 2. Musculoskeletal: Interval fracture of the T6  vertebra containing large lytic lesion, image 39 of series 2. IMPRESSION: 1. Interval progression of pulmonary metastasis. 2. New right adrenal gland nodule worrisome for metastatic disease 3. Interval pathologic fracture of T6 vertebra which contains a large lytic lesion. These results will be called to the ordering clinician or representative by the Radiologist Assistant, and communication documented in the PACS or zVision Dashboard. Electronically Signed   By: Kerby Moors M.D.   On: 08/19/2015 14:43    Pertinent items noted in HPI and remainder of comprehensive ROS otherwise negative. Blood pressure 141/84, pulse 86, temperature 98.2 F (36.8 C), temperature source Oral, resp. rate 20, height '5\' 11"'$  (1.803 m), weight 111.086 kg (244 lb 14.4 oz), SpO2 97 %. Patient is awake and alert. She is oriented and appropriate. Her speech is fluent. Her judgment and insight are intact. Her cranial nerve function are intact. Motor and sensory function of her extremities are intact aside from some stocking sensory loss in both feet. Reflexes are hypoactive but symmetric. No evidence of long track signs.  Assessment/Plan: Metastatic lesion to T6 with minimal compression fracture. No obvious evidence of epidural spread or symptoms of thoracic myelopathy. Awaiting planning MRI scan for better determination of tumor burden. Tentatively agree for [facility] treatment on Thursday.   Maresha Anastos A 08/20/2015, 1:59 PM

## 2015-08-20 NOTE — Progress Notes (Signed)
Requesting STAT Thoracic Spine MRI with and without Contrast.   11m post contrast axial cuts from the top of T5 through the bottom of T7. No angle.   [facility]    Spoke with her nurse, Reginia,  about putting this order in.   Dr.Moody   [place] [phone]

## 2015-08-20 NOTE — Progress Notes (Signed)
IP PROGRESS NOTE  Subjective:   Ms. Sorter reports significant improvement in the back pain. She had an episode of rectal bleeding last night. No other bleeding.  Objective: Vital signs in last 24 hours: Blood pressure 141/74, pulse 78, temperature 98.4 F (36.9 C), temperature source Oral, resp. rate 18, height '5\' 11"'$  (1.803 m), weight 244 lb 14.4 oz (111.086 kg), SpO2 99 %.  Intake/Output from previous day: 04/24 0701 - 04/25 0700 In: 401.3 [P.O.:120; I.V.:281.3] Out: -   Physical Exam:  HEENT: No thrush Lungs: Clear bilaterally Cardiac: Regular rate and rhythm Abdomen: Soft and nontender Extremities: No leg edema   Portacath/PICC-without erythema  Lab Results:  Recent Labs  08/19/15 0930  WBC 12.3*  HGB 10.7*  HCT 33.6*  PLT 339    BMET  Recent Labs  08/19/15 0930  NA 139  K 3.8  CO2 23  GLUCOSE 92  BUN 19.4  CREATININE 0.9  CALCIUM 10.5*    Studies/Results: Ct Chest W Contrast  08/19/2015  CLINICAL DATA:  Metastatic colon cancer. EXAM: CT CHEST WITH CONTRAST TECHNIQUE: Multidetector CT imaging of the chest was performed during intravenous contrast administration. CONTRAST:  52m ISOVUE-300 IOPAMIDOL (ISOVUE-300) INJECTION 61% COMPARISON:  06/10/2015 FINDINGS: Mediastinum: The heart size appears normal. Aortic atherosclerosis noted. The trachea appears patent and is midline. Unremarkable appearance of the esophagus. No mediastinal or hilar adenopathy. Lungs/Pleura: No pleural fluid. Multiple pulmonary masses are again identified. Index lesion within the left upper lobe measures 5.1 x 3.7 cm, image 46 of series 5. Previously 4.1 x 3.1 cm. Index lesion within the superior segment of left lower lobe measures 2.6 x 1.9 cm, image 56 of series 5. Previously 1.7 x 1.4 cm. There is an index lesion within the right middle lobe which measures 4.4 by 3.9 cm, image 73 of series 5. Previously 3.4 x 3.2 cm. Multiple new small nodules are identified in both lungs. For example,  there are two new nodules are identified in the right lower lobe, image 87 of series 5. Upper Abdomen: No suspicious liver abnormality identified. Normal appearance of the spleen. The left adrenal gland is normal. New nodule in the right adrenal gland measures 1.8 by 1.5 cm, image number 88 of series 2. Musculoskeletal: Interval fracture of the T6 vertebra containing large lytic lesion, image 39 of series 2. IMPRESSION: 1. Interval progression of pulmonary metastasis. 2. New right adrenal gland nodule worrisome for metastatic disease 3. Interval pathologic fracture of T6 vertebra which contains a large lytic lesion. These results will be called to the ordering clinician or representative by the Radiologist Assistant, and communication documented in the PACS or zVision Dashboard. Electronically Signed   By: TKerby MoorsM.D.   On: 08/19/2015 14:43    Medications: I have reviewed the patient's current medications.  Assessment/Plan: 1. Metastatic colon cancer diagnosed in December 2013 , currently being treated with salvage regorafenib, first cycle initiated 08/02/2015  2. Right femur metastasis, status post intramedullary nail 06/15/2015  Palliative radiation to the right femur and right pelvis 07/14/2015 through 07/19/2015  3. Hypertension  4. 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     Rectal bleeding-likely secondary to hemorrhoids while on Xarelto., Xarelto will be placed on hold  Ms. BSherbertappears much improved today. This is likely secondary to Decadron. She  will continue Decadron. She will be treated with radiation to the thoracic spine.  Ms. Defreitas has a poor home social situation. She would like to speak with a "therapist "from the [place]. We  will arrange for this today.      LOS: 1 day   Betsy Coder, MD   08/20/2015, 8:35 AM

## 2015-08-20 NOTE — Progress Notes (Signed)
See inpt consult

## 2015-08-21 ENCOUNTER — Inpatient Hospital Stay (HOSPITAL_COMMUNITY): Payer: Medicare Other

## 2015-08-21 ENCOUNTER — Encounter: Payer: Self-pay | Admitting: *Deleted

## 2015-08-21 DIAGNOSIS — D630 Anemia in neoplastic disease: Secondary | ICD-10-CM

## 2015-08-21 DIAGNOSIS — D500 Iron deficiency anemia secondary to blood loss (chronic): Secondary | ICD-10-CM

## 2015-08-21 LAB — BASIC METABOLIC PANEL
ANION GAP: 6 (ref 5–15)
BUN: 25 mg/dL — ABNORMAL HIGH (ref 6–20)
CALCIUM: 9.6 mg/dL (ref 8.9–10.3)
CO2: 27 mmol/L (ref 22–32)
CREATININE: 0.87 mg/dL (ref 0.44–1.00)
Chloride: 107 mmol/L (ref 101–111)
GLUCOSE: 163 mg/dL — AB (ref 65–99)
Potassium: 4.6 mmol/L (ref 3.5–5.1)
Sodium: 140 mmol/L (ref 135–145)

## 2015-08-21 LAB — CBC
HEMATOCRIT: 27.9 % — AB (ref 36.0–46.0)
Hemoglobin: 8.9 g/dL — ABNORMAL LOW (ref 12.0–15.0)
MCH: 28.8 pg (ref 26.0–34.0)
MCHC: 31.9 g/dL (ref 30.0–36.0)
MCV: 90.3 fL (ref 78.0–100.0)
PLATELETS: 310 10*3/uL (ref 150–400)
RBC: 3.09 MIL/uL — ABNORMAL LOW (ref 3.87–5.11)
RDW: 16 % — AB (ref 11.5–15.5)
WBC: 17.1 10*3/uL — ABNORMAL HIGH (ref 4.0–10.5)

## 2015-08-21 MED ORDER — LORAZEPAM 2 MG/ML IJ SOLN
1.0000 mg | Freq: Once | INTRAMUSCULAR | Status: AC
  Administered 2015-08-21: 1 mg via INTRAVENOUS
  Filled 2015-08-21: qty 1

## 2015-08-21 MED ORDER — GADOBENATE DIMEGLUMINE 529 MG/ML IV SOLN
20.0000 mL | Freq: Once | INTRAVENOUS | Status: AC | PRN
  Administered 2015-08-21: 20 mL via INTRAVENOUS

## 2015-08-21 MED ORDER — DEXAMETHASONE 4 MG PO TABS
8.0000 mg | ORAL_TABLET | Freq: Two times a day (BID) | ORAL | Status: DC
  Administered 2015-08-21 – 2015-08-23 (×5): 8 mg via ORAL
  Filled 2015-08-21 (×5): qty 2

## 2015-08-21 NOTE — Progress Notes (Signed)
[place] Work  Clinical Social Work was referred by patient navigator for assessment of psychosocial needs, support visit and supportive counseling for pt.  Clinical Social Worker met with patient in the hospital to offer support and assess for needs.  Pt had requested visit by therapist. CSW provided supportive counseling. Pt has been doing some review of her life and it is bringing up some painful past history of complex family relationships. Pt eager to work through this as she continues to deal with her advanced cancer. CSW discussed in detail options for counseling and additional support through the [facility]. Pt open to attending Living With Cancer support group for people with advanced cancer. She reports she has not ever attended any programs at the [facility] other than counseling with [place] team. She is open to also meeting with counseling intern at [facility]. She also would like continued support through [place] team.   Pt reports her daughter continues to live with her and is helpful, but is gone during the day. Pt reports her son lives nearby and is also helpful, but they are both busy with their "own stuff". Pt requesting Meals on Wheels referral and/or additional support at home. CSW discussed option of PCS for CNA supports once pt returns home. Pt agrees to referral. Pt could also benefit from [facility] services and reports to need a shower chair. She does not currently have one or grab bars at home. Pt states she can still maneuver the stairs in her home, but this will become more difficult. A PT eval may be helpful. CSW feels a referral to Palliative Care Services and/or Hospice would be very helpful and provide additional support to pt and family. CSW is unsure if pt has completed ADRs or HCPOA paperwork to date and will bring this up with pt tomorrow morning. CSW shared concerns/referral requests with MD and RN navigator. CSW to reassess needs in the am.    Clinical Social Work  interventions:  Supportive counseling Resource education and referral  [name], [place]  [place] Phone: [phone] Fax: [phone]

## 2015-08-21 NOTE — Progress Notes (Signed)
IP PROGRESS NOTE  Subjective:   [name] continues to have back pain, but this has improved. She was unable to complete the MRI yesterday secondary to pain in the right leg.  Objective: Vital signs in last 24 hours: Blood pressure 141/85, pulse 72, temperature 97.9 F (36.6 C), temperature source Oral, resp. rate 18, height '5\' 11"'$  (1.803 m), weight 250 lb 3.6 oz (113.5 kg), SpO2 100 %.  Intake/Output from previous day: 04/25 0701 - 04/26 0700 In: 916.3 [P.O.:720; I.V.:196.3] Out: -   Physical Exam:  HEENT: No thrush Lungs: Clear anteriorly Cardiac: Regular rate and rhythm Abdomen: Soft and nontender Extremities: No leg edema   Portacath/PICC-without erythema  Lab Results:  Recent Labs  08/19/15 0930 08/21/15 0500  WBC 12.3* 17.1*  HGB 10.7* 8.9*  HCT 33.6* 27.9*  PLT 339 310    BMET  Recent Labs  08/19/15 0930 08/21/15 0500  NA 139 140  K 3.8 4.6  CL  --  107  CO2 23 27  GLUCOSE 92 163*  BUN 19.4 25*  CREATININE 0.9 0.87  CALCIUM 10.5* 9.6    Studies/Results: Ct Chest W Contrast  08/19/2015  CLINICAL DATA:  Metastatic colon cancer. EXAM: CT CHEST WITH CONTRAST TECHNIQUE: Multidetector CT imaging of the chest was performed during intravenous contrast administration. CONTRAST:  13m ISOVUE-300 IOPAMIDOL (ISOVUE-300) INJECTION 61% COMPARISON:  06/10/2015 FINDINGS: Mediastinum: The heart size appears normal. Aortic atherosclerosis noted. The trachea appears patent and is midline. Unremarkable appearance of the esophagus. No mediastinal or hilar adenopathy. Lungs/Pleura: No pleural fluid. Multiple pulmonary masses are again identified. Index lesion within the left upper lobe measures 5.1 x 3.7 cm, image 46 of series 5. Previously 4.1 x 3.1 cm. Index lesion within the superior segment of left lower lobe measures 2.6 x 1.9 cm, image 56 of series 5. Previously 1.7 x 1.4 cm. There is an index lesion within the right middle lobe which measures 4.4 by 3.9 cm, image 73 of  series 5. Previously 3.4 x 3.2 cm. Multiple new small nodules are identified in both lungs. For example, there are two new nodules are identified in the right lower lobe, image 87 of series 5. Upper Abdomen: No suspicious liver abnormality identified. Normal appearance of the spleen. The left adrenal gland is normal. New nodule in the right adrenal gland measures 1.8 by 1.5 cm, image number 88 of series 2. Musculoskeletal: Interval fracture of the T6 vertebra containing large lytic lesion, image 39 of series 2. IMPRESSION: 1. Interval progression of pulmonary metastasis. 2. New right adrenal gland nodule worrisome for metastatic disease 3. Interval pathologic fracture of T6 vertebra which contains a large lytic lesion. These results will be called to the ordering clinician or representative by the Radiologist Assistant, and communication documented in the PACS or zVision Dashboard. Electronically Signed   By: TKerby MoorsM.D.   On: 08/19/2015 14:43   Mr Thoracic Spine W Wo Contrast  08/21/2015  CLINICAL DATA:  Metastatic carcinoma of colon.  Back pain EXAM: MRI THORACIC SPINE WITHOUT AND WITH CONTRAST TECHNIQUE: Multiplanar and multiecho pulse sequences of the thoracic spine were obtained without and with intravenous contrast. CONTRAST:  284mMULTIHANCE GADOBENATE DIMEGLUMINE 529 MG/ML IV SOLN COMPARISON:  CT chest 08/19/2015 FINDINGS: SRS protocol with thin sections obtained through the fracture at T6 Pathologic fracture T6 due to tumor. Mild fracture. Enhancing soft tissue in the ventral epidural space consistent with epidural tumor, left greater than right. This extends into the left foramen at T6-7. Mild  to moderate spinal stenosis. No cord compression. Cord signal abnormality. No other fracture or metastatic deposit identified in the thoracic spine. Mild thoracic disc degeneration.  No disc protrusion identified. IMPRESSION: Pathologic fracture T6 due to tumor. 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 No cord compression. Electronically Signed   By: Franchot Gallo M.D.   On: 08/21/2015 10:38    Medications: I have reviewed the patient's current medications.  Assessment/Plan: 1. Metastatic colon cancer diagnosed in December 2013 , currently being treated with salvage regorafenib, first cycle initiated 08/02/2015  2. Right femur metastasis, status post intramedullary nail 06/15/2015  Palliative radiation to the right femur and right pelvis 07/14/2015 through 07/19/2015  3. Hypertension  4. 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     Rectal bleeding-likely secondary to hemorrhoids while on Xarelto., Xarelto will be placed on hold  9.     Anemia secondary to metastatic colon cancer and rectal bleeding-Xarelto discontinued  [name] has improved with Decadron. She has been seen by radiation oncology and neurosurgery. The plan is for [facility] treatment tomorrow.   I will change the Decadron to by mouth. Hopefully she can go home within the next few days. I will discuss Hospice care with [name].      LOS: 2 days   Betsy Coder, MD   08/21/2015, 11:40 AM

## 2015-08-22 ENCOUNTER — Ambulatory Visit: Payer: Self-pay | Admitting: Radiation Oncology

## 2015-08-22 DIAGNOSIS — Z86718 Personal history of other venous thrombosis and embolism: Secondary | ICD-10-CM

## 2015-08-22 DIAGNOSIS — I10 Essential (primary) hypertension: Secondary | ICD-10-CM

## 2015-08-22 NOTE — Care Management Important Message (Signed)
Important Message  Patient Details  Name: [name] MRN: [id number] Date of Birth: Jun 30, 1955   Medicare Important Message Given:  Yes    Camillo Flaming 08/22/2015, 10:23 AMImportant Message  Patient Details  Name: [name] MRN: [id number] Date of Birth: 1955/10/06   Medicare Important Message Given:  Yes    Camillo Flaming 08/22/2015, 10:23 AM

## 2015-08-22 NOTE — Progress Notes (Signed)
   08/22/15 1600  Clinical Encounter Type  Visited With Patient  Visit Type Initial;Spiritual support;Psychological support  Referral From Social work  Consult/Referral To Nurse;Chaplain;Social work  Sports administrator  Spiritual Needs Emotional;Prayer  Stress Factors  Patient Stress Factors Health changes;Lack of caregivers;Major life changes   Provided support with [name] at bedside in response to referral from SW.   [name] expressed exhaustion at setbacks in cancer treatment, recalling her journey with cancer since 2013.   Engaged in life review, spiritual support and prayers.   [name] leans on her faith as support and has been reaching out to churches in the area.  Her daughter currently lives with her, but [name] has not been able to share much of her journey with cancer with her daughter, as she feels her daughter is not able to be supportive of her.  She is interested in following up with cancer center support center for groups.  She recalled being raised as an only child and her desire to connect with others.  She described feeling that same desire now as she feels "cooped up inside four walls" due to health changes.   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  She values her humor and energy - which she describes inheriting from her father.

## 2015-08-22 NOTE — Progress Notes (Signed)
IP PROGRESS NOTE  Subjective:   She reports the rectal bleeding has slowed. She had maroon stool yesterday. She requests IV Dilaudid and Ativan with the [facility] treatment.  Objective: Vital signs in last 24 hours: Blood pressure 146/74, pulse 99, temperature 98.1 F (36.7 C), temperature source Oral, resp. rate 20, height '5\' 11"'$  (1.803 m), weight 250 lb (113.399 kg), SpO2 99 %.  Intake/Output from previous day: 04/26 0701 - 04/27 0700 In: 240 [P.O.:240] Out: 600 [Urine:600]  Physical Exam:  HEENT: No thrush Lungs: Clear anteriorly Cardiac: Regular rate and rhythm Abdomen: Soft and nontender Extremities: No leg edema   Portacath/PICC-without erythema  Lab Results:  Recent Labs  08/21/15 0500  WBC 17.1*  HGB 8.9*  HCT 27.9*  PLT 310    BMET  Recent Labs  08/21/15 0500  NA 140  K 4.6  CL 107  CO2 27  GLUCOSE 163*  BUN 25*  CREATININE 0.87  CALCIUM 9.6    Studies/Results: Mr Thoracic Spine W Wo Contrast  08/21/2015  CLINICAL DATA:  Metastatic carcinoma of colon.  Back pain EXAM: MRI THORACIC SPINE WITHOUT AND WITH CONTRAST TECHNIQUE: Multiplanar and multiecho pulse sequences of the thoracic spine were obtained without and with intravenous contrast. CONTRAST:  51m MULTIHANCE GADOBENATE DIMEGLUMINE 529 MG/ML IV SOLN COMPARISON:  CT chest 08/19/2015 FINDINGS: SRS protocol with thin sections obtained through the fracture at T6 Pathologic fracture T6 due to tumor. Mild fracture. Enhancing soft tissue in the ventral epidural space consistent with epidural tumor, left greater than right. This extends into the left foramen at T6-7. Mild to moderate spinal stenosis. No cord compression. Cord signal abnormality. No other fracture or metastatic deposit identified in the thoracic spine. Mild thoracic disc degeneration.  No disc protrusion identified. IMPRESSION: Pathologic fracture T6 due to tumor. 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 No cord  compression. Electronically Signed   By: CFranchot GalloM.D.   On: 08/21/2015 10:38    Medications: I have reviewed the patient's current medications.  Assessment/Plan: 1. Metastatic colon cancer diagnosed in December 2013 , currently being treated with salvage regorafenib, first cycle initiated 08/02/2015  2. Right femur metastasis, status post intramedullary nail 06/15/2015  Palliative radiation to the right femur and right pelvis 07/14/2015 through 07/19/2015  3. Hypertension  4. 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     Anemia secondary to metastatic colon cancer and rectal bleeding-Xarelto discontinued  The pain continues to be improved with Decadron. She will be treated with SRS on 08/23/2015. [facility] We will check a hemoglobin 08/23/2015.       LOS: 3 days   SBetsy Coder MD   08/22/2015, 5:32 PM

## 2015-08-23 ENCOUNTER — Ambulatory Visit: Payer: Medicare Other | Attending: Radiation Oncology | Admitting: Radiation Oncology

## 2015-08-23 ENCOUNTER — Other Ambulatory Visit: Payer: Self-pay | Admitting: Nurse Practitioner

## 2015-08-23 ENCOUNTER — Telehealth: Payer: Self-pay | Admitting: Nurse Practitioner

## 2015-08-23 ENCOUNTER — Other Ambulatory Visit: Payer: Self-pay | Admitting: *Deleted

## 2015-08-23 ENCOUNTER — Other Ambulatory Visit: Payer: Self-pay | Admitting: Oncology

## 2015-08-23 ENCOUNTER — Encounter: Payer: Self-pay | Admitting: *Deleted

## 2015-08-23 ENCOUNTER — Ambulatory Visit: Payer: Medicare Other | Admitting: Radiation Oncology

## 2015-08-23 DIAGNOSIS — C787 Secondary malignant neoplasm of liver and intrahepatic bile duct: Principal | ICD-10-CM

## 2015-08-23 DIAGNOSIS — C189 Malignant neoplasm of colon, unspecified: Secondary | ICD-10-CM

## 2015-08-23 LAB — CBC
HEMATOCRIT: 28.9 % — AB (ref 36.0–46.0)
HEMOGLOBIN: 9.3 g/dL — AB (ref 12.0–15.0)
MCH: 29.2 pg (ref 26.0–34.0)
MCHC: 32.2 g/dL (ref 30.0–36.0)
MCV: 90.9 fL (ref 78.0–100.0)
Platelets: 304 10*3/uL (ref 150–400)
RBC: 3.18 MIL/uL — ABNORMAL LOW (ref 3.87–5.11)
RDW: 16.3 % — ABNORMAL HIGH (ref 11.5–15.5)
WBC: 17.1 10*3/uL — ABNORMAL HIGH (ref 4.0–10.5)

## 2015-08-23 MED ORDER — DEXAMETHASONE 4 MG PO TABS: 4.0000 mg | ORAL_TABLET | Freq: Two times a day (BID) | ORAL | Status: DC

## 2015-08-23 MED ORDER — HEPARIN SOD (PORK) LOCK FLUSH 100 UNIT/ML IV SOLN
500.0000 [IU] | INTRAVENOUS | Status: DC | PRN
  Filled 2015-08-23: qty 5

## 2015-08-23 MED ORDER — HEPARIN SOD (PORK) LOCK FLUSH 100 UNIT/ML IV SOLN: 500.0000 [IU] | INTRAVENOUS | Status: DC

## 2015-08-23 MED ORDER — LORAZEPAM 2 MG/ML IJ SOLN
0.5000 mg | Freq: Once | INTRAMUSCULAR | Status: DC
  Filled 2015-08-23: qty 1

## 2015-08-23 MED ORDER — HYDROMORPHONE HCL 4 MG PO TABS: 4.0000 mg | ORAL_TABLET | ORAL | Status: DC | PRN

## 2015-08-23 NOTE — Discharge Summary (Addendum)
Physician Discharge Summary  Patient ID: [name] '@[email or web address]'$ @ MRN: [id number] DOB/AGE: [birth year] 60 y.o.  Admit date: 08/19/2015 Discharge date: 08/23/2015  Discharge Diagnoses:  1. Metastatic colon cancer 2. Left upper back pain secondary to a pathologic T6 compression fracture 3. History of a right upper extremity DVT 4. Anemia secondary to chronic disease, chemotherapy, and rectal bleeding  Discharged Condition: Improved  Discharge Labs:  hemoglobin 9.3 on 08/23/2015  Significant Diagnostic Studies: CT of the chest, MRI of the thoracic spine  Consults: Dr. Marga Melnick oncology, Dr. Noe Gens  Procedures:  None  Disposition: 01-Home or Self Care     Medication List    STOP taking these medications        methocarbamol 500 MG tablet  Commonly known as:  ROBAXIN     methylPREDNISolone 4 MG Tbpk tablet  Commonly known as:  MEDROL DOSEPAK     regorafenib 40 MG tablet  Commonly known as:  STIVARGA     XARELTO 20 MG Tabs tablet  Generic drug:  rivaroxaban      TAKE these medications        amLODipine 10 MG tablet  Commonly known as:  NORVASC  TAKE 1 TABLET BY MOUTH EVERY DAY     dexamethasone 4 MG tablet  Commonly known as:  DECADRON  Take 1 tablet (4 mg total) by mouth 2 (two) times daily.     diclofenac sodium 1 % Gel  Commonly known as:  VOLTAREN  Apply 2 g topically 4 (four) times daily.     hydrochlorothiazide 12.5 MG capsule  Commonly known as:  MICROZIDE  Take 1 capsule (12.5 mg total) by mouth daily.     HYDROmorphone 4 MG tablet  Commonly known as:  DILAUDID  Take 1 tablet (4 mg total) by mouth every 4 (four) hours as needed for moderate pain or severe pain.     IRON PO  Take 1 tablet by mouth daily.     lidocaine-prilocaine cream  Commonly known as:  EMLA  Apply topically as needed. Apply to [facility] site 1-2 hours prior to stick and cover with plastic wrap to numb site     loperamide 2 MG capsule  Commonly known as:   IMODIUM  TAKE ONE CAPSULE BY MOUTH AS NEEDED FOR DIARRHEA OR LOOSE STOOLS. MAX OF 8 CAPSULES PER DAY     LORazepam 0.5 MG tablet  Commonly known as:  ATIVAN  Place 1 tablet (0.5 mg total) under the tongue every 6 (six) hours as needed (for nausea). Causes drowsiness     multivitamin tablet  Take 1 tablet by mouth daily.     ondansetron 8 MG tablet  Commonly known as:  ZOFRAN  TAKE 1 TABLET BY MOUTH EVERY 8 HOURS AS NEEDED FOR NAUSEA AND VOMITING     polyethylene glycol powder powder  Commonly known as:  MIRALAX  Take 17 g by mouth daily as needed.     potassium chloride SA 20 MEQ tablet  Commonly known as:  K-DUR,KLOR-CON  TAKE 1 TABLET BY MOUTH DAILY     prochlorperazine 10 MG tablet  Commonly known as:  COMPAZINE  TAKE 1 TABLET BY MOUTH EVERY 6 HOURS AS NEEDED FOR NAUSEA OR VOMITING            Follow-up Information    Follow up with Betsy Coder, MD On 08/28/2015.   Specialty:  Oncology   Contact information:   [place] [address] [phone]  Hospital Course: [name] has a history of metastatic colon cancer, treated with salvage regorafenib therapy prior to this hospital admission.  She presented to the office on 08/22/2015 with severe pain at the mid upper back centered to the left of midline with pain radiating to the left posterior lateral chest. She was admitted for further evaluation.  A CT of the chest 08/19/2015 revealed progression of pulmonary metastases, a new right adrenal nodule concerning for a metastasis, and a pathologic fracture of T6 with a lytic component.  She was placed on Decadron and narcotic analgesics. The pain improved with Decadron. An MRI of the spine 08/21/2015 revealed a pathologic T6 fracture with enhancing soft tissue in the ventral epidural space extending into the left foramen at T6-7. No cord compression. No other fracture or metastatic deposit seen in the thoracic spine.  Dr.s Lisbeth Renshaw and Pool were  consulted. She was scheduled for [facility] treatment on 08/15/2015, but this was delayed secondary to a machine malfunction. She will be treated on 08/26/2015. [name] experiencing significant improvement in her pain during this admission. She will continue Decadron at discharge. She will use Dilaudid as needed for pain.  [name] reported recurrent rectal bleeding during this hospital admission. The hemoglobin returned low on 08/21/2015. The Xarelto was discontinued and the rectal bleeding improved. The hemoglobin was stable on the day of discharge.  [name] appears stable for discharge to home on 08/23/2015. She is scheduled for stereotactic radiosurgery on 08/26/2015. She will return for an office visit 08/28/2015 to discuss systemic treatment options.        Signed: Betsy Coder, MD 08/23/2015, 3:06 PM

## 2015-08-23 NOTE — Care Management Note (Signed)
Case Management Note  Patient Details  Name: Madeline Pho MRN: [id number] Date of Birth: [birth year]  Subjective/Objective:      Metastatic colon cancer              Action/Plan: Discharge Planning: AVS reviewed:  NCM spoke to pt at bedside. States her dtr, Chalettha assist her at home. Has Medicaid for her medications. Her copay runs $3.00 for meds. Has RW at home. Pt requested assistance with finding a PCP. Pt states she does not have a PCP, PCP appt arranged with Dr. Nancy Fetter on 6/9 at 11:30 am. Information placed on dc instructions. Pt agreeable to follow up with new PCP, Dr Nancy Fetter.   Expected Discharge Date:   08/23/2015           Expected Discharge Plan:  Home/Self Care  In-House Referral:  NA  Discharge planning Services  CM Consult  Post Acute Care Choice:  NA Choice offered to:  NA  DME Arranged:  N/A DME Agency:  NA  HH Arranged:  NA HH Agency:  NA  Status of Service:  Completed, signed off  Medicare Important Message Given:  Yes Date Medicare IM Given:    Medicare IM give by:    Date Additional Medicare IM Given:    Additional Medicare Important Message give by:     If discussed at [place] of Stay Meetings, dates discussed:    Additional Comments:  Erenest Rasher, RN 08/23/2015, 4:35 PM

## 2015-08-23 NOTE — Telephone Encounter (Signed)
per pof to sch pt appt-gave pt copy of avs °

## 2015-08-23 NOTE — Discharge Instructions (Signed)
Call for bleeding or increased pain

## 2015-08-23 NOTE — Progress Notes (Signed)
Per Shona Simpson, PA-C provided Beecher Mcardle, RN with a verbal order for a one time dose of Ativan 0.5 mg IV. Kim verbalized understanding.

## 2015-08-23 NOTE — Progress Notes (Signed)
Pt discharged home via taxi in stable condition. Discharge instructions and script given. Pt verbalized understanding.

## 2015-08-23 NOTE — Progress Notes (Signed)
[place] Work  Clinical Social Work met with pt at [employer] in her room to offer support and reassess needs. Pt excited to be going home later today. She reports she appreciated visit from chaplain. Pt shared she had a great discussion with her daughter, but is still open to additional counseling and attending support programs. CSW made referral to counseling intern, who will reach out to patient once she returns home. Pt open to [facility] group as well. CSW and pt revisited idea of CNA through [employer] through [facility]. Pt stated she was open to the referral, but only wants CNA once a week. CSW provided Dr Gearldine Shown RN with form to be faxed in as appropriate. RN from [employer] will contact pt, arrange time to come and assess needs/request after she returns home. Pt also requesting shower chair, as bathing has become difficult. RN provided with form. Pt can discontinue PCS at anytime. Pt agrees to CSW follow up next week.     Clinical Social Work interventions: Supportive counseling Resource education and referral   [name], [place] Worker [place]  [place] Phone: [phone] Fax: [phone]

## 2015-08-26 ENCOUNTER — Encounter: Payer: Self-pay | Admitting: Radiation Oncology

## 2015-08-26 ENCOUNTER — Ambulatory Visit
Admit: 2015-08-26 | Discharge: 2015-08-26 | Disposition: A | Discharge status: Home / Self Care | Payer: Medicare Other | Attending: Radiation Oncology | Admitting: Radiation Oncology

## 2015-08-26 ENCOUNTER — Encounter: Payer: Self-pay | Admitting: *Deleted

## 2015-08-26 VITALS — BP 124/83 | HR 83 | Temp 98.7°F | Resp 18

## 2015-08-26 DIAGNOSIS — D630 Anemia in neoplastic disease: Secondary | ICD-10-CM | POA: Diagnosis not present

## 2015-08-26 DIAGNOSIS — C7951 Secondary malignant neoplasm of bone: Secondary | ICD-10-CM

## 2015-08-26 DIAGNOSIS — Z510 Encounter for antineoplastic radiation therapy: Secondary | ICD-10-CM | POA: Diagnosis present

## 2015-08-26 DIAGNOSIS — K625 Hemorrhage of anus and rectum: Secondary | ICD-10-CM | POA: Diagnosis not present

## 2015-08-26 DIAGNOSIS — I82890 Acute embolism and thrombosis of other specified veins: Secondary | ICD-10-CM | POA: Diagnosis not present

## 2015-08-26 DIAGNOSIS — I10 Essential (primary) hypertension: Secondary | ICD-10-CM | POA: Diagnosis not present

## 2015-08-26 DIAGNOSIS — D500 Iron deficiency anemia secondary to blood loss (chronic): Secondary | ICD-10-CM | POA: Diagnosis not present

## 2015-08-26 DIAGNOSIS — Z85038 Personal history of other malignant neoplasm of large intestine: Secondary | ICD-10-CM | POA: Diagnosis not present

## 2015-08-26 DIAGNOSIS — C785 Secondary malignant neoplasm of large intestine and rectum: Secondary | ICD-10-CM | POA: Diagnosis not present

## 2015-08-26 DIAGNOSIS — C189 Malignant neoplasm of colon, unspecified: Secondary | ICD-10-CM | POA: Diagnosis present

## 2015-08-26 NOTE — Progress Notes (Signed)
[place] Work  Clinical Social Work phoned pt to check in after her discharge home. Pt reports she is really ready to get her SRS completed today. She is "tired after setback, after setback". CSW provided emotional support and active listening. Pt reports things are going better at home. She still has not been contacted about her shower chair request. CSW will check with RN later this week if pt still has not been contacted by [facility]. CSW to follow and assist accordingly.   Clinical Social Work interventions: Emotional support  Resource asst.  [name], LCSW Clinical Social Worker [place]  [place] Phone: [phone] Fax: [phone]

## 2015-08-26 NOTE — Op Note (Signed)
   Name: [name]  MRN: [id number]  Date: 08/26/2015   DOB: 08-05-1955  Stereotactic Radiosurgery Operative Note  PRE-OPERATIVE DIAGNOSIS:  Spinal Metastasis  POST-OPERATIVE DIAGNOSIS:  Spinal Metastasis  PROCEDURE:  Stereotactic Radiosurgery  SURGEON:  Charlie Pitter, MD  NARRATIVE: The patient underwent a radiation treatment planning session in the radiation oncology simulation suite under the care of the radiation oncology physician and physicist.  I participated closely in the radiation treatment planning afterwards. The patient underwent planning CT myelogram which was fused to the MRI.  These images were fused on the planning system.  Radiation oncology contoured the gross target volume and subsequently expanded this to yield the Planning Target Volume. I actively participated in the planning process.  I helped to define and review the target contours and also the contours of the spinal cord, and selected nearby organs at risk.  All the dose constraints for critical structures were reviewed and compared to AAPM Task Group 101.  The prescription dose conformity was reviewed.  I approved the plan electronically.    Accordingly, [name] was brought to the TrueBeam stereotactic radiation treatment linac and placed in the custom immobilization device.  The patient was aligned according to the IR fiducial markers with BrainLab Exactrac, then orthogonal x-rays were used in ExacTrac with the 6DOF robotic table and the shifts were made to align the patient.  Then conebeam CT was performed to verify precision.  [name] received stereotactic radiosurgery uneventfully.  The detailed description of the procedure is recorded in the radiation oncology procedure note.  I was present for the duration of the procedure.  DISPOSITION:  Following delivery, the patient was transported to nursing in stable condition and monitored for possible acute effects to be discharged to home in stable condition with  follow-up in one month.  Charlie Pitter, MD 08/26/2015 5:11 PM

## 2015-08-27 ENCOUNTER — Other Ambulatory Visit: Payer: Self-pay | Admitting: Oncology

## 2015-08-27 ENCOUNTER — Telehealth: Payer: Self-pay | Admitting: *Deleted

## 2015-08-27 NOTE — Telephone Encounter (Signed)
Called [facility] to follow up on shower chair order. Pt's insurance does not cover shower chair. Pt declined to purchase chair at this time.

## 2015-08-28 ENCOUNTER — Ambulatory Visit (HOSPITAL_BASED_OUTPATIENT_CLINIC_OR_DEPARTMENT_OTHER): Payer: Medicare Other | Admitting: Nurse Practitioner

## 2015-08-28 ENCOUNTER — Other Ambulatory Visit (HOSPITAL_BASED_OUTPATIENT_CLINIC_OR_DEPARTMENT_OTHER): Payer: Medicare Other

## 2015-08-28 ENCOUNTER — Other Ambulatory Visit: Payer: Self-pay | Admitting: Nurse Practitioner

## 2015-08-28 ENCOUNTER — Telehealth: Payer: Self-pay | Admitting: Oncology

## 2015-08-28 ENCOUNTER — Encounter: Payer: Self-pay | Admitting: *Deleted

## 2015-08-28 VITALS — BP 152/77 | HR 80 | Temp 98.3°F | Resp 18 | Ht 71.0 in | Wt 247.2 lb

## 2015-08-28 DIAGNOSIS — C787 Secondary malignant neoplasm of liver and intrahepatic bile duct: Secondary | ICD-10-CM

## 2015-08-28 DIAGNOSIS — C7801 Secondary malignant neoplasm of right lung: Secondary | ICD-10-CM

## 2015-08-28 DIAGNOSIS — C189 Malignant neoplasm of colon, unspecified: Secondary | ICD-10-CM

## 2015-08-28 DIAGNOSIS — C7951 Secondary malignant neoplasm of bone: Secondary | ICD-10-CM

## 2015-08-28 DIAGNOSIS — D6481 Anemia due to antineoplastic chemotherapy: Secondary | ICD-10-CM | POA: Diagnosis not present

## 2015-08-28 DIAGNOSIS — C7802 Secondary malignant neoplasm of left lung: Secondary | ICD-10-CM

## 2015-08-28 DIAGNOSIS — C187 Malignant neoplasm of sigmoid colon: Secondary | ICD-10-CM

## 2015-08-28 LAB — CBC WITH DIFFERENTIAL/PLATELET
BASO%: 0.1 % (ref 0.0–2.0)
BASOS ABS: 0 10*3/uL (ref 0.0–0.1)
EOS ABS: 0 10*3/uL (ref 0.0–0.5)
EOS%: 0 % (ref 0.0–7.0)
HEMATOCRIT: 34.6 % — AB (ref 34.8–46.6)
HEMOGLOBIN: 11.2 g/dL — AB (ref 11.6–15.9)
LYMPH#: 0.6 10*3/uL — AB (ref 0.9–3.3)
LYMPH%: 3.5 % — ABNORMAL LOW (ref 14.0–49.7)
MCH: 28.6 pg (ref 25.1–34.0)
MCHC: 32.4 g/dL (ref 31.5–36.0)
MCV: 88.5 fL (ref 79.5–101.0)
MONO#: 0.3 10*3/uL (ref 0.1–0.9)
MONO%: 2 % (ref 0.0–14.0)
NEUT#: 15.4 10*3/uL — ABNORMAL HIGH (ref 1.5–6.5)
NEUT%: 94.4 % — ABNORMAL HIGH (ref 38.4–76.8)
NRBC: 0 % (ref 0–0)
Platelets: 283 10*3/uL (ref 145–400)
RBC: 3.91 10*6/uL (ref 3.70–5.45)
RDW: 17 % — AB (ref 11.2–14.5)
WBC: 16.3 10*3/uL — ABNORMAL HIGH (ref 3.9–10.3)

## 2015-08-28 NOTE — Telephone Encounter (Signed)
Letter of medical necessity written by provider. Silvano Bilis, [facility] liaison for [facility] to arrange appeal for shower chair.  1635: Return call from Santiago Glad: [facility] will pay for shower chair. They will have chair shipped to patient.

## 2015-08-28 NOTE — Telephone Encounter (Signed)
Gave pt appt & avs.. Pt requested afternoon

## 2015-08-28 NOTE — Telephone Encounter (Signed)
Changed appt to 12:15 provider request pt aware

## 2015-08-28 NOTE — Progress Notes (Signed)
[place] Work  Clinical Social Work met briefly with pt prior to her MD appointment. Pt states she could not get shower chair covered by [facility] as they state it is not medically necessary. Pt concerned as she does not have the funds to purchase a shower chair currently. Pt educated that MD may need to assist with documentation that it is medically necessary in order to get covered. CSW shared concern with RN as well. CSW does not have other options to cover this need currently. CSW will continue to look at other possible resource options for this need. Pt aware CSW available as needed.   Clinical Social Work interventions: Resource assistance Pt advocacy  [name], [place] Worker [place]  [place] Phone: [phone] Fax: [phone]

## 2015-08-28 NOTE — Progress Notes (Addendum)
[place] OFFICE PROGRESS NOTE   Diagnosis:  Colon cancer  INTERVAL HISTORY:   [name] returns as scheduled. She underwent SRS to T6 on 08/26/2015. Back pain is better. She takes Dilaudid as needed, mainly at nighttime. She continues dexamethasone 4 mg twice daily. She denies nausea/vomiting. No diarrhea or constipation. No further rectal bleeding. She remains off of Xarelto.  Objective:  Vital signs in last 24 hours:  Blood pressure 152/77, pulse 80, temperature 98.3 F (36.8 C), temperature source Oral, resp. rate 18, height '5\' 11"'$  (1.803 m), weight 247 lb 3.2 oz (112.129 kg), SpO2 100 %.    HEENT: no thrush or ulcers. Resp: lungs clear bilaterally. Cardio: regular rate and rhythm. GI: abdomen soft and nontender. No hepatomegaly. Vascular: no leg edema. Port-A-Cath without erythema.   Lab Results:  Lab Results  Component Value Date   WBC 16.3* 08/28/2015   HGB 11.2* 08/28/2015   HCT 34.6* 08/28/2015   MCV 88.5 08/28/2015   PLT 283 08/28/2015   NEUTROABS 15.4* 08/28/2015    Imaging:  No results found.  Medications: I have reviewed the patient's current medications.  Assessment/Plan: 1. Metastatic colon cancer (T4, N0, M1) adenocarcinoma of the sigmoid colon status post sigmoid colectomy and partial right hepatectomy 04/11/2012. Microsatellite stable. No loss of mismatch repair proteins. K-ras wild-type on the initial codon 12 and 13 testing. K-ras Q61L and APC mutations identified on extended testing. BRAF not mutated. No HER-2 amplification on Foundation 1 testing.  She completed cycle 1 CAPOX beginning 05/25/2012.   She completed cycle 8 CAPOX beginning 10/19/2012.   CEA on 10/19/2012 normal at 0.8.   CEA 6.1 on 01/11/2013   Restaging CT on 01/11/2013 consistent with multiple new pulmonary metastases   Cycle 1 of FOLFIRI/Avastin 01/18/2013; cycle 2 02/01/2013,cycle 3 02/16/2013.   CEA 5.0 on 03/01/2013.   Cycle 4  FOLFIRI/Avastin 03/01/2013.   Cycle 5 FOLFIRI/Avastin 03/15/2013.   Restaging chest CT 03/29/2013 with possible slight decrease in size of a left upper lobe metastasis. Other pulmonary nodules stable.   Cycle 6 FOLFIRI/Avastin 03/29/2013   Cycle 7 of FOLFIRI/Avastin 04/12/2013.   CEA 6.3 05/03/2013.   Cycle 8 FOLFIRI/Avastin 05/03/2013.   Cycle 9 FOLFIRI/Avastin 05/17/2013.   Cycle 10 FOLFIRI/Avastin 05/31/2013.   CEA 10.6 on 06/19/2013.   CT chest 06/19/2013 with mild progression of bilateral pulmonary nodules/metastases.   Decision made to continue FOLFIRI/Avastin. She completed cycle 11 06/21/2013.   Cycle 12 FOLFIRI/Avastin 07/05/2013   Cycle 13 FOLFIRI/Avastin 07/19/2013   Cycle 14 FOLFIRI/Avastin 08/01/2013.   CT chest 08/28/2013 with increased in size of bilateral lung nodules.   PET scan 09/22/2013 with multiple hypermetabolic pulmonary nodules bilaterally. Nodules demonstrate progressive enlargement compared with prior CTs. No evidence of extrathoracic metastatic disease.   Status post SRS 2 lung nodules, completed 10/18/2013  CT 01/12/2014 with airspace disease surrounding treated pulmonary nodules, no new nodules  01/12/2014 CEA 0.9  CT chest 04/12/2014 with improvement in airspace disease surrounding treated pulmonary nodules, no new nodules  04/12/2014 CEA 8.2  07/03/2014 CEA 60.3  CT 07/03/2014 with masslike enlargement of 2 treated lung lesions  Cycle 1 Lonsurf 07/12/2014.  Cycle 2 Lonsurf 08/11/2014 or 08/12/2014  Cycle 3 Lonsurf 09/08/2014  Cycle 4 Lonsurf 10/06/2014  Restaging CT of the chest on 10/01/2014 with stable lung nodules  Cycle 5 Lonsurf 11/05/2014  Cycle 6 Lonsurf 12/03/2014  Restaging CTs of the chest, abdomen, and pelvis on 12/26/2014 with no evidence of disease progression, stable lung nodules  Cycle 7 Lonsurf 12/31/2014  Cycle 8 Lonsurf 01/28/2015  Elevated CEA 03/12/2015  CT 03/19/2015 with  enlargement of bilateral lung masses and progressive bone metastases including an enlarging lesion at T6 with replacement of the vertebral body  Cycle 1 FOLFOX/Avastin 03/25/2015  Cycle 2 FOLFOX/Avastin 04/08/2015  Cycle 3 FOLFOX/Avastin 04/23/2015  Cycle 4 FOLFOX/Avastin 05/08/2015  Cycle 5 FOLFOX/Avastin 05/22/2015  Restaging CT scans 06/10/2015 with decreased size of bilateral pulmonary metastases; stable hepatic steatosis and postoperative changes from partial hepatectomy; no definite liver metastases; mild increase in size of lytic bone metastasis in the right ilium; stable T6 vertebral body lytic metastasis  CT right femur 06/12/2015 with lesion in the right femur, status post placement of an intramedullary nail 06/15/2015  Cycle 1 Regorafenib beginning 08/02/2015 (prematurely discontinued)  Pathologic T6 compression fracture 08/19/2015 status post [facility] 08/26/2015  Cycle 2 Regorafenib beginning 08/29/2015 (dose reduced to 80 mg daily for 21 days followed by a 7 day break) 2. Microcytic anemia, iron deficiency. Improved.  3. Status post Port-A-Cath placement 03/14/2012. X-ray showed the Port-A-Cath tip to be in the azygos vein. The left-sided Port-A-Cath was removed and a new right Port-A-Cath was placed on 05/23/2012. 4. 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 Xarelto discontinued April 2017 due to rectal bleeding. 5. Hypertension-persistent on amlodipine. HCTZ added 12/26/2014 6. Delayed nausea following cycle 5 FOLFIRI/Avastin. Emend was added to the premedication regimen 7. Nausea secondary to Lonsurf-improved with Ativan 8. Mild oxaliplatin neuropathy 9. Right groin pain. Question referred pain from the hip. 10. CT right femur 06/12/2015 with a partially visualized 4.3 x 2.3 cm lucent lesion in the inferior aspect of the right iliac bone superior to the right acetabulum. Associated destruction of the cortex  of the bone; second lesion noted in the midportion of the right femur measuring 1.7 x 6.6 cm. Partial erosive changes of the cortex of the bone adjacent to the lesion.   Status post placement of an intramedullary nail right femur 06/15/2015.   She completed palliative radiation to the right femur and right pelvis 07/04/2015 through 07/19/2015. 11. Difficulty sleeping. She will try temazepam. 12. Anemia secondary to chronic disease, chemotherapy and rectal bleeding   Disposition: Ms. Kapral appears stable. Back pain is better. We will contact radiation oncology regarding a taper schedule for dexamethasone.  Dr. Benay Spice recommends she resume Regorafenib. She would like to reduce the dose. She will begin cycle 2 on 08/29/2015 at a dose of 80 mg daily for 21 days followed by a 7 day break.  She is no longer having rectal bleeding. Hemoglobin has improved. She will continue to hold Xarelto.  She will return for a follow-up visit and labs in 2 weeks. She will contact the office in the interim with any problems. We specifically discussed recurrent rectal bleeding.  Patient seen with Dr. Benay Spice. 25 minutes were spent face-to-face at today's visit with the majority of that time involved in counseling/coordination of care.    Ned Card ANP/GNP-BC   08/28/2015  11:00 AM This was a shared visit with Ned Card. LindaMaxwell will resume regorafenib.  It is too early to judge the response with regorafenib. Treatment was started about 2 months after the baseline CT.  Julieanne Manson, MD

## 2015-08-30 ENCOUNTER — Telehealth: Payer: Self-pay | Admitting: *Deleted

## 2015-08-30 NOTE — Telephone Encounter (Signed)
Message from Malcolm,  to follow up on CNA homecare orders. Called pt to confirm she is interested in CNA assistance at home, since she has declined it in the past. Pt does want an order for CNA services. Reviewed with Ned Card, NP: Order received and faxed to [facility].  Noted pt had called earlier today about pain. Pt reported pain had resolved but she noticed it again on 5/4 and it is severe today 8/10. She has not been taking Dilaudid Q4 hours because it makes her drowsy. When asked, she reported she stopped Decadron after visit on 5/3. Pt reports she has enough Dilaudid to last through the weekend.   Reviewed with providers. Per Dr. Benay Spice: Take Decadron 8 mg BID through the weekend. Begin 4 mg BID on 5/8 and call office with update. Pt wrote down and read back Decadron instructions. She voiced appreciation for call.

## 2015-08-30 NOTE — Telephone Encounter (Signed)
Patient had called and left vm about pain medication written by Dr. Benay Spice on 4/28.17, stated her pain was better but now coming back, asked for a call back to discuss, called patient, and left vm, that if she is taking dilaudid every 4 hours prn as rx written by Dr. Benay Spice she could be out by Monday, she will need to call Dr. Gearldine Shown office to renew that pain, med, she also has appt with Ned Card NP omn 09/11/15 9:46 AM

## 2015-09-04 ENCOUNTER — Ambulatory Visit: Payer: Self-pay | Admitting: Radiation Oncology

## 2015-09-05 ENCOUNTER — Telehealth: Payer: Self-pay | Admitting: *Deleted

## 2015-09-05 ENCOUNTER — Encounter: Payer: Self-pay | Admitting: General Practice

## 2015-09-05 DIAGNOSIS — C787 Secondary malignant neoplasm of liver and intrahepatic bile duct: Principal | ICD-10-CM

## 2015-09-05 DIAGNOSIS — C189 Malignant neoplasm of colon, unspecified: Secondary | ICD-10-CM

## 2015-09-05 DIAGNOSIS — C182 Malignant neoplasm of ascending colon: Secondary | ICD-10-CM

## 2015-09-05 MED ORDER — HYDROMORPHONE HCL 4 MG PO TABS
4.0000 mg | ORAL_TABLET | ORAL | Status: DC | PRN
Start: 2015-09-05 — End: 2015-09-16

## 2015-09-05 NOTE — Telephone Encounter (Signed)
Per Dr. Benay Spice, pt to decrease Decadron dose to once a day and continue Dilaudid as previously ordered.  Pt notified of MD instructions and verbalizes an understanding of information.  Teach back done.  Pt states that her current Dilaudid dose is controlling her pain and that she does not want an increase in her dose at this time.

## 2015-09-05 NOTE — Telephone Encounter (Signed)
"  I was to call back last week and did not.  I am still having problems with my back.  The pain is an 8 to 9 out of 10 on pain scale.  The medicine helps but I'm tired of hurting with spasms.  Persistent pain has now moved up lower back to rib area."  Bowels moving well.  Reports six pills left of dexamethasone and fourteen hydromorphone pills left.  No transportation except [facility].  She will call today to arrange for prescription pick up tomorrow morning.  Will notify providers.

## 2015-09-05 NOTE — Progress Notes (Signed)
Telephone call  Per referral, counselor contacted the client to try and schedule first counseling appointment. Client reported that she has not been feeling well, and that she would call back to schedule a counseling appointment when she is feeling better. Counselor gave client the correct contact information to schedule an appointment.   Wendall Papa, MS, [place], [facility] Counseling Intern-Department for Spiritual Care and [place], [place], [place]

## 2015-09-06 ENCOUNTER — Encounter: Payer: Self-pay | Admitting: *Deleted

## 2015-09-06 MED FILL — HYDROmorphone HCL 4 MG TABS: 4 | 10 days supply | Qty: 60 | Fill #0

## 2015-09-06 NOTE — Progress Notes (Unsigned)
[place] Work  Clinical Social Work phoned pt to check in and reassess needs. CSW left message of support. CSW had heard from counseling intern that pt was not feeling well. CSW will continue to follow closely and will check in at next appointment.     [name], [place] Worker [place]  [place] Phone: [phone] Fax: [phone]

## 2015-09-11 ENCOUNTER — Encounter: Payer: Self-pay | Admitting: *Deleted

## 2015-09-11 ENCOUNTER — Other Ambulatory Visit: Payer: Medicare Other

## 2015-09-11 ENCOUNTER — Telehealth: Payer: Self-pay | Admitting: Nurse Practitioner

## 2015-09-11 ENCOUNTER — Encounter: Payer: Self-pay | Admitting: Nurse Practitioner

## 2015-09-11 ENCOUNTER — Ambulatory Visit: Payer: Medicare Other | Admitting: Nurse Practitioner

## 2015-09-11 ENCOUNTER — Other Ambulatory Visit (HOSPITAL_BASED_OUTPATIENT_CLINIC_OR_DEPARTMENT_OTHER): Payer: Medicare Other

## 2015-09-11 ENCOUNTER — Ambulatory Visit
Admission: RE | Admit: 2015-09-11 | Discharge: 2015-09-11 | Disposition: A | Discharge status: Home / Self Care | Payer: Medicare Other | Source: Ambulatory Visit | Attending: Radiation Oncology | Admitting: Radiation Oncology

## 2015-09-11 ENCOUNTER — Ambulatory Visit (HOSPITAL_BASED_OUTPATIENT_CLINIC_OR_DEPARTMENT_OTHER): Payer: Medicare Other | Admitting: Nurse Practitioner

## 2015-09-11 VITALS — BP 124/49 | HR 104 | Temp 98.6°F | Resp 18 | Ht 71.0 in

## 2015-09-11 VITALS — BP 123/81 | HR 115 | Temp 98.8°F | Resp 18

## 2015-09-11 DIAGNOSIS — C787 Secondary malignant neoplasm of liver and intrahepatic bile duct: Principal | ICD-10-CM

## 2015-09-11 DIAGNOSIS — D509 Iron deficiency anemia, unspecified: Secondary | ICD-10-CM | POA: Diagnosis not present

## 2015-09-11 DIAGNOSIS — C187 Malignant neoplasm of sigmoid colon: Secondary | ICD-10-CM

## 2015-09-11 DIAGNOSIS — C189 Malignant neoplasm of colon, unspecified: Secondary | ICD-10-CM

## 2015-09-11 DIAGNOSIS — C7800 Secondary malignant neoplasm of unspecified lung: Secondary | ICD-10-CM

## 2015-09-11 DIAGNOSIS — C7951 Secondary malignant neoplasm of bone: Secondary | ICD-10-CM

## 2015-09-11 DIAGNOSIS — M549 Dorsalgia, unspecified: Secondary | ICD-10-CM | POA: Diagnosis not present

## 2015-09-11 LAB — COMPREHENSIVE METABOLIC PANEL
ALBUMIN: 3.1 g/dL — AB (ref 3.5–5.0)
ALK PHOS: 86 U/L (ref 40–150)
ALT: 37 U/L (ref 0–55)
AST: 27 U/L (ref 5–34)
Anion Gap: 11 mEq/L (ref 3–11)
BILIRUBIN TOTAL: 0.84 mg/dL (ref 0.20–1.20)
BUN: 17.1 mg/dL (ref 7.0–26.0)
CALCIUM: 10.6 mg/dL — AB (ref 8.4–10.4)
CHLORIDE: 103 meq/L (ref 98–109)
CO2: 23 mEq/L (ref 22–29)
CREATININE: 0.9 mg/dL (ref 0.6–1.1)
EGFR: 76 mL/min/{1.73_m2} — ABNORMAL LOW (ref >90)
Glucose: 120 mg/dl (ref 70–140)
Potassium: 4.2 mEq/L (ref 3.5–5.1)
Sodium: 137 mEq/L (ref 136–145)
TOTAL PROTEIN: 7.8 g/dL (ref 6.4–8.3)

## 2015-09-11 LAB — CBC WITH DIFFERENTIAL/PLATELET
BASO%: 0.4 % (ref 0.0–2.0)
Basophils Absolute: 0 10*3/uL (ref 0.0–0.1)
EOS ABS: 0.2 10*3/uL (ref 0.0–0.5)
EOS%: 1.8 % (ref 0.0–7.0)
HEMATOCRIT: 36.1 % (ref 34.8–46.6)
HEMOGLOBIN: 11.6 g/dL (ref 11.6–15.9)
LYMPH#: 1 10*3/uL (ref 0.9–3.3)
LYMPH%: 9.4 % — ABNORMAL LOW (ref 14.0–49.7)
MCH: 28.4 pg (ref 25.1–34.0)
MCHC: 32.2 g/dL (ref 31.5–36.0)
MCV: 88.1 fL (ref 79.5–101.0)
MONO#: 0.6 10*3/uL (ref 0.1–0.9)
MONO%: 6.1 % (ref 0.0–14.0)
NEUT%: 82.3 % — ABNORMAL HIGH (ref 38.4–76.8)
NEUTROS ABS: 8.4 10*3/uL — AB (ref 1.5–6.5)
PLATELETS: 198 10*3/uL (ref 145–400)
RBC: 4.1 10*6/uL (ref 3.70–5.45)
RDW: 18.7 % — AB (ref 11.2–14.5)
WBC: 10.2 10*3/uL (ref 3.9–10.3)

## 2015-09-11 MED ORDER — DEXAMETHASONE 4 MG PO TABS: 4.0000 mg | ORAL_TABLET | Freq: Three times a day (TID) | ORAL | Status: DC

## 2015-09-11 MED ORDER — SODIUM CHLORIDE 0.9% FLUSH
10.0000 mL | INTRAVENOUS | Status: DC | PRN
  Administered 2015-09-11: 10 mL via INTRAVENOUS
  Filled 2015-09-11: qty 10

## 2015-09-11 MED ORDER — SODIUM CHLORIDE 0.9 % IV SOLN
20.0000 mg | Freq: Once | INTRAVENOUS | Status: AC
  Administered 2015-09-11: 20 mg via INTRAVENOUS
  Filled 2015-09-11: qty 2

## 2015-09-11 MED ORDER — HEPARIN SOD (PORK) LOCK FLUSH 100 UNIT/ML IV SOLN
500.0000 [IU] | Freq: Once | INTRAVENOUS | Status: AC
  Administered 2015-09-11: 500 [IU] via INTRAVENOUS
  Filled 2015-09-11: qty 5

## 2015-09-11 NOTE — Progress Notes (Signed)
[name]  Clinical Social [name] followed up with pt at her appointments today to check in and reassess needs. Pt concerned about pain and planned to have medical team address. Pt stated she had spoken with counseling intern via phone, but had not been up to coming in due to her pain level. CSW provided a "little box of sunshine" as a pick me up and pt appeared to have a brighter affect. CSW wonders if a referral to [facility] through [place] could help address pain concerns. Pt has been eager to do some "life review" [name]. CSW did not approach pt about this today. Pt shared she and her daughter had been talking more and their lives. CSW will continue to follow closely.   Clinical Social [name] interventions: Supportive listening  [name], [name]  [place] Phone: [phone] Fax: [phone]

## 2015-09-11 NOTE — Progress Notes (Signed)
[place] Work  Clinical Social Work met with pt. Pt shared that she did receive shower chair via mail and was very pleased this was approved. She stated she uses it often and it is a huge help to her.    [name], [place] Worker [place]  [place] Phone: [phone] Fax: [phone]

## 2015-09-11 NOTE — Progress Notes (Signed)
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  Interval Since Last Radiation:  The patient completed radiation treatment to the lung on 10/18/2013   Narrative:  The patient returns today for evaluation. The patient is having some pain underneath the left breast which she describes as having a radicular quality to it. The patient states that her back pain is much improved. She also notes some unsteadiness on her feet.   The patient states that she has not been taking her Decadron recently, likely for the last couple of weeks. She is somewhat confused about this medication.                    Past Medical History:  Past Medical History  Diagnosis Date  . Hypertension     pt states she stopped taking meds while in her 20's  . Blood in stool   . History of blood transfusion   . Anemia   . Eczema   . Heart murmur     does not see a cardiologist has been told she does has a murmur and also that she does not have a murmur  . Anxiety   . Neuromuscular disorder (HCC)     numbness feet  . Hx of radiation therapy 10/06/13,16th,19th,22nd,&10/18/13    SBRT lung  . Cancer ([place])   . Metastatic colon cancer to liver [facility])     Past Surgical History: Past Surgical History  Procedure Laterality Date  . Breast lumpectomy      left breast  . Colonoscopy  02/18/2012    Procedure: COLONOSCOPY;  Surgeon: Beryle Beams, MD;  Location: [place];  Service: Endoscopy;  Laterality: N/A;  . Portacath placement  03/14/2012    Procedure: INSERTION PORT-A-CATH;  Surgeon: Ralene Ok, MD;  Location: [place];  Service: General;  Laterality: N/A;  . Laparoscopic sigmoid colectomy  04/11/2012    Procedure: LAPAROSCOPIC SIGMOID COLECTOMY;  Surgeon: Stark Klein, MD;  Location: [place];  Service: General;;  converted to Open with splenic flexure take down  . Open partial hepatectomy [83]  04/11/2012    Procedure: OPEN PARTIAL HEPATECTOMY [83];  Surgeon: Stark Klein, MD;  Location: [place];  Service: General;  Laterality: Right;  . Laparoscopy  04/11/2012    Procedure: LAPAROSCOPY DIAGNOSTIC;  Surgeon: Stark Klein, MD;  Location: [place];  Service: General;  Laterality: N/A;  . Cholecystectomy  04/11/2012    Procedure: CHOLECYSTECTOMY;  Surgeon: Stark Klein, MD;  Location: [place];  Service: General;;  . Ileocecetomy  04/11/2012    Procedure: Pennie Rushing;  Surgeon: Stark Klein, MD;  Location: [place];  Service: General;  Laterality: N/A;  . Port a cath revision  05/23/2012    Procedure: PORT A CATH REVISION;  Surgeon: Ralene Ok, MD;  Location: [place];  Service: General;  Laterality: Right;  Port-A-Cath repositioning versus replacement  . Abdominal hysterectomy      early 63's  . Femur im nail Right 06/15/2015    Procedure: INTRAMEDULLARY (IM) NAIL INTERTROCHANTERIC ;  Surgeon: Mcarthur Rossetti, MD;  Location: [facility];  Service: Orthopedics;  Laterality: Right;    Social History:  Social History   Social History  . Marital Status: Single    Spouse  Name: N/A  . Number of Children: N/A  . Years of Education: N/A   Occupational History  . Not on file.   Social History Main Topics  . Smoking status: Never Smoker   . Smokeless tobacco: Never Used  . Alcohol Use: No  . Drug Use: No  . Sexual Activity: Not Currently   Other Topics Concern  . Not on file   Social History Narrative    Family History: Family History  Problem Relation Age of Onset  . Hypertension Mother   . Cancer Mother 94    breast ca  . Heart disease Father     ALLERGIES:  has No Known Allergies.  Meds: Current Outpatient Prescriptions  Medication Sig Dispense Refill  . amLODipine (NORVASC) 10 MG tablet TAKE 1 TABLET BY MOUTH EVERY DAY 90 tablet 0  .  dexamethasone (DECADRON) 4 MG tablet Take 1 tablet (4 mg total) by mouth 3 (three) times daily. 90 tablet 0  . diclofenac sodium (VOLTAREN) 1 % GEL Apply 2 g topically 4 (four) times daily. (Patient taking differently: Apply 2 g topically 4 (four) times daily as needed (port). ) 100 g 3  . hydrochlorothiazide (MICROZIDE) 12.5 MG capsule Take 1 capsule (12.5 mg total) by mouth daily. 30 capsule 1  . HYDROmorphone (DILAUDID) 4 MG tablet Take 1 tablet (4 mg total) by mouth every 4 (four) hours as needed for moderate pain or severe pain. 60 tablet 0  . IRON PO Take 1 tablet by mouth daily.    Marland Kitchen lidocaine-prilocaine (EMLA) cream Apply topically as needed. Apply to [facility] site 1-2 hours prior to stick and cover with plastic wrap to numb site 30 g 3  . loperamide (IMODIUM) 2 MG capsule TAKE ONE CAPSULE BY MOUTH AS NEEDED FOR DIARRHEA OR LOOSE STOOLS. MAX OF 8 CAPSULES PER DAY (Patient not taking: Reported on 09/11/2015) 30 capsule 1  . LORazepam (ATIVAN) 0.5 MG tablet Place 1 tablet (0.5 mg total) under the tongue every 6 (six) hours as needed (for nausea). Causes drowsiness (Patient not taking: Reported on 08/28/2015) 30 tablet 0  . Multiple Vitamins-Minerals (MULTIVITAMIN) tablet Take 1 tablet by mouth daily.    . ondansetron (ZOFRAN) 8 MG tablet TAKE 1 TABLET BY MOUTH EVERY 8 HOURS AS NEEDED FOR NAUSEA AND VOMITING (Patient not taking: Reported on 09/11/2015) 20 tablet 2  . polyethylene glycol powder (MIRALAX) powder Take 17 g by mouth daily as needed. 255 g 0  . potassium chloride SA (K-DUR,KLOR-CON) 20 MEQ tablet TAKE 1 TABLET BY MOUTH DAILY (Patient taking differently: TAKE 20 MEQ BY MOUTH DAILY) 90 tablet 1  . prochlorperazine (COMPAZINE) 10 MG tablet TAKE 1 TABLET BY MOUTH EVERY 6 HOURS AS NEEDED FOR NAUSEA OR VOMITING (Patient not taking: Reported on 09/11/2015) 30 tablet 1   No current facility-administered medications for this encounter.   Facility-Administered Medications Ordered in Other Encounters    Medication Dose Route Frequency Provider Last Rate Last Dose  . heparin lock flush 100 unit/mL  500 Units Intravenous Once Ladell Pier, MD      . sodium chloride flush (NS) 0.9 % injection 10 mL  10 mL Intravenous PRN Ladell Pier, MD        Physical Findings: The patient is in no acute distress. Patient is alert and oriented.  oral temperature is 98.8 F (37.1 C). Her blood pressure is 123/81 and her pulse is 115. Her respiration is 18 and oxygen saturation is 98%. .   The patient  has good strength bilaterally in the lower extremities. Sitting in a wheelchair.  Lab Findings: Lab Results  Component Value Date   WBC 10.2 09/11/2015   HGB 11.6 09/11/2015   HCT 36.1 09/11/2015   MCV 88.1 09/11/2015   PLT 198 09/11/2015     Radiographic Findings: Ct Chest W Contrast  08/19/2015  CLINICAL DATA:  Metastatic colon cancer. EXAM: CT CHEST WITH CONTRAST TECHNIQUE: Multidetector CT imaging of the chest was performed during intravenous contrast administration. CONTRAST:  69m ISOVUE-300 IOPAMIDOL (ISOVUE-300) INJECTION 61% COMPARISON:  06/10/2015 FINDINGS: Mediastinum: The heart size appears normal. Aortic atherosclerosis noted. The trachea appears patent and is midline. Unremarkable appearance of the esophagus. No mediastinal or hilar adenopathy. Lungs/Pleura: No pleural fluid. Multiple pulmonary masses are again identified. Index lesion within the left upper lobe measures 5.1 x 3.7 cm, image 46 of series 5. Previously 4.1 x 3.1 cm. Index lesion within the superior segment of left lower lobe measures 2.6 x 1.9 cm, image 56 of series 5. Previously 1.7 x 1.4 cm. There is an index lesion within the right middle lobe which measures 4.4 by 3.9 cm, image 73 of series 5. Previously 3.4 x 3.2 cm. Multiple new small nodules are identified in both lungs. For example, there are two new nodules are identified in the right lower lobe, image 87 of series 5. Upper Abdomen: No suspicious liver abnormality  identified. Normal appearance of the spleen. The left adrenal gland is normal. New nodule in the right adrenal gland measures 1.8 by 1.5 cm, image number 88 of series 2. Musculoskeletal: Interval fracture of the T6 vertebra containing large lytic lesion, image 39 of series 2. IMPRESSION: 1. Interval progression of pulmonary metastasis. 2. New right adrenal gland nodule worrisome for metastatic disease 3. Interval pathologic fracture of T6 vertebra which contains a large lytic lesion. These results will be called to the ordering clinician or representative by the Radiologist Assistant, and communication documented in the PACS or zVision Dashboard. Electronically Signed   By: TKerby MoorsM.D.   On: 08/19/2015 14:43   Mr Thoracic Spine W Wo Contrast  08/21/2015  CLINICAL DATA:  Metastatic carcinoma of colon.  Back pain EXAM: MRI THORACIC SPINE WITHOUT AND WITH CONTRAST TECHNIQUE: Multiplanar and multiecho pulse sequences of the thoracic spine were obtained without and with intravenous contrast. CONTRAST:  210mMULTIHANCE GADOBENATE DIMEGLUMINE 529 MG/ML IV SOLN COMPARISON:  CT chest 08/19/2015 FINDINGS: SRS protocol with thin sections obtained through the fracture at T6 Pathologic fracture T6 due to tumor. Mild fracture. Enhancing soft tissue in the ventral epidural space consistent with epidural tumor, left greater than right. This extends into the left foramen at T6-7. Mild to moderate spinal stenosis. No cord compression. Cord signal abnormality. No other fracture or metastatic deposit identified in the thoracic spine. Mild thoracic disc degeneration.  No disc protrusion identified. IMPRESSION: Pathologic fracture T6 due to tumor. 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 No cord compression. Electronically Signed   By: ChFranchot Gallo.D.   On: 08/21/2015 10:38    Impression/Plan:  The patient has resumed Decadron. I discussed this in detail with her. She will take this  medicine 3 times per day and she will make sure that she is not taking this medicine twice given her other samples. I have called in a refill for her so she will have plenty. I discussed with medical oncology this plan and she will be checked on again later this week on Friday.  If she does not have improvement in her pain, then a MRI scan may be reasonable at that time to further explore the source of these new symptoms.

## 2015-09-11 NOTE — Progress Notes (Signed)
Provided patient with leg strengthening exercises approved by Dr. Benay Spice. Patient to check with Dr. Rush Farmer (orthopedic) about seeing a physical therapist. Patient is okay to try the exercises provided but understands to stop if any pain occurs and wait to see Dr. Rush Farmer. Pt verbalized all understanding

## 2015-09-11 NOTE — Progress Notes (Addendum)
[facility] OFFICE PROGRESS NOTE   Diagnosis:  Colon cancer  INTERVAL HISTORY:   Ms. Bert returns as scheduled. She began cycle 2 Regorafenib on 08/29/2015.  She contacted the office on 08/30/2015 to report recurrent severe back pain. She had mistakenly discontinued dexamethasone on 08/28/2015. She was instructed to resume dexamethasone 8 mg twice daily through the weekend and to begin 4 mg twice daily on 09/02/2015. On 09/05/2015 she reported that her pain was controlled. Dexamethasone was decreased to once daily.  She reports continued left lateral back pain extending to the beneath the left breast. She is unsure as to if she took dexamethasone as outlined above. 1-2 weeks ago she began to notice progressive leg weakness. She is having difficulty walking. She has recently noted numbness of the left lower leg. She is controlling urine and bowels without difficulty. No shortness of breath. No chest pain.  Objective:  Vital signs in last 24 hours:  Blood pressure 124/49, pulse 104, temperature 98.6 F (37 C), temperature source Oral, resp. rate 18, height '5\' 11"'$  (1.803 m), SpO2 97 %.    HEENT: No thrush or ulcers. Resp: Lungs clear bilaterally. Cardio: Regular rate and rhythm. GI: Abdomen soft and nontender. Vascular: No leg edema. Neuro: Weakness right proximal leg. Leg strength otherwise is intact. Patellar and biceps DTRs present but diminished. Port-A-Cath without erythema.   Lab Results:  Lab Results  Component Value Date   WBC 10.2 09/11/2015   HGB 11.6 09/11/2015   HCT 36.1 09/11/2015   MCV 88.1 09/11/2015   PLT 198 09/11/2015   NEUTROABS 8.4* 09/11/2015    Imaging:  No results found.  Medications: I have reviewed the patient's current medications.  Assessment/Plan: 1. Metastatic colon cancer (T4, N0, M1) adenocarcinoma of the sigmoid colon status post sigmoid colectomy and partial right hepatectomy 04/11/2012. Microsatellite stable. No loss of  mismatch repair proteins. K-ras wild-type on the initial codon 12 and 13 testing. K-ras Q61L and APC mutations identified on extended testing. BRAF not mutated. No HER-2 amplification on [facility] 1 testing.  She completed cycle 1 CAPOX beginning 05/25/2012.   She completed cycle 8 CAPOX beginning 10/19/2012.   CEA on 10/19/2012 normal at 0.8.   CEA 6.1 on 01/11/2013   Restaging CT on 01/11/2013 consistent with multiple new pulmonary metastases   Cycle 1 of FOLFIRI/Avastin 01/18/2013; cycle 2 02/01/2013,cycle 3 02/16/2013.   CEA 5.0 on 03/01/2013.   Cycle 4 FOLFIRI/Avastin 03/01/2013.   Cycle 5 FOLFIRI/Avastin 03/15/2013.   Restaging chest CT 03/29/2013 with possible slight decrease in size of a left upper lobe metastasis. Other pulmonary nodules stable.   Cycle 6 FOLFIRI/Avastin 03/29/2013   Cycle 7 of FOLFIRI/Avastin 04/12/2013.   CEA 6.3 05/03/2013.   Cycle 8 FOLFIRI/Avastin 05/03/2013.   Cycle 9 FOLFIRI/Avastin 05/17/2013.   Cycle 10 FOLFIRI/Avastin 05/31/2013.   CEA 10.6 on 06/19/2013.   CT chest 06/19/2013 with mild progression of bilateral pulmonary nodules/metastases.   Decision made to continue FOLFIRI/Avastin. She completed cycle 11 06/21/2013.   Cycle 12 FOLFIRI/Avastin 07/05/2013   Cycle 13 FOLFIRI/Avastin 07/19/2013   Cycle 14 FOLFIRI/Avastin 08/01/2013.   CT chest 08/28/2013 with increased in size of bilateral lung nodules.   PET scan 09/22/2013 with multiple hypermetabolic pulmonary nodules bilaterally. Nodules demonstrate progressive enlargement compared with prior CTs. No evidence of extrathoracic metastatic disease.   Status post SRS 2 lung nodules, completed 10/18/2013  CT 01/12/2014 with airspace disease surrounding treated pulmonary nodules, no new nodules  01/12/2014 CEA 0.9  CT chest  04/12/2014 with improvement in airspace disease surrounding treated pulmonary nodules, no new nodules  04/12/2014 CEA  8.2  07/03/2014 CEA 60.3  CT 07/03/2014 with masslike enlargement of 2 treated lung lesions  Cycle 1 Lonsurf 07/12/2014.  Cycle 2 Lonsurf 08/11/2014 or 08/12/2014  Cycle 3 Lonsurf 09/08/2014  Cycle 4 Lonsurf 10/06/2014  Restaging CT of the chest on 10/01/2014 with stable lung nodules  Cycle 5 Lonsurf 11/05/2014  Cycle 6 Lonsurf 12/03/2014  Restaging CTs of the chest, abdomen, and pelvis on 12/26/2014 with no evidence of disease progression, stable lung nodules  Cycle 7 Lonsurf 12/31/2014  Cycle 8 Lonsurf 01/28/2015  Elevated CEA 03/12/2015  CT 03/19/2015 with enlargement of bilateral lung masses and progressive bone metastases including an enlarging lesion at T6 with replacement of the vertebral body  Cycle 1 FOLFOX/Avastin 03/25/2015  Cycle 2 FOLFOX/Avastin 04/08/2015  Cycle 3 FOLFOX/Avastin 04/23/2015  Cycle 4 FOLFOX/Avastin 05/08/2015  Cycle 5 FOLFOX/Avastin 05/22/2015  Restaging CT scans 06/10/2015 with decreased size of bilateral pulmonary metastases; stable hepatic steatosis and postoperative changes from partial hepatectomy; no definite liver metastases; mild increase in size of lytic bone metastasis in the right ilium; stable T6 vertebral body lytic metastasis  CT right femur 06/12/2015 with lesion in the right femur, status post placement of an intramedullary nail 06/15/2015  Cycle 1 Regorafenib beginning 08/02/2015 (prematurely discontinued)  Pathologic T6 compression fracture 08/19/2015 status post [facility] 08/26/2015  Cycle 2 Regorafenib beginning 08/29/2015 (dose reduced to 80 mg daily for 21 days followed by a 7 day break) 2. Microcytic anemia, iron deficiency. Improved.  3. Status post Port-A-Cath placement 03/14/2012. X-ray showed the Port-A-Cath tip to be in the azygos vein. The left-sided Port-A-Cath was removed and a new right Port-A-Cath was placed on 05/23/2012. 4. 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 Xarelto discontinued April 2017 due to rectal bleeding. 5. Hypertension-persistent on amlodipine. HCTZ added 12/26/2014 6. Delayed nausea following cycle 5 FOLFIRI/Avastin. Emend was added to the premedication regimen 7. Nausea secondary to Lonsurf-improved with Ativan 8. Mild oxaliplatin neuropathy 9. Right groin pain. Question referred pain from the hip. 10. CT right femur 06/12/2015 with a partially visualized 4.3 x 2.3 cm lucent lesion in the inferior aspect of the right iliac bone superior to the right acetabulum. Associated destruction of the cortex of the bone; second lesion noted in the midportion of the right femur measuring 1.7 x 6.6 cm. Partial erosive changes of the cortex of the bone adjacent to the lesion.   Status post placement of an intramedullary nail right femur 06/15/2015.   She completed palliative radiation to the right femur and right pelvis 07/04/2015 through 07/19/2015. 11. Difficulty sleeping. She will try temazepam. 12. Anemia secondary to chronic disease, chemotherapy and rectal bleeding   Disposition: Ms. Riquelme is completing cycle 2 Regorafenib. She presents today with back pain and leg weakness in the setting of a recent pathologic T6 compression fracture status post [facility] 08/26/2015. It is unclear if she has been taking dexamethasone. She received a dose of dexamethasone 20 mg IV in the office today. Dr. Truett Perna has spoken with Dr. Mitzi Hansen. She will be evaluated in radiation oncology.  I spoke with Dr. Mitzi Hansen following his evaluation of Ms. Leavy Cella. She will begin dexamethasone 4 mg 3 times daily. If her symptoms do not improve over the next 2-3 days the plan is to obtain an MRI.  She will return for a follow-up visit 09/20/2015. She understands to contact the office  in the interim with any problems. We specifically discussed persistent/worsening back pain, leg weakness. She expressed understanding.   Patient seen with Dr. Benay Spice. 25 minutes  were spent face-to-face at today's visit with the majority of that time involved in counseling/coordination of care.   Ned Card ANP/GNP-BC   09/11/2015  3:59 PM  This was a shared visit with Ned Card. Ms. Hinote was interviewed and examined. We are concerned the leg weakness is related to radiation edema or disease progression at the thoracic spine. She was given high-dose Decadron and will begin Decadron at home. It is unclear how much Decadron she has been taking over the past week.  She was referred to Dr. Lisbeth Renshaw today.  Julieanne Manson, M.D.

## 2015-09-11 NOTE — Telephone Encounter (Signed)
per pof to sch pt appt-cld & spoke to pt and gave pt time & date of appt

## 2015-09-13 ENCOUNTER — Telehealth: Payer: Self-pay | Admitting: *Deleted

## 2015-09-13 ENCOUNTER — Encounter: Payer: Self-pay | Admitting: Radiation Oncology

## 2015-09-13 NOTE — Telephone Encounter (Signed)
Left message on voicemail for pt to call with update. (Is pain any better since resuming Decadron?)

## 2015-09-16 ENCOUNTER — Other Ambulatory Visit: Payer: Self-pay | Admitting: *Deleted

## 2015-09-16 ENCOUNTER — Other Ambulatory Visit: Payer: Self-pay | Admitting: Oncology

## 2015-09-16 NOTE — Telephone Encounter (Signed)
TC from patient requesting refill on her Dilaudid tablets. Lasty filled on 09/05/15 for 60 tabs. Pt has 5 tablets left. Please call patient when prescription is ready-patient has to arrange [facility] transportation to come to cancer center.

## 2015-09-16 NOTE — Telephone Encounter (Signed)
Returned call to pt, she reports the pain persists. She is taking Decadron three times daily and can tell some difference. Encouraged her to continue steroid, call office if pain worsens. Informed her she can pick up Rx between 0830 and 1600. She voiced understanding.

## 2015-09-18 ENCOUNTER — Other Ambulatory Visit: Payer: Self-pay | Admitting: *Deleted

## 2015-09-18 MED FILL — HYDROmorphone HCL 4 MG TABS: 4 | 13 days supply | Qty: 75 | Fill #0

## 2015-09-19 ENCOUNTER — Encounter: Payer: Self-pay | Admitting: General Practice

## 2015-09-19 NOTE — Progress Notes (Signed)
Telephone call  Counselor followed up with client to see if she wanted to schedule counseling appointment. Client reported that she is not feeling up to coming into counseling at this point. Counselor offered phone counseling if that would be easier, and the client stated she would think about it and call back if she wanted to pursue that.  Wendall Papa, MS, [place], [facility] Counseling Intern-Department for Spiritual Care and [place], [place], [place]

## 2015-09-20 ENCOUNTER — Ambulatory Visit (HOSPITAL_COMMUNITY)
Admission: RE | Admit: 2015-09-20 | Discharge: 2015-09-20 | Disposition: A | Discharge status: Home / Self Care | Payer: Medicare Other | Source: Ambulatory Visit | Attending: Nurse Practitioner | Admitting: Nurse Practitioner

## 2015-09-20 ENCOUNTER — Encounter: Payer: Self-pay | Admitting: *Deleted

## 2015-09-20 ENCOUNTER — Telehealth: Payer: Self-pay | Admitting: Nurse Practitioner

## 2015-09-20 ENCOUNTER — Encounter: Payer: Self-pay | Admitting: Nurse Practitioner

## 2015-09-20 ENCOUNTER — Other Ambulatory Visit (HOSPITAL_BASED_OUTPATIENT_CLINIC_OR_DEPARTMENT_OTHER): Payer: Medicare Other

## 2015-09-20 ENCOUNTER — Ambulatory Visit (HOSPITAL_BASED_OUTPATIENT_CLINIC_OR_DEPARTMENT_OTHER): Payer: Medicare Other | Admitting: Nurse Practitioner

## 2015-09-20 VITALS — BP 144/91 | HR 96 | Temp 98.9°F | Resp 18 | Ht 71.0 in

## 2015-09-20 DIAGNOSIS — M545 Low back pain: Secondary | ICD-10-CM | POA: Diagnosis not present

## 2015-09-20 DIAGNOSIS — I10 Essential (primary) hypertension: Secondary | ICD-10-CM | POA: Diagnosis not present

## 2015-09-20 DIAGNOSIS — C787 Secondary malignant neoplasm of liver and intrahepatic bile duct: Principal | ICD-10-CM

## 2015-09-20 DIAGNOSIS — C7802 Secondary malignant neoplasm of left lung: Secondary | ICD-10-CM

## 2015-09-20 DIAGNOSIS — D6481 Anemia due to antineoplastic chemotherapy: Secondary | ICD-10-CM | POA: Diagnosis not present

## 2015-09-20 DIAGNOSIS — C7951 Secondary malignant neoplasm of bone: Secondary | ICD-10-CM

## 2015-09-20 DIAGNOSIS — C7801 Secondary malignant neoplasm of right lung: Secondary | ICD-10-CM | POA: Diagnosis not present

## 2015-09-20 DIAGNOSIS — C187 Malignant neoplasm of sigmoid colon: Secondary | ICD-10-CM

## 2015-09-20 DIAGNOSIS — M79605 Pain in left leg: Secondary | ICD-10-CM

## 2015-09-20 DIAGNOSIS — D500 Iron deficiency anemia secondary to blood loss (chronic): Secondary | ICD-10-CM

## 2015-09-20 DIAGNOSIS — C189 Malignant neoplasm of colon, unspecified: Secondary | ICD-10-CM

## 2015-09-20 DIAGNOSIS — C7800 Secondary malignant neoplasm of unspecified lung: Secondary | ICD-10-CM | POA: Diagnosis not present

## 2015-09-20 DIAGNOSIS — D638 Anemia in other chronic diseases classified elsewhere: Secondary | ICD-10-CM | POA: Diagnosis not present

## 2015-09-20 LAB — COMPREHENSIVE METABOLIC PANEL
ALBUMIN: 3.3 g/dL — AB (ref 3.5–5.0)
ALK PHOS: 100 U/L (ref 40–150)
ALT: 70 U/L — ABNORMAL HIGH (ref 0–55)
ANION GAP: 9 meq/L (ref 3–11)
AST: 33 U/L (ref 5–34)
BILIRUBIN TOTAL: 0.46 mg/dL (ref 0.20–1.20)
BUN: 27.5 mg/dL — ABNORMAL HIGH (ref 7.0–26.0)
CO2: 24 mEq/L (ref 22–29)
Calcium: 9.7 mg/dL (ref 8.4–10.4)
Chloride: 104 mEq/L (ref 98–109)
Creatinine: 0.8 mg/dL (ref 0.6–1.1)
GLUCOSE: 138 mg/dL (ref 70–140)
POTASSIUM: 4.5 meq/L (ref 3.5–5.1)
SODIUM: 137 meq/L (ref 136–145)
Total Protein: 7.2 g/dL (ref 6.4–8.3)

## 2015-09-20 LAB — CBC WITH DIFFERENTIAL/PLATELET
BASO%: 0.3 % (ref 0.0–2.0)
Basophils Absolute: 0 10*3/uL (ref 0.0–0.1)
EOS%: 0 % (ref 0.0–7.0)
Eosinophils Absolute: 0 10*3/uL (ref 0.0–0.5)
HCT: 38.8 % (ref 34.8–46.6)
HEMOGLOBIN: 12.4 g/dL (ref 11.6–15.9)
LYMPH#: 0.6 10*3/uL — AB (ref 0.9–3.3)
LYMPH%: 4.6 % — ABNORMAL LOW (ref 14.0–49.7)
MCH: 27.9 pg (ref 25.1–34.0)
MCHC: 32 g/dL (ref 31.5–36.0)
MCV: 87.2 fL (ref 79.5–101.0)
MONO#: 0.3 10*3/uL (ref 0.1–0.9)
MONO%: 2 % (ref 0.0–14.0)
NEUT#: 12 10*3/uL — ABNORMAL HIGH (ref 1.5–6.5)
NEUT%: 93.1 % — ABNORMAL HIGH (ref 38.4–76.8)
PLATELETS: 207 10*3/uL (ref 145–400)
RBC: 4.45 10*6/uL (ref 3.70–5.45)
RDW: 18.9 % — AB (ref 11.2–14.5)
WBC: 12.9 10*3/uL — AB (ref 3.9–10.3)

## 2015-09-20 LAB — TECHNOLOGIST REVIEW

## 2015-09-20 NOTE — Telephone Encounter (Signed)
Gave pt apt & avs °

## 2015-09-20 NOTE — Progress Notes (Addendum)
[facility] OFFICE PROGRESS NOTE   Diagnosis:  Colon cancer  INTERVAL HISTORY:   [name] returns as scheduled. She continues dexamethasone 4 mg 3 times a day. She reports her back pain is controlled. She is taking hydromorphone 4 mg every 4 hours. She initially reported "numbness" involving the left lower leg, affecting balance. She later described significant pain affecting the left lower leg with ambulation. She continues to note weakness of the right leg. She denies nausea/vomiting. No mouth sores. No diarrhea. No rash. She has a good appetite. Her daughter brings her meals to her so she will not have to go up and down the stairs in her home. No bowel or bladder dysfunction.  Objective:  Vital signs in last 24 hours:  Blood pressure 144/91, pulse 96, temperature 98.9 F (37.2 C), temperature source Oral, resp. rate 18, height '5\' 11"'$  (1.803 m), SpO2 100 %.    HEENT: No thrush or ulcers. Resp: Lungs clear bilaterally. Cardio: Regular rate and rhythm. GI: Abdomen soft and nontender. Vascular: No leg edema. Neuro: Mild weakness right proximal leg. Leg strength otherwise intact.  Port-A-Cath without erythema.   Lab Results:  Lab Results  Component Value Date   WBC 12.9* 09/20/2015   HGB 12.4 09/20/2015   HCT 38.8 09/20/2015   MCV 87.2 09/20/2015   PLT 207 09/20/2015   NEUTROABS 12.0* 09/20/2015    Imaging:  No results found.  Medications: I have reviewed the patient's current medications.  Assessment/Plan: 1. Metastatic colon cancer (T4, N0, M1) adenocarcinoma of the sigmoid colon status post sigmoid colectomy and partial right hepatectomy 04/11/2012. Microsatellite stable. No loss of mismatch repair proteins. K-ras wild-type on the initial codon 12 and 13 testing. K-ras Q61L and APC mutations identified on extended testing. BRAF not mutated. No HER-2 amplification on Foundation 1 testing.  She completed cycle 1 CAPOX beginning 05/25/2012.   She  completed cycle 8 CAPOX beginning 10/19/2012.   CEA on 10/19/2012 normal at 0.8.   CEA 6.1 on 01/11/2013   Restaging CT on 01/11/2013 consistent with multiple new pulmonary metastases   Cycle 1 of FOLFIRI/Avastin 01/18/2013; cycle 2 02/01/2013,cycle 3 02/16/2013.   CEA 5.0 on 03/01/2013.   Cycle 4 FOLFIRI/Avastin 03/01/2013.   Cycle 5 FOLFIRI/Avastin 03/15/2013.   Restaging chest CT 03/29/2013 with possible slight decrease in size of a left upper lobe metastasis. Other pulmonary nodules stable.   Cycle 6 FOLFIRI/Avastin 03/29/2013   Cycle 7 of FOLFIRI/Avastin 04/12/2013.   CEA 6.3 05/03/2013.   Cycle 8 FOLFIRI/Avastin 05/03/2013.   Cycle 9 FOLFIRI/Avastin 05/17/2013.   Cycle 10 FOLFIRI/Avastin 05/31/2013.   CEA 10.6 on 06/19/2013.   CT chest 06/19/2013 with mild progression of bilateral pulmonary nodules/metastases.   Decision made to continue FOLFIRI/Avastin. She completed cycle 11 06/21/2013.   Cycle 12 FOLFIRI/Avastin 07/05/2013   Cycle 13 FOLFIRI/Avastin 07/19/2013   Cycle 14 FOLFIRI/Avastin 08/01/2013.   CT chest 08/28/2013 with increased in size of bilateral lung nodules.   PET scan 09/22/2013 with multiple hypermetabolic pulmonary nodules bilaterally. Nodules demonstrate progressive enlargement compared with prior CTs. No evidence of extrathoracic metastatic disease.   Status post SRS 2 lung nodules, completed 10/18/2013  CT 01/12/2014 with airspace disease surrounding treated pulmonary nodules, no new nodules  01/12/2014 CEA 0.9  CT chest 04/12/2014 with improvement in airspace disease surrounding treated pulmonary nodules, no new nodules  04/12/2014 CEA 8.2  07/03/2014 CEA 60.3  CT 07/03/2014 with masslike enlargement of 2 treated lung lesions  Cycle 1 Lonsurf 07/12/2014.  Cycle 2 Lonsurf 08/11/2014 or 08/12/2014  Cycle 3 Lonsurf 09/08/2014  Cycle 4 Lonsurf 10/06/2014  Restaging CT of the chest on 10/01/2014 with  stable lung nodules  Cycle 5 Lonsurf 11/05/2014  Cycle 6 Lonsurf 12/03/2014  Restaging CTs of the chest, abdomen, and pelvis on 12/26/2014 with no evidence of disease progression, stable lung nodules  Cycle 7 Lonsurf 12/31/2014  Cycle 8 Lonsurf 01/28/2015  Elevated CEA 03/12/2015  CT 03/19/2015 with enlargement of bilateral lung masses and progressive bone metastases including an enlarging lesion at T6 with replacement of the vertebral body  Cycle 1 FOLFOX/Avastin 03/25/2015  Cycle 2 FOLFOX/Avastin 04/08/2015  Cycle 3 FOLFOX/Avastin 04/23/2015  Cycle 4 FOLFOX/Avastin 05/08/2015  Cycle 5 FOLFOX/Avastin 05/22/2015  Restaging CT scans 06/10/2015 with decreased size of bilateral pulmonary metastases; stable hepatic steatosis and postoperative changes from partial hepatectomy; no definite liver metastases; mild increase in size of lytic bone metastasis in the right ilium; stable T6 vertebral body lytic metastasis  CT right femur 06/12/2015 with lesion in the right femur, status post placement of an intramedullary nail 06/15/2015  Cycle 1 Regorafenib beginning 08/02/2015 (prematurely discontinued)  Pathologic T6 compression fracture 08/19/2015 status post [facility] 08/26/2015  Cycle 2 Regorafenib beginning 08/29/2015 (dose reduced to 80 mg daily for 21 days followed by a 7 day break)  Cycle 3 Regorafenib beginning 09/26/2015 2. Microcytic anemia, iron deficiency. Improved.  3. Status post Port-A-Cath placement 03/14/2012. X-ray showed the Port-A-Cath tip to be in the azygos vein. The left-sided Port-A-Cath was removed and a new right Port-A-Cath was placed on 05/23/2012. 4. 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 Xarelto discontinued April 2017 due to rectal bleeding. 5. Hypertension-persistent on amlodipine. HCTZ added 12/26/2014 6. Delayed nausea following cycle 5 FOLFIRI/Avastin. Emend was added to the  premedication regimen 7. Nausea secondary to Lonsurf-improved with Ativan 8. Mild oxaliplatin neuropathy 9. Right groin pain. Question referred pain from the hip. 10. CT right femur 06/12/2015 with a partially visualized 4.3 x 2.3 cm lucent lesion in the inferior aspect of the right iliac bone superior to the right acetabulum. Associated destruction of the cortex of the bone; second lesion noted in the midportion of the right femur measuring 1.7 x 6.6 cm. Partial erosive changes of the cortex of the bone adjacent to the lesion.   Status post placement of an intramedullary nail right femur 06/15/2015.   She completed palliative radiation to the right femur and right pelvis 07/04/2015 through 07/19/2015. 11. Difficulty sleeping. She will try temazepam. 12. Anemia secondary to chronic disease, chemotherapy and rectal bleeding   Disposition: Ms. Jarvis appears unchanged. She has completed 2 cycles of Regorafenib. She will begin cycle 3 on 09/26/2015.  At today's visit she reports significant pain at the left lower leg with weightbearing. We are referring her for a plain x-ray of the tibia/fibula.  Back pain is better. We decreased the dexamethasone from 4 mg 3 times daily to 4 mg twice daily. She will continue hydromorphone as needed.  She is having difficulty with mobility due to the recent right femur surgery, back pain and now left lower leg pain. She is at risk of falling. She would benefit from physical and occupational therapy evaluations and also having a home health aide. We made referrals for home health occupational and physical therapy as well as an aide. She will likely need some equipment.  She will return for a follow-up visit and labs in 2 weeks. She will contact the  office in the interim with any problems.  Patient seen with Dr. Benay Spice. 25 minutes were spent face-to-face at today's visit with the majority of that time involved in counseling/coordination of care.    Ned Card ANP/GNP-BC   09/20/2015  12:31 PM  This was a shared visit with Ned Card. Ms. Walter continues to have leg weakness and pain. She will continue a slow Decadron taper. We will arrange for home PT/OT and a personal care assistant. She will continue regorafenib therapy.  Julieanne Manson, M.D.

## 2015-09-20 NOTE — Patient Instructions (Addendum)
   Decrease decadron to 4 mg twice a day  Begin cycle #3 Regorafenib on 09/26/2015 (2 tablets daily for 21 days then 7 day break)

## 2015-09-20 NOTE — Progress Notes (Signed)
Oncology Nurse Navigator Documentation  Oncology Nurse Navigator Flowsheets 09/20/2015  Navigator Location [facility]  Navigator Encounter Type Follow-up Appt  Telephone -  Patient Visit Type MedOnc  Treatment Phase Active Tx  Barriers/Navigation Needs Family concerns--more difficulty with ADLs  Education Other--administration of medications  Interventions Referrals  Referrals Home Health;Rehab--Home PT/OT and personal care aid  Coordination of Care -  Education Method Verbal;Written;Teach-back  Acuity -  Time Spent with Patient 60  Time Spent with Patient (Retired) -  [name] expressed desire for help in her home with ADL's due to weakness and pain. She has much difficulty ambulating even with the walker and is a high fall risk. Dr. Benay Spice has ordered home OT/PT as well as an aid for personal care assistance. [name] stated she wished to use [facility]. Faxed referral and chart information to [facility] [phone] (representative, Jocelyn Lamer said they could see her on 09/25/15 for initial visit).  Faxed referral and chart information to [facility] [place] [phone] for her personal care assistance aid since above company did not provide this service.  Reviewed with [name] how to taper her Decadron to twice daily and the regorafenib dose is 80 mg daily and she will begin on 09/26/15. She currently has enough medication for this cycle. She had an xray of her right leg and was negative for fracture. She told this RN she wants to continue treatment for her cancer.

## 2015-09-20 NOTE — Progress Notes (Signed)
[place] Work  Clinical Social Work was referred by need for CSW follow up. CSW met with pt in the lobby prior to appointment and provided emotional support and reassessment of needs. Pt shared getting her was hard today as riding [employer] is starting to become too hard. Pt took a cab today to try to ease the hardship of transportation. Pt shared getting around is getting "harder and harder". Pt could benefit from more support at home. CSW shared concerns with the medical team. CSW will continue to follow and assist accordingly.   Clinical Social Work interventions: Emotional support Resource assistance  [name], [place] Worker [place]  [place] Phone: [phone] Fax: [phone]

## 2015-09-25 ENCOUNTER — Telehealth: Payer: Self-pay | Admitting: *Deleted

## 2015-09-25 NOTE — Telephone Encounter (Signed)
Oncology Nurse Navigator Documentation  Oncology Nurse Navigator Flowsheets 09/25/2015  Navigator Location [facility]  Navigator Encounter Type Telephone  Telephone Outgoing Call;Patient Update  Patient Visit Type -  Treatment Phase -  Barriers/Navigation Needs -  Education -  Interventions -She reports physical therapy was out today and she thinks she will like him. Made her aware that OT won't get out there till next week due to staffing. Requested she call back if she has not heard from her agency regarding the personal care assistant/aid. She agrees.  Referrals -  Coordination of Care -  Education Method -  Acuity -  Time Spent with Patient 15

## 2015-09-25 NOTE — Telephone Encounter (Signed)
Received fax from [facility]: PT is scheduled to see pt today. OT evaluation will be delayed until late next week due to staffing. Dr. Benay Spice made aware.

## 2015-09-27 ENCOUNTER — Telehealth: Payer: Self-pay | Admitting: *Deleted

## 2015-09-27 DIAGNOSIS — C7951 Secondary malignant neoplasm of bone: Secondary | ICD-10-CM

## 2015-09-27 MED ORDER — HYDROMORPHONE HCL 4 MG PO TABS: ORAL_TABLET | ORAL | Status: DC

## 2015-09-27 NOTE — Telephone Encounter (Signed)
TC from patient requesting refill on her Dilaudid for her ongoing leg pain. This was last filled on 09/16/15 for 75 tablets. Pt is taking 1 tablet every 4 hours which would have been a 12 day supply. She has 5 tablets left. This is day 11. Prescription printed for MD signature.

## 2015-09-27 NOTE — Telephone Encounter (Signed)
Call from Santiago Glad, Jane with [facility] requesting verbal order for OT services. Verbal order given per Dr. Benay Spice.

## 2015-09-30 ENCOUNTER — Telehealth: Payer: Self-pay

## 2015-09-30 ENCOUNTER — Emergency Department (HOSPITAL_COMMUNITY)
Admission: EM | Admit: 2015-09-30 | Discharge: 2015-09-30 | Disposition: A | Discharge status: Home / Self Care | Payer: Medicare Other | Source: Home / Self Care | Attending: Emergency Medicine | Admitting: Emergency Medicine

## 2015-09-30 ENCOUNTER — Encounter (HOSPITAL_COMMUNITY): Payer: Self-pay | Admitting: *Deleted

## 2015-09-30 ENCOUNTER — Ambulatory Visit
Admit: 2015-09-30 | Discharge: 2015-09-30 | Disposition: A | Discharge status: Home / Self Care | Payer: Medicare Other | Attending: Radiation Oncology | Admitting: Radiation Oncology

## 2015-09-30 ENCOUNTER — Emergency Department (HOSPITAL_COMMUNITY): Payer: Medicare Other

## 2015-09-30 ENCOUNTER — Ambulatory Visit: Admit: 2015-09-30 | Payer: Medicare Other | Admitting: Radiation Oncology

## 2015-09-30 DIAGNOSIS — G893 Neoplasm related pain (acute) (chronic): Secondary | ICD-10-CM | POA: Diagnosis not present

## 2015-09-30 DIAGNOSIS — M79605 Pain in left leg: Secondary | ICD-10-CM | POA: Insufficient documentation

## 2015-09-30 DIAGNOSIS — G8929 Other chronic pain: Secondary | ICD-10-CM

## 2015-09-30 DIAGNOSIS — I10 Essential (primary) hypertension: Secondary | ICD-10-CM

## 2015-09-30 DIAGNOSIS — Z79899 Other long term (current) drug therapy: Secondary | ICD-10-CM | POA: Insufficient documentation

## 2015-09-30 DIAGNOSIS — M79602 Pain in left arm: Principal | ICD-10-CM

## 2015-09-30 DIAGNOSIS — Z8505 Personal history of malignant neoplasm of liver: Secondary | ICD-10-CM

## 2015-09-30 DIAGNOSIS — Z85038 Personal history of other malignant neoplasm of large intestine: Secondary | ICD-10-CM | POA: Insufficient documentation

## 2015-09-30 LAB — CBC WITH DIFFERENTIAL/PLATELET
BASOS ABS: 0 10*3/uL (ref 0.0–0.1)
Basophils Relative: 0 %
Eosinophils Absolute: 0 10*3/uL (ref 0.0–0.7)
Eosinophils Relative: 0 %
HEMATOCRIT: 37.9 % (ref 36.0–46.0)
Hemoglobin: 12.5 g/dL (ref 12.0–15.0)
LYMPHS PCT: 7 %
Lymphs Abs: 0.9 10*3/uL (ref 0.7–4.0)
MCH: 28.2 pg (ref 26.0–34.0)
MCHC: 33 g/dL (ref 30.0–36.0)
MCV: 85.6 fL (ref 78.0–100.0)
Monocytes Absolute: 0.7 10*3/uL (ref 0.1–1.0)
Monocytes Relative: 6 %
NEUTROS ABS: 11 10*3/uL — AB (ref 1.7–7.7)
NEUTROS PCT: 87 %
PLATELETS: 198 10*3/uL (ref 150–400)
RBC: 4.43 MIL/uL (ref 3.87–5.11)
RDW: 17.5 % — ABNORMAL HIGH (ref 11.5–15.5)
WBC: 12.6 10*3/uL — AB (ref 4.0–10.5)

## 2015-09-30 LAB — BASIC METABOLIC PANEL
ANION GAP: 9 (ref 5–15)
BUN: 25 mg/dL — ABNORMAL HIGH (ref 6–20)
CO2: 25 mmol/L (ref 22–32)
Calcium: 9.9 mg/dL (ref 8.9–10.3)
Chloride: 104 mmol/L (ref 101–111)
Creatinine, Ser: 0.71 mg/dL (ref 0.44–1.00)
GFR calc Af Amer: >60 mL/min (ref >60)
GLUCOSE: 101 mg/dL — AB (ref 65–99)
POTASSIUM: 4.2 mmol/L (ref 3.5–5.1)
Sodium: 138 mmol/L (ref 135–145)

## 2015-09-30 LAB — SEDIMENTATION RATE: Sed Rate: 45 mm/hr — ABNORMAL HIGH (ref 0–22)

## 2015-09-30 LAB — C-REACTIVE PROTEIN: CRP: <0.5 mg/dL (ref <1.0)

## 2015-09-30 MED ORDER — HYDROMORPHONE HCL 2 MG/ML IJ SOLN
2.0000 mg | Freq: Once | INTRAMUSCULAR | Status: AC
  Administered 2015-09-30: 2 mg via INTRAVENOUS
  Filled 2015-09-30: qty 1

## 2015-09-30 MED ORDER — ONDANSETRON HCL 4 MG/2ML IJ SOLN
4.0000 mg | Freq: Once | INTRAMUSCULAR | Status: AC
  Administered 2015-09-30: 4 mg via INTRAVENOUS
  Filled 2015-09-30: qty 2

## 2015-09-30 MED FILL — HYDROmorphone HCL 4 MG TABS: 4 | 13 days supply | Qty: 75 | Fill #0

## 2015-09-30 NOTE — Progress Notes (Signed)
DME wheelchair --wt 247lbs Ht 5'11"  Patient suffers from Left leg pain, Neuromuscular disorder of both feet, PMH of Metastatic colon cancer to liver which impairs their ability to perform daily activities like mobility, bathing, dressing and toileting in the home.  A cane nor crutch will not resolve  issue with performing activities of daily living. A wheelchair will allow patient to safely perform daily activities. Patient can safely propel the wheelchair in the home or has a caregiver who can provide assistance.  Accessories: elevating leg rests (ELRs), wheel locks, extensions and anti-tippers.

## 2015-09-30 NOTE — ED Notes (Addendum)
Pt sitting on side of the stretcher without difficulty.  States her son should be on his way to pick her up.  Bedside commode delivered

## 2015-09-30 NOTE — ED Notes (Signed)
Kim case mgr at bedside

## 2015-09-30 NOTE — ED Notes (Signed)
Patient transported to MRI 

## 2015-09-30 NOTE — Progress Notes (Signed)
ED CM spoke with Dr Tyrone Nine and Brenton Grills of [facility] [phone] about pt wanting w/c Order in and cosigned Pt made aware that she may have to call [facility] CM provided the number for [facility] for pt to check on delivery or pick up status by Wednesday

## 2015-09-30 NOTE — ED Notes (Signed)
Admitting nurse Maudie Mercury at bedside

## 2015-09-30 NOTE — Progress Notes (Signed)
Discussed pt DME with [facility] ED RN  Awaiting arrival of DME from [facility]

## 2015-09-30 NOTE — Telephone Encounter (Signed)
Pt lvm stating she was out of pain medication. Asking for someone to see her about her legs and she can hardly walk. Called pt back and LVM on cell and home phone. She has 145 pm appt today with Dr Lisbeth Renshaw. She can fill in walk in form to see Selena Lesser NP. She may have to wait some b/c this is a work in.

## 2015-09-30 NOTE — Telephone Encounter (Signed)
Pt went to ER d/t leg pain. She called [facility] from ER. She stated her inability to walk was from the leg pain. She has a dilaudid Rx written 6/2. She had no way to pick it up b/c she rides [place]. A friend will be picking her up from ER. Discussed having the friend come to [facility] or bring pt to [facility] to pick up her prescription. Pt had MRI lumbar spine done in ER.  She did not see Dr Lisbeth Renshaw today as scheduled.

## 2015-09-30 NOTE — ED Notes (Signed)
Pt reports LLE numbness and pain x 3 months.  States her oncologist Learta Codding is aware- was told she might have possible compressed disc and was put on pain med without relief.  Pt states her LLE is "dead".  L plantar and dorsiflexion is weak but is able to move.  Pt able to hold her L leg up longer than 5 seconds with a drift.

## 2015-09-30 NOTE — ED Notes (Signed)
Called pt's friend for transport-left a msg.  Pt aware.  Pt is also waiting for a bedside commode for home use

## 2015-09-30 NOTE — ED Notes (Signed)
Per [facility], pt from home, pt reports LLE pain and numbness x 2 weeks.  Pt is a ca ctr.  Reports new onset of immobility and numbness in her LLE.  L pedal pulse noted per [facility], pt was able to move her toes while saying she is not able to.

## 2015-09-30 NOTE — ED Provider Notes (Signed)
CSN: [id number]     Arrival date & time 09/30/15  1040 History   First MD Initiated Contact with Patient 09/30/15 1053     Chief Complaint  Patient presents with  . Leg Pain  . ca pt      (Consider location/radiation/quality/duration/timing/severity/associated sxs/prior Treatment) Patient is a 60 y.o. female presenting with leg pain. The history is provided by the patient.  Leg Pain Location:  Leg Time since incident:  2 days Injury: no   Leg location:  L upper leg and L lower leg Pain details:    Quality:  Shooting and sharp   Severity:  Severe   Onset quality:  Gradual   Duration:  2 months   Timing:  Constant   Progression:  Worsening Chronicity:  New Prior injury to area:  No Relieved by:  Nothing Worsened by:  Nothing tried Ineffective treatments:  None tried Associated symptoms: back pain   Associated symptoms: no fever    60 yo F With a chief complaint of left leg pain. Is been going on for the past couple months or so. Patient has a history of colon and breast cancer with diffuse metastasis. Patient has been seen multiple times for this pain. She had an MRI of her T-spine done a couple months ago and found to have a pathologic fracture of her T6. She is seen pain management is currently on 2 mg Dilaudid for this. She feels that her pain is getting much worse feels that it is like a tingling pain. Worse with walking. Having trouble getting around today worse than normal. Denies any injury.  Past Medical History  Diagnosis Date  . Hypertension     pt states she stopped taking meds while in her 20's  . Blood in stool   . History of blood transfusion   . Anemia   . Eczema   . Heart murmur     does not see a cardiologist has been told she does has a murmur and also that she does not have a murmur  . Anxiety   . Neuromuscular disorder (HCC)     numbness feet  . Hx of radiation therapy 10/06/13,16th,19th,22nd,&10/18/13    SBRT lung  . Cancer ([place])   . Metastatic colon  cancer to liver [facility])    Past Surgical History  Procedure Laterality Date  . Breast lumpectomy      left breast  . Colonoscopy  02/18/2012    Procedure: COLONOSCOPY;  Surgeon: Beryle Beams, MD;  Location: [place];  Service: Endoscopy;  Laterality: N/A;  . Portacath placement  03/14/2012    Procedure: INSERTION PORT-A-CATH;  Surgeon: Ralene Ok, MD;  Location: [place];  Service: General;  Laterality: N/A;  . Laparoscopic sigmoid colectomy  04/11/2012    Procedure: LAPAROSCOPIC SIGMOID COLECTOMY;  Surgeon: Stark Klein, MD;  Location: [place];  Service: General;;  converted to Open with splenic flexure take down  . Open partial hepatectomy [83]  04/11/2012    Procedure: OPEN PARTIAL HEPATECTOMY [83];  Surgeon: Stark Klein, MD;  Location: [place];  Service: General;  Laterality: Right;  . Laparoscopy  04/11/2012    Procedure: LAPAROSCOPY DIAGNOSTIC;  Surgeon: Stark Klein, MD;  Location: [place];  Service: General;  Laterality: N/A;  . Cholecystectomy  04/11/2012    Procedure: CHOLECYSTECTOMY;  Surgeon: Stark Klein, MD;  Location: [place];  Service: General;;  . Ileocecetomy  04/11/2012    Procedure: Pennie Rushing;  Surgeon: Stark Klein, MD;  Location: [place];  Service: General;  Laterality: N/A;  Shanda Howells a cath revision  05/23/2012    Procedure: PORT A CATH REVISION;  Surgeon: Ralene Ok, MD;  Location: [place];  Service: General;  Laterality: Right;  Port-A-Cath repositioning versus replacement  . Abdominal hysterectomy      early 1's  . Femur im nail Right 06/15/2015    Procedure: INTRAMEDULLARY (IM) NAIL INTERTROCHANTERIC ;  Surgeon: Mcarthur Rossetti, MD;  Location: [facility];  Service: Orthopedics;  Laterality: Right;   Family History  Problem Relation Age of Onset  . Hypertension Mother   . Cancer Mother 41    breast ca  . Heart disease Father    Social History  Substance Use Topics  . Smoking status: Never Smoker   . Smokeless tobacco: Never Used  . Alcohol Use: No    OB History    No data available     Review of Systems  Constitutional: Negative for fever and chills.  HENT: Negative for congestion and rhinorrhea.   Eyes: Negative for redness and visual disturbance.  Respiratory: Negative for shortness of breath and wheezing.   Cardiovascular: Negative for chest pain and palpitations.  Gastrointestinal: Negative for nausea and vomiting.  Genitourinary: Negative for dysuria and urgency.  Musculoskeletal: Positive for back pain. Negative for myalgias and arthralgias.  Skin: Negative for pallor and wound.  Neurological: Negative for dizziness and headaches.      Allergies  Review of patient's allergies indicates no known allergies.  Home Medications   Prior to Admission medications   Medication Sig Start Date End Date Taking? Authorizing Provider  amLODipine (NORVASC) 10 MG tablet TAKE 1 TABLET BY MOUTH EVERY DAY Patient taking differently: TAKE 10 MG BY MOUTH EVERY DAY 08/23/15  Yes Ladell Pier, MD  dexamethasone (DECADRON) 4 MG tablet Take 1 tablet (4 mg total) by mouth 3 (three) times daily. 09/11/15  Yes Kyung Rudd, MD  diclofenac sodium (VOLTAREN) 1 % GEL Apply 2 g topically 4 (four) times daily. Patient taking differently: Apply 2 g topically 4 (four) times daily as needed (port).  05/01/15  Yes Iline Oven, MD  hydrochlorothiazide (MICROZIDE) 12.5 MG capsule Take 1 capsule (12.5 mg total) by mouth daily. 12/26/14  Yes Ladell Pier, MD  HYDROmorphone (DILAUDID) 4 MG tablet TAKE 1 TABLET BY MOUTH EVERY 4 HOURS AS NEEDED FOR MODERATE OR SEVERE PAIN Patient taking differently: Take 4 mg by mouth every 4 (four) hours as needed for moderate pain or severe pain.  09/27/15  Yes Ladell Pier, MD  IRON PO Take 1 tablet by mouth daily.   Yes Historical Provider, MD  lidocaine-prilocaine (EMLA) cream Apply topically as needed. Apply to [facility] site 1-2 hours prior to stick and cover with plastic wrap to numb site 08/16/13  Yes Owens Shark, NP   Multiple Vitamins-Minerals (MULTIVITAMIN) tablet Take 1 tablet by mouth daily. 01/22/14  Yes Ladell Pier, MD  polyethylene glycol powder (MIRALAX) powder Take 17 g by mouth daily as needed. Patient taking differently: Take 17 g by mouth daily as needed for mild constipation or moderate constipation.  06/16/12  Yes Owens Shark, NP  loperamide (IMODIUM) 2 MG capsule TAKE ONE CAPSULE BY MOUTH AS NEEDED FOR DIARRHEA OR LOOSE STOOLS. MAX OF 8 CAPSULES PER DAY Patient not taking: Reported on 09/11/2015 05/17/15   Ladell Pier, MD  LORazepam (ATIVAN) 0.5 MG tablet Place 1 tablet (0.5 mg total) under the tongue every 6 (six) hours as needed (for  nausea). Causes drowsiness Patient not taking: Reported on 08/28/2015 05/08/15   Owens Shark, NP  ondansetron (ZOFRAN) 8 MG tablet TAKE 1 TABLET BY MOUTH EVERY 8 HOURS AS NEEDED FOR NAUSEA AND VOMITING Patient not taking: Reported on 09/11/2015 11/29/14   Ladell Pier, MD  potassium chloride SA (K-DUR,KLOR-CON) 20 MEQ tablet TAKE 1 TABLET BY MOUTH DAILY Patient not taking: Reported on 09/30/2015 07/29/15   Ladell Pier, MD  prochlorperazine (COMPAZINE) 10 MG tablet TAKE 1 TABLET BY MOUTH EVERY 6 HOURS AS NEEDED FOR NAUSEA OR VOMITING Patient not taking: Reported on 09/11/2015 08/06/14   Owens Shark, NP   BP 159/103 mmHg  Pulse 77  Temp(Src) 99 F (37.2 C) (Oral)  Resp 18  SpO2 100% Physical Exam  Constitutional: She is oriented to person, place, and time. She appears well-developed and well-nourished. No distress.  HENT:  Head: Normocephalic and atraumatic.  Eyes: EOM are normal. Pupils are equal, round, and reactive to light.  Neck: Normal range of motion. Neck supple.  Cardiovascular: Normal rate and regular rhythm.  Exam reveals no gallop and no friction rub.   No murmur heard. Pulmonary/Chest: Effort normal. She has no wheezes. She has no rales.  Abdominal: Soft. She exhibits no distension. There is no tenderness.  Musculoskeletal: She exhibits  no edema or tenderness.  Neurological: She is alert and oriented to person, place, and time. She displays no Babinski's sign on the right side. She displays no Babinski's sign on the left side.  Reflex Scores:      Patellar reflexes are 2+ on the right side and 3+ on the left side.      Achilles reflexes are 2+ on the right side and 3+ on the left side. Equal lower extremity strength with hip flexion. Patient is unable to dorsiflex the left lower extremity. Unable to elevate her great toe. Pulse and sensation is intact.  Skin: Skin is warm and dry. She is not diaphoretic.  Psychiatric: She has a normal mood and affect. Her behavior is normal.  Nursing note and vitals reviewed.   ED Course  Procedures (including critical care time) Labs Review Labs Reviewed  CBC WITH DIFFERENTIAL/PLATELET - Abnormal; Notable for the following:    WBC 12.6 (*)    RDW 17.5 (*)    Neutro Abs 11.0 (*)    All other components within normal limits  BASIC METABOLIC PANEL - Abnormal; Notable for the following:    Glucose, Bld 101 (*)    BUN 25 (*)    All other components within normal limits  SEDIMENTATION RATE - Abnormal; Notable for the following:    Sed Rate 45 (*)    All other components within normal limits  C-REACTIVE PROTEIN    Imaging Review Mr Lumbar Spine Wo Contrast  09/30/2015  CLINICAL DATA:  Left lower extremity pain and numbness, 2 weeks duration. Patient with history of metastatic cancer. Colon carcinoma. Being treated with chemotherapy. EXAM: MRI LUMBAR SPINE WITHOUT CONTRAST TECHNIQUE: Multiplanar, multisequence MR imaging of the lumbar spine was performed. No intravenous contrast was administered. COMPARISON:  Radiography 08/03/2015.  CT abdomen 08/04/2015. FINDINGS: Segmentation:  S1 is transitional. Alignment:  Normal Vertebrae: There is a metastatic lesion within the central portion of the L3 vertebra measuring 2.7 cm in diameter. Minimal superior endplate depression probably secondary to  this. No extraosseous tumor or any encroachment upon the nerves or neural spaces. There is tumor involvement within the right pedicle and transverse process at L5. More extensive  tumor involvement is seen affecting the transitional S1 segment, particularly along the left side with extension into the left pedicle and posterior elements. There may be minimal ventral epidural tumor but this does not appear to affect the neural structures. Tumor is also present extensively within the S2 segment on the left. Left sacral ala as extensively involved with tumor. I do not demonstrate any compressive effect upon the sacral nerve roots, but there is mild encroachment upon the left sacral foramina that could possibly be symptomatic. Conus medullaris: Extends to the L2-3 level and appears normal. Paraspinal and other soft tissues: Negative Disc levels: T12-L1 and L1-2 are normal. L2-3: Minimal bulging of the disc. No stenosis or neural compression. Minimal facet hypertrophy. L3-4:  No disc pathology.  Minimal facet hypertrophy.  No stenosis. L4-5: Facet degeneration with 1 mm of anterolisthesis. Minimal bulging of the disc. No stenosis. L5-S1: Minimal bulging of the disc. Mild facet hypertrophy. No stenosis. IMPRESSION: No spinal stenosis or degenerative disc disease of significance. Lower lumbar facet osteoarthritis, mild to moderate in degree, which could be associated with low back pain or facet syndrome pain. Osseous metastatic disease demonstrated affecting the L3 vertebral body, L5 posterior elements on the right, and extensively involving the S1 and S2 levels on the left. There is minimal ventral epidural tumor at S1. There is mild tumor encroachment upon the intervertebral foramina in the sacral region on the left. Definite neural compression is not demonstrated, but the left-sided sacral nerve roots could be affected or irritated. Electronically Signed   By: Nelson Chimes M.D.   On: 09/30/2015 13:30   I have personally  reviewed and evaluated these images and lab results as part of my medical decision-making.   EKG Interpretation None      MDM   Final diagnoses:  Chronic leg pain, left    60 yo F with a chief complaint of left leg pain. This is a chronic problem for this patient. However she had had significant worsening and now is unable to dorsiflex her foot. Difficult to ascertain whether or not this is secondary to pain or if she is unable to do this. We'll obtain an MRI of the L-spine.  MR without acute cord compression, will have her follow up with her oncologist for pain management.   2:18 PM:  I have discussed the diagnosis/risks/treatment options with the patient and believe the pt to be eligible for discharge home to follow-up with Oncology. We also discussed returning to the ED immediately if new or worsening sx occur. We discussed the sx which are most concerning (e.g., sudden worsening pain, fever, inability to tolerate by mouth, cauda equina s/sx) that necessitate immediate return. Medications administered to the patient during their visit and any new prescriptions provided to the patient are listed below.  Medications given during this visit Medications  HYDROmorphone (DILAUDID) injection 2 mg (2 mg Intravenous Given 09/30/15 1146)  ondansetron (ZOFRAN) injection 4 mg (4 mg Intravenous Given 09/30/15 1144)    New Prescriptions   No medications on file    The patient appears reasonably screen and/or stabilized for discharge and I doubt any other medical condition or other [facility] requiring further screening, evaluation, or treatment in the ED at this time prior to discharge.    Deno Etienne, DO 09/30/15 1418

## 2015-09-30 NOTE — Discharge Instructions (Signed)

## 2015-09-30 NOTE — Progress Notes (Signed)
Went to speak with pt but she is speaking with "[facility]" Pt with CM consult stating she is being followed by [facility] and requesting a elevated toilet seat  1423 Cm able to get pt when she got off the phone to describe to her what an elevated toilet seat and 3 n1 are Cm had spoken with Jermaine of [facility] who is able to provide a 3n1 but not an elevated toilet seat Pt agrees to a 3n1 and order entered in EPIC for processing by [facility] staff to bring it to pt

## 2015-10-01 ENCOUNTER — Encounter: Payer: Self-pay | Admitting: *Deleted

## 2015-10-01 ENCOUNTER — Telehealth: Payer: Self-pay | Admitting: *Deleted

## 2015-10-01 NOTE — Progress Notes (Signed)
Per Dr. Benay Spice, pt notified to take additional Decadron 8 mg PO tonight.  Pt verbalizes an understanding of instructions.  Teach back done.

## 2015-10-01 NOTE — Telephone Encounter (Signed)
Call placed to check on patient d/t her ED visit on 09/30/15.  Patient tearful and states that her pain to her back and legs is a 9/10.  She states that she is taking Dilaudid 4 mg every 4 hrs around the clock and is also taking Decadron 4 mg daily.  Pt states that she is unable to bear weight on her right leg d/t numbness and no feeling to her right leg.  Spoke with Dr. Benay Spice about patient condition and he would like for patient to be seen today.  Pt has no transportation to [facility] and states that she is unable to get in to a cab or the [employer] bus at this time d/t difficulty ambulating. Dr. Benay Spice would like for patient to come to [facility] via ambulance.  Spoke with CSW, Vernie Shanks to assist with transportation.  Abigail spoke with patient and patient does not want to come to [facility] today.  She stated that she would like to rest at this time per [facility].  Will call patient in the AM to check on her status at that time.

## 2015-10-02 ENCOUNTER — Other Ambulatory Visit: Payer: Self-pay | Admitting: Nurse Practitioner

## 2015-10-02 ENCOUNTER — Ambulatory Visit: Payer: Medicare Other | Admitting: Radiation Oncology

## 2015-10-02 ENCOUNTER — Ambulatory Visit
Admission: RE | Admit: 2015-10-02 | Discharge: 2015-10-02 | Disposition: A | Discharge status: Home / Self Care | Payer: Medicare Other | Source: Ambulatory Visit | Attending: Radiation Oncology | Admitting: Radiation Oncology

## 2015-10-02 ENCOUNTER — Ambulatory Visit (HOSPITAL_BASED_OUTPATIENT_CLINIC_OR_DEPARTMENT_OTHER): Payer: Medicare Other | Admitting: Oncology

## 2015-10-02 ENCOUNTER — Encounter: Payer: Self-pay | Admitting: *Deleted

## 2015-10-02 ENCOUNTER — Encounter (HOSPITAL_COMMUNITY): Payer: Self-pay

## 2015-10-02 ENCOUNTER — Telehealth: Payer: Self-pay | Admitting: *Deleted

## 2015-10-02 ENCOUNTER — Inpatient Hospital Stay (HOSPITAL_COMMUNITY)
Admission: AD | Admit: 2015-10-02 | Discharge: 2015-10-05 | DRG: 948 | Disposition: A | Discharge status: Home Health | Payer: Medicare Other | Source: Ambulatory Visit | Attending: Oncology | Admitting: Oncology

## 2015-10-02 ENCOUNTER — Other Ambulatory Visit: Payer: Self-pay | Admitting: *Deleted

## 2015-10-02 ENCOUNTER — Telehealth: Payer: Self-pay | Admitting: Oncology

## 2015-10-02 VITALS — BP 152/100 | HR 95 | Temp 98.3°F | Resp 18

## 2015-10-02 DIAGNOSIS — R918 Other nonspecific abnormal finding of lung field: Secondary | ICD-10-CM | POA: Diagnosis present

## 2015-10-02 DIAGNOSIS — G620 Drug-induced polyneuropathy: Secondary | ICD-10-CM | POA: Diagnosis present

## 2015-10-02 DIAGNOSIS — C7951 Secondary malignant neoplasm of bone: Secondary | ICD-10-CM | POA: Diagnosis present

## 2015-10-02 DIAGNOSIS — G479 Sleep disorder, unspecified: Secondary | ICD-10-CM | POA: Diagnosis present

## 2015-10-02 DIAGNOSIS — Z9049 Acquired absence of other specified parts of digestive tract: Secondary | ICD-10-CM

## 2015-10-02 DIAGNOSIS — R531 Weakness: Secondary | ICD-10-CM | POA: Diagnosis present

## 2015-10-02 DIAGNOSIS — F419 Anxiety disorder, unspecified: Secondary | ICD-10-CM | POA: Diagnosis present

## 2015-10-02 DIAGNOSIS — M79605 Pain in left leg: Secondary | ICD-10-CM

## 2015-10-02 DIAGNOSIS — Z803 Family history of malignant neoplasm of breast: Secondary | ICD-10-CM | POA: Diagnosis not present

## 2015-10-02 DIAGNOSIS — C787 Secondary malignant neoplasm of liver and intrahepatic bile duct: Secondary | ICD-10-CM | POA: Diagnosis not present

## 2015-10-02 DIAGNOSIS — M8448XA Pathological fracture, other site, initial encounter for fracture: Secondary | ICD-10-CM | POA: Diagnosis present

## 2015-10-02 DIAGNOSIS — C189 Malignant neoplasm of colon, unspecified: Secondary | ICD-10-CM | POA: Diagnosis present

## 2015-10-02 DIAGNOSIS — Z8249 Family history of ischemic heart disease and other diseases of the circulatory system: Secondary | ICD-10-CM | POA: Diagnosis not present

## 2015-10-02 DIAGNOSIS — Z510 Encounter for antineoplastic radiation therapy: Secondary | ICD-10-CM | POA: Insufficient documentation

## 2015-10-02 DIAGNOSIS — D638 Anemia in other chronic diseases classified elsewhere: Secondary | ICD-10-CM | POA: Diagnosis present

## 2015-10-02 DIAGNOSIS — C7801 Secondary malignant neoplasm of right lung: Secondary | ICD-10-CM | POA: Diagnosis present

## 2015-10-02 DIAGNOSIS — D509 Iron deficiency anemia, unspecified: Secondary | ICD-10-CM | POA: Diagnosis present

## 2015-10-02 DIAGNOSIS — C801 Malignant (primary) neoplasm, unspecified: Secondary | ICD-10-CM | POA: Insufficient documentation

## 2015-10-02 DIAGNOSIS — D6481 Anemia due to antineoplastic chemotherapy: Secondary | ICD-10-CM

## 2015-10-02 DIAGNOSIS — I10 Essential (primary) hypertension: Secondary | ICD-10-CM | POA: Diagnosis present

## 2015-10-02 DIAGNOSIS — Z923 Personal history of irradiation: Secondary | ICD-10-CM | POA: Diagnosis not present

## 2015-10-02 DIAGNOSIS — T451X5A Adverse effect of antineoplastic and immunosuppressive drugs, initial encounter: Secondary | ICD-10-CM | POA: Diagnosis present

## 2015-10-02 DIAGNOSIS — C7802 Secondary malignant neoplasm of left lung: Secondary | ICD-10-CM | POA: Diagnosis present

## 2015-10-02 DIAGNOSIS — K760 Fatty (change of) liver, not elsewhere classified: Secondary | ICD-10-CM | POA: Diagnosis present

## 2015-10-02 DIAGNOSIS — C187 Malignant neoplasm of sigmoid colon: Secondary | ICD-10-CM | POA: Diagnosis not present

## 2015-10-02 DIAGNOSIS — G893 Neoplasm related pain (acute) (chronic): Principal | ICD-10-CM | POA: Diagnosis present

## 2015-10-02 LAB — CBC
HEMATOCRIT: 37.1 % (ref 36.0–46.0)
HEMOGLOBIN: 12.3 g/dL (ref 12.0–15.0)
MCH: 28.4 pg (ref 26.0–34.0)
MCHC: 33.2 g/dL (ref 30.0–36.0)
MCV: 85.7 fL (ref 78.0–100.0)
Platelets: 185 10*3/uL (ref 150–400)
RBC: 4.33 MIL/uL (ref 3.87–5.11)
RDW: 17 % — ABNORMAL HIGH (ref 11.5–15.5)
WBC: 12.2 10*3/uL — ABNORMAL HIGH (ref 4.0–10.5)

## 2015-10-02 LAB — CREATININE, SERUM
Creatinine, Ser: 0.66 mg/dL (ref 0.44–1.00)
GFR calc Af Amer: >60 mL/min (ref >60)
GFR calc non Af Amer: >60 mL/min (ref >60)

## 2015-10-02 MED ORDER — ENOXAPARIN SODIUM 40 MG/0.4ML ~~LOC~~ SOLN
40.0000 mg | SUBCUTANEOUS | Status: DC
  Filled 2015-10-02: qty 0.4

## 2015-10-02 MED ORDER — ONDANSETRON HCL 4 MG/2ML IJ SOLN: 4.0000 mg | Freq: Four times a day (QID) | INTRAMUSCULAR | Status: DC | PRN

## 2015-10-02 MED ORDER — ENOXAPARIN SODIUM 40 MG/0.4ML ~~LOC~~ SOLN
40.0000 mg | SUBCUTANEOUS | Status: DC
Start: 2015-10-02 — End: 2015-10-05
  Administered 2015-10-02 – 2015-10-04 (×3): 40 mg via SUBCUTANEOUS
  Filled 2015-10-02 (×3): qty 0.4

## 2015-10-02 MED ORDER — HYDROMORPHONE HCL 4 MG/ML IJ SOLN
INTRAMUSCULAR | Status: AC
  Filled 2015-10-02: qty 1

## 2015-10-02 MED ORDER — ACETAMINOPHEN 325 MG PO TABS: 650.0000 mg | ORAL_TABLET | Freq: Four times a day (QID) | ORAL | Status: DC | PRN

## 2015-10-02 MED ORDER — ONDANSETRON HCL 4 MG PO TABS
4.0000 mg | ORAL_TABLET | Freq: Four times a day (QID) | ORAL | Status: DC | PRN
Start: 2015-10-02 — End: 2015-10-05

## 2015-10-02 MED ORDER — ACETAMINOPHEN 650 MG RE SUPP
650.0000 mg | Freq: Four times a day (QID) | RECTAL | Status: DC | PRN
Start: 2015-10-02 — End: 2015-10-05

## 2015-10-02 MED ORDER — AMLODIPINE BESYLATE 10 MG PO TABS
10.0000 mg | ORAL_TABLET | Freq: Every day | ORAL | Status: DC
  Administered 2015-10-03 – 2015-10-05 (×3): 10 mg via ORAL
  Filled 2015-10-02 (×3): qty 1

## 2015-10-02 MED ORDER — HYDROMORPHONE HCL 4 MG/ML IJ SOLN
2.0000 mg | Freq: Once | INTRAMUSCULAR | Status: AC
  Administered 2015-10-02: 2 mg via INTRAVENOUS

## 2015-10-02 MED ORDER — MORPHINE SULFATE 2 MG/ML IJ SOLN
2.0000 mg | INTRAMUSCULAR | Status: DC | PRN
  Filled 2015-10-02: qty 2

## 2015-10-02 MED ORDER — AMLODIPINE BESYLATE 10 MG PO TABS
10.0000 mg | ORAL_TABLET | Freq: Every day | ORAL | Status: DC
  Administered 2015-10-02: 10 mg via ORAL
  Filled 2015-10-02 (×2): qty 1

## 2015-10-02 MED ORDER — SENNA 8.6 MG PO TABS
1.0000 | ORAL_TABLET | Freq: Two times a day (BID) | ORAL | Status: DC
Start: 2015-10-02 — End: 2015-10-02
  Filled 2015-10-02: qty 1

## 2015-10-02 MED ORDER — HYDROMORPHONE HCL 2 MG/ML IJ SOLN
2.0000 mg | INTRAMUSCULAR | Status: DC | PRN
  Administered 2015-10-03 – 2015-10-05 (×7): 2 mg via INTRAVENOUS
  Filled 2015-10-02 (×7): qty 1

## 2015-10-02 MED ORDER — DEXAMETHASONE SODIUM PHOSPHATE 10 MG/ML IJ SOLN
10.0000 mg | Freq: Two times a day (BID) | INTRAMUSCULAR | Status: DC
  Administered 2015-10-02 – 2015-10-04 (×4): 10 mg via INTRAVENOUS
  Filled 2015-10-02 (×4): qty 1

## 2015-10-02 MED ORDER — MORPHINE SULFATE 25 MG/ML IV SOLN
2.0000 mg/h | INTRAVENOUS | Status: DC
  Administered 2015-10-02: 2 mg/h via INTRAVENOUS
  Filled 2015-10-02: qty 10

## 2015-10-02 MED ORDER — SENNA 8.6 MG PO TABS
1.0000 | ORAL_TABLET | Freq: Two times a day (BID) | ORAL | Status: DC
  Administered 2015-10-02 – 2015-10-05 (×6): 8.6 mg via ORAL
  Filled 2015-10-02 (×6): qty 1

## 2015-10-02 MED ORDER — HYDROMORPHONE HCL 4 MG PO TABS
8.0000 mg | ORAL_TABLET | ORAL | Status: DC | PRN
  Administered 2015-10-02 – 2015-10-05 (×9): 8 mg via ORAL
  Filled 2015-10-02 (×11): qty 2

## 2015-10-02 MED ORDER — DEXAMETHASONE SODIUM PHOSPHATE 10 MG/ML IJ SOLN
10.0000 mg | Freq: Two times a day (BID) | INTRAMUSCULAR | Status: DC
  Administered 2015-10-02: 10 mg via INTRAVENOUS
  Filled 2015-10-02 (×2): qty 1

## 2015-10-02 MED ORDER — MORPHINE SULFATE 25 MG/ML IV SOLN
2.0000 mg/h | INTRAVENOUS | Status: DC
  Filled 2015-10-02: qty 10

## 2015-10-02 MED ORDER — POLYETHYLENE GLYCOL 3350 17 G PO PACK: 17.0000 g | PACK | Freq: Every day | ORAL | Status: DC | PRN

## 2015-10-02 MED ORDER — SODIUM CHLORIDE 0.9 % IV SOLN
INTRAVENOUS | Status: DC
  Administered 2015-10-02: 14:00:00 via INTRAVENOUS

## 2015-10-02 NOTE — Consult Note (Signed)
Radiation Oncology         [phone] ________________________________  INPATIENT  Name: [name] MRN: [id number]  Date: 10/02/2015  DOB: [birth year]   REFERRING PHYSICIAN: Julieanne Manson, M.D.  DIAGNOSIS: The primary encounter diagnosis was Metastatic colon cancer to liver [facility]). Diagnoses of Bone metastasis ([place]) and Essential hypertension were also pertinent to this visit.   HISTORY OF PRESENT ILLNESS::[name] is a 60 y.o. female who I have been asked to see after her recent admission for severe pain involving the left leg. The patient describes significant pain in the lower left leg associated with left lower extremity weakness as well. She has a known history of metastatic colon cancer. Recent MRI scan of the spine and sacrum reveals significant metastatic disease within the lumbosacral spine with likely nerve involvement of the sacral roots. The patient has been started on Decadron and more intensive pain management as an inpatient and she feels much better today.    PAST MEDICAL HISTORY:  has a past medical history of Hypertension; Blood in stool; History of blood transfusion; Anemia; Eczema; Heart murmur; Anxiety; Neuromuscular disorder ([place]); radiation therapy (10/06/13,16th,19th,22nd,&10/18/13); Cancer [facility]); and Metastatic colon cancer to liver [facility]).     PAST SURGICAL HISTORY: Past Surgical History  Procedure Laterality Date  . Breast lumpectomy      left breast  . Colonoscopy  02/18/2012    Procedure: COLONOSCOPY;  Surgeon: Beryle Beams, MD;  Location: [place];  Service: Endoscopy;  Laterality: N/A;  . Portacath placement  03/14/2012    Procedure: INSERTION PORT-A-CATH;  Surgeon: Ralene Ok, MD;  Location: [place];  Service: General;  Laterality: N/A;  . Laparoscopic sigmoid colectomy  04/11/2012    Procedure: LAPAROSCOPIC SIGMOID COLECTOMY;  Surgeon: Stark Klein, MD;  Location: [place];  Service: General;;  converted to Open with splenic flexure take down    . Open partial hepatectomy [83]  04/11/2012    Procedure: OPEN PARTIAL HEPATECTOMY [83];  Surgeon: Stark Klein, MD;  Location: [place];  Service: General;  Laterality: Right;  . Laparoscopy  04/11/2012    Procedure: LAPAROSCOPY DIAGNOSTIC;  Surgeon: Stark Klein, MD;  Location: [place];  Service: General;  Laterality: N/A;  . Cholecystectomy  04/11/2012    Procedure: CHOLECYSTECTOMY;  Surgeon: Stark Klein, MD;  Location: [place];  Service: General;;  . Ileocecetomy  04/11/2012    Procedure: Pennie Rushing;  Surgeon: Stark Klein, MD;  Location: [place];  Service: General;  Laterality: N/A;  . Port a cath revision  05/23/2012    Procedure: PORT A CATH REVISION;  Surgeon: Ralene Ok, MD;  Location: [place];  Service: General;  Laterality: Right;  Port-A-Cath repositioning versus replacement  . Abdominal hysterectomy      early 35's  . Femur im nail Right 06/15/2015    Procedure: INTRAMEDULLARY (IM) NAIL INTERTROCHANTERIC ;  Surgeon: Mcarthur Rossetti, MD;  Location: [facility];  Service: Orthopedics;  Laterality: Right;     FAMILY HISTORY: family history includes Cancer (age of onset: 40) in her mother; Heart disease in her father; Hypertension in her mother.   SOCIAL HISTORY:  reports that she has never smoked. She has never used smokeless tobacco. She reports that she does not drink alcohol or use illicit drugs.   ALLERGIES: Review of patient's allergies indicates no known allergies.   MEDICATIONS:  Current Facility-Administered Medications  Medication Dose Route Frequency Provider Last Rate Last Dose  . 0.9 %  sodium chloride infusion   Intravenous Continuous Ladell Pier,  MD 10 mL/hr at 10/02/15 1405    . acetaminophen (TYLENOL) tablet 650 mg  650 mg Oral Q6H PRN Ladell Pier, MD       Or  . acetaminophen (TYLENOL) suppository 650 mg  650 mg Rectal Q6H PRN Ladell Pier, MD      . amLODipine (NORVASC) tablet 10 mg  10 mg Oral Daily Ladell Pier, MD   10 mg at 10/02/15 1833   . dexamethasone (DECADRON) injection 10 mg  10 mg Intravenous Q12H Ladell Pier, MD   10 mg at 10/02/15 1833  . enoxaparin (LOVENOX) injection 40 mg  40 mg Subcutaneous Q24H Ladell Pier, MD      . HYDROmorphone (DILAUDID) injection 2 mg  2 mg Intravenous Q3H PRN Heath Lark, MD      . HYDROmorphone (DILAUDID) tablet 8 mg  8 mg Oral Q3H PRN Heath Lark, MD   8 mg at 10/02/15 1922  . morphine 2 MG/ML injection 2-4 mg  2-4 mg Intravenous Q30 min PRN Ladell Pier, MD      . ondansetron [facility]) tablet 4 mg  4 mg Oral Q6H PRN Ladell Pier, MD       Or  . ondansetron [facility]) injection 4 mg  4 mg Intravenous Q6H PRN Ladell Pier, MD      . polyethylene glycol (MIRALAX / GLYCOLAX) packet 17 g  17 g Oral Daily PRN Ladell Pier, MD      . senna [facility]) tablet 8.6 mg  1 tablet Oral BID Ladell Pier, MD           PHYSICAL EXAM:  height is '5\' 11"'$  (1.803 m) and weight is 247 lb (112.038 kg). Her oral temperature is 98.4 F (36.9 C). Her blood pressure is 160/100 and her pulse is 100. Her respiration is 18 and oxygen saturation is 96%.   ECOG = 1-2  0 - Asymptomatic (Fully active, able to carry on all predisease activities without restriction)  1 - Symptomatic but completely ambulatory (Restricted in physically strenuous activity but ambulatory and able to carry out work of a light or sedentary nature. For example, light housework, office work)  2 - Symptomatic, <50% in bed during the day (Ambulatory and capable of all self care but unable to carry out any work activities. Up and about more than 50% of waking hours)  3 - Symptomatic, >50% in bed, but not bedbound (Capable of only limited self-care, confined to bed or chair 50% or more of waking hours)  4 - Bedbound (Completely disabled. Cannot carry on any self-care. Totally confined to bed or chair)  5 - Death   Eustace Pen MM, Creech RH, Tormey DC, et al. [phone]). "Toxicity and response criteria of the [facility]  Group". [place] Oncol. 5 (6): 649-55  Sensation intact in the lower extremities bilaterally. The patient has 4+ out of 5 plantar flexion on the left and 3 out of 5 dorsiflexion on the left. 5 out of 5 on the right.   LABORATORY DATA:  Lab Results  Component Value Date   WBC 12.2* 10/02/2015   HGB 12.3 10/02/2015   HCT 37.1 10/02/2015   MCV 85.7 10/02/2015   PLT 185 10/02/2015   Lab Results  Component Value Date   NA 138 09/30/2015   K 4.2 09/30/2015   CL 104 09/30/2015   CO2 25 09/30/2015   Lab Results  Component Value Date   ALT 70* 09/20/2015  AST 33 09/20/2015   ALKPHOS 100 09/20/2015   BILITOT 0.46 09/20/2015      RADIOGRAPHY: Dg Tibia/fibula Left  09/20/2015  CLINICAL DATA:  History of metastatic colon cancer. Left lower leg pain without injury. EXAM: LEFT TIBIA AND FIBULA - 2 VIEW COMPARISON:  None. FINDINGS: There is no evidence of fracture or other focal bone lesions. Soft tissues are unremarkable. IMPRESSION: Normal left tibia and fibula. Electronically Signed   By: Marijo Conception, M.D.   On: 09/20/2015 15:18   Mr Lumbar Spine Wo Contrast  09/30/2015  CLINICAL DATA:  Left lower extremity pain and numbness, 2 weeks duration. Patient with history of metastatic cancer. Colon carcinoma. Being treated with chemotherapy. EXAM: MRI LUMBAR SPINE WITHOUT CONTRAST TECHNIQUE: Multiplanar, multisequence MR imaging of the lumbar spine was performed. No intravenous contrast was administered. COMPARISON:  Radiography 08/03/2015.  CT abdomen 08/04/2015. FINDINGS: Segmentation:  S1 is transitional. Alignment:  Normal Vertebrae: There is a metastatic lesion within the central portion of the L3 vertebra measuring 2.7 cm in diameter. Minimal superior endplate depression probably secondary to this. No extraosseous tumor or any encroachment upon the nerves or neural spaces. There is tumor involvement within the right pedicle and transverse process at L5. More extensive tumor involvement is  seen affecting the transitional S1 segment, particularly along the left side with extension into the left pedicle and posterior elements. There may be minimal ventral epidural tumor but this does not appear to affect the neural structures. Tumor is also present extensively within the S2 segment on the left. Left sacral ala as extensively involved with tumor. I do not demonstrate any compressive effect upon the sacral nerve roots, but there is mild encroachment upon the left sacral foramina that could possibly be symptomatic. Conus medullaris: Extends to the L2-3 level and appears normal. Paraspinal and other soft tissues: Negative Disc levels: T12-L1 and L1-2 are normal. L2-3: Minimal bulging of the disc. No stenosis or neural compression. Minimal facet hypertrophy. L3-4:  No disc pathology.  Minimal facet hypertrophy.  No stenosis. L4-5: Facet degeneration with 1 mm of anterolisthesis. Minimal bulging of the disc. No stenosis. L5-S1: Minimal bulging of the disc. Mild facet hypertrophy. No stenosis. IMPRESSION: No spinal stenosis or degenerative disc disease of significance. Lower lumbar facet osteoarthritis, mild to moderate in degree, which could be associated with low back pain or facet syndrome pain. Osseous metastatic disease demonstrated affecting the L3 vertebral body, L5 posterior elements on the right, and extensively involving the S1 and S2 levels on the left. There is minimal ventral epidural tumor at S1. There is mild tumor encroachment upon the intervertebral foramina in the sacral region on the left. Definite neural compression is not demonstrated, but the left-sided sacral nerve roots could be affected or irritated. Electronically Signed   By: Nelson Chimes M.D.   On: 09/30/2015 13:30       IMPRESSION:  The patient has metastatic colon cancer with bony metastasis, now with evidence of progression within the lumbo-sacral spine. Clear significant nerve root involvement was not seen radiographically  but possible impingement was seen and based on her clinical symptoms this appears to be the most likely cause of her current admitting complaint. I have therefore been asked to see the patient today for consideration of palliative radiation treatment.  I believe she is a good candidate for this and I would like to begin her treatment as soon as possible. I have discussed proceeding with a simulation tomorrow and begin her first treatment  tomorrow afternoon. The patient is in agreement with this plan and would like to begin as soon as possible.    PLAN: The patient will undergo simulation and her first treatment tomorrow. I anticipate a potential 2 week course of treatment. This can begin with the patient as an inpatient and this can be continued with discharge.    ________________________________   Jodelle Gross, MD, PhD   **Disclaimer: This note was dictated with voice recognition software. Similar sounding words can inadvertently be transcribed and this note may contain transcription errors which may not have been corrected upon publication of note.**

## 2015-10-02 NOTE — Progress Notes (Signed)
See admission history and physical 10/02/2015

## 2015-10-02 NOTE — Progress Notes (Signed)
Wasted 250cc of morphine sulfate drip in sink.  Witnessed by Marta Lamas, RN

## 2015-10-02 NOTE — Progress Notes (Addendum)
1122-Pt states pain to back and legs 8/10.  Dilaudid 2 mg IV given per MD order.   1155-Pain level remains an 8/10.  Additional 2 mg Dilaudid IV given per MD order. 1230-Pt states that pain is easing but remains an 8/10.  Dr. Benay Spice aware.  1245-Report called to [employer] on [place].  1249-BP rechecked 174/116, manual check-152/100.  Pt states that she did not take Norvasc today.  L. Thomas NP notified and order received for patient to take Norvasc today once admitted to floor.   1300-Pt transferred to 1342 via w/c with assist of RN and NT.  Pt settled in room and order for Norvasc relayed to R. Bobbye Charleston RN.

## 2015-10-02 NOTE — Progress Notes (Signed)
[facility] Work  Clinical Social Work was referred by nurse to assist with transportation to [employer]. Pt not willing to go to ED, but needed to be evaluated by MD. CSW arranged for [facility] taxi to get pt and be seen at [facility]. CSW stayed with pt for the duration of her appointment for support. Pt in extreme pain today. Pt continues to live in her apartment with her daughter and 61 yo grand daughter. She has a town house and her bedroom and the only bathroom are on the second floor. Pt recently began receiving [place] services through [facility] and has CNA, PT and OT coming. She has walker, shower chair and raised toilet seat.   CSW explored with pt that a one level apartment may be necessary. Pt is open to exploring that possibility and will get her daughter to inquire at their apartment complex if there are any units with a downstairs bedroom and bath. CSW explored possible SNF placement for ST rehab and pt felt strongly that she was going to return home after hospitalization. Pt had been processing the advanced stage of her cancer in the recent past, but appears to not be able to discuss currently. [place] CSW to follow and will meet with pt at [facility] on 10/03/15 to reassess needs.   Clinical Social Work interventions: Transportation assistance [place], [place] Social Worker [place]  [place] Phone: [phone] Fax: [phone]

## 2015-10-02 NOTE — Telephone Encounter (Signed)
Called Dr. Gearldine Shown RN Lorriane Shire, Veda Epps, has put in request  For patient to be seen today  As inpatient  again  She is in [place] long, Lorriane Shire informed Dr. Lisbeth Renshaw is in Breast clinic today all day but he has an inbasket  Of this, 2:47 PM

## 2015-10-02 NOTE — H&P (Signed)
Patient History and Physical   [name] [id number] 02-Mar-1956 60 y.o. 10/02/2015    Patient Identification: 60 year old with metastatic colon cancer admitted for evaluation and management of pain  HPI:  [name] has a history of metastatic colon cancer. She is currently being treated with regorafenib. She completed SRS treatment to the thoracic spine 08/26/2015 for management of severe pain related to a pathologic compression fracture. This pain improved.  [name] now complains of severe pain involving the left leg. There is numbness and weakness of the left leg. She was seen in the emergency room on 09/30/2015 and discharged. An MRI of the spine revealed metastatic disease involving the lumbar spine and sacrum. Extensive involvement of S1 and S2 on the left side was noted.  The pain has not been relieved with Dilaudid. She took an increased dose of Decadron last night with some improvement in the pain. The pain remains severe. She rates the pain at 10/10 this morning. She was unable to be transported to the [place] yesterday. She arrived by taxi today.  She has no complaint other than the pain. No difficulty with bowel or bladder control.  PMH:  Past Medical History  Diagnosis Date  . Hypertension       . Right subclavian and axillary thromboses  07/01/2012   . History of blood transfusion   . History of Anemia   . Eczema   . Heart murmur     does not see a cardiologist has been told she does has a murmur and also that she does not have a murmur  . Anxiety   . Neuromuscular disorder (HCC)     numbness feet  . Hx of radiation therapy 10/06/13,16th,19th,22nd,&10/18/13    SBRT lung  . Cancer ([place])   . Metastatic colon cancer to liver And bones [facility])     Past Surgical History  Procedure Laterality Date  . Breast lumpectomy      left breast  . Colonoscopy  02/18/2012    Procedure: COLONOSCOPY;  Surgeon: Beryle Beams, MD;  Location: [place];  Service:  Endoscopy;  Laterality: N/A;  . Portacath placement  03/14/2012    Procedure: INSERTION PORT-A-CATH;  Surgeon: Ralene Ok, MD;  Location: [place];  Service: General;  Laterality: N/A;  . Laparoscopic sigmoid colectomy  04/11/2012    Procedure: LAPAROSCOPIC SIGMOID COLECTOMY;  Surgeon: Stark Klein, MD;  Location: [place];  Service: General;;  converted to Open with splenic flexure take down  . Open partial hepatectomy [83]  04/11/2012    Procedure: OPEN PARTIAL HEPATECTOMY [83];  Surgeon: Stark Klein, MD;  Location: [place];  Service: General;  Laterality: Right;  . Laparoscopy  04/11/2012    Procedure: LAPAROSCOPY DIAGNOSTIC;  Surgeon: Stark Klein, MD;  Location: [place];  Service: General;  Laterality: N/A;  . Cholecystectomy  04/11/2012    Procedure: CHOLECYSTECTOMY;  Surgeon: Stark Klein, MD;  Location: [place];  Service: General;;  . Ileocecetomy  04/11/2012    Procedure: Pennie Rushing;  Surgeon: Stark Klein, MD;  Location: [place];  Service: General;  Laterality: N/A;  . Port a cath revision  05/23/2012    Procedure: PORT A CATH REVISION;  Surgeon: Ralene Ok, MD;  Location: [place];  Service: General;  Laterality: Right;  Port-A-Cath repositioning versus replacement  . Abdominal hysterectomy      early 6's  . Femur im nail Right 06/15/2015    Procedure: INTRAMEDULLARY (IM) NAIL INTERTROCHANTERIC ;  Surgeon: Mcarthur Rossetti, MD;  Location:  [facility];  Service: Orthopedics;  Laterality: Right;    Allergies:  No Known Allergies  Medications:  See electronically medical record  Social History: She lives in an apartment with her daughter. She does not use tobacco or alcohol.   Family History:  Family History  Problem Relation Age of Onset  . Hypertension Mother   . Cancer Mother 73    breast ca  . Heart disease Father     Review of Systems:  Positives include:Pain, numbness, and weakness of the left leg  A complete ROS was otherwise negative.   Physical  Exam:  There were no vitals taken for this visit.  HEENT: No thrush or ulcers Lungs: Clear bilaterally Cardiac: Regular rate and rhythm Abdomen: No hepatomegaly, nontender  Vascular: No leg edema Neurologic: 4/5 strength with flexion at the left hip and extension at the left knee. 3+/5 strength with dorsi flexion at the left foot. 5/5 strength in the arms and right leg/foot Skin: No rash Musculoskeletal: No spine tenderness  Lab Results:  Lab Results  Component Value Date   WBC 12.6* 09/30/2015   HGB 12.5 09/30/2015   HCT 37.9 09/30/2015   MCV 85.6 09/30/2015   PLT 198 09/30/2015   NEUTROABS 11.0* 09/30/2015     Radiological Studies:  Mr Lumbar Spine Wo Contrast  09/30/2015  CLINICAL DATA:  Left lower extremity pain and numbness, 2 weeks duration. Patient with history of metastatic cancer. Colon carcinoma. Being treated with chemotherapy. EXAM: MRI LUMBAR SPINE WITHOUT CONTRAST TECHNIQUE: Multiplanar, multisequence MR imaging of the lumbar spine was performed. No intravenous contrast was administered. COMPARISON:  Radiography 08/03/2015.  CT abdomen 08/04/2015. FINDINGS: Segmentation:  S1 is transitional. Alignment:  Normal Vertebrae: There is a metastatic lesion within the central portion of the L3 vertebra measuring 2.7 cm in diameter. Minimal superior endplate depression probably secondary to this. No extraosseous tumor or any encroachment upon the nerves or neural spaces. There is tumor involvement within the right pedicle and transverse process at L5. More extensive tumor involvement is seen affecting the transitional S1 segment, particularly along the left side with extension into the left pedicle and posterior elements. There may be minimal ventral epidural tumor but this does not appear to affect the neural structures. Tumor is also present extensively within the S2 segment on the left. Left sacral ala as extensively involved with tumor. I do not demonstrate any compressive effect  upon the sacral nerve roots, but there is mild encroachment upon the left sacral foramina that could possibly be symptomatic. Conus medullaris: Extends to the L2-3 level and appears normal. Paraspinal and other soft tissues: Negative Disc levels: T12-L1 and L1-2 are normal. L2-3: Minimal bulging of the disc. No stenosis or neural compression. Minimal facet hypertrophy. L3-4:  No disc pathology.  Minimal facet hypertrophy.  No stenosis. L4-5: Facet degeneration with 1 mm of anterolisthesis. Minimal bulging of the disc. No stenosis. L5-S1: Minimal bulging of the disc. Mild facet hypertrophy. No stenosis. IMPRESSION: No spinal stenosis or degenerative disc disease of significance. Lower lumbar facet osteoarthritis, mild to moderate in degree, which could be associated with low back pain or facet syndrome pain. Osseous metastatic disease demonstrated affecting the L3 vertebral body, L5 posterior elements on the right, and extensively involving the S1 and S2 levels on the left. There is minimal ventral epidural tumor at S1. There is mild tumor encroachment upon the intervertebral foramina in the sacral region on the left. Definite neural compression is not demonstrated, but the left-sided sacral  nerve roots could be affected or irritated. Electronically Signed   By: Nelson Chimes M.D.   On: 09/30/2015 13:30     Impression and Plan: 1. Metastatic colon cancer (T4, N0, M1) adenocarcinoma of the sigmoid colon status post sigmoid colectomy and partial right hepatectomy 04/11/2012. Microsatellite stable. No loss of mismatch repair proteins. K-ras wild-type on the initial codon 12 and 13 testing. K-ras Q61L and APC mutations identified on extended testing. BRAF not mutated. No HER-2 amplification on Foundation 1 testing.  She completed cycle 1 CAPOX beginning 05/25/2012.   She completed cycle 8 CAPOX beginning 10/19/2012.   CEA on 10/19/2012 normal at 0.8.   CEA 6.1 on 01/11/2013   Restaging CT on 01/11/2013  consistent with multiple new pulmonary metastases   Cycle 1 of FOLFIRI/Avastin 01/18/2013; cycle 2 02/01/2013,cycle 3 02/16/2013.   CEA 5.0 on 03/01/2013.   Cycle 4 FOLFIRI/Avastin 03/01/2013.   Cycle 5 FOLFIRI/Avastin 03/15/2013.   Restaging chest CT 03/29/2013 with possible slight decrease in size of a left upper lobe metastasis. Other pulmonary nodules stable.   Cycle 6 FOLFIRI/Avastin 03/29/2013   Cycle 7 of FOLFIRI/Avastin 04/12/2013.   CEA 6.3 05/03/2013.   Cycle 8 FOLFIRI/Avastin 05/03/2013.   Cycle 9 FOLFIRI/Avastin 05/17/2013.   Cycle 10 FOLFIRI/Avastin 05/31/2013.   CEA 10.6 on 06/19/2013.   CT chest 06/19/2013 with mild progression of bilateral pulmonary nodules/metastases.   Decision made to continue FOLFIRI/Avastin. She completed cycle 11 06/21/2013.   Cycle 12 FOLFIRI/Avastin 07/05/2013   Cycle 13 FOLFIRI/Avastin 07/19/2013   Cycle 14 FOLFIRI/Avastin 08/01/2013.   CT chest 08/28/2013 with increased in size of bilateral lung nodules.   PET scan 09/22/2013 with multiple hypermetabolic pulmonary nodules bilaterally. Nodules demonstrate progressive enlargement compared with prior CTs. No evidence of extrathoracic metastatic disease.   Status post SRS 2 lung nodules, completed 10/18/2013  CT 01/12/2014 with airspace disease surrounding treated pulmonary nodules, no new nodules  01/12/2014 CEA 0.9  CT chest 04/12/2014 with improvement in airspace disease surrounding treated pulmonary nodules, no new nodules  04/12/2014 CEA 8.2  07/03/2014 CEA 60.3  CT 07/03/2014 with masslike enlargement of 2 treated lung lesions  Cycle 1 Lonsurf 07/12/2014.  Cycle 2 Lonsurf 08/11/2014 or 08/12/2014  Cycle 3 Lonsurf 09/08/2014  Cycle 4 Lonsurf 10/06/2014  Restaging CT of the chest on 10/01/2014 with stable lung nodules  Cycle 5 Lonsurf 11/05/2014  Cycle 6 Lonsurf 12/03/2014  Restaging CTs of the chest, abdomen, and pelvis on 12/26/2014  with no evidence of disease progression, stable lung nodules  Cycle 7 Lonsurf 12/31/2014  Cycle 8 Lonsurf 01/28/2015  Elevated CEA 03/12/2015  CT 03/19/2015 with enlargement of bilateral lung masses and progressive bone metastases including an enlarging lesion at T6 with replacement of the vertebral body  Cycle 1 FOLFOX/Avastin 03/25/2015  Cycle 2 FOLFOX/Avastin 04/08/2015  Cycle 3 FOLFOX/Avastin 04/23/2015  Cycle 4 FOLFOX/Avastin 05/08/2015  Cycle 5 FOLFOX/Avastin 05/22/2015  Restaging CT scans 06/10/2015 with decreased size of bilateral pulmonary metastases; stable hepatic steatosis and postoperative changes from partial hepatectomy; no definite liver metastases; mild increase in size of lytic bone metastasis in the right ilium; stable T6 vertebral body lytic metastasis  CT right femur 06/12/2015 with lesion in the right femur, status post placement of an intramedullary nail 06/15/2015  Cycle 1 Regorafenib beginning 08/02/2015 (prematurely discontinued)  Pathologic T6 compression fracture 08/19/2015 status post [facility] 08/26/2015  Cycle 2 Regorafenib beginning 08/29/2015 (dose reduced to 80 mg daily for 21 days followed by a 7 day break)  Cycle 3 Regorafenib  beginning 09/26/2015 2. Microcytic anemia, iron deficiency. Improved.  3. Status post Port-A-Cath placement 03/14/2012. X-ray showed the Port-A-Cath tip to be in the azygos vein. The left-sided Port-A-Cath was removed and a new right Port-A-Cath was placed on 05/23/2012. 4. 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 Xarelto discontinued April 2017 due to rectal bleeding. 5. Hypertension-persistent on amlodipine. HCTZ added 12/26/2014 6. Delayed nausea following cycle 5 FOLFIRI/Avastin. Emend was added to the premedication regimen 7. Nausea secondary to Lonsurf-improved with Ativan 8. Mild oxaliplatin neuropathy 9. Right groin pain. Question referred pain from  the hip. 10. CT right femur 06/12/2015 with a partially visualized 4.3 x 2.3 cm lucent lesion in the inferior aspect of the right iliac bone superior to the right acetabulum. Associated destruction of the cortex of the bone; second lesion noted in the midportion of the right femur measuring 1.7 x 6.6 cm. Partial erosive changes of the cortex of the bone adjacent to the lesion.   Status post placement of an intramedullary nail right femur 06/15/2015.   She completed palliative radiation to the right femur and right pelvis 07/04/2015 through 07/19/2015. 11. Difficulty sleeping. She will try temazepam. 12. Anemia secondary to chronic disease, chemotherapy and rectal bleeding 13. Severe left leg pain-likely related to tumor involving the lumbosacral spine  Ms. Garlock has advanced metastatic colon cancer. She is currently being treated with salvage systemic therapy consisting of single agent regorafenib. She has developed severe pain in the left leg with associated weakness and numbness. I suspect the pain is related to nerve root involvement by tumor at the lumbosacral spine.  Her pain cannot be managed as an outpatient. She will be admitted for narcotic analgesics and Decadron. We will consult Dr. Lisbeth Renshaw.  I discussed CPR and ACLS issues with Ms. Dragos. She would like to be maintained on a full CODE STATUS.     Betsy Coder, MD  10/02/2015, 12:23 PM

## 2015-10-02 NOTE — Telephone Encounter (Signed)
Late entry:  1000-Call placed to check on patient.  Patient states that her pain remains a 8/10 and would like to be seen today.  Dr. Benay Spice notified and Vernie Shanks CSW to set up cab for patients transportation.

## 2015-10-02 NOTE — Telephone Encounter (Signed)
error 

## 2015-10-02 NOTE — Progress Notes (Signed)
Call placed to check on patients status this AM.  Pt states that her pain is an 8/10 at this time.  She states that she feels as though she is able to get into a cab to come to [facility] to be seen today.  Abigail CSW notified and will set up cab for pt transportation.  Dr. Benay Spice notified.

## 2015-10-02 NOTE — Telephone Encounter (Signed)
Added apt per pof & per desk nurse, pt here

## 2015-10-03 ENCOUNTER — Other Ambulatory Visit: Payer: Self-pay | Admitting: *Deleted

## 2015-10-03 ENCOUNTER — Ambulatory Visit
Admit: 2015-10-03 | Discharge: 2015-10-03 | Disposition: A | Discharge status: Home / Self Care | Payer: Medicare Other | Attending: Radiation Oncology | Admitting: Radiation Oncology

## 2015-10-03 ENCOUNTER — Encounter: Payer: Self-pay | Admitting: *Deleted

## 2015-10-03 DIAGNOSIS — C7951 Secondary malignant neoplasm of bone: Secondary | ICD-10-CM

## 2015-10-03 NOTE — Progress Notes (Signed)
[place] Work  Clinical Social Work was referred by need for CSW follow up for assessment of psychosocial needs.CSW met with patient in the hospital to offer support and assess for needs. Pt reports she is doing much better and still plans to return home after hospital stay. Pt still strongly feels she can manage stairs at home and the daily up and down in order to come to her radiation. Pt feels with a wheelchair she can continue to use SCAT to get to treatment. Pt currently does NOT have wheel chair at home. CSW encouraged pt to consider recommendations from PT once they assess her. Pt stated "you worry about me more than I do". Pt requests SCAT pass to get to radiation and CSW will assist. CSW validated emotions and offered CSW [facility] as layer of support. CSW will get SCAT pass for pt prior to d/c.     Clinical Social Work interventions: Emotional support  resource education   [name], [place] Worker [place]  [place] Phone: [phone] Fax: [phone]

## 2015-10-03 NOTE — Care Management Note (Signed)
Case Management Note  Patient Details  Name: [name] MRN: [id number] Date of Birth: 1956/04/17  Subjective/Objective:      60 yo admitted with Colon Cancer              Action/Plan: From home with daughter.  Has HH services through [facility].  Will need orders to resume HHPT/aide at discharge.  Expected Discharge Date:   (unknown)               Expected Discharge Plan:  [place]  In-House Referral:     Discharge planning Services  CM Consult  Post Acute Care Choice:    Choice offered to:     DME Arranged:    DME Agency:     HH Arranged:  PT, Nurse's Aide [place] Agency:  [place]  Status of Service:  In process, will continue to follow  Medicare Important Message Given:    Date Medicare IM Given:    Medicare IM give by:    Date Additional Medicare IM Given:    Additional Medicare Important Message give by:     If discussed at [place] of Stay Meetings, dates discussed:    Additional CommentsLynnell Catalan, RN 10/03/2015, 1:06 PM  [phone]

## 2015-10-03 NOTE — Progress Notes (Signed)
IP PROGRESS NOTE  Subjective:   [name] reports improvement in the left leg pain. She did not start the morphine drip. She continues to have weakness in the left leg and foot. Dr. Mitzi Hansen and is scheduled to begin palliative radiation today.  Objective: Vital signs in last 24 hours: Blood pressure 151/81, pulse 79, temperature 98 F (36.7 C), temperature source Oral, resp. rate 18, height 5\' 11"  (1.803 m), weight 247 lb (112.038 kg), SpO2 96 %.  Intake/Output from previous day:    Physical Exam: Lungs: Clear bilaterally Cardiac: Regular rate and rhythm Abdomen: Nontender Extremities: No leg edema Neurologic: 3/5 strength with dorsi flexion at the left foot, 4/5 strength with flexion at the left hip and plantar flexion at the left foot  Portacath/PICC-without erythema  Lab Results:  Recent Labs  10/02/15 1710  WBC 12.2*  HGB 12.3  HCT 37.1  PLT 185    BMET  Recent Labs  10/02/15 1710  CREATININE 0.66    Medications: I have reviewed the patient's current medications.  Assessment/Plan: 1. Metastatic colon cancer (T4, N0, M1) adenocarcinoma of the sigmoid colon status post sigmoid colectomy and partial right hepatectomy 04/11/2012. Microsatellite stable. No loss of mismatch repair proteins. K-ras wild-type on the initial codon 12 and 13 testing. K-ras Q61L and APC mutations identified on extended testing. BRAF not mutated. No HER-2 amplification on Foundation 1 testing.  She completed cycle 1 CAPOX beginning 05/25/2012.   She completed cycle 8 CAPOX beginning 10/19/2012.   CEA on 10/19/2012 normal at 0.8.   CEA 6.1 on 01/11/2013   Restaging CT on 01/11/2013 consistent with multiple new pulmonary metastases   Cycle 1 of FOLFIRI/Avastin 01/18/2013; cycle 2 02/01/2013,cycle 3 02/16/2013.   CEA 5.0 on 03/01/2013.   Cycle 4 FOLFIRI/Avastin 03/01/2013.   Cycle 5 FOLFIRI/Avastin 03/15/2013.   Restaging chest CT 03/29/2013 with possible slight decrease in  size of a left upper lobe metastasis. Other pulmonary nodules stable.   Cycle 6 FOLFIRI/Avastin 03/29/2013   Cycle 7 of FOLFIRI/Avastin 04/12/2013.   CEA 6.3 05/03/2013.   Cycle 8 FOLFIRI/Avastin 05/03/2013.   Cycle 9 FOLFIRI/Avastin 05/17/2013.   Cycle 10 FOLFIRI/Avastin 05/31/2013.   CEA 10.6 on 06/19/2013.   CT chest 06/19/2013 with mild progression of bilateral pulmonary nodules/metastases.   Decision made to continue FOLFIRI/Avastin. She completed cycle 11 06/21/2013.   Cycle 12 FOLFIRI/Avastin 07/05/2013   Cycle 13 FOLFIRI/Avastin 07/19/2013   Cycle 14 FOLFIRI/Avastin 08/01/2013.   CT chest 08/28/2013 with increased in size of bilateral lung nodules.   PET scan 09/22/2013 with multiple hypermetabolic pulmonary nodules bilaterally. Nodules demonstrate progressive enlargement compared with prior CTs. No evidence of extrathoracic metastatic disease.   Status post SRS 2 lung nodules, completed 10/18/2013  CT 01/12/2014 with airspace disease surrounding treated pulmonary nodules, no new nodules  01/12/2014 CEA 0.9  CT chest 04/12/2014 with improvement in airspace disease surrounding treated pulmonary nodules, no new nodules  04/12/2014 CEA 8.2  07/03/2014 CEA 60.3  CT 07/03/2014 with masslike enlargement of 2 treated lung lesions  Cycle 1 Lonsurf 07/12/2014.  Cycle 2 Lonsurf 08/11/2014 or 08/12/2014  Cycle 3 Lonsurf 09/08/2014  Cycle 4 Lonsurf 10/06/2014  Restaging CT of the chest on 10/01/2014 with stable lung nodules  Cycle 5 Lonsurf 11/05/2014  Cycle 6 Lonsurf 12/03/2014  Restaging CTs of the chest, abdomen, and pelvis on 12/26/2014 with no evidence of disease progression, stable lung nodules  Cycle 7 Lonsurf 12/31/2014  Cycle 8 Lonsurf 01/28/2015  Elevated CEA 03/12/2015  CT 03/19/2015 with  enlargement of bilateral lung masses and progressive bone metastases including an enlarging lesion at T6 with replacement of the vertebral  body  Cycle 1 FOLFOX/Avastin 03/25/2015  Cycle 2 FOLFOX/Avastin 04/08/2015  Cycle 3 FOLFOX/Avastin 04/23/2015  Cycle 4 FOLFOX/Avastin 05/08/2015  Cycle 5 FOLFOX/Avastin 05/22/2015  Restaging CT scans 06/10/2015 with decreased size of bilateral pulmonary metastases; stable hepatic steatosis and postoperative changes from partial hepatectomy; no definite liver metastases; mild increase in size of lytic bone metastasis in the right ilium; stable T6 vertebral body lytic metastasis  CT right femur 06/12/2015 with lesion in the right femur, status post placement of an intramedullary nail 06/15/2015  Cycle 1 Regorafenib beginning 08/02/2015 (prematurely discontinued)  Pathologic T6 compression fracture 08/19/2015 status post [facility] 08/26/2015  Cycle 2 Regorafenib beginning 08/29/2015 (dose reduced to 80 mg daily for 21 days followed by a 7 day break)  Cycle 3 Regorafenib beginning 09/26/2015 2. Microcytic anemia, iron deficiency. Improved.  3. Status post Port-A-Cath placement 03/14/2012. X-ray showed the Port-A-Cath tip to be in the azygos vein. The left-sided Port-A-Cath was removed and a new right Port-A-Cath was placed on 05/23/2012. 4. 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 Xarelto discontinued April 2017 due to rectal bleeding. 5. Hypertension-persistent on amlodipine. HCTZ added 12/26/2014 6. Delayed nausea following cycle 5 FOLFIRI/Avastin. Emend was added to the premedication regimen 7. Nausea secondary to Lonsurf-improved with Ativan 8. Mild oxaliplatin neuropathy 9. Right groin pain. Question referred pain from the hip. 10. CT right femur 06/12/2015 with a partially visualized 4.3 x 2.3 cm lucent lesion in the inferior aspect of the right iliac bone superior to the right acetabulum. Associated destruction of the cortex of the bone; second lesion noted in the midportion of the right femur measuring 1.7 x 6.6 cm.  Partial erosive changes of the cortex of the bone adjacent to the lesion.   Status post placement of an intramedullary nail right femur 06/15/2015.   She completed palliative radiation to the right femur and right pelvis 07/04/2015 through 07/19/2015. 11. Difficulty sleeping. She will try temazepam. 12. Anemia secondary to chronic disease, chemotherapy and rectal bleeding 13. Severe left leg pain-likely related to tumor involving the lumbosacral spine  Her pain appears much improved today. We will continue Decadron. She will continue Dilaudid as needed for pain.  We will ask physical therapy and occupational therapy to assess [name] ambulation and home needs.  She will be scheduled for discharge to home if her pain is adequately controlled and she is ambulatory on 10/04/2015.  She will begin palliative radiation today.   LOS: 1 day   Betsy Coder, MD   10/03/2015, 1:35 PM

## 2015-10-04 ENCOUNTER — Ambulatory Visit: Payer: Medicare Other | Admitting: Nurse Practitioner

## 2015-10-04 ENCOUNTER — Ambulatory Visit: Payer: Medicare Other | Admitting: Radiation Oncology

## 2015-10-04 ENCOUNTER — Telehealth: Payer: Self-pay | Admitting: Oncology

## 2015-10-04 ENCOUNTER — Ambulatory Visit
Admit: 2015-10-04 | Discharge: 2015-10-04 | Disposition: A | Discharge status: Home / Self Care | Payer: Medicare Other | Attending: Radiation Oncology | Admitting: Radiation Oncology

## 2015-10-04 ENCOUNTER — Other Ambulatory Visit: Payer: Medicare Other

## 2015-10-04 ENCOUNTER — Other Ambulatory Visit: Payer: Self-pay | Admitting: Nurse Practitioner

## 2015-10-04 ENCOUNTER — Telehealth: Payer: Self-pay | Admitting: *Deleted

## 2015-10-04 DIAGNOSIS — C187 Malignant neoplasm of sigmoid colon: Secondary | ICD-10-CM

## 2015-10-04 DIAGNOSIS — C7801 Secondary malignant neoplasm of right lung: Secondary | ICD-10-CM

## 2015-10-04 DIAGNOSIS — C7951 Secondary malignant neoplasm of bone: Secondary | ICD-10-CM

## 2015-10-04 DIAGNOSIS — G893 Neoplasm related pain (acute) (chronic): Principal | ICD-10-CM

## 2015-10-04 DIAGNOSIS — G620 Drug-induced polyneuropathy: Secondary | ICD-10-CM

## 2015-10-04 DIAGNOSIS — G4700 Insomnia, unspecified: Secondary | ICD-10-CM

## 2015-10-04 DIAGNOSIS — C787 Secondary malignant neoplasm of liver and intrahepatic bile duct: Secondary | ICD-10-CM

## 2015-10-04 MED ORDER — HYDROMORPHONE HCL 4 MG PO TABS: ORAL_TABLET | ORAL | Status: AC

## 2015-10-04 MED ORDER — DEXAMETHASONE 4 MG PO TABS
4.0000 mg | ORAL_TABLET | Freq: Two times a day (BID) | ORAL | Status: DC
  Administered 2015-10-04 – 2015-10-05 (×2): 4 mg via ORAL
  Filled 2015-10-04 (×2): qty 1

## 2015-10-04 MED ORDER — DEXAMETHASONE 4 MG PO TABS: 4.0000 mg | ORAL_TABLET | Freq: Two times a day (BID) | ORAL | Status: AC

## 2015-10-04 NOTE — Telephone Encounter (Signed)
Call from [place], Therapist, sports at [employer]. Physical therapy is requesting an order for L AFO. Reviewed with Dr. Benay Spice: OK for orthotic. Verbal order called to [place].

## 2015-10-04 NOTE — Progress Notes (Signed)
IP PROGRESS NOTE  Subjective:   [name] reports pain was significantly improved this morning, worse this afternoon. She just received a dose of IV Dilaudid. Continued left leg weakness. She had a bowel movement.  Objective: Vital signs in last 24 hours: Blood pressure 172/84, pulse 87, temperature 98 F (36.7 C), temperature source Oral, resp. rate 16, height '5\' 11"'$  (1.803 m), weight 247 lb (112.038 kg), SpO2 96 %.  Intake/Output from previous day: 06/08 0701 - 06/09 0700 In: 1080 [P.O.:1080] Out: -   Physical Exam: HEENT: No thrush Extremities: No leg edema Neurologic: Left foot drop with 3/5 strength with dorsi flexion at the left foot, 4/5 strength with flexion at the left hip and plantar flexion at the left foot  Portacath/PICC-without erythema  Lab Results:  Recent Labs  10/02/15 1710  WBC 12.2*  HGB 12.3  HCT 37.1  PLT 185    BMET  Recent Labs  10/02/15 1710  CREATININE 0.66    Medications: I have reviewed the patient's current medications.  Assessment/Plan: 1. Metastatic colon cancer (T4, N0, M1) adenocarcinoma of the sigmoid colon status post sigmoid colectomy and partial right hepatectomy 04/11/2012. Microsatellite stable. No loss of mismatch repair proteins. K-ras wild-type on the initial codon 12 and 13 testing. K-ras Q61L and APC mutations identified on extended testing. BRAF not mutated. No HER-2 amplification on Foundation 1 testing.  She completed cycle 1 CAPOX beginning 05/25/2012.   She completed cycle 8 CAPOX beginning 10/19/2012.   CEA on 10/19/2012 normal at 0.8.   CEA 6.1 on 01/11/2013   Restaging CT on 01/11/2013 consistent with multiple new pulmonary metastases   Cycle 1 of FOLFIRI/Avastin 01/18/2013; cycle 2 02/01/2013,cycle 3 02/16/2013.   CEA 5.0 on 03/01/2013.   Cycle 4 FOLFIRI/Avastin 03/01/2013.   Cycle 5 FOLFIRI/Avastin 03/15/2013.   Restaging chest CT 03/29/2013 with possible slight decrease in size of a left  upper lobe metastasis. Other pulmonary nodules stable.   Cycle 6 FOLFIRI/Avastin 03/29/2013   Cycle 7 of FOLFIRI/Avastin 04/12/2013.   CEA 6.3 05/03/2013.   Cycle 8 FOLFIRI/Avastin 05/03/2013.   Cycle 9 FOLFIRI/Avastin 05/17/2013.   Cycle 10 FOLFIRI/Avastin 05/31/2013.   CEA 10.6 on 06/19/2013.   CT chest 06/19/2013 with mild progression of bilateral pulmonary nodules/metastases.   Decision made to continue FOLFIRI/Avastin. She completed cycle 11 06/21/2013.   Cycle 12 FOLFIRI/Avastin 07/05/2013   Cycle 13 FOLFIRI/Avastin 07/19/2013   Cycle 14 FOLFIRI/Avastin 08/01/2013.   CT chest 08/28/2013 with increased in size of bilateral lung nodules.   PET scan 09/22/2013 with multiple hypermetabolic pulmonary nodules bilaterally. Nodules demonstrate progressive enlargement compared with prior CTs. No evidence of extrathoracic metastatic disease.   Status post SRS 2 lung nodules, completed 10/18/2013  CT 01/12/2014 with airspace disease surrounding treated pulmonary nodules, no new nodules  01/12/2014 CEA 0.9  CT chest 04/12/2014 with improvement in airspace disease surrounding treated pulmonary nodules, no new nodules  04/12/2014 CEA 8.2  07/03/2014 CEA 60.3  CT 07/03/2014 with masslike enlargement of 2 treated lung lesions  Cycle 1 Lonsurf 07/12/2014.  Cycle 2 Lonsurf 08/11/2014 or 08/12/2014  Cycle 3 Lonsurf 09/08/2014  Cycle 4 Lonsurf 10/06/2014  Restaging CT of the chest on 10/01/2014 with stable lung nodules  Cycle 5 Lonsurf 11/05/2014  Cycle 6 Lonsurf 12/03/2014  Restaging CTs of the chest, abdomen, and pelvis on 12/26/2014 with no evidence of disease progression, stable lung nodules  Cycle 7 Lonsurf 12/31/2014  Cycle 8 Lonsurf 01/28/2015  Elevated CEA 03/12/2015  CT 03/19/2015 with enlargement of  bilateral lung masses and progressive bone metastases including an enlarging lesion at T6 with replacement of the vertebral body  Cycle 1  FOLFOX/Avastin 03/25/2015  Cycle 2 FOLFOX/Avastin 04/08/2015  Cycle 3 FOLFOX/Avastin 04/23/2015  Cycle 4 FOLFOX/Avastin 05/08/2015  Cycle 5 FOLFOX/Avastin 05/22/2015  Restaging CT scans 06/10/2015 with decreased size of bilateral pulmonary metastases; stable hepatic steatosis and postoperative changes from partial hepatectomy; no definite liver metastases; mild increase in size of lytic bone metastasis in the right ilium; stable T6 vertebral body lytic metastasis  CT right femur 06/12/2015 with lesion in the right femur, status post placement of an intramedullary nail 06/15/2015  Cycle 1 Regorafenib beginning 08/02/2015 (prematurely discontinued)  Pathologic T6 compression fracture 08/19/2015 status post [facility] 08/26/2015  Cycle 2 Regorafenib beginning 08/29/2015 (dose reduced to 80 mg daily for 21 days followed by a 7 day break)  Cycle 3 Regorafenib beginning 09/26/2015 2. Microcytic anemia, iron deficiency. Improved.  3. Status post Port-A-Cath placement 03/14/2012. X-ray showed the Port-A-Cath tip to be in the azygos vein. The left-sided Port-A-Cath was removed and a new right Port-A-Cath was placed on 05/23/2012. 4. 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 Xarelto discontinued April 2017 due to rectal bleeding. 5. Hypertension-persistent on amlodipine. HCTZ added 12/26/2014 6. Delayed nausea following cycle 5 FOLFIRI/Avastin. Emend was added to the premedication regimen 7. Nausea secondary to Lonsurf-improved with Ativan 8. Mild oxaliplatin neuropathy 9. Right groin pain. Question referred pain from the hip. 10. CT right femur 06/12/2015 with a partially visualized 4.3 x 2.3 cm lucent lesion in the inferior aspect of the right iliac bone superior to the right acetabulum. Associated destruction of the cortex of the bone; second lesion noted in the midportion of the right femur measuring 1.7 x 6.6 cm. Partial erosive  changes of the cortex of the bone adjacent to the lesion.   Status post placement of an intramedullary nail right femur 06/15/2015.   She completed palliative radiation to the right femur and right pelvis 07/04/2015 through 07/19/2015. 11. Difficulty sleeping. She will try temazepam. 12. Anemia secondary to chronic disease, chemotherapy and rectal bleeding 13. Severe left leg pain-likely related to tumor involving the lumbosacral spine  She is having increased pain this afternoon, requiring IV Dilaudid. We decided to hold on discharge today and plan for possible discharge tomorrow, 10/05/2015, if pain is adequately controlled. Dexamethasone was changed to 4 mg by mouth twice daily.    LOS: 2 days   Ned Card, NP   10/04/2015, 3:24 PM  [name] was interviewed and examined. The plan is to discharge her to home when the pain is adequately controlled and she is ambulatory.

## 2015-10-04 NOTE — Progress Notes (Signed)
OT Cancellation Note  Patient Details Name: [name] MRN: [id number] DOB: 1955/12/06   Cancelled Treatment:    Reason Eval/Treat Not Completed: OT screened, no needs identified, will sign off. Pt has 24/7 and all DME at home. She feels like she can pretty much care for herself and she has help as needed.  She already paces herself to make her energy last. She is really looking forward to having AFO to make walking easier for her.  Will sign off from OT at this time  [facility] 10/04/2015, 1:47 PM  Lesle Chris, OTR/L [phone] 10/04/2015

## 2015-10-04 NOTE — Discharge Instructions (Signed)
Call for increased pain, leg weakness, or difficulty with bowel or bladder control

## 2015-10-04 NOTE — Discharge Summary (Signed)
Physician Discharge Summary  Patient ID: [name]  MRN: [id number] DOB/AGE: 04-Jun-1955 60 y.o.  Admit date: 10/02/2015 Discharge date: 10/05/2015    Discharge Diagnoses:  1. 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 Severe left leg pain likely related to tumor involving the lumbosacral spine, currently completing palliative radiation.  Discharge Condition: Stable  Discharge Labs:  None  Significant Diagnostic Studies: None  Consults: Dr. Lisbeth Renshaw, Radiation oncology  Procedures: Palliative radiation lumbosacral spine 10/03/2015, 10/04/2015  Disposition: 01-Home or Self Care     Medication List    STOP taking these medications        IRON PO     STIVARGA 40 MG tablet  Generic drug:  regorafenib      TAKE these medications        amLODipine 10 MG tablet  Commonly known as:  NORVASC  TAKE 1 TABLET BY MOUTH EVERY DAY     dexamethasone 4 MG tablet  Commonly known as:  DECADRON  Take 1 tablet (4 mg total) by mouth 2 (two) times daily.     diclofenac sodium 1 % Gel  Commonly known as:  VOLTAREN  Apply 2 g topically 4 (four) times daily.     hydrochlorothiazide 12.5 MG capsule  Commonly known as:  MICROZIDE  Take 1 capsule (12.5 mg total) by mouth daily.     HYDROmorphone 4 MG tablet  Commonly known as:  DILAUDID  TAKE 1-2 TABLET BY MOUTH EVERY 4 HOURS AS NEEDED FOR MODERATE OR SEVERE PAIN     lidocaine-prilocaine cream  Commonly known as:  EMLA  Apply topically as needed. Apply to [facility] site 1-2 hours prior to stick and cover with plastic wrap to numb site     loperamide 2 MG capsule  Commonly known as:  IMODIUM  TAKE ONE CAPSULE BY MOUTH AS NEEDED FOR DIARRHEA OR LOOSE STOOLS. MAX OF 8 CAPSULES PER DAY     LORazepam 0.5 MG tablet  Commonly known as:  ATIVAN  Place 1 tablet (0.5 mg total) under the tongue every 6 (six) hours as needed (for nausea). Causes drowsiness     multivitamin tablet  Take 1 tablet by mouth daily.     ondansetron 8 MG tablet   Commonly known as:  ZOFRAN  TAKE 1 TABLET BY MOUTH EVERY 8 HOURS AS NEEDED FOR NAUSEA AND VOMITING     polyethylene glycol powder powder  Commonly known as:  MIRALAX  Take 17 g by mouth daily as needed.     potassium chloride SA 20 MEQ tablet  Commonly known as:  K-DUR,KLOR-CON  TAKE 1 TABLET BY MOUTH DAILY     prochlorperazine 10 MG tablet  Commonly known as:  COMPAZINE  TAKE 1 TABLET BY MOUTH EVERY 6 HOURS AS NEEDED FOR NAUSEA OR VOMITING            Follow-up Information    Follow up with Betsy Coder, MD On 10/07/2015.   Specialty:  Oncology   Contact information:   [place] [address] [phone]       HPI: [name] is a 60 year old woman with metastatic colon cancer. She has been treated with multiple systemic therapies in the past. At the time of the current admission she was being treated with regorafenib. She completed SRS treatment to the thoracic spine 08/26/2015 for management of severe pain related to a pathologic compression fracture. The pain improved.  She was seen in the emergency room on 09/30/2015. MRI of the spine revealed  metastatic disease involving the lumbar spine and sacrum. Extensive involvement of S1 and S2 on the left side was noted. She was discharged home.  She presented on 10/02/2015 with severe pain involving the left leg. She was also experiencing numbness and weakness of the left leg. The pain was not relieved with oral Dilaudid. She took an increased dose of Decadron on 10/01/2015 with some improvement in the pain.   Hospital Course: [name] was admitted to [facility] on 10/02/2015 with severe pain involving the left leg. The pain was suspected to be related to nerve root involvement by tumor at the lumbosacral spine. She received IV narcotic analgesics and dexamethasone. She was seen in consult by Dr. Lisbeth Renshaw, radiation oncology. She began palliative radiation to the lumbosacral spine on 10/03/2015.  On the  morning of 10/04/2015 her pain was better. The initial plan was for discharge home later in the day. On the afternoon of 10/04/2015 she was having increased pain requiring IV pain medication. We decided to delay the discharge to make sure her pain is adequately controlled. The plan is to discharge her to home when the pain is adequately controlled and she is ambulatory, possibly 10/05/2015.  She appeared stable for discharge on 6/10   Signed: Ned Card 10/04/2015, 4:09 PM

## 2015-10-04 NOTE — Evaluation (Signed)
Physical Therapy Evaluation Patient Details Name: Alisen Marsiglia MRN: [id number] DOB: [birth year] Today's Date: 10/04/2015   History of Present Illness  60 yo female admitted with colon cancer, L LE pain, weakness, numbness. Hx of colon cancer with bony mets, breast cancer, path fx T6, HTN, neuropathy, spinal stenosis  Clinical Impression  On eval, pt required Min guard assist to stand and take a few lateral steps along side of bed with RW. Session limited due to tech waiting to take pt to radiation. Discussed mobility at home prior to admission-pt reports she has still been able to ascend/descend steps as needed since L LE deficits began. Educated pt on possible benefit of L AFO. Verbally reviewed safe technique for stair negotiation "up with good, down with bad". Spoke with RN about getting order placed for [employer] consult for L AFO. Discussed d/c plan-pt reports she will return home. Recommend HHPT follow up.     Follow Up Recommendations Home health PT;Supervision/Assistance - 24 hour    Equipment Recommendations  Wheelchair ;Wheelchair cushion; L AFO-will need [employer] consult for this.    Recommendations for Other Services       Precautions / Restrictions Precautions Precautions: Fall Restrictions Weight Bearing Restrictions: No      Mobility  Bed Mobility Overal bed mobility: Modified Independent                Transfers Overall transfer level: Needs assistance Equipment used: Rolling walker (2 wheeled) Transfers: Sit to/from Stand Sit to Stand: Min guard;From elevated surface         General transfer comment: close guard for safety. Increased time. VCs safety, hand placement, technique.   Ambulation/Gait     Assistive device: Rolling walker (2 wheeled) Gait Pattern/deviations: Step-to pattern;Decreased dorsiflexion - left     General Gait Details: Radiation tech waiting to take pt to procedure so unable to assess gait/ambulation. Pt was able to weightbear and  take lateral steps toward [facility] with RW.   Stairs            Wheelchair Mobility    Modified Rankin (Stroke Patients Only)       Balance                                             Pertinent Vitals/Pain Pain Assessment: 0-10 Pain Score: 8  Pain Location: L LE Pain Intervention(s): Monitored during session    Home Living Family/patient expects to be discharged to:: Private residence Living Arrangements: Children Available Help at Discharge: Family Type of Home: Apartment Home Access: Level entry     Home Layout: Two level Home Equipment: Cane - single point;Bedside commode;Shower seat      Prior Function                 Hand Dominance        Extremity/Trunk Assessment   Upper Extremity Assessment: Overall WFL for tasks assessed           Lower Extremity Assessment: RLE deficits/detail;LLE deficits/detail RLE Deficits / Details: WFL LLE Deficits / Details: DF 1/5, PF at least 3/5, hip flex at least 3/5, knee ext at least 3/5. LE numbness.  Cervical / Trunk Assessment: Normal  Communication   Communication: No difficulties  Cognition Arousal/Alertness: Awake/alert Behavior During Therapy: WFL for tasks assessed/performed Overall Cognitive Status: Within Functional Limits for tasks assessed  General Comments      Exercises        Assessment/Plan    PT Assessment Patient needs continued PT services  PT Diagnosis Difficulty walking;Abnormality of gait   PT Problem List Decreased strength;Decreased range of motion;Decreased balance;Decreased mobility;Pain;Decreased knowledge of use of DME  PT Treatment Interventions DME instruction;Gait training;Stair training;Functional mobility training;Therapeutic activities;Patient/family education;Balance training;Therapeutic exercise   PT Goals (Current goals can be found in the Care Plan section) Acute Rehab PT Goals Patient Stated Goal: home PT Goal  Formulation: With patient Time For Goal Achievement: 10/18/15 Potential to Achieve Goals: Good    Frequency Min 3X/week   Barriers to discharge        Co-evaluation               End of Session   Activity Tolerance: Patient tolerated treatment well Patient left: in bed;with call bell/phone within reach           Time: 1101-1115 PT Time Calculation (min) (ACUTE ONLY): 14 min   Charges:   PT Evaluation $PT Eval Low Complexity: 1 Procedure     PT G Codes:        Weston Anna, MPT Pager: [phone]

## 2015-10-04 NOTE — Telephone Encounter (Signed)
s.w. pt and advised on june appt.Marland KitchenMarland KitchenMarland KitchenMarland Kitchenpt ok and aware

## 2015-10-05 MED ORDER — HEPARIN SOD (PORK) LOCK FLUSH 100 UNIT/ML IV SOLN
500.0000 [IU] | Freq: Once | INTRAVENOUS | Status: DC
  Administered 2015-10-05: 500 [IU] via INTRAVENOUS
  Filled 2015-10-05: qty 5

## 2015-10-05 NOTE — Progress Notes (Signed)
IP PROGRESS NOTE  Subjective:   [name] reports improvement in the left leg pain. She continues to have numbness and weakness of the left foot. She can ambulate to the bedside commode.   Objective: Vital signs in last 24 hours: Blood pressure 166/90, pulse 108, temperature 97.8 F (36.6 C), temperature source Oral, resp. rate 16, height _0  (1.803 m), weight 247 lb (112.038 kg), SpO2 98 %.  Intake/Output from previous day: 06/09 0701 - 06/10 0700 In: 1195 [P.O.:1195] Out: 400 [Urine:400]  Physical Exam: Lungs: Clear bilaterally Cardiac: Regular rate and rhythm Abdomen: Nontender Extremities: No leg edema Neurologic: 3/5 strength with dorsi flexion at the left foot,   Portacath/PICC-without erythema  Lab Results:  Recent Labs  10/02/15 1710  WBC 12.2*  HGB 12.3  HCT 37.1  PLT 185    BMET  Recent Labs  10/02/15 1710  CREATININE 0.66    Medications: I have reviewed the patient's current medications.  Assessment/Plan: 1. Metastatic colon cancer (T4, N0, M1) adenocarcinoma of the sigmoid colon status post sigmoid colectomy and partial right hepatectomy 04/11/2012. Microsatellite stable. No loss of mismatch repair proteins. K-ras wild-type on the initial codon 12 and 13 testing. K-ras Q61L and APC mutations identified on extended testing. BRAF not mutated. No HER-2 amplification on Foundation 1 testing.  She completed cycle 1 CAPOX beginning 05/25/2012.   She completed cycle 8 CAPOX beginning 10/19/2012.   CEA on 10/19/2012 normal at 0.8.   CEA 6.1 on 01/11/2013   Restaging CT on 01/11/2013 consistent with multiple new pulmonary metastases   Cycle 1 of FOLFIRI/Avastin 01/18/2013; cycle 2 02/01/2013,cycle 3 02/16/2013.   CEA 5.0 on 03/01/2013.   Cycle 4 FOLFIRI/Avastin 03/01/2013.   Cycle 5 FOLFIRI/Avastin 03/15/2013.   Restaging chest CT 03/29/2013 with possible slight decrease in size of a left upper lobe metastasis. Other pulmonary nodules  stable.   Cycle 6 FOLFIRI/Avastin 03/29/2013   Cycle 7 of FOLFIRI/Avastin 04/12/2013.   CEA 6.3 05/03/2013.   Cycle 8 FOLFIRI/Avastin 05/03/2013.   Cycle 9 FOLFIRI/Avastin 05/17/2013.   Cycle 10 FOLFIRI/Avastin 05/31/2013.   CEA 10.6 on 06/19/2013.   CT chest 06/19/2013 with mild progression of bilateral pulmonary nodules/metastases.   Decision made to continue FOLFIRI/Avastin. She completed cycle 11 06/21/2013.   Cycle 12 FOLFIRI/Avastin 07/05/2013   Cycle 13 FOLFIRI/Avastin 07/19/2013   Cycle 14 FOLFIRI/Avastin 08/01/2013.   CT chest 08/28/2013 with increased in size of bilateral lung nodules.   PET scan 09/22/2013 with multiple hypermetabolic pulmonary nodules bilaterally. Nodules demonstrate progressive enlargement compared with prior CTs. No evidence of extrathoracic metastatic disease.   Status post SRS 2 lung nodules, completed 10/18/2013  CT 01/12/2014 with airspace disease surrounding treated pulmonary nodules, no new nodules  01/12/2014 CEA 0.9  CT chest 04/12/2014 with improvement in airspace disease surrounding treated pulmonary nodules, no new nodules  04/12/2014 CEA 8.2  07/03/2014 CEA 60.3  CT 07/03/2014 with masslike enlargement of 2 treated lung lesions  Cycle 1 Lonsurf 07/12/2014.  Cycle 2 Lonsurf 08/11/2014 or 08/12/2014  Cycle 3 Lonsurf 09/08/2014  Cycle 4 Lonsurf 10/06/2014  Restaging CT of the chest on 10/01/2014 with stable lung nodules  Cycle 5 Lonsurf 11/05/2014  Cycle 6 Lonsurf 12/03/2014  Restaging CTs of the chest, abdomen, and pelvis on 12/26/2014 with no evidence of disease progression, stable lung nodules  Cycle 7 Lonsurf 12/31/2014  Cycle 8 Lonsurf 01/28/2015  Elevated CEA 03/12/2015  CT 03/19/2015 with enlargement of bilateral lung masses and progressive bone metastases including an enlarging lesion at  T6 with replacement of the vertebral body  Cycle 1 FOLFOX/Avastin 03/25/2015  Cycle 2  FOLFOX/Avastin 04/08/2015  Cycle 3 FOLFOX/Avastin 04/23/2015  Cycle 4 FOLFOX/Avastin 05/08/2015  Cycle 5 FOLFOX/Avastin 05/22/2015  Restaging CT scans 06/10/2015 with decreased size of bilateral pulmonary metastases; stable hepatic steatosis and postoperative changes from partial hepatectomy; no definite liver metastases; mild increase in size of lytic bone metastasis in the right ilium; stable T6 vertebral body lytic metastasis  CT right femur 06/12/2015 with lesion in the right femur, status post placement of an intramedullary nail 06/15/2015  Cycle 1 Regorafenib beginning 08/02/2015 (prematurely discontinued)  Pathologic T6 compression fracture 08/19/2015 status post [facility] 08/26/2015  Cycle 2 Regorafenib beginning 08/29/2015 (dose reduced to 80 mg daily for 21 days followed by a 7 day break)  Cycle 3 Regorafenib beginning 09/26/2015 2. Microcytic anemia, iron deficiency. Improved.  3. Status post Port-A-Cath placement 03/14/2012. X-ray showed the Port-A-Cath tip to be in the azygos vein. The left-sided Port-A-Cath was removed and a new right Port-A-Cath was placed on 05/23/2012. 4. 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 Xarelto discontinued April 2017 due to rectal bleeding. 5. Hypertension-persistent on amlodipine. HCTZ added 12/26/2014 6. Delayed nausea following cycle 5 FOLFIRI/Avastin. Emend was added to the premedication regimen 7. Nausea secondary to Lonsurf-improved with Ativan 8. Mild oxaliplatin neuropathy 9. Right groin pain. Question referred pain from the hip. 10. CT right femur 06/12/2015 with a partially visualized 4.3 x 2.3 cm lucent lesion in the inferior aspect of the right iliac bone superior to the right acetabulum. Associated destruction of the cortex of the bone; second lesion noted in the midportion of the right femur measuring 1.7 x 6.6 cm. Partial erosive changes of the cortex of the bone  adjacent to the lesion.   Status post placement of an intramedullary nail right femur 06/15/2015.   She completed palliative radiation to the right femur and right pelvis 07/04/2015 through 07/19/2015. 11. Difficulty sleeping. She will try temazepam. 12. Anemia secondary to chronic disease, chemotherapy and rectal bleeding 13. Severe left leg pain-likely related to tumor involving the lumbosacral spine  Palliative radiation to lumbosacral spine initiated 10/03/15  She appears stable for discharge to home today.  Outpatient f/u is scheduled at the [facility].  She is being fitted for a left foot brace.  LOS: 3 days   Betsy Coder, MD   10/05/2015, 7:08 AM

## 2015-10-05 NOTE — Progress Notes (Signed)
Patient discharged to home with family, discharge instructions reviewed with patient who verbalized understanding. New RX given to patient. Brace for left lower leg was placed in shoe and patient ambulated with walker, understands she needs to notify MD if if any problems with brace.

## 2015-10-06 NOTE — Progress Notes (Signed)
Department of Radiation Oncology  Phone:  [phone] Fax:        [phone]  Weekly Treatment Note    Name: [name] Date: 10/06/2015 MRN: [id number] DOB: 1955/06/26   Diagnosis:     ICD-9-CM ICD-10-CM   1. Bone metastasis (HCC) 198.5 C79.51      Current dose: 6 Gy  Current fraction: 2   MEDICATIONS: Current Outpatient Prescriptions  Medication Sig Dispense Refill  . amLODipine (NORVASC) 10 MG tablet TAKE 1 TABLET BY MOUTH EVERY DAY (Patient taking differently: TAKE 10 MG BY MOUTH EVERY DAY) 90 tablet 0  . dexamethasone (DECADRON) 4 MG tablet Take 1 tablet (4 mg total) by mouth 2 (two) times daily. 90 tablet 0  . hydrochlorothiazide (MICROZIDE) 12.5 MG capsule Take 1 capsule (12.5 mg total) by mouth daily. 30 capsule 1  . HYDROmorphone (DILAUDID) 4 MG tablet TAKE 1-2 TABLET BY MOUTH EVERY 4 HOURS AS NEEDED FOR MODERATE OR SEVERE PAIN 75 tablet 0  . lidocaine-prilocaine (EMLA) cream Apply topically as needed. Apply to [facility] site 1-2 hours prior to stick and cover with plastic wrap to numb site (Patient taking differently: Apply 1 application topically daily as needed (port access). Apply to [facility] site 1-2 hours prior to stick and cover with plastic wrap to numb site) 30 g 3  . loperamide (IMODIUM) 2 MG capsule TAKE ONE CAPSULE BY MOUTH AS NEEDED FOR DIARRHEA OR LOOSE STOOLS. MAX OF 8 CAPSULES PER DAY (Patient not taking: Reported on 09/11/2015) 30 capsule 1  . LORazepam (ATIVAN) 0.5 MG tablet Place 1 tablet (0.5 mg total) under the tongue every 6 (six) hours as needed (for nausea). Causes drowsiness 30 tablet 0  . Multiple Vitamins-Minerals (MULTIVITAMIN) tablet Take 1 tablet by mouth daily.    . ondansetron (ZOFRAN) 8 MG tablet TAKE 1 TABLET BY MOUTH EVERY 8 HOURS AS NEEDED FOR NAUSEA AND VOMITING 20 tablet 2  . polyethylene glycol powder (MIRALAX) powder Take 17 g by mouth daily as needed. (Patient taking differently: Take 17 g by mouth daily as needed for mild constipation  or moderate constipation. ) 255 g 0  . potassium chloride SA (K-DUR,KLOR-CON) 20 MEQ tablet TAKE 1 TABLET BY MOUTH DAILY (Patient not taking: Reported on 09/30/2015) 90 tablet 1  . prochlorperazine (COMPAZINE) 10 MG tablet TAKE 1 TABLET BY MOUTH EVERY 6 HOURS AS NEEDED FOR NAUSEA OR VOMITING 30 tablet 1   No current facility-administered medications for this encounter.     ALLERGIES: Review of patient's allergies indicates no known allergies.   LABORATORY DATA:  Lab Results  Component Value Date   WBC 12.2* 10/02/2015   HGB 12.3 10/02/2015   HCT 37.1 10/02/2015   MCV 85.7 10/02/2015   PLT 185 10/02/2015   Lab Results  Component Value Date   NA 138 09/30/2015   K 4.2 09/30/2015   CL 104 09/30/2015   CO2 25 09/30/2015   Lab Results  Component Value Date   ALT 70* 09/20/2015   AST 33 09/20/2015   ALKPHOS 100 09/20/2015   BILITOT 0.46 09/20/2015     NARRATIVE: Laurelin Elson was seen today for weekly treatment management. The chart was checked and the patient's films were reviewed.  The patient has begun her course of radiation treatment this week. No difficulties so far. No nausea.  PHYSICAL EXAMINATION: vitals were not taken for this visit.     Alert, no acute distress  ASSESSMENT: The patient is doing satisfactorily with treatment.  PLAN: We will continue  with the patient's radiation treatment as planned.

## 2015-10-07 ENCOUNTER — Encounter: Payer: Self-pay | Admitting: *Deleted

## 2015-10-07 ENCOUNTER — Ambulatory Visit: Payer: Medicare Other

## 2015-10-07 NOTE — Progress Notes (Signed)
Call received from patients daughter inquiring if Decadron and Dilaudid could be taken together.  Pt.'s daughter informed that Decadron and Dilaudid can be taken together.  Details of each prescription reviewed with patient's daughter and she verbalized an understanding of prescriptions.  Teach back done.

## 2015-10-07 NOTE — Progress Notes (Signed)
Call received from [facility] with [facility] requesting MD order to continue PT/OT and nurses aide for patient.  Also requesting discharge summary from patients recent hospitalization be faxed to office. Order given per Dr. Benay Spice for PT/OT and nurses aide.  Call placed to [facility] to inform her of MD order and discharge summary faxed.  Levada Dy appreciative of return call.

## 2015-10-08 ENCOUNTER — Ambulatory Visit: Admit: 2015-10-08 | Payer: Medicare Other

## 2015-10-09 ENCOUNTER — Telehealth: Payer: Self-pay | Admitting: *Deleted

## 2015-10-09 ENCOUNTER — Encounter: Payer: Self-pay | Admitting: *Deleted

## 2015-10-09 ENCOUNTER — Ambulatory Visit
Admission: RE | Admit: 2015-10-09 | Discharge: 2015-10-09 | Disposition: A | Discharge status: Home / Self Care | Payer: Medicare Other | Source: Ambulatory Visit | Attending: Radiation Oncology | Admitting: Radiation Oncology

## 2015-10-09 DIAGNOSIS — Z510 Encounter for antineoplastic radiation therapy: Secondary | ICD-10-CM | POA: Diagnosis not present

## 2015-10-09 DIAGNOSIS — C7951 Secondary malignant neoplasm of bone: Secondary | ICD-10-CM | POA: Diagnosis present

## 2015-10-09 DIAGNOSIS — C801 Malignant (primary) neoplasm, unspecified: Secondary | ICD-10-CM | POA: Diagnosis not present

## 2015-10-09 NOTE — Progress Notes (Signed)
[place] Work  Clinical Social Work was referred by patient for assistance with wheelchair. Pt reports she had requested wheelchair several weeks ago and this still has not been delivered.  Clinical Social Worker contacted RN for possible follow up with this issue. Pt reports great difficulty to get here for her radiation with out a wheelchair for assistance to get on and off the [facility] bus.   Clinical Social Work interventions:  Pt advocacy [name], [place] Worker [place]  [place] Phone: [phone] Fax: [phone]

## 2015-10-09 NOTE — Telephone Encounter (Signed)
Called the pateint to check to see if she was coming for her 2pm appt today,"Yes,I'll be there, I;m sorry I got mixed up and thought  10/14/15 was going to start back,But I'll be there toay at 2pm for treatment Thanked, MS.Luciana Axe, called [facility]1,informed Raquel Sarna RT therapist of status 8:34 AM

## 2015-10-09 NOTE — Telephone Encounter (Signed)
Return call from Manuela Schwartz, they did receive an order for wheelchair. She will arrange for wheelchair delivery/ pick up while pt is in the office for radiation on 6/16.

## 2015-10-09 NOTE — Telephone Encounter (Signed)
Message from [place]: Pt contacted social work for help in obtaining wheelchair. Pt is scheduled to complete radiation on 6/23. Continues PT/OT. Will review with Dr. Benay Spice. Noted order was written for wheelchair while pt was in ED on 09/30/15. Spoke with Manuela Schwartz, [facility] liaison. She will look into order.

## 2015-10-10 ENCOUNTER — Ambulatory Visit
Admit: 2015-10-10 | Discharge: 2015-10-10 | Disposition: A | Discharge status: Home / Self Care | Payer: Medicare Other | Attending: Radiation Oncology | Admitting: Radiation Oncology

## 2015-10-10 ENCOUNTER — Telehealth: Payer: Self-pay | Admitting: *Deleted

## 2015-10-10 DIAGNOSIS — Z510 Encounter for antineoplastic radiation therapy: Secondary | ICD-10-CM | POA: Diagnosis not present

## 2015-10-10 NOTE — Telephone Encounter (Signed)
TC from [facility] with request for Verbal Order for Occupational Therapy for patient at home. Verbal ok given.

## 2015-10-11 ENCOUNTER — Ambulatory Visit: Admission: RE | Admit: 2015-10-11 | Payer: Medicare Other | Source: Ambulatory Visit | Admitting: Radiation Oncology

## 2015-10-11 ENCOUNTER — Telehealth: Payer: Self-pay | Admitting: *Deleted

## 2015-10-11 ENCOUNTER — Ambulatory Visit
Admission: RE | Admit: 2015-10-11 | Discharge: 2015-10-11 | Disposition: A | Discharge status: Home / Self Care | Payer: Medicare Other | Source: Ambulatory Visit | Attending: Radiation Oncology | Admitting: Radiation Oncology

## 2015-10-11 ENCOUNTER — Ambulatory Visit: Payer: Medicare Other

## 2015-10-11 NOTE — Telephone Encounter (Signed)
Spoke with pt, she has not heard from [facility] re: wheelchair. Spoke with Manuela Schwartz, [facility] liaison, she is processing order. Manuela Schwartz will try to have wheelchair delivered to [facility] for pt to take home 6/19.

## 2015-10-14 ENCOUNTER — Telehealth: Payer: Self-pay | Admitting: Nurse Practitioner

## 2015-10-14 ENCOUNTER — Ambulatory Visit: Payer: Medicare Other | Admitting: Nurse Practitioner

## 2015-10-14 ENCOUNTER — Ambulatory Visit
Admission: RE | Admit: 2015-10-14 | Discharge: 2015-10-14 | Disposition: A | Discharge status: Home / Self Care | Payer: Medicare Other | Source: Ambulatory Visit | Attending: Radiation Oncology | Admitting: Radiation Oncology

## 2015-10-14 DIAGNOSIS — Z510 Encounter for antineoplastic radiation therapy: Secondary | ICD-10-CM | POA: Diagnosis not present

## 2015-10-14 NOTE — Telephone Encounter (Signed)
pt called to figure out when next apt is.. pt missed office visit today, pt resched apt for 6/29

## 2015-10-15 ENCOUNTER — Ambulatory Visit
Admit: 2015-10-15 | Discharge: 2015-10-15 | Disposition: A | Discharge status: Home / Self Care | Payer: Medicare Other | Attending: Radiation Oncology | Admitting: Radiation Oncology

## 2015-10-15 ENCOUNTER — Telehealth: Payer: Self-pay | Admitting: *Deleted

## 2015-10-15 DIAGNOSIS — Z510 Encounter for antineoplastic radiation therapy: Secondary | ICD-10-CM | POA: Diagnosis not present

## 2015-10-15 NOTE — Telephone Encounter (Signed)
Call from [place] in radiation: pt is asking for an update on status of wheelchair.  Called Jermaine with [facility]: He stated he will deliver wheelchair to pt during 6/21 radiation treatment.  Called pt with update.

## 2015-10-16 ENCOUNTER — Encounter: Payer: Self-pay | Admitting: *Deleted

## 2015-10-16 ENCOUNTER — Ambulatory Visit
Admission: RE | Admit: 2015-10-16 | Discharge: 2015-10-16 | Disposition: A | Discharge status: Home / Self Care | Payer: Medicare Other | Source: Ambulatory Visit | Attending: Radiation Oncology | Admitting: Radiation Oncology

## 2015-10-16 ENCOUNTER — Ambulatory Visit: Payer: Medicare Other

## 2015-10-16 DIAGNOSIS — Z510 Encounter for antineoplastic radiation therapy: Secondary | ICD-10-CM | POA: Diagnosis not present

## 2015-10-16 NOTE — Progress Notes (Signed)
.    Oncology Nurse Navigator Documentation  Navigator Location: [facility] On[facility] (10/16/15 1527) Navigator Encounter Type: Treatment (10/16/15 1527)           Patient Visit Type: QBHALP (10/16/15 1527) Treatment Phase: Active Tx (10/16/15 1527) Barriers/Navigation Needs: Transportation;Family concerns (10/16/15 1527) Education: Transport During Treatment;Concerns with Finances/ Eligibility;Housing/ Lodging During Treatment (10/16/15 1527) Interventions: Other (10/16/15 1527)   Saw patient in radiation oncology after her treatment today. Took #5 staff members to move her from table to stretcher. Patient has been calling for ambulance transport to her treatments. She can no longer go up/down any steps. Ambulance transports her upstairs after tx and picks her up from upstairs to go to tx. Concerned for her safety in her home if she can't get downstairs. Message to MD to inquire if she can be seen before 6/29 appointment to discuss her home situation/current status. Needs SNF or possibly Hospice in the home. CSW will follow up with transportation to see if the ambulance transport is covered by her insurance or not.                    Time Spent with Patient: 15 (10/16/15 1527)

## 2015-10-17 ENCOUNTER — Telehealth: Payer: Self-pay | Admitting: *Deleted

## 2015-10-17 ENCOUNTER — Ambulatory Visit
Admission: RE | Admit: 2015-10-17 | Discharge: 2015-10-17 | Disposition: A | Discharge status: Home / Self Care | Payer: Medicare Other | Source: Ambulatory Visit | Attending: Radiation Oncology | Admitting: Radiation Oncology

## 2015-10-17 ENCOUNTER — Ambulatory Visit: Payer: Medicare Other

## 2015-10-17 ENCOUNTER — Telehealth: Payer: Self-pay | Admitting: Oncology

## 2015-10-17 ENCOUNTER — Ambulatory Visit (HOSPITAL_BASED_OUTPATIENT_CLINIC_OR_DEPARTMENT_OTHER): Payer: Medicare Other | Admitting: Oncology

## 2015-10-17 ENCOUNTER — Encounter: Payer: Self-pay | Admitting: *Deleted

## 2015-10-17 ENCOUNTER — Other Ambulatory Visit: Payer: Self-pay | Admitting: *Deleted

## 2015-10-17 VITALS — BP 126/84 | HR 101 | Temp 99.1°F | Resp 18 | Ht 71.0 in

## 2015-10-17 DIAGNOSIS — C7802 Secondary malignant neoplasm of left lung: Secondary | ICD-10-CM

## 2015-10-17 DIAGNOSIS — C787 Secondary malignant neoplasm of liver and intrahepatic bile duct: Secondary | ICD-10-CM | POA: Diagnosis not present

## 2015-10-17 DIAGNOSIS — C187 Malignant neoplasm of sigmoid colon: Secondary | ICD-10-CM

## 2015-10-17 DIAGNOSIS — Z510 Encounter for antineoplastic radiation therapy: Secondary | ICD-10-CM | POA: Diagnosis not present

## 2015-10-17 DIAGNOSIS — C7951 Secondary malignant neoplasm of bone: Secondary | ICD-10-CM

## 2015-10-17 DIAGNOSIS — C7801 Secondary malignant neoplasm of right lung: Secondary | ICD-10-CM | POA: Diagnosis not present

## 2015-10-17 DIAGNOSIS — D509 Iron deficiency anemia, unspecified: Secondary | ICD-10-CM

## 2015-10-17 NOTE — Telephone Encounter (Signed)
spoke w/ pt confirmed added apt w/ MD today @ 2

## 2015-10-17 NOTE — Progress Notes (Signed)
[place] OFFICE PROGRESS NOTE   Diagnosis: Colon cancer  INTERVAL HISTORY:   [name] continues palliative radiation to the spine. She reports improvement in the left leg pain. She had mild recurrent discomfort at the left chest. Her chief complaint is weakness of the left leg and foot. This limits her ability to ambulate. No difficulty with bowel or bladder function. She stays in the upstairs of her apartment the entire day. Her family brings her food. The left foot brace does not fit in her shoe.  Objective:  Vital signs in last 24 hours:  Blood pressure 126/84, pulse 101, temperature 99.1 F (37.3 C), temperature source Oral, resp. rate 18, height _0  (1.803 m), SpO2 99 %.    HEENT: No thrush Resp: No respiratory distress, end inspiratory coarse rales at the left upper anterior chest, moves air bilaterally Cardio: Regular rate and rhythm GI: No hepatomegaly, nontender Vascular: No leg edema Neuro: 3+/5 strength throughout the left leg, she cannot dorsiflex the left foot, 3+/5 strength with plantar flexion    Portacath/PICC-without erythema  Lab Results:  Lab Results  Component Value Date   WBC 12.2* 10/02/2015   HGB 12.3 10/02/2015   HCT 37.1 10/02/2015   MCV 85.7 10/02/2015   PLT 185 10/02/2015   NEUTROABS 11.0* 09/30/2015    Medications: I have reviewed the patient's current medications.  Assessment/Plan: 1. Metastatic colon cancer (T4, N0, M1) adenocarcinoma of the sigmoid colon status post sigmoid colectomy and partial right hepatectomy 04/11/2012. Microsatellite stable. No loss of mismatch repair proteins. K-ras wild-type on the initial codon 12 and 13 testing. K-ras Q61L and APC mutations identified on extended testing. BRAF not mutated. No HER-2 amplification on Foundation 1 testing.  She completed cycle 1 CAPOX beginning 05/25/2012.   She completed cycle 8 CAPOX beginning 10/19/2012.   CEA on 10/19/2012 normal at 0.8.   CEA 6.1 on  01/11/2013   Restaging CT on 01/11/2013 consistent with multiple new pulmonary metastases   Cycle 1 of FOLFIRI/Avastin 01/18/2013; cycle 2 02/01/2013,cycle 3 02/16/2013.   CEA 5.0 on 03/01/2013.   Cycle 4 FOLFIRI/Avastin 03/01/2013.   Cycle 5 FOLFIRI/Avastin 03/15/2013.   Restaging chest CT 03/29/2013 with possible slight decrease in size of a left upper lobe metastasis. Other pulmonary nodules stable.   Cycle 6 FOLFIRI/Avastin 03/29/2013   Cycle 7 of FOLFIRI/Avastin 04/12/2013.   CEA 6.3 05/03/2013.   Cycle 8 FOLFIRI/Avastin 05/03/2013.   Cycle 9 FOLFIRI/Avastin 05/17/2013.   Cycle 10 FOLFIRI/Avastin 05/31/2013.   CEA 10.6 on 06/19/2013.   CT chest 06/19/2013 with mild progression of bilateral pulmonary nodules/metastases.   Decision made to continue FOLFIRI/Avastin. She completed cycle 11 06/21/2013.   Cycle 12 FOLFIRI/Avastin 07/05/2013   Cycle 13 FOLFIRI/Avastin 07/19/2013   Cycle 14 FOLFIRI/Avastin 08/01/2013.   CT chest 08/28/2013 with increased in size of bilateral lung nodules.   PET scan 09/22/2013 with multiple hypermetabolic pulmonary nodules bilaterally. Nodules demonstrate progressive enlargement compared with prior CTs. No evidence of extrathoracic metastatic disease.   Status post SRS 2 lung nodules, completed 10/18/2013  CT 01/12/2014 with airspace disease surrounding treated pulmonary nodules, no new nodules  01/12/2014 CEA 0.9  CT chest 04/12/2014 with improvement in airspace disease surrounding treated pulmonary nodules, no new nodules  04/12/2014 CEA 8.2  07/03/2014 CEA 60.3  CT 07/03/2014 with masslike enlargement of 2 treated lung lesions  Cycle 1 Lonsurf 07/12/2014.  Cycle 2 Lonsurf 08/11/2014 or 08/12/2014  Cycle 3 Lonsurf 09/08/2014  Cycle 4 Lonsurf 10/06/2014  Restaging  CT of the chest on 10/01/2014 with stable lung nodules  Cycle 5 Lonsurf 11/05/2014  Cycle 6 Lonsurf 12/03/2014  Restaging CTs of the  chest, abdomen, and pelvis on 12/26/2014 with no evidence of disease progression, stable lung nodules  Cycle 7 Lonsurf 12/31/2014  Cycle 8 Lonsurf 01/28/2015  Elevated CEA 03/12/2015  CT 03/19/2015 with enlargement of bilateral lung masses and progressive bone metastases including an enlarging lesion at T6 with replacement of the vertebral body  Cycle 1 FOLFOX/Avastin 03/25/2015  Cycle 2 FOLFOX/Avastin 04/08/2015  Cycle 3 FOLFOX/Avastin 04/23/2015  Cycle 4 FOLFOX/Avastin 05/08/2015  Cycle 5 FOLFOX/Avastin 05/22/2015  Restaging CT scans 06/10/2015 with decreased size of bilateral pulmonary metastases; stable hepatic steatosis and postoperative changes from partial hepatectomy; no definite liver metastases; mild increase in size of lytic bone metastasis in the right ilium; stable T6 vertebral body lytic metastasis  CT right femur 06/12/2015 with lesion in the right femur, status post placement of an intramedullary nail 06/15/2015  Cycle 1 Regorafenib beginning 08/02/2015 (prematurely discontinued)  Pathologic T6 compression fracture 08/19/2015 status post [facility] 08/26/2015  Cycle 2 Regorafenib beginning 08/29/2015 (dose reduced to 80 mg daily for 21 days followed by a 7 day break)  Cycle 3 Regorafenib beginning 09/26/2015 2. Microcytic anemia, iron deficiency. Improved.  3. Status post Port-A-Cath placement 03/14/2012. X-ray showed the Port-A-Cath tip to be in the azygos vein. The left-sided Port-A-Cath was removed and a new right Port-A-Cath was placed on 05/23/2012. 4. 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 Xarelto discontinued April 2017 due to rectal bleeding. 5. Hypertension-persistent on amlodipine. HCTZ added 12/26/2014 6. Delayed nausea following cycle 5 FOLFIRI/Avastin. Emend was added to the premedication regimen 7. Nausea secondary to Lonsurf-improved with Ativan 8. Mild oxaliplatin neuropathy 9. Right  groin pain. Question referred pain from the hip. 10. CT right femur 06/12/2015 with a partially visualized 4.3 x 2.3 cm lucent lesion in the inferior aspect of the right iliac bone superior to the right acetabulum. Associated destruction of the cortex of the bone; second lesion noted in the midportion of the right femur measuring 1.7 x 6.6 cm. Partial erosive changes of the cortex of the bone adjacent to the lesion.   Status post placement of an intramedullary nail right femur 06/15/2015.   She completed palliative radiation to the right femur and right pelvis 07/04/2015 through 07/19/2015. 11. Difficulty sleeping. She will try temazepam. 12. Anemia secondary to chronic disease, chemotherapy and rectal bleeding 13. Severe left leg pain-likely related to tumor involving the lumbosacral spine  Palliative radiation to lumbosacral spine initiated 10/03/15 14. 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 She is scheduled to complete radiation 10/21/2015. The pain has improved. She has persistent weakness in the left leg/foot that limits ambulation. We are concerned about her safety in the home. She would like to remain in the home. I recommend Hospice care. [name] could not make a decision regarding hospice today. She says she will decide by 10/21/2015. We discussed CODE STATUS. She has not made a decision on CODE STATUS. I will continue these discussions when she returns on 10/21/2015.  [name] understands systemic treatment options for the colon cancer are limited. I think she and her family would benefit from Hospice care.  Betsy Coder, MD  10/17/2015  4:08 PM

## 2015-10-17 NOTE — Progress Notes (Signed)
[place] Work  Clinical Social Work was referred by need for follow up due to concerns securing wheelchair delivery from [facility] and to check in with pt at radiation treatment. CSW learned from staff on [facility] 1 that pt has been coming to treatments via [facility]. CSW concerned as pt could have large bill for ambulance transport. CSW asked pt who arranged transportation via [facility] and she reports she did. Pt stated she "has to get her treatments". CSW spoke with [facility] staff that transported her today and they report pt can no longer get up and down the stairs in her home. Her bedroom and only bathroom are on the second floor. Pt was firm about returning home after last hospital staff regardless of this possible barrier. [facility] not able to transport pt wheelchair in ambulance and CSW arranged transport of wheelchair. All DME needs to be directly transported to pt's home from now on in order for proper education of equipment and safe delivery to occur.   CSW phone [facility] and pt did set up own transports for radiation. They report they have not "run it through her insurance to date" however transport one way is $350 then mileage on top of that. CSW will get MD to assist in completion of transport form in attempt to get this deemed medically necessary as pt cannot be safely transported via wheelchair. CSW to continue to follow closely and assist.     Clinical Social Work interventions:  Set designer education and referral  [name], [place] Worker [place]  [place] Phone: [phone] Fax: [phone]

## 2015-10-17 NOTE — Patient Instructions (Signed)
Please think about and consider [facility] to help you in your home. Work with physical therapy to get a brace that works with your shoes to help with ambulation Call for any increase in pain.

## 2015-10-17 NOTE — Progress Notes (Signed)
Call placed to patient to see if she is available to come in at 2:00PM today for visit with Dr. Benay Spice.  Patient states that she will be here at 2:00PM and will notify her transportation of appointment.

## 2015-10-17 NOTE — Telephone Encounter (Signed)
Patient was met in the hall with [facility] ambulance service, stating she was supposed to see Dr. Benay Spice first, checked appt times, she got here  At 310pm, her appt time with Dr. Learta Codding was at 2pm, our rad time was 245, I left vm on Dr. Gearldine Shown RN phone that we were treating patient now with her rad tx, to please call us 3:17 PM

## 2015-10-17 NOTE — Progress Notes (Signed)
Oncology Nurse Navigator Documentation  Oncology Nurse Navigator Flowsheets 10/17/2015  Navigator Location [facility]  Navigator Encounter Type Follow-up Appt  Telephone -  Patient Visit Type MedOnc  Treatment Phase Active Tx--palliative RT  Barriers/Navigation Needs Family concerns;Transportation--patient has been calling ambulance transport for appointments  Education Coping with Diagnosis/ Prognosis;Pain/ Symptom Management;Transport During Treatment;Concerns with Finances/ Eligibility  Interventions Other--obtained MD signature on form for EMS transport and placed in [place] mail box;Education Method  Referrals -  Coordination of Care -  Education Method [place] services and end of life care/needs  Acuity -  Time Spent with Patient 34  [name] is unable to negotiate the stairs in her home. Ambulance takes her upstairs to her bedroom and picks her up from there for appointments. She says she is independent in her bed mobility and able to get on and off BSC unassisted. She says on occasion she can use her walker to walk to bathroom. Physical therapy is in home working on her strength. She has significant foot drop of her left foot and has a brace that she can't get to fit into her shoes which is a huge barrier to ambulation. Will ask collaborative nurse to call physical therapy to discuss a brace she can wear outside her shoe if possible. [name] and nurse talked about Hospice and how they can help her stay in her home as long as possible and get the equipment she needs easily and help her and her family adjust to the change in her condition. This RN talked about her cancer not being cured. She became tearful and said "I don't want to die. Why can he heal everyone else and not me?". Allowed her to cry and vent her sadness about her condition. Attempted to help her remember that dying is part of the process of living and that not all healing occurs on earth. We want her alive and enjoying life  as long as possible and want her safe also. She was not willing to consent to Hospice at this time-wants to think about it. Will return to Dr. Benay Spice on the last day of her radiation treatment next week.

## 2015-10-18 ENCOUNTER — Encounter: Payer: Self-pay | Admitting: Radiation Oncology

## 2015-10-18 ENCOUNTER — Ambulatory Visit
Admission: RE | Admit: 2015-10-18 | Discharge: 2015-10-18 | Disposition: A | Discharge status: Home / Self Care | Payer: Medicare Other | Source: Ambulatory Visit | Attending: Radiation Oncology | Admitting: Radiation Oncology

## 2015-10-18 ENCOUNTER — Encounter: Payer: Self-pay | Admitting: *Deleted

## 2015-10-18 ENCOUNTER — Ambulatory Visit: Payer: Medicare Other

## 2015-10-18 DIAGNOSIS — Z510 Encounter for antineoplastic radiation therapy: Secondary | ICD-10-CM | POA: Diagnosis not present

## 2015-10-18 NOTE — Progress Notes (Signed)
[name]  Clinical Social [name] met with [name] briefly on 10/17/15 and wanted to attend [name] appt with Dr Benay Spice, but CSW had to lead a support group held at the same time. CSW submitted PCS Engineer, structural) form to [facility] in hopes of getting ambulance transport covered for radiation and other [place] appointments. [name] cannot navigate the stairs in [name] home at all and has to be brought down via stretcher. CSW has attempted to educate [name] several times about this issue and has also attempted to assist [name] with coping with severity of [name] diagnosis.   CSW phoned [name] today in attempt to revisit discussion help at [name] appointment yesterday. [name] not interested in accepting hospice services at this time. CSW provided education and also suggested referral to [facility] that could go out and meet with [name], assess needs, discuss hospice services and review [name]'s goals of care. [name] not open to that referral as well and really wants "to think about this, will let you know next week". CSW will continue to follow closely. CSW concerned for [name]'s safety at home, but [name] has right to currently make these decisions. CSW will continue to follow closely, provide emotional support and assist as appropriate.    Clinical Social [name] interventions: Resource education and referral Emotional support [name] advocacy  [name], [place] Worker [place]  [place] Phone: [phone] Fax: [phone]

## 2015-10-21 ENCOUNTER — Encounter: Payer: Self-pay | Admitting: Radiation Oncology

## 2015-10-21 ENCOUNTER — Ambulatory Visit
Admission: RE | Admit: 2015-10-21 | Discharge: 2015-10-21 | Disposition: A | Discharge status: Home / Self Care | Payer: Medicare Other | Source: Ambulatory Visit | Attending: Radiation Oncology | Admitting: Radiation Oncology

## 2015-10-21 ENCOUNTER — Ambulatory Visit: Payer: Medicare Other | Admitting: Oncology

## 2015-10-21 ENCOUNTER — Ambulatory Visit: Payer: Medicare Other

## 2015-10-21 DIAGNOSIS — C7951 Secondary malignant neoplasm of bone: Secondary | ICD-10-CM

## 2015-10-21 NOTE — Progress Notes (Signed)
Department of Radiation Oncology  Phone:  [phone] Fax:        [phone]  Weekly Treatment Note    Name: [name] Date: 10/21/2015 MRN: [id number] DOB: 1956-04-08   Diagnosis:   No diagnosis found.   Current dose: 30 Gy  Current fraction: 10   MEDICATIONS: Current Outpatient Prescriptions  Medication Sig Dispense Refill  . amLODipine (NORVASC) 10 MG tablet TAKE 1 TABLET BY MOUTH EVERY DAY (Patient taking differently: TAKE 10 MG BY MOUTH EVERY DAY) 90 tablet 0  . dexamethasone (DECADRON) 4 MG tablet Take 1 tablet (4 mg total) by mouth 2 (two) times daily. 90 tablet 0  . hydrochlorothiazide (MICROZIDE) 12.5 MG capsule Take 1 capsule (12.5 mg total) by mouth daily. 30 capsule 1  . HYDROmorphone (DILAUDID) 4 MG tablet TAKE 1-2 TABLET BY MOUTH EVERY 4 HOURS AS NEEDED FOR MODERATE OR SEVERE PAIN 75 tablet 0  . lidocaine-prilocaine (EMLA) cream Apply topically as needed. Apply to [facility] site 1-2 hours prior to stick and cover with plastic wrap to numb site (Patient taking differently: Apply 1 application topically daily as needed (port access). Apply to [facility] site 1-2 hours prior to stick and cover with plastic wrap to numb site) 30 g 3  . loperamide (IMODIUM) 2 MG capsule TAKE ONE CAPSULE BY MOUTH AS NEEDED FOR DIARRHEA OR LOOSE STOOLS. MAX OF 8 CAPSULES PER DAY 30 capsule 1  . LORazepam (ATIVAN) 0.5 MG tablet Place 1 tablet (0.5 mg total) under the tongue every 6 (six) hours as needed (for nausea). Causes drowsiness 30 tablet 0  . Multiple Vitamins-Minerals (MULTIVITAMIN) tablet Take 1 tablet by mouth daily.    . ondansetron (ZOFRAN) 8 MG tablet TAKE 1 TABLET BY MOUTH EVERY 8 HOURS AS NEEDED FOR NAUSEA AND VOMITING 20 tablet 2  . polyethylene glycol powder (MIRALAX) powder Take 17 g by mouth daily as needed. (Patient taking differently: Take 17 g by mouth daily as needed for mild constipation or moderate constipation. ) 255 g 0  . potassium chloride SA (K-DUR,KLOR-CON) 20 MEQ  tablet TAKE 1 TABLET BY MOUTH DAILY 90 tablet 1  . prochlorperazine (COMPAZINE) 10 MG tablet TAKE 1 TABLET BY MOUTH EVERY 6 HOURS AS NEEDED FOR NAUSEA OR VOMITING 30 tablet 1   No current facility-administered medications for this encounter.     ALLERGIES: Review of patient's allergies indicates no known allergies.   LABORATORY DATA:  Lab Results  Component Value Date   WBC 12.2* 10/02/2015   HGB 12.3 10/02/2015   HCT 37.1 10/02/2015   MCV 85.7 10/02/2015   PLT 185 10/02/2015   Lab Results  Component Value Date   NA 138 09/30/2015   K 4.2 09/30/2015   CL 104 09/30/2015   CO2 25 09/30/2015   Lab Results  Component Value Date   ALT 70* 09/20/2015   AST 33 09/20/2015   ALKPHOS 100 09/20/2015   BILITOT 0.46 09/20/2015     NARRATIVE: [name] was seen today for weekly treatment management. The chart was checked and the patient's films were reviewed.  The patient is finishing her final fraction of radiation treatment today. She denies any new complaints. She was somewhat tearful today. She is quite fatigued after all of her treatment.  PHYSICAL EXAMINATION: vitals were not taken for this visit.     Alert, no acute distress  ASSESSMENT: The patient is doing satisfactorily with treatment.  PLAN: We will continue with the patient's radiation treatment as planned.   ------------------------------------------------  Jodelle Gross, MD, PhD  This document serves as a record of services personally performed by Kyung Rudd, MD. It was created on his behalf by Derek Mound, a trained medical scribe. The creation of this record is based on the scribe's personal observations and the provider's statements to them. This document has been checked and approved by the attending provider.

## 2015-10-22 ENCOUNTER — Encounter: Payer: Self-pay | Admitting: *Deleted

## 2015-10-22 NOTE — Progress Notes (Signed)
  Radiation Oncology         [phone]) [phone] ________________________________  Name: [name] MRN: [id number]  Date: 08/26/2015  DOB: 24-Jun-1955  End of Treatment Note  Diagnosis:   Bony metastasis     Indication for treatment:  palliative       Radiation treatment dates:   72  Site/dose:    The T6 vertebral body metastasis was treated with a course of spinal radiosurgery to a dose of 18 gray in 1 fraction. 3 arcs/treatment fields were utilized for this treatment.  Narrative: The patient tolerated radiation treatment well.   There were no signs of acute toxicity after treatment.  Plan: The patient has completed radiation treatment. The patient will return to radiation oncology clinic for routine followup in one month. I advised the patient to call or return sooner if they have any questions or concerns related to their recovery or treatment. ________________________________  ------------------------------------------------  Jodelle Gross, MD, PhD

## 2015-10-22 NOTE — Progress Notes (Signed)
  Radiation Oncology         [phone]) [phone] ________________________________  Name: [name] MRN: [id number]  Date: 08/19/2015  DOB: 24-Sep-1955  SIMULATION AND TREATMENT PLANNING NOTE  DIAGNOSIS:     ICD-9-CM ICD-10-CM   1. Bone metastasis ([place]) 198.5 C79.51     Site:  Spine at the T6 level  NARRATIVE:  The patient was brought to the [place].  Identity was confirmed.  All relevant records and images related to the planned course of therapy were reviewed.   Written consent to proceed with treatment was confirmed which was freely given after reviewing the details related to the planned course of therapy had been reviewed with the patient.  Then, the patient was set-up in a stable reproducible supine position for radiation therapy.  CT images were obtained.  Surface markings were placed.    Medically necessary complex treatment device(s) for immobilization:  1.  customized body-fix device, 2.  customized accuform device.   The CT images were loaded into the planning software.  Then the target and avoidance structures were contoured.  Treatment planning then occurred.  The radiation prescription was entered and confirmed.   The patient will receive a course of stereotactic radiosurgery to the spine. I am therefore requesting a 3-D conformal technique. The target volume has been contoured in addition to the spinal cord and surrounding critical at risk organs.  Dose volume histograms of each of these structures will be carefully reviewed as part of the 3-D conformal technique.   PLAN:  The patient will receive 18 Gy in 1 fraction(s).  ________________________________   Jodelle Gross, MD, PhD

## 2015-10-22 NOTE — Addendum Note (Signed)
Encounter addended by: Kyung Rudd, MD on: 10/22/2015  3:06 PM<BR>     Documentation filed: Notes Section, Visit Diagnoses

## 2015-10-22 NOTE — Addendum Note (Signed)
Encounter addended by: Kyung Rudd, MD on: 10/22/2015  3:05 PM<BR>     Documentation filed: Notes Section, Visit Diagnoses

## 2015-10-22 NOTE — Progress Notes (Signed)
[place] [employer]  Holiday representative met with patient after radiation treatment to offer support and assess for needs.  Patient was tearful at times and expressed emotional stress associated with being unable to walk and "get around".  CSW provided additional emotional support and discussed support resources.  Patient was agreeable to a referral to [facility].  CSW consulted with physician and submitted referral.  Patient also requested information on the status of a "new boot".  CSW shared with medical team and will they will update patient with additional information.  CSW will continue to follow and support patient as needed.  Patient knows to call CSW with any needs or concerns.    Johnnye Lana, MSW, LCSW, OSW-C Clinical Social Worker [facility] [phone]

## 2015-10-22 NOTE — Addendum Note (Signed)
Encounter addended by: Kyung Rudd, MD on: 10/22/2015  3:10 PM<BR>     Documentation filed: Notes Section

## 2015-10-22 NOTE — Progress Notes (Signed)
  Radiation Oncology         [phone]) [phone] ________________________________  Name: [name] MRN: [id number]  Date: 08/26/2015  DOB: 24-May-1955   SPECIAL TREATMENT PROCEDURE   3D TREATMENT PLANNING AND DOSIMETRY: The patient's radiation plan was reviewed and approved by Dr. Annette Stable from neurosurgery and radiation oncology prior to treatment. It showed 3-dimensional radiation distributions overlaid onto the planning CT/MRI image set. The [facility] for the target structures as well as the organs at risk were reviewed. The documentation of the 3D plan and dosimetry are filed in the radiation oncology EMR.   NARRATIVE: The patient was brought to the TrueBeam stereotactic radiation treatment machine and placed supine on the CT couch. The patient was placed in the BodyFix bag to set up the patient for stereotactic radiosurgery and vacuum immobilization was applied. Neurosurgery was present for the set-up and delivery   SIMULATION VERIFICATION: In the couch zero-angle position, the patient underwent Exactrac imaging using the Brainlab system with orthogonal KV images. These were carefully aligned and repeated to confirm treatment position for each of the isocenters. The Exactrac snap film verification was repeated at each couch angle.   SPECIAL TREATMENT PROCEDURE: The patient received stereotactic radiosurgery to the following targets:  T6 target was treated using 3 Arcs to a prescription dose of 18 Gy. ExacTrac Snap verification was performed for each couch angle.   STEREOTACTIC TREATMENT MANAGEMENT: Following delivery, the patient was transported to nursing in stable condition and monitored for possible acute effects. Vital signs were recorded . The patient tolerated treatment without significant acute effects, and was discharged to home in stable condition.  PLAN: Follow-up in one month.   ------------------------------------------------  Jodelle Gross, MD, PhD

## 2015-10-23 ENCOUNTER — Encounter: Payer: Self-pay | Admitting: *Deleted

## 2015-10-23 NOTE — Progress Notes (Signed)
Order for transfer bench signed by Dr. Benay Spice and faxed back to [facility].  Fax transmission confirmed.

## 2015-10-23 NOTE — Progress Notes (Signed)
Orders signed by Dr. Benay Spice and faxed to [place] for PT/OT and home aide services.  Fax confirmation received.

## 2015-10-24 ENCOUNTER — Encounter (HOSPITAL_COMMUNITY): Payer: Self-pay | Admitting: Emergency Medicine

## 2015-10-24 ENCOUNTER — Telehealth: Payer: Self-pay | Admitting: *Deleted

## 2015-10-24 ENCOUNTER — Ambulatory Visit: Payer: Medicare Other | Admitting: Nurse Practitioner

## 2015-10-24 ENCOUNTER — Inpatient Hospital Stay (HOSPITAL_COMMUNITY)
Admission: EM | Admit: 2015-10-24 | Discharge: 2015-10-28 | DRG: 872 | Disposition: A | Discharge status: Hospice | Payer: Medicare Other | Attending: Internal Medicine | Admitting: Internal Medicine

## 2015-10-24 ENCOUNTER — Emergency Department (HOSPITAL_COMMUNITY): Payer: Medicare Other

## 2015-10-24 ENCOUNTER — Telehealth: Payer: Self-pay

## 2015-10-24 DIAGNOSIS — Z8249 Family history of ischemic heart disease and other diseases of the circulatory system: Secondary | ICD-10-CM

## 2015-10-24 DIAGNOSIS — Z7189 Other specified counseling: Secondary | ICD-10-CM | POA: Diagnosis not present

## 2015-10-24 DIAGNOSIS — Z923 Personal history of irradiation: Secondary | ICD-10-CM | POA: Diagnosis not present

## 2015-10-24 DIAGNOSIS — M79662 Pain in left lower leg: Secondary | ICD-10-CM | POA: Diagnosis not present

## 2015-10-24 DIAGNOSIS — R339 Retention of urine, unspecified: Secondary | ICD-10-CM | POA: Diagnosis present

## 2015-10-24 DIAGNOSIS — D61818 Other pancytopenia: Secondary | ICD-10-CM | POA: Diagnosis present

## 2015-10-24 DIAGNOSIS — M21372 Foot drop, left foot: Secondary | ICD-10-CM | POA: Diagnosis present

## 2015-10-24 DIAGNOSIS — R571 Hypovolemic shock: Secondary | ICD-10-CM | POA: Diagnosis not present

## 2015-10-24 DIAGNOSIS — K760 Fatty (change of) liver, not elsewhere classified: Secondary | ICD-10-CM | POA: Diagnosis present

## 2015-10-24 DIAGNOSIS — Z9049 Acquired absence of other specified parts of digestive tract: Secondary | ICD-10-CM

## 2015-10-24 DIAGNOSIS — L8990 Pressure ulcer of unspecified site, unspecified stage: Secondary | ICD-10-CM | POA: Insufficient documentation

## 2015-10-24 DIAGNOSIS — E860 Dehydration: Secondary | ICD-10-CM | POA: Diagnosis present

## 2015-10-24 DIAGNOSIS — I10 Essential (primary) hypertension: Secondary | ICD-10-CM | POA: Diagnosis present

## 2015-10-24 DIAGNOSIS — D638 Anemia in other chronic diseases classified elsewhere: Secondary | ICD-10-CM | POA: Diagnosis present

## 2015-10-24 DIAGNOSIS — M7989 Other specified soft tissue disorders: Secondary | ICD-10-CM | POA: Insufficient documentation

## 2015-10-24 DIAGNOSIS — R197 Diarrhea, unspecified: Secondary | ICD-10-CM | POA: Diagnosis present

## 2015-10-24 DIAGNOSIS — D6481 Anemia due to antineoplastic chemotherapy: Secondary | ICD-10-CM | POA: Diagnosis present

## 2015-10-24 DIAGNOSIS — R410 Disorientation, unspecified: Secondary | ICD-10-CM | POA: Diagnosis present

## 2015-10-24 DIAGNOSIS — Z803 Family history of malignant neoplasm of breast: Secondary | ICD-10-CM | POA: Diagnosis not present

## 2015-10-24 DIAGNOSIS — C187 Malignant neoplasm of sigmoid colon: Secondary | ICD-10-CM | POA: Diagnosis present

## 2015-10-24 DIAGNOSIS — C7802 Secondary malignant neoplasm of left lung: Secondary | ICD-10-CM | POA: Diagnosis present

## 2015-10-24 DIAGNOSIS — T451X5A Adverse effect of antineoplastic and immunosuppressive drugs, initial encounter: Secondary | ICD-10-CM | POA: Diagnosis present

## 2015-10-24 DIAGNOSIS — A419 Sepsis, unspecified organism: Principal | ICD-10-CM | POA: Diagnosis present

## 2015-10-24 DIAGNOSIS — R000 Tachycardia, unspecified: Secondary | ICD-10-CM | POA: Diagnosis not present

## 2015-10-24 DIAGNOSIS — D696 Thrombocytopenia, unspecified: Secondary | ICD-10-CM | POA: Diagnosis present

## 2015-10-24 DIAGNOSIS — I959 Hypotension, unspecified: Secondary | ICD-10-CM | POA: Diagnosis not present

## 2015-10-24 DIAGNOSIS — C7801 Secondary malignant neoplasm of right lung: Secondary | ICD-10-CM | POA: Diagnosis present

## 2015-10-24 DIAGNOSIS — I951 Orthostatic hypotension: Secondary | ICD-10-CM | POA: Diagnosis present

## 2015-10-24 DIAGNOSIS — R509 Fever, unspecified: Secondary | ICD-10-CM | POA: Diagnosis not present

## 2015-10-24 DIAGNOSIS — G544 Lumbosacral root disorders, not elsewhere classified: Secondary | ICD-10-CM | POA: Diagnosis present

## 2015-10-24 DIAGNOSIS — M79605 Pain in left leg: Secondary | ICD-10-CM | POA: Diagnosis present

## 2015-10-24 DIAGNOSIS — Z86718 Personal history of other venous thrombosis and embolism: Secondary | ICD-10-CM

## 2015-10-24 DIAGNOSIS — F419 Anxiety disorder, unspecified: Secondary | ICD-10-CM | POA: Diagnosis present

## 2015-10-24 DIAGNOSIS — D6959 Other secondary thrombocytopenia: Secondary | ICD-10-CM | POA: Diagnosis present

## 2015-10-24 DIAGNOSIS — D509 Iron deficiency anemia, unspecified: Secondary | ICD-10-CM | POA: Diagnosis present

## 2015-10-24 DIAGNOSIS — C7952 Secondary malignant neoplasm of bone marrow: Secondary | ICD-10-CM | POA: Diagnosis present

## 2015-10-24 DIAGNOSIS — Z515 Encounter for palliative care: Secondary | ICD-10-CM | POA: Insufficient documentation

## 2015-10-24 DIAGNOSIS — Z79899 Other long term (current) drug therapy: Secondary | ICD-10-CM

## 2015-10-24 DIAGNOSIS — C7951 Secondary malignant neoplasm of bone: Secondary | ICD-10-CM | POA: Diagnosis present

## 2015-10-24 DIAGNOSIS — E872 Acidosis: Secondary | ICD-10-CM | POA: Diagnosis present

## 2015-10-24 DIAGNOSIS — R110 Nausea: Secondary | ICD-10-CM | POA: Insufficient documentation

## 2015-10-24 DIAGNOSIS — C189 Malignant neoplasm of colon, unspecified: Secondary | ICD-10-CM | POA: Diagnosis not present

## 2015-10-24 DIAGNOSIS — Z7952 Long term (current) use of systemic steroids: Secondary | ICD-10-CM | POA: Diagnosis not present

## 2015-10-24 DIAGNOSIS — C787 Secondary malignant neoplasm of liver and intrahepatic bile duct: Secondary | ICD-10-CM | POA: Diagnosis present

## 2015-10-24 LAB — DIC (DISSEMINATED INTRAVASCULAR COAGULATION)PANEL
D-Dimer, Quant: 2.62 ug/mL-FEU — ABNORMAL HIGH (ref 0.00–0.50)
Fibrinogen: 445 mg/dL (ref 204–475)
INR: 1.22 (ref 0.00–1.49)
Prothrombin Time: 15.1 seconds (ref 11.6–15.2)
aPTT: 23 seconds — ABNORMAL LOW (ref 24–37)

## 2015-10-24 LAB — COMPREHENSIVE METABOLIC PANEL
ALBUMIN: 3 g/dL — AB (ref 3.5–5.0)
ALT: 72 U/L — ABNORMAL HIGH (ref 14–54)
ANION GAP: 10 (ref 5–15)
AST: 48 U/L — AB (ref 15–41)
Alkaline Phosphatase: 100 U/L (ref 38–126)
BILIRUBIN TOTAL: 1 mg/dL (ref 0.3–1.2)
BUN: 29 mg/dL — AB (ref 6–20)
CHLORIDE: 101 mmol/L (ref 101–111)
CO2: 24 mmol/L (ref 22–32)
Calcium: 9.2 mg/dL (ref 8.9–10.3)
Creatinine, Ser: 0.7 mg/dL (ref 0.44–1.00)
GFR calc Af Amer: >60 mL/min (ref >60)
GLUCOSE: 117 mg/dL — AB (ref 65–99)
POTASSIUM: 3.6 mmol/L (ref 3.5–5.1)
Sodium: 135 mmol/L (ref 135–145)
TOTAL PROTEIN: 5.6 g/dL — AB (ref 6.5–8.1)

## 2015-10-24 LAB — BLOOD GAS, VENOUS
ACID-BASE EXCESS: 1.6 mmol/L (ref 0.0–2.0)
BICARBONATE: 24.5 meq/L — AB (ref 20.0–24.0)
FIO2: 0.21
O2 SAT: 69.8 %
PATIENT TEMPERATURE: 98.6
PCO2 VEN: 34.4 mmHg — AB (ref 45.0–50.0)
PO2 VEN: 39.6 mmHg (ref 31.0–45.0)
TCO2: 22 mmol/L (ref 0–100)
pH, Ven: 7.467 — ABNORMAL HIGH (ref 7.250–7.300)

## 2015-10-24 LAB — I-STAT BETA HCG BLOOD, ED (MC, WL, AP ONLY)

## 2015-10-24 LAB — CBC WITH DIFFERENTIAL/PLATELET
BASOS ABS: 0 10*3/uL (ref 0.0–0.1)
BASOS PCT: 0 %
Eosinophils Absolute: 0 10*3/uL (ref 0.0–0.7)
Eosinophils Relative: 0 %
HEMATOCRIT: 36.3 % (ref 36.0–46.0)
Hemoglobin: 12 g/dL (ref 12.0–15.0)
LYMPHS PCT: 4 %
Lymphs Abs: 0.2 10*3/uL — ABNORMAL LOW (ref 0.7–4.0)
MCH: 28.2 pg (ref 26.0–34.0)
MCHC: 33.1 g/dL (ref 30.0–36.0)
MCV: 85.4 fL (ref 78.0–100.0)
Monocytes Absolute: 0.2 10*3/uL (ref 0.1–1.0)
Monocytes Relative: 4 %
NEUTROS ABS: 4.9 10*3/uL (ref 1.7–7.7)
NEUTROS PCT: 92 %
Platelets: 63 10*3/uL — ABNORMAL LOW (ref 150–400)
RBC: 4.25 MIL/uL (ref 3.87–5.11)
RDW: 18 % — ABNORMAL HIGH (ref 11.5–15.5)
WBC: 5.3 10*3/uL (ref 4.0–10.5)

## 2015-10-24 LAB — I-STAT CG4 LACTIC ACID, ED
LACTIC ACID, VENOUS: 2.47 mmol/L — AB (ref 0.5–1.9)
Lactic Acid, Venous: 2.08 mmol/L (ref 0.5–1.9)

## 2015-10-24 LAB — DIC (DISSEMINATED INTRAVASCULAR COAGULATION) PANEL
PLATELETS: 59 10*3/uL — AB (ref 150–400)
SMEAR REVIEW: NONE SEEN

## 2015-10-24 MED ORDER — DEXTROSE 5 % IV SOLN
2.0000 g | Freq: Once | INTRAVENOUS | Status: AC
  Administered 2015-10-24: 2 g via INTRAVENOUS
  Filled 2015-10-24: qty 2

## 2015-10-24 MED ORDER — ONDANSETRON HCL 4 MG PO TABS: 4.0000 mg | ORAL_TABLET | Freq: Four times a day (QID) | ORAL | Status: DC | PRN

## 2015-10-24 MED ORDER — VANCOMYCIN HCL IN DEXTROSE 1-5 GM/200ML-% IV SOLN
1000.0000 mg | Freq: Once | INTRAVENOUS | Status: AC
  Administered 2015-10-24: 1000 mg via INTRAVENOUS
  Filled 2015-10-24: qty 200

## 2015-10-24 MED ORDER — ACETAMINOPHEN 650 MG RE SUPP: 650.0000 mg | Freq: Four times a day (QID) | RECTAL | Status: DC | PRN

## 2015-10-24 MED ORDER — HYDROMORPHONE HCL 1 MG/ML IJ SOLN
1.0000 mg | INTRAMUSCULAR | Status: DC | PRN
  Administered 2015-10-25 – 2015-10-26 (×5): 1 mg via INTRAVENOUS
  Filled 2015-10-24 (×5): qty 1

## 2015-10-24 MED ORDER — PIPERACILLIN-TAZOBACTAM 3.375 G IVPB
3.3750 g | Freq: Once | INTRAVENOUS | Status: AC
  Administered 2015-10-25: 3.375 g via INTRAVENOUS
  Filled 2015-10-24: qty 50

## 2015-10-24 MED ORDER — DEXTROSE 5 % IV SOLN: 2.0000 g | Freq: Two times a day (BID) | INTRAVENOUS | Status: DC

## 2015-10-24 MED ORDER — SODIUM CHLORIDE 0.9 % IV BOLUS (SEPSIS): 1000.0000 mL | Freq: Once | INTRAVENOUS | Status: DC

## 2015-10-24 MED ORDER — SODIUM CHLORIDE 0.9% FLUSH
3.0000 mL | Freq: Two times a day (BID) | INTRAVENOUS | Status: DC
  Administered 2015-10-26 – 2015-10-28 (×6): 3 mL via INTRAVENOUS

## 2015-10-24 MED ORDER — VANCOMYCIN HCL IN DEXTROSE 1-5 GM/200ML-% IV SOLN
1000.0000 mg | INTRAVENOUS | Status: AC
  Administered 2015-10-24: 1000 mg via INTRAVENOUS
  Filled 2015-10-24: qty 200

## 2015-10-24 MED ORDER — SODIUM CHLORIDE 0.9 % IV BOLUS (SEPSIS)
1000.0000 mL | Freq: Once | INTRAVENOUS | Status: AC
  Administered 2015-10-24: 1000 mL via INTRAVENOUS

## 2015-10-24 MED ORDER — ALBUTEROL SULFATE (2.5 MG/3ML) 0.083% IN NEBU: 2.5000 mg | INHALATION_SOLUTION | RESPIRATORY_TRACT | Status: DC | PRN

## 2015-10-24 MED ORDER — DEXAMETHASONE 2 MG PO TABS
4.0000 mg | ORAL_TABLET | Freq: Two times a day (BID) | ORAL | Status: DC
  Administered 2015-10-25: 4 mg via ORAL
  Filled 2015-10-24: qty 2

## 2015-10-24 MED ORDER — FERROUS SULFATE 325 (65 FE) MG PO TABS
325.0000 mg | ORAL_TABLET | Freq: Every day | ORAL | Status: DC
  Administered 2015-10-25 – 2015-10-28 (×4): 325 mg via ORAL
  Filled 2015-10-24 (×4): qty 1

## 2015-10-24 MED ORDER — ACETAMINOPHEN 325 MG PO TABS
650.0000 mg | ORAL_TABLET | Freq: Four times a day (QID) | ORAL | Status: DC | PRN
  Administered 2015-10-25 – 2015-10-26 (×2): 650 mg via ORAL
  Filled 2015-10-24 (×2): qty 2

## 2015-10-24 MED ORDER — VANCOMYCIN HCL IN DEXTROSE 1-5 GM/200ML-% IV SOLN
1000.0000 mg | Freq: Two times a day (BID) | INTRAVENOUS | Status: DC
  Administered 2015-10-25 – 2015-10-26 (×3): 1000 mg via INTRAVENOUS
  Filled 2015-10-24 (×3): qty 200

## 2015-10-24 MED ORDER — SODIUM CHLORIDE 0.9 % IV BOLUS (SEPSIS)
500.0000 mL | Freq: Once | INTRAVENOUS | Status: AC
  Administered 2015-10-24: 500 mL via INTRAVENOUS

## 2015-10-24 MED ORDER — ONDANSETRON HCL 4 MG/2ML IJ SOLN
4.0000 mg | Freq: Four times a day (QID) | INTRAMUSCULAR | Status: DC | PRN
  Administered 2015-10-26: 4 mg via INTRAVENOUS
  Filled 2015-10-24: qty 2

## 2015-10-24 MED ORDER — ACETAMINOPHEN 325 MG PO TABS
650.0000 mg | ORAL_TABLET | Freq: Once | ORAL | Status: AC
  Administered 2015-10-24: 650 mg via ORAL
  Filled 2015-10-24: qty 2

## 2015-10-24 NOTE — ED Provider Notes (Signed)
CSN: [id number]     Arrival date & time 10/24/15  1711 History   First MD Initiated Contact with Patient 10/24/15 1822     Chief Complaint  Patient presents with  . Hypotension  . cancer pt      The history is provided by the patient and a relative. No language interpreter was used.   [name] is a 60 y.o. female who presents to the Emergency Department complaining of Nausea, malaise. She has a history of metastatic adenocarcinoma and is followed by Dr. Benay Spice with oncology. Level V caveat due to confusion. She reports ongoing pain in her left leg that is worsening. She is being evaluated for foot drop of the left lower extremity.  Past Medical History  Diagnosis Date  . Hypertension     pt states she stopped taking meds while in her 20's  . Blood in stool   . History of blood transfusion   . Anemia   . Eczema   . Heart murmur     does not see a cardiologist has been told she does has a murmur and also that she does not have a murmur  . Anxiety   . Neuromuscular disorder (HCC)     numbness feet  . Hx of radiation therapy 10/06/13,16th,19th,22nd,&10/18/13    SBRT lung  . Cancer ([place])   . Metastatic colon cancer to liver [facility])    Past Surgical History  Procedure Laterality Date  . Breast lumpectomy      left breast  . Colonoscopy  02/18/2012    Procedure: COLONOSCOPY;  Surgeon: Beryle Beams, MD;  Location: [place];  Service: Endoscopy;  Laterality: N/A;  . Portacath placement  03/14/2012    Procedure: INSERTION PORT-A-CATH;  Surgeon: Ralene Ok, MD;  Location: [place];  Service: General;  Laterality: N/A;  . Laparoscopic sigmoid colectomy  04/11/2012    Procedure: LAPAROSCOPIC SIGMOID COLECTOMY;  Surgeon: Stark Klein, MD;  Location: [place];  Service: General;;  converted to Open with splenic flexure take down  . Open partial hepatectomy [83]  04/11/2012    Procedure: OPEN PARTIAL HEPATECTOMY [83];  Surgeon: Stark Klein, MD;  Location: [place];  Service: General;   Laterality: Right;  . Laparoscopy  04/11/2012    Procedure: LAPAROSCOPY DIAGNOSTIC;  Surgeon: Stark Klein, MD;  Location: [place];  Service: General;  Laterality: N/A;  . Cholecystectomy  04/11/2012    Procedure: CHOLECYSTECTOMY;  Surgeon: Stark Klein, MD;  Location: [place];  Service: General;;  . Ileocecetomy  04/11/2012    Procedure: Pennie Rushing;  Surgeon: Stark Klein, MD;  Location: [place];  Service: General;  Laterality: N/A;  . Port a cath revision  05/23/2012    Procedure: PORT A CATH REVISION;  Surgeon: Ralene Ok, MD;  Location: [place];  Service: General;  Laterality: Right;  Port-A-Cath repositioning versus replacement  . Abdominal hysterectomy      early 62's  . Femur im nail Right 06/15/2015    Procedure: INTRAMEDULLARY (IM) NAIL INTERTROCHANTERIC ;  Surgeon: Mcarthur Rossetti, MD;  Location: [facility];  Service: Orthopedics;  Laterality: Right;   Family History  Problem Relation Age of Onset  . Hypertension Mother   . Cancer Mother 15    breast ca  . Heart disease Father    Social History  Substance Use Topics  . Smoking status: Never Smoker   . Smokeless tobacco: Never Used  . Alcohol Use: No   OB History    No data available  Review of Systems  All other systems reviewed and are negative.     Allergies  Review of patient's allergies indicates no known allergies.  Home Medications   Prior to Admission medications   Medication Sig Start Date End Date Taking? Authorizing Provider  amLODipine (NORVASC) 10 MG tablet TAKE 1 TABLET BY MOUTH EVERY DAY Patient taking differently: TAKE 10 MG BY MOUTH EVERY DAY 08/23/15  Yes Ladell Pier, MD  dexamethasone (DECADRON) 4 MG tablet Take 1 tablet (4 mg total) by mouth 2 (two) times daily. 10/04/15  Yes Ladell Pier, MD  ferrous sulfate 325 (65 FE) MG tablet Take 325 mg by mouth daily with breakfast.   Yes Historical Provider, MD  hydrochlorothiazide (MICROZIDE) 12.5 MG capsule Take 1 capsule (12.5 mg total) by  mouth daily. 12/26/14  Yes Ladell Pier, MD  HYDROmorphone (DILAUDID) 4 MG tablet TAKE 1-2 TABLET BY MOUTH EVERY 4 HOURS AS NEEDED FOR MODERATE OR SEVERE PAIN 10/04/15  Yes Ladell Pier, MD  lidocaine-prilocaine (EMLA) cream Apply topically as needed. Apply to [facility] site 1-2 hours prior to stick and cover with plastic wrap to numb site Patient taking differently: Apply 1 application topically daily as needed (port access). Apply to [facility] site 1-2 hours prior to stick and cover with plastic wrap to numb site 08/16/13  Yes Owens Shark, NP  Multiple Vitamins-Minerals (MULTIVITAMIN) tablet Take 1 tablet by mouth daily. 01/22/14  Yes Ladell Pier, MD  loperamide (IMODIUM) 2 MG capsule TAKE ONE CAPSULE BY MOUTH AS NEEDED FOR DIARRHEA OR LOOSE STOOLS. MAX OF 8 CAPSULES PER DAY Patient not taking: Reported on 10/24/2015 05/17/15   Ladell Pier, MD  LORazepam (ATIVAN) 0.5 MG tablet Place 1 tablet (0.5 mg total) under the tongue every 6 (six) hours as needed (for nausea). Causes drowsiness Patient not taking: Reported on 10/24/2015 05/08/15   Owens Shark, NP  ondansetron (ZOFRAN) 8 MG tablet TAKE 1 TABLET BY MOUTH EVERY 8 HOURS AS NEEDED FOR NAUSEA AND VOMITING Patient not taking: Reported on 10/24/2015 11/29/14   Ladell Pier, MD  polyethylene glycol powder (MIRALAX) powder Take 17 g by mouth daily as needed. Patient not taking: Reported on 10/24/2015 06/16/12   Owens Shark, NP  potassium chloride SA (K-DUR,KLOR-CON) 20 MEQ tablet TAKE 1 TABLET BY MOUTH DAILY Patient not taking: Reported on 10/24/2015 07/29/15   Ladell Pier, MD  prochlorperazine (COMPAZINE) 10 MG tablet TAKE 1 TABLET BY MOUTH EVERY 6 HOURS AS NEEDED FOR NAUSEA OR VOMITING Patient not taking: Reported on 10/24/2015 08/06/14   Owens Shark, NP   BP 101/56 mmHg  Pulse 119  Temp(Src) 100.3 F (37.9 C) (Oral)  Resp 21  Ht '5\' 8"'$  (1.727 m)  Wt 247 lb (112.038 kg)  BMI 37.56 kg/m2  SpO2 96% Physical Exam  Constitutional: She appears  well-developed.  Ill appearing  HENT:  Head: Normocephalic and atraumatic.  Cardiovascular: Regular rhythm.   No murmur heard. Tachycardic  Pulmonary/Chest: Effort normal. No respiratory distress.  Rhonchi bilaterally  Abdominal: Soft. There is no tenderness. There is no rebound and no guarding.  Musculoskeletal: She exhibits no edema or tenderness.  Neurological:  Drowsy but awake, generalized weakness  Skin: Skin is warm and dry.  Psychiatric:  Unable to assess  Nursing note and vitals reviewed.   ED Course  Procedures (including critical care time) Labs Review Labs Reviewed  COMPREHENSIVE METABOLIC PANEL - Abnormal; Notable for the following:    Glucose, Bld  117 (*)    BUN 29 (*)    Total Protein 5.6 (*)    Albumin 3.0 (*)    AST 48 (*)    ALT 72 (*)    All other components within normal limits  CBC WITH DIFFERENTIAL/PLATELET - Abnormal; Notable for the following:    RDW 18.0 (*)    Platelets 63 (*)    Lymphs Abs 0.2 (*)    All other components within normal limits  BLOOD GAS, VENOUS - Abnormal; Notable for the following:    pH, Ven 7.467 (*)    pCO2, Ven 34.4 (*)    Bicarbonate 24.5 (*)    All other components within normal limits  DIC (DISSEMINATED INTRAVASCULAR COAGULATION) PANEL - Abnormal; Notable for the following:    aPTT 23 (*)    D-Dimer, Quant 2.62 (*)    Platelets 59 (*)    All other components within normal limits  I-STAT CG4 LACTIC ACID, ED - Abnormal; Notable for the following:    Lactic Acid, Venous 2.47 (*)    All other components within normal limits  I-STAT CG4 LACTIC ACID, ED - Abnormal; Notable for the following:    Lactic Acid, Venous 2.08 (*)    All other components within normal limits  CULTURE, BLOOD (ROUTINE X 2)  MRSA PCR SCREENING  CULTURE, BLOOD (ROUTINE X 2)  URINE CULTURE  LACTIC ACID, PLASMA  PROCALCITONIN  URINALYSIS, ROUTINE W REFLEX MICROSCOPIC (NOT AT [facility])  LACTIC ACID, PLASMA  MAGNESIUM  PHOSPHORUS  TSH   COMPREHENSIVE METABOLIC PANEL  CBC  I-STAT BETA HCG BLOOD, ED ([facility], [facility], [facility] ONLY)  TYPE AND SCREEN    Imaging Review Dg Chest 2 View  10/24/2015  CLINICAL DATA:  Shortness of breath.  Metastatic colon carcinoma. EXAM: CHEST  2 VIEW COMPARISON:  Chest CT August 19, 2015 ; thoracic MRI August 20, 2015 FINDINGS: Extensive pulmonary metastatic disease bilaterally is again noted. Pulmonary nodular lesions range in size from as small as 5 mm to as large as 5 x 4.5 cm. There is no edema or consolidation. Heart size and pulmonary vascularity are normal. No adenopathy evident. Port-A-Cath tip is in the superior vena cava. No pneumothorax. There is collapse of the T6 vertebral body. IMPRESSION: Extensive pulmonary metastatic disease. No edema or consolidation. Cardiac silhouette within normal limits. Pathologic fracture of the T6 vertebral body appears grossly stable. Electronically Signed   By: Lowella Grip III M.D.   On: 10/24/2015 19:47   Ct Head Wo Contrast  10/24/2015  CLINICAL DATA:  Hypertension and weakness. History of metastatic colon cancer. EXAM: CT HEAD WITHOUT CONTRAST TECHNIQUE: Contiguous axial images were obtained from the base of the skull through the vertex without intravenous contrast. COMPARISON:  None. FINDINGS: Brain: No evidence of acute infarction, hemorrhage, extra-axial collection, ventriculomegaly, or mass effect. Mild brain parenchymal volume loss and deep white matter microvascular changes. Vascular: No hyperdense vessel or unexpected calcification. Skull: Negative for fracture or focal lesion. Sinuses/Orbits: No acute findings. Other: None. IMPRESSION: No acute intracranial abnormality. Mild brain parenchymal atrophy and chronic microvascular disease. Electronically Signed   By: Fidela Salisbury M.D.   On: 10/24/2015 19:21   I have personally reviewed and evaluated these images and lab results as part of my medical decision-making.   EKG Interpretation   Date/Time:   Thursday October 24 2015 20:23:33 EDT Ventricular Rate:  128 PR Interval:    QRS Duration: 81 QT Interval:  284 QTC Calculation: 415 R Axis:   97  Text Interpretation:  Sinus tachycardia Probable right ventricular  hypertrophy Confirmed by Hazle Coca [phone]) on 10/24/2015 8:34:35 PM      MDM   Final diagnoses:  Left leg swelling  Nausea    Patient with metastatic cancer here for malaise and feeling poorly. Due to illness and confusion she is able to only provide a limited history. History is also provided by the patient's son. On evaluation she is very ill-appearing and in considerable distress. She is noted to be febrile in the emergency department. Given her fever, tachycardia, hypertension prior to ED arrival concerning for potential sepsis, question infection from her port site. She was started on antibiotics. Concern for worsening respiratory status given her large pulmonary tumor burden so she was given a limited IV fluid resuscitation in the emergency department. Her blood pressure did improve prior to IV fluid resuscitation. Hospitalist was contacted for admission for further treatment.    Quintella Reichert, MD 10/25/15 1229

## 2015-10-24 NOTE — Progress Notes (Signed)
Pharmacy Antibiotic Note  [name] is a 60 y.o. female admitted on 10/24/2015 with sepsis.  Pharmacy has been consulted for Vanc/Zosyn dosing.  Plan: Vancomycin 2gm x1 then 1Gm IV q12h (VT=15-20 mg/L) Zosyn 3.375 Gm IV q8h EI  Height: '5\' 8"'$  (172.7 cm) Weight: 247 lb (112.038 kg) IBW/kg (Calculated) : 63.9  Temp (24hrs), Avg:99.3 F (37.4 C), Min:98.1 F (36.7 C), Max:102.1 F (38.9 C)   Recent Labs Lab 10/24/15 1853 10/24/15 1904 10/24/15 2111  WBC 5.3  --   --   CREATININE 0.70  --   --   LATICACIDVEN  --  2.47* 2.08*    Estimated Creatinine Clearance: 96.9 mL/min (by C-G formula based on Cr of 0.7).    No Known Allergies   Thank you for allowing pharmacy to be a part of this patient's care.    Dorrene German 10/24/2015 11:56 PM

## 2015-10-24 NOTE — ED Notes (Signed)
[place], MD made aware of patient lactic result.

## 2015-10-24 NOTE — Telephone Encounter (Signed)
Patient called to see if she could receive a hospital bed that MD Sherrill suggested during last visit. Patient prefers the mobile # for a call back. Message sent to RN Orest Dikes.

## 2015-10-24 NOTE — Progress Notes (Signed)
Pharmacy Antibiotic Note  [name] is a 60 y.o. female admitted on 10/24/2015 with sepsis.  Pharmacy has been consulted for Vanc/Cefepime dosing.  Plan: Vancomycin 2g IV x 1 then 1g IV every 12 hours.  Goal trough 15-20 mcg/mL. Cefepime 2g IV q12  Height: '5\' 8"'$  (172.7 cm) Weight: 247 lb (112.038 kg) IBW/kg (Calculated) : 63.9  Temp (24hrs), Avg:99.3 F (37.4 C), Min:98.1 F (36.7 C), Max:102.1 F (38.9 C)   Recent Labs Lab 10/24/15 1853 10/24/15 1904  WBC 5.3  --   CREATININE 0.70  --   LATICACIDVEN  --  2.47*    Estimated Creatinine Clearance: 96.9 mL/min (by C-G formula based on Cr of 0.7).    No Known Allergies   Thank you for allowing pharmacy to be a part of this patient's care.   Adrian Saran, PharmD, BCPS Pager [phone] 10/24/2015 7:59 PM

## 2015-10-24 NOTE — H&P (Signed)
[name]:[id number] DOB: Mar 30, 1956 DOA: 10/24/2015     PCP: Lynne Logan, MD   Outpatient Specialists: Oncology Benay Spice, red/onc Lisbeth Renshaw Patient coming from:   home Lives With family    Chief Complaint: Confusion  HPI: [name] is a 60 y.o. 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 The left leg has been hurting him more than usual and in swelling significantly. Regarding her cancer treatment she has been seen and 20 seconds of June oncology suite that point recommended hospice care but she was unsure at that time and attempts to have CODE STATUS discussion was made. Patient stated she still unsure. But now states she wants to be full code. She have had left leg swelling for past week.  Patient today cold EMS for left leg pain when EMS arrived she complained of diarrhea and abdominal pain was noted to be hypotensive when she tried to sit up on the stretcher but blood pressure improves when she laid down.   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 IN ER: Febrile up to 102.1 pulse up to 138 respirations 19 blood pressure on arrival some any 3/64 currently up to 113/78 oxygen saturation 99%  WBC 5.3  hemoglobin 12.0 platelets 63 which is new Creatinine 0.7 but BUN elevated to 29 glucose 117 lactic acid 2.47 CT scan of the head non-acute Chest x-ray showed extensive pulmonary metastatic disease but no edema or consolidation pathologic fracture of T6 vertebral body   Hospitalist was called for admission for SIRS  Review of Systems:    Pertinent positives include: abdominal pain, nausea, vomiting, diarrhea, left leg pain  Constitutional:  No weight loss, night sweats, Fevers, chills, fatigue, weight loss  HEENT:  No headaches, Difficulty swallowing,Tooth/dental problems,Sore throat,  No sneezing, itching, ear ache, nasal congestion, post nasal drip,  Cardio-vascular:  No chest pain, Orthopnea, PND, anasarca, dizziness, palpitations.no Bilateral lower extremity swelling  GI:  No heartburn, indigestion,  change in bowel habits, loss of appetite, melena, blood in stool, hematemesis Resp:  no shortness of breath at rest. No dyspnea on exertion, No excess mucus, no productive cough, No non-productive cough, No coughing up of blood.No change in color of mucus.No wheezing. Skin:  no rash or lesions. No jaundice GU:  no dysuria, change in color of urine, no urgency or frequency. No straining to urinate.  No flank pain.  Musculoskeletal:  No joint pain or no joint swelling. No decreased range of motion. No back pain.  Psych:  No change in mood or affect. No depression or anxiety. No memory loss.  Neuro: no localizing neurological complaints, no tingling, no weakness, no double vision, no gait abnormality, no slurred speech, no confusion  As per HPI otherwise 10 point review of systems negative.   Past Medical  History: Past Medical History  Diagnosis Date  . Hypertension     pt states she stopped taking meds while in her 20's  . Blood in stool   . History of blood transfusion   . Anemia   . Eczema   . Heart murmur     does not see a cardiologist has been told she does has a murmur  and also that she does not have a murmur  . Anxiety   . Neuromuscular disorder (HCC)     numbness feet  . Hx of radiation therapy 10/06/13,16th,19th,22nd,&10/18/13    SBRT lung  . Cancer ([place])   . Metastatic colon cancer to liver [facility])    Past Surgical History  Procedure Laterality Date  . Breast lumpectomy      left breast  . Colonoscopy  02/18/2012    Procedure: COLONOSCOPY;  Surgeon: Beryle Beams, MD;  Location: [place];  Service: Endoscopy;  Laterality: N/A;  . Portacath placement  03/14/2012    Procedure: INSERTION PORT-A-CATH;  Surgeon: Ralene Ok, MD;  Location: [place];  Service: General;  Laterality: N/A;  . Laparoscopic sigmoid colectomy  04/11/2012    Procedure: LAPAROSCOPIC SIGMOID COLECTOMY;  Surgeon: Stark Klein, MD;  Location: [place];  Service: General;;  converted to Open with splenic flexure take down  . Open partial hepatectomy [83]  04/11/2012    Procedure: OPEN PARTIAL HEPATECTOMY [83];  Surgeon: Stark Klein, MD;  Location: [place];  Service: General;  Laterality: Right;  . Laparoscopy  04/11/2012    Procedure: LAPAROSCOPY DIAGNOSTIC;  Surgeon: Stark Klein, MD;  Location: [place];  Service: General;  Laterality: N/A;  . Cholecystectomy  04/11/2012    Procedure: CHOLECYSTECTOMY;  Surgeon: Stark Klein, MD;  Location: [place];  Service: General;;  . Ileocecetomy  04/11/2012    Procedure: Pennie Rushing;  Surgeon: Stark Klein, MD;  Location: [place];  Service: General;  Laterality: N/A;  . Port a cath revision  05/23/2012    Procedure: PORT A CATH REVISION;  Surgeon: Ralene Ok, MD;  Location: [place];  Service: General;  Laterality: Right;  Port-A-Cath repositioning versus replacement  . Abdominal hysterectomy      early 2's  . Femur im nail Right 06/15/2015    Procedure: INTRAMEDULLARY (IM) NAIL INTERTROCHANTERIC ;  Surgeon: Mcarthur Rossetti, MD;  Location: [facility];  Service: Orthopedics;  Laterality: Right;     Social History:  Ambulatory  Gilford Rile      reports that she has never smoked. She has never used smokeless tobacco. She reports that she does not drink alcohol or use illicit drugs.  Allergies:  No Known Allergies     Family History:    Family History  Problem Relation Age of Onset  . Hypertension Mother   . Cancer Mother 35    breast ca  . Heart disease Father     Medications: Prior to Admission medications   Medication Sig Start Date End Date Taking? Authorizing Provider  amLODipine (NORVASC) 10 MG tablet TAKE 1 TABLET BY MOUTH EVERY DAY Patient taking differently: TAKE 10 MG BY MOUTH EVERY DAY 08/23/15  Yes Ladell Pier, MD  dexamethasone (DECADRON) 4 MG tablet Take 1 tablet (4 mg total) by mouth 2 (two) times daily. 10/04/15  Yes Ladell Pier, MD  ferrous sulfate 325 (65 FE) MG tablet Take 325 mg by mouth daily with breakfast.   Yes Historical Provider, MD  hydrochlorothiazide (MICROZIDE) 12.5 MG capsule Take 1 capsule (12.5 mg total) by  mouth daily. 12/26/14  Yes Ladell Pier, MD  HYDROmorphone (DILAUDID) 4 MG tablet TAKE 1-2 TABLET BY MOUTH EVERY 4 HOURS AS NEEDED FOR MODERATE OR SEVERE PAIN 10/04/15  Yes Ladell Pier, MD  lidocaine-prilocaine (EMLA) cream Apply topically as needed. Apply to [facility] site 1-2 hours prior to stick and cover with plastic wrap to numb site Patient taking differently: Apply 1 application topically daily as needed (port access). Apply to [facility] site 1-2 hours prior to stick and cover with plastic wrap to numb site 08/16/13  Yes Owens Shark, NP  Multiple Vitamins-Minerals (MULTIVITAMIN) tablet Take 1 tablet by mouth daily. 01/22/14  Yes Ladell Pier, MD  loperamide (IMODIUM) 2 MG capsule TAKE ONE CAPSULE BY MOUTH AS NEEDED FOR DIARRHEA OR LOOSE STOOLS. MAX OF 8 CAPSULES PER DAY Patient not taking: Reported on 10/24/2015 05/17/15   Ladell Pier, MD  LORazepam (ATIVAN) 0.5 MG tablet Place 1 tablet (0.5 mg total) under the tongue every 6 (six) hours as needed (for nausea). Causes  drowsiness Patient not taking: Reported on 10/24/2015 05/08/15   Owens Shark, NP  ondansetron (ZOFRAN) 8 MG tablet TAKE 1 TABLET BY MOUTH EVERY 8 HOURS AS NEEDED FOR NAUSEA AND VOMITING Patient not taking: Reported on 10/24/2015 11/29/14   Ladell Pier, MD  polyethylene glycol powder (MIRALAX) powder Take 17 g by mouth daily as needed. Patient not taking: Reported on 10/24/2015 06/16/12   Owens Shark, NP  potassium chloride SA (K-DUR,KLOR-CON) 20 MEQ tablet TAKE 1 TABLET BY MOUTH DAILY Patient not taking: Reported on 10/24/2015 07/29/15   Ladell Pier, MD  prochlorperazine (COMPAZINE) 10 MG tablet TAKE 1 TABLET BY MOUTH EVERY 6 HOURS AS NEEDED FOR NAUSEA OR VOMITING Patient not taking: Reported on 10/24/2015 08/06/14   Owens Shark, NP    Physical Exam: Patient Vitals for the past 24 hrs:  BP Temp Temp src Pulse Resp SpO2 Height Weight  10/24/15 1936 113/78 mmHg 102.1 F (38.9 C) Rectal (!) 121 19 99 % - -  10/24/15 1857 133/79 mmHg 98.1 F (36.7 C) Oral (!) 127 16 95 % - -  10/24/15 1817 118/84 mmHg 98.6 F (37 C) Oral (!) 130 18 95 % - -  10/24/15 1740 - 98.4 F (36.9 C) Oral - - - - -  10/24/15 1721 (!) 73/64 mmHg - - (!) 138 18 96 % '5\' 8"'$  (1.727 m) 112.038 kg (247 lb)    1. General:  in No Acute distress 2. Psychological: lethargic  but   Oriented 3. Head/ENT:     Dry Mucous Membranes                          Head Non traumatic, neck supple                           Poor Dentition 4. SKIN:  decreased Skin turgor,  Skin clean Dry and intact no rash 5. Heart: Regular rate and rhythm  Noted Murmur, Rub or gallop 6. Lungs:   no wheezes some crackles   7. Abdomen: Soft, non-tender, Non distended 8. Lower extremities: no clubbing, cyanosis, left worse than right edema 9. Neurologically Grossly intact, moving all 4 extremities equally 10. MSK: Normal range of motion   body mass index is 37.56 kg/(m^2).  Labs on Admission:   Labs on Admission: I have personally reviewed  following labs and imaging studies  CBC:  Recent Labs Lab 10/24/15 1853  WBC 5.3  NEUTROABS 4.9  HGB 12.0  HCT 36.3  MCV 85.4  PLT 63*   Basic Metabolic Panel:  Recent Labs Lab 10/24/15 1853  NA 135  K 3.6  CL 101  CO2 24  GLUCOSE 117*  BUN 29*  CREATININE 0.70  CALCIUM 9.2   GFR: Estimated Creatinine Clearance: 96.9 mL/min (by C-G formula based on Cr of 0.7). Liver Function Tests:  Recent Labs Lab 10/24/15 1853  AST 48*  ALT 72*  ALKPHOS 100  BILITOT 1.0  PROT 5.6*  ALBUMIN 3.0*   No results for input(s): LIPASE, AMYLASE in the last 168 hours. No results for input(s): AMMONIA in the last 168 hours. Coagulation Profile: No results for input(s): INR, PROTIME in the last 168 hours. Cardiac Enzymes: No results for input(s): CKTOTAL, CKMB, CKMBINDEX, TROPONINI in the last 168 hours. BNP (last 3 results) No results for input(s): PROBNP in the last 8760 hours. HbA1C: No results for input(s): HGBA1C in the last 72 hours. CBG: No results for input(s): GLUCAP in the last 168 hours. Lipid Profile: No results for input(s): CHOL, HDL, LDLCALC, TRIG, CHOLHDL, LDLDIRECT in the last 72 hours. Thyroid Function Tests: No results for input(s): TSH, T4TOTAL, FREET4, T3FREE, THYROIDAB in the last 72 hours. Anemia Panel: No results for input(s): VITAMINB12, FOLATE, FERRITIN, TIBC, IRON, RETICCTPCT in the last 72 hours. Urine analysis:    Component Value Date/Time   COLORURINE YELLOW 08/04/2015 0054   APPEARANCEUR CLOUDY* 08/04/2015 0054   LABSPEC 1.025 08/04/2015 0054   PHURINE 5.5 08/04/2015 0054   GLUCOSEU NEGATIVE 08/04/2015 0054   GLUCOSEU NEG mg/dL 06/01/2006 2232   HGBUR NEGATIVE 08/04/2015 0054   BILIRUBINUR NEGATIVE 08/04/2015 0054   KETONESUR NEGATIVE 08/04/2015 0054   PROTEINUR NEGATIVE 08/04/2015 0054   UROBILINOGEN 0.2 04/07/2012 0948   NITRITE NEGATIVE 08/04/2015 0054   LEUKOCYTESUR NEGATIVE 08/04/2015 0054   Sepsis  Labs: '@LABRCNTIP'$ (procalcitonin:4,lacticidven:4) )No results found for this or any previous visit (from the past 240 hour(s)).     UA ordered  Lab Results  Component Value Date   HGBA1C 5.9* 02/16/2012    Estimated Creatinine Clearance: 96.9 mL/min (by C-G formula based on Cr of 0.7).  BNP (last 3 results) No results for input(s): PROBNP in the last 8760 hours.   ECG REPORT  Independently reviewed Rate:124  Rhythm: Sinus tachycardia ST&T Change: No acute ischemic changes   QTC 460  Filed Weights   10/24/15 1721  Weight: 112.038 kg (247 lb)     Cultures:    Component Value Date/Time   SDES BLOOD RIGHT ARM 02/17/2012 1034   SPECREQUEST BOTTLES DRAWN AEROBIC AND ANAEROBIC 10CC 02/17/2012 1034   CULT  02/17/2012 1034    ESCHERICHIA COLI Note: Gram Stain Report Called to,Read Back By and Verified With: AERIELLE MOHAMMED '@0545'$  ON 02/18/2012 BY MCLET   REPTSTATUS 02/20/2012 FINAL 02/17/2012 1034     Radiological Exams on Admission: Dg Chest 2 View  10/24/2015  CLINICAL DATA:  Shortness of breath.  Metastatic colon carcinoma. EXAM: CHEST  2 VIEW COMPARISON:  Chest CT August 19, 2015 ; thoracic MRI August 20, 2015 FINDINGS: Extensive pulmonary metastatic disease bilaterally is again noted. Pulmonary nodular lesions range in size from as small as 5 mm to as large as 5 x 4.5 cm. There is no edema or consolidation. Heart size and pulmonary vascularity are normal. No adenopathy evident. Port-A-Cath tip is in the superior vena cava. No pneumothorax. There is collapse of  the T6 vertebral body. IMPRESSION: Extensive pulmonary metastatic disease. No edema or consolidation. Cardiac silhouette within normal limits. Pathologic fracture of the T6 vertebral body appears grossly stable. Electronically Signed   By: Lowella Grip III M.D.   On: 10/24/2015 19:47   Ct Head Wo Contrast  10/24/2015  CLINICAL DATA:  Hypertension and weakness. History of metastatic colon cancer. EXAM: CT HEAD WITHOUT  CONTRAST TECHNIQUE: Contiguous axial images were obtained from the base of the skull through the vertex without intravenous contrast. COMPARISON:  None. FINDINGS: Brain: No evidence of acute infarction, hemorrhage, extra-axial collection, ventriculomegaly, or mass effect. Mild brain parenchymal volume loss and deep white matter microvascular changes. Vascular: No hyperdense vessel or unexpected calcification. Skull: Negative for fracture or focal lesion. Sinuses/Orbits: No acute findings. Other: None. IMPRESSION: No acute intracranial abnormality. Mild brain parenchymal atrophy and chronic microvascular disease. Electronically Signed   By: Fidela Salisbury M.D.   On: 10/24/2015 19:21    Chart has been reviewed    Assessment/Plan  60 y.o. 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 Sepsis ([place]) -  Admit per Sepsis protocol likely source being unclear   - rehydrate with 49m/kg  - initiate broad spectrum antibiotics  Vancomycin and Zosyn  -  obtain blood cultures  - Obtain serial lactic acid  - Obtain procalcitonin level  - Admit and monitor vital signs closely  -  PCCM has been consulted  Sepsis - Repeat Assessment  Performed at:    21:40pm  Vitals     Blood pressure 100/78, pulse 119, temperature 98.3 F (36.8 C), temperature source Oral, resp. rate 18, height '5\' 8"'$  (1.727 m), weight 112.038 kg (247 lb), SpO2 96 %.  Heart:     Tachycardic  Lungs:    Rhonchi  Capillary Refill:   <2 sec  Peripheral Pulse:   Radial pulse palpable  Skin:     Normal Color    . 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 Bone metastasis ([place] - pain management  .  Essential hypertension hold blood pressure medications given hypotension   . [place]ocyt[place] ([place] - possibly secondary to sepsis but cannot rule out large DVT. Left leg swelling and will obtain Dopplers of hold off on empiric Lovenox given thrombocytopenia Other plan as per orders.  DVT prophylaxis:  SCD   Code Status:  FULL CODE  as per patient   Family Communication:   Family at  Bedside  plan of care was discussed with  Son POrella [phone]  Disposition Plan:    To home once workup is complete and patient is stable   Consults called: PCCM  Admission status:    inpatient      Level of care       SDU     DBerry Creek6/29/2017, 10:42 PM    [facility]  Pager [phone]  after 2 AM please page floor coverage PA If 7AM-7PM, please contact the day team taking care of the patient  [email or web address]  Password [email or web address]

## 2015-10-24 NOTE — ED Notes (Signed)
Pt initially called EMS for foot pain. Upon moving pt to EMS stretcher pt began to complain of feeling of diarrhea and abd pain. Pt hypotensive sitting up in stretcher upon arrival. 100/67 when lying back. Hx of colon ca with lung mets. Last chemo Monday.

## 2015-10-24 NOTE — Telephone Encounter (Signed)
Call placed to check on patient's status regarding hospital bed.  Pt states that her left leg and foot are cold and tight and feel as they are going "to pop".  She also states that she is having increased nausea.  Dr. Benay Spice notified of patient's condition and order received to instruct pt to go to the ER via EMS.  Pt informed of MD instructions and will call EMS to come to [facility] ER.

## 2015-10-24 NOTE — Telephone Encounter (Signed)
Called patient to see if she was coming to her appt today, states she does not feel up to it, her stomach is bothering her today. Informed her scheduling will contact her with a new appt time. Pt verbalized understanding and knows to call interm with any problems

## 2015-10-25 ENCOUNTER — Inpatient Hospital Stay (HOSPITAL_COMMUNITY): Payer: Medicare Other

## 2015-10-25 ENCOUNTER — Encounter: Payer: Self-pay | Admitting: *Deleted

## 2015-10-25 ENCOUNTER — Telehealth: Payer: Self-pay | Admitting: Oncology

## 2015-10-25 ENCOUNTER — Telehealth: Payer: Self-pay | Admitting: *Deleted

## 2015-10-25 DIAGNOSIS — D6481 Anemia due to antineoplastic chemotherapy: Secondary | ICD-10-CM

## 2015-10-25 DIAGNOSIS — C189 Malignant neoplasm of colon, unspecified: Secondary | ICD-10-CM

## 2015-10-25 DIAGNOSIS — C7801 Secondary malignant neoplasm of right lung: Secondary | ICD-10-CM

## 2015-10-25 DIAGNOSIS — R110 Nausea: Secondary | ICD-10-CM

## 2015-10-25 DIAGNOSIS — D696 Thrombocytopenia, unspecified: Secondary | ICD-10-CM

## 2015-10-25 DIAGNOSIS — E860 Dehydration: Secondary | ICD-10-CM

## 2015-10-25 DIAGNOSIS — M7989 Other specified soft tissue disorders: Secondary | ICD-10-CM

## 2015-10-25 DIAGNOSIS — R509 Fever, unspecified: Secondary | ICD-10-CM

## 2015-10-25 DIAGNOSIS — I10 Essential (primary) hypertension: Secondary | ICD-10-CM

## 2015-10-25 DIAGNOSIS — R571 Hypovolemic shock: Secondary | ICD-10-CM

## 2015-10-25 DIAGNOSIS — R000 Tachycardia, unspecified: Secondary | ICD-10-CM

## 2015-10-25 DIAGNOSIS — I959 Hypotension, unspecified: Secondary | ICD-10-CM

## 2015-10-25 DIAGNOSIS — R197 Diarrhea, unspecified: Secondary | ICD-10-CM

## 2015-10-25 DIAGNOSIS — Z7189 Other specified counseling: Secondary | ICD-10-CM

## 2015-10-25 DIAGNOSIS — M79662 Pain in left lower leg: Secondary | ICD-10-CM

## 2015-10-25 DIAGNOSIS — D500 Iron deficiency anemia secondary to blood loss (chronic): Secondary | ICD-10-CM

## 2015-10-25 DIAGNOSIS — L8990 Pressure ulcer of unspecified site, unspecified stage: Secondary | ICD-10-CM | POA: Insufficient documentation

## 2015-10-25 DIAGNOSIS — Z515 Encounter for palliative care: Secondary | ICD-10-CM | POA: Insufficient documentation

## 2015-10-25 DIAGNOSIS — C7951 Secondary malignant neoplasm of bone: Secondary | ICD-10-CM

## 2015-10-25 DIAGNOSIS — A419 Sepsis, unspecified organism: Principal | ICD-10-CM

## 2015-10-25 DIAGNOSIS — C7802 Secondary malignant neoplasm of left lung: Secondary | ICD-10-CM

## 2015-10-25 DIAGNOSIS — C187 Malignant neoplasm of sigmoid colon: Secondary | ICD-10-CM

## 2015-10-25 DIAGNOSIS — C787 Secondary malignant neoplasm of liver and intrahepatic bile duct: Secondary | ICD-10-CM

## 2015-10-25 LAB — CBC
HEMATOCRIT: 31.6 % — AB (ref 36.0–46.0)
HEMOGLOBIN: 10.7 g/dL — AB (ref 12.0–15.0)
MCH: 29.1 pg (ref 26.0–34.0)
MCHC: 33.9 g/dL (ref 30.0–36.0)
MCV: 85.9 fL (ref 78.0–100.0)
Platelets: 50 10*3/uL — ABNORMAL LOW (ref 150–400)
RBC: 3.68 MIL/uL — AB (ref 3.87–5.11)
RDW: 18.2 % — AB (ref 11.5–15.5)
WBC: 4.6 10*3/uL (ref 4.0–10.5)

## 2015-10-25 LAB — PHOSPHORUS: PHOSPHORUS: 3.4 mg/dL (ref 2.5–4.6)

## 2015-10-25 LAB — COMPREHENSIVE METABOLIC PANEL
ALT: 58 U/L — ABNORMAL HIGH (ref 14–54)
ANION GAP: 6 (ref 5–15)
AST: 44 U/L — ABNORMAL HIGH (ref 15–41)
Albumin: 2.6 g/dL — ABNORMAL LOW (ref 3.5–5.0)
Alkaline Phosphatase: 83 U/L (ref 38–126)
BILIRUBIN TOTAL: 1.3 mg/dL — AB (ref 0.3–1.2)
BUN: 25 mg/dL — AB (ref 6–20)
CO2: 23 mmol/L (ref 22–32)
Calcium: 8.1 mg/dL — ABNORMAL LOW (ref 8.9–10.3)
Chloride: 107 mmol/L (ref 101–111)
Creatinine, Ser: 0.61 mg/dL (ref 0.44–1.00)
Glucose, Bld: 120 mg/dL — ABNORMAL HIGH (ref 65–99)
POTASSIUM: 3.3 mmol/L — AB (ref 3.5–5.1)
Sodium: 136 mmol/L (ref 135–145)
TOTAL PROTEIN: 4.7 g/dL — AB (ref 6.5–8.1)

## 2015-10-25 LAB — URINALYSIS, ROUTINE W REFLEX MICROSCOPIC
BILIRUBIN URINE: NEGATIVE
Glucose, UA: NEGATIVE mg/dL
HGB URINE DIPSTICK: NEGATIVE
Ketones, ur: NEGATIVE mg/dL
Leukocytes, UA: NEGATIVE
NITRITE: NEGATIVE
PH: 5 (ref 5.0–8.0)
Protein, ur: NEGATIVE mg/dL
SPECIFIC GRAVITY, URINE: 1.029 (ref 1.005–1.030)

## 2015-10-25 LAB — MAGNESIUM: MAGNESIUM: 1.7 mg/dL (ref 1.7–2.4)

## 2015-10-25 LAB — TYPE AND SCREEN
ABO/RH(D): A POS
ANTIBODY SCREEN: NEGATIVE

## 2015-10-25 LAB — PROCALCITONIN
PROCALCITONIN: 0.32 ng/mL
Procalcitonin: 0.35 ng/mL

## 2015-10-25 LAB — TSH: TSH: 0.874 u[IU]/mL (ref 0.350–4.500)

## 2015-10-25 LAB — LACTIC ACID, PLASMA
LACTIC ACID, VENOUS: 1.4 mmol/L (ref 0.5–1.9)
LACTIC ACID, VENOUS: 1.5 mmol/L (ref 0.5–1.9)

## 2015-10-25 LAB — MRSA PCR SCREENING: MRSA by PCR: NEGATIVE

## 2015-10-25 MED ORDER — HYDROCORTISONE NA SUCCINATE PF 100 MG IJ SOLR
100.0000 mg | Freq: Three times a day (TID) | INTRAMUSCULAR | Status: DC
  Administered 2015-10-25 – 2015-10-26 (×3): 100 mg via INTRAVENOUS
  Filled 2015-10-25 (×3): qty 2

## 2015-10-25 MED ORDER — HYDROCORTISONE NA SUCCINATE PF 100 MG IJ SOLR
50.0000 mg | Freq: Four times a day (QID) | INTRAMUSCULAR | Status: DC
  Administered 2015-10-25: 50 mg via INTRAVENOUS
  Filled 2015-10-25: qty 2

## 2015-10-25 MED ORDER — FAMOTIDINE IN NACL 20-0.9 MG/50ML-% IV SOLN
20.0000 mg | Freq: Two times a day (BID) | INTRAVENOUS | Status: DC
  Administered 2015-10-25 – 2015-10-26 (×3): 20 mg via INTRAVENOUS
  Filled 2015-10-25 (×3): qty 50

## 2015-10-25 MED ORDER — MORPHINE SULFATE ER 30 MG PO TBCR
30.0000 mg | EXTENDED_RELEASE_TABLET | Freq: Two times a day (BID) | ORAL | Status: DC
  Administered 2015-10-25 (×2): 30 mg via ORAL
  Filled 2015-10-25 (×2): qty 1

## 2015-10-25 MED ORDER — HYDROMORPHONE HCL 2 MG PO TABS: 4.0000 mg | ORAL_TABLET | ORAL | Status: DC | PRN

## 2015-10-25 MED ORDER — PIPERACILLIN-TAZOBACTAM 3.375 G IVPB
3.3750 g | Freq: Three times a day (TID) | INTRAVENOUS | Status: DC
  Administered 2015-10-25 – 2015-10-26 (×4): 3.375 g via INTRAVENOUS
  Filled 2015-10-25 (×4): qty 50

## 2015-10-25 MED ORDER — GABAPENTIN 100 MG PO CAPS
100.0000 mg | ORAL_CAPSULE | Freq: Three times a day (TID) | ORAL | Status: DC
  Administered 2015-10-25 – 2015-10-28 (×10): 100 mg via ORAL
  Filled 2015-10-25 (×10): qty 1

## 2015-10-25 MED ORDER — DEXAMETHASONE 2 MG PO TABS: 4.0000 mg | ORAL_TABLET | Freq: Four times a day (QID) | ORAL | Status: DC

## 2015-10-25 NOTE — Consult Note (Signed)
PULMONARY / CRITICAL CARE MEDICINE   Name: [name] MRN: [id number] DOB: Jan 06, 1956    ADMISSION DATE:  10/24/2015 CONSULTATION DATE:  10/25/2015  REFERRING MD:  Dr. Marvis Repress, TRH  CHIEF COMPLAINT:  Hypotension, N/V.  HISTORY OF PRESENT ILLNESS:   60 year old female with PMH of colon cancer with mets to lungs, liver and bone who underwent palliative RT and oncology recommends hospice care at this point but patient is unsure and requested full code status.  Patient started complaining for feeling generally unwell on 6/29 and started becoming confused.  EMS was called and patient was found to be orthostatic by EMS.  Was confused earlier on the admission.  PAST MEDICAL HISTORY :  She  has a past medical history of Hypertension; Blood in stool; History of blood transfusion; Anemia; Eczema; Heart murmur; Anxiety; Neuromuscular disorder ([place]); radiation therapy (10/06/13,16th,19th,22nd,&10/18/13); Cancer [facility]); and Metastatic colon cancer to liver [facility]).  PAST SURGICAL HISTORY: She  has past surgical history that includes Breast lumpectomy; Colonoscopy (02/18/2012); Portacath placement (03/14/2012); Laparoscopic sigmoid colectomy (04/11/2012); Open partial hepatectomy [83] (04/11/2012); laparoscopy (04/11/2012); Cholecystectomy (04/11/2012); Ileocecetomy (04/11/2012); Port a cath revision (05/23/2012); Abdominal hysterectomy; and Femur IM nail (Right, 06/15/2015).  No Known Allergies  No current facility-administered medications on file prior to encounter.   Current Outpatient Prescriptions on File Prior to Encounter  Medication Sig  . amLODipine (NORVASC) 10 MG tablet TAKE 1 TABLET BY MOUTH EVERY DAY (Patient taking differently: TAKE 10 MG BY MOUTH EVERY DAY)  . dexamethasone (DECADRON) 4 MG tablet Take 1 tablet (4 mg total) by mouth 2 (two) times daily.  . hydrochlorothiazide (MICROZIDE) 12.5 MG capsule Take 1 capsule (12.5 mg total) by mouth daily.  Marland Kitchen HYDROmorphone (DILAUDID) 4 MG tablet TAKE  1-2 TABLET BY MOUTH EVERY 4 HOURS AS NEEDED FOR MODERATE OR SEVERE PAIN  . lidocaine-prilocaine (EMLA) cream Apply topically as needed. Apply to [facility] site 1-2 hours prior to stick and cover with plastic wrap to numb site (Patient taking differently: Apply 1 application topically daily as needed (port access). Apply to [facility] site 1-2 hours prior to stick and cover with plastic wrap to numb site)  . Multiple Vitamins-Minerals (MULTIVITAMIN) tablet Take 1 tablet by mouth daily.  Marland Kitchen loperamide (IMODIUM) 2 MG capsule TAKE ONE CAPSULE BY MOUTH AS NEEDED FOR DIARRHEA OR LOOSE STOOLS. MAX OF 8 CAPSULES PER DAY (Patient not taking: Reported on 10/24/2015)  . LORazepam (ATIVAN) 0.5 MG tablet Place 1 tablet (0.5 mg total) under the tongue every 6 (six) hours as needed (for nausea). Causes drowsiness (Patient not taking: Reported on 10/24/2015)  . ondansetron (ZOFRAN) 8 MG tablet TAKE 1 TABLET BY MOUTH EVERY 8 HOURS AS NEEDED FOR NAUSEA AND VOMITING (Patient not taking: Reported on 10/24/2015)  . polyethylene glycol powder (MIRALAX) powder Take 17 g by mouth daily as needed. (Patient not taking: Reported on 10/24/2015)  . potassium chloride SA (K-DUR,KLOR-CON) 20 MEQ tablet TAKE 1 TABLET BY MOUTH DAILY (Patient not taking: Reported on 10/24/2015)  . prochlorperazine (COMPAZINE) 10 MG tablet TAKE 1 TABLET BY MOUTH EVERY 6 HOURS AS NEEDED FOR NAUSEA OR VOMITING (Patient not taking: Reported on 10/24/2015)    FAMILY HISTORY:  Her indicated that her mother is deceased. She indicated that her father is deceased.   SOCIAL HISTORY: She  reports that she has never smoked. She has never used smokeless tobacco. She reports that she does not drink alcohol or use illicit drugs.  REVIEW OF SYSTEMS:   Positive for N/V, back  pain and leg pain but otherwise negative..  SUBJECTIVE:  Generalized pain, hungry.  VITAL SIGNS: BP 95/62 mmHg  Pulse 119  Temp(Src) 100.3 F (37.9 C) (Oral)  Resp 14  Ht '5\' 8"'$  (1.727 m)  Wt 112.038 kg  (247 lb)  BMI 37.56 kg/m2  SpO2 93%  HEMODYNAMICS:    VENTILATOR SETTINGS:    INTAKE / OUTPUT:    PHYSICAL EXAMINATION: General:  Chronically ill appearing female, NAD. Neuro:  Alert and interactive, moving all ext to command, foot drop on right leg. HEENT:  /AT, proptosis, PERRL, EOM-I and DMM. Cardiovascular:  RRR, Nl S1/S2, -M/R/G. Lungs:  CTA bilaterally.  Abdomen:  Soft, NT, ND and +BS. Musculoskeletal:  -edema and -tenderness, foot drop as above. Skin:  Intact, surgical scars noted.  LABS:  BMET  Recent Labs Lab 10/24/15 1853  NA 135  K 3.6  CL 101  CO2 24  BUN 29*  CREATININE 0.70  GLUCOSE 117*    Electrolytes  Recent Labs Lab 10/24/15 1853  CALCIUM 9.2    CBC  Recent Labs Lab 10/24/15 1853 10/24/15 2105  WBC 5.3  --   HGB 12.0  --   HCT 36.3  --   PLT 63* 59*    Coag's  Recent Labs Lab 10/24/15 2105  APTT 23*  INR 1.22    Sepsis Markers  Recent Labs Lab 10/24/15 1904 10/24/15 2111 10/25/15  LATICACIDVEN 2.47* 2.08* 1.5  PROCALCITON  --   --  0.32    ABG No results for input(s): PHART, PCO2ART, PO2ART in the last 168 hours.  Liver Enzymes  Recent Labs Lab 10/24/15 1853  AST 48*  ALT 72*  ALKPHOS 100  BILITOT 1.0  ALBUMIN 3.0*    Cardiac Enzymes No results for input(s): TROPONINI, PROBNP in the last 168 hours.  Glucose No results for input(s): GLUCAP in the last 168 hours.  Imaging Dg Chest 2 View  10/24/2015  CLINICAL DATA:  Shortness of breath.  Metastatic colon carcinoma. EXAM: CHEST  2 VIEW COMPARISON:  Chest CT August 19, 2015 ; thoracic MRI August 20, 2015 FINDINGS: Extensive pulmonary metastatic disease bilaterally is again noted. Pulmonary nodular lesions range in size from as small as 5 mm to as large as 5 x 4.5 cm. There is no edema or consolidation. Heart size and pulmonary vascularity are normal. No adenopathy evident. Port-A-Cath tip is in the superior vena cava. No pneumothorax. There is collapse  of the T6 vertebral body. IMPRESSION: Extensive pulmonary metastatic disease. No edema or consolidation. Cardiac silhouette within normal limits. Pathologic fracture of the T6 vertebral body appears grossly stable. Electronically Signed   By: Lowella Grip III M.D.   On: 10/24/2015 19:47   Abd 1 View (kub)  10/25/2015  CLINICAL DATA:  Nausea for several hours.  Lower abdominal pain. EXAM: ABDOMEN - 1 VIEW COMPARISON:  None. FINDINGS: The bowel gas pattern is normal. No biliary or urinary calculi. There are skeletal metastases, seen to better advantage on the recent cross-sectional imaging studies. IMPRESSION: Negative for obstruction or perforation. Electronically Signed   By: Andreas Newport M.D.   On: 10/25/2015 03:10   Ct Head Wo Contrast  10/24/2015  CLINICAL DATA:  Hypertension and weakness. History of metastatic colon cancer. EXAM: CT HEAD WITHOUT CONTRAST TECHNIQUE: Contiguous axial images were obtained from the base of the skull through the vertex without intravenous contrast. COMPARISON:  None. FINDINGS: Brain: No evidence of acute infarction, hemorrhage, extra-axial collection, ventriculomegaly, or mass effect. Mild brain parenchymal  volume loss and deep white matter microvascular changes. Vascular: No hyperdense vessel or unexpected calcification. Skull: Negative for fracture or focal lesion. Sinuses/Orbits: No acute findings. Other: None. IMPRESSION: No acute intracranial abnormality. Mild brain parenchymal atrophy and chronic microvascular disease. Electronically Signed   By: Fidela Salisbury M.D.   On: 10/24/2015 19:21     STUDIES:  AXR 6/29> no obstruction or perf. CXR 6/29> I reviewed myself, metastatic disease noted.  CULTURES: Blood 6/29>>> Urine 6/29>>> Sputum 6/29>>>  ANTIBIOTICS: Vanc 6/29>>> Zosyn 6/29>>>  SIGNIFICANT EVENTS:  6/29>>>admission to the ICU for hypotension.  LINES/TUBES: PIV  DISCUSSION: 60 year old female with stage 4 colon cancer s/p  palliative radiation and no further treatment options available who presents with N/V, orthostatic hypotension and confusion.  PCCM consulted for SIRS and hypotension.  Likely dehydrated.  Responding to IVF and lactic acid level is dropping.  Discussed with PCCM-NP.  ASSESSMENT / PLAN:  PULMONARY A: Metastatic disease in lungs from colon cancer, protecting airway. P:   - Titrate O2 for sat of 88-92%. - Monitor for airway protection, no intubation needed for now. - Require EOL discussion, insists on full code for now until family arrives in AM then willing to discuss. - PRN albuterol.  CARDIOVASCULAR A:  Orthostatic hypotension, dehydrate. Unable to R/O septic shock for now specially with low grad fever. P:  - No concern for hydration from a pulmonary standpoint. - IVF hydration. - No pressors for now. - Avoid pressors if able. - Change PO decadron to stress dose steroids. - Hold home anti-HTN.  RENAL A:   No active disease. P:   - IVF hydration. - BMET in AM. - Replace electrolytes as indicated.  GASTROINTESTINAL A:   Colon cancer stage 4. N/V. S/P hepatectomy and sigmoid colectomy. P:   - Hydrate. - NPO. - Zofran IV for N/V. - Pepcid IV, platelet count of 54 but unable to use IV PPI (unavailable due to shortage) and no bleeding noted.  HEMATOLOGIC A:   Thrombocytopenia due to chemo P:  - Monitor for now. - Transfuse only if bleeding or procedures are anticipated.  INFECTIOUS A:   Unknown source, WBC normal (chemo) and temp of 100.3 P:   - PCT protocol, if negative would consider d/c of abx. - Monitor WBC and fever curve. - Vancomycin/zosyn. - F/u on cultures.  ENDOCRINE A:   Chronic steroid use.   P:   - Stress dose steroids.  NEUROLOGIC A:   Confusion - likely a combination of narcotic, benzo and orthostatic hypotension. P:   - Minimize sedative as able but acknowledge that patient has metastatic colon cancer that must be very painful, will not  stop narcs for now. - Needs palliative care consult in AM when family is available bedside, refusing to discuss that at this time.  FAMILY  - Updates: No family bedside.  Patient will not discuss code status until son and daughter bedside.  - Inter-disciplinary family meet or Palliative Care meeting due by:  7/6.  The patient is critically ill with multiple organ systems failure and requires high complexity decision making for assessment and support, frequent evaluation and titration of therapies, application of advanced monitoring technologies and extensive interpretation of multiple databases.   Critical Care Time devoted to patient care services described in this note is  35  Minutes. This time reflects time of care of this signee Dr Jennet Maduro. This critical care time does not reflect procedure time, or teaching time or supervisory time of  PA/NP/Med student/Med Resident etc but could involve care discussion time.  Rush Farmer, M.D. [facility] Pulmonary/Critical Care Medicine. Pager: [phone]. After hours pager: [phone].  10/25/2015, 4:11 AM

## 2015-10-25 NOTE — Progress Notes (Signed)
VASCULAR LAB PRELIMINARY  PRELIMINARY  PRELIMINARY  PRELIMINARY  Left lower extremity venous duplex completed.    Preliminary report:  Left:  No evidence of DVT, superficial thrombosis, or Baker's cyst.  Latoia Eyster, RVS 10/25/2015, 8:35 AM

## 2015-10-25 NOTE — Telephone Encounter (Signed)
Call from [place] with [facility]: Will Dr. Benay Spice be pt's attending? YES. Is it OK to initiate hospice standing order set? YES.

## 2015-10-25 NOTE — Progress Notes (Addendum)
IP PROGRESS NOTE  Subjective:   [name] is well-known to me with a history of metastatic colon cancer. She is currently maintained off of specific therapy for colon cancer. She completed a course of pelvic radiation  to the lumbosacral spine on 10/21/2015. She noted improvement in left leg and lower back pain following the radiation. [name] missed a scheduled appointment at the Cancer center earlier this week. She reports nausea for the past several days with limited oral intake. She had some diarrhea. No fever. She continues to have pain and weakness of the left leg and foot. [name] presented to the emergency room yesterday for further evaluation.  In the emergency room she was noted to be hypotensive with tachycardia and fever. She was admitted to the ICU for further evaluation. [name] reports feeling better today. She complains of persistent pain in the left leg and foot.  Objective: Vital signs in last 24 hours: Blood pressure 102/64, pulse 119, temperature 98.5 F (36.9 C), temperature source Oral, resp. rate 12, height 5' 8" (1.727 m), weight 240 lb 11.9 oz (109.2 kg), SpO2 94 %.  Intake/Output from previous day: 06/29 0701 - 06/30 0700 In: 76 [IV Piggyback:50] Out: 800 [Urine:800]  Physical Exam:  HEENT: Mild whitecoat over the tongue, no buccal thrush or bleeding Lungs: Good air movement bilaterally at the anterior chest, scattered coarse rhonchi, no respiratory distress Cardiac: Regular rate and rhythm, tachycardia Abdomen: Nontender, no hepatomegaly, no mass Extremities: No leg edema Neurologic: Alert and oriented. She moves all extremities. 3+-4/5 strength throughout the left leg with 0-1/5 dorsi flexion at the left foot  Portacath/PICC-without erythema  Lab Results:  Recent Labs  10/24/15 1853 10/24/15 2105 10/25/15 0430  WBC 5.3  --  4.6  HGB 12.0  --  10.7*  HCT 36.3  --  31.6*  PLT 63* 59* 50*    BMET  Recent Labs  10/24/15 1853 10/25/15 0430   NA 135 136  K 3.6 3.3*  CL 101 107  CO2 24 23  GLUCOSE 117* 120*  BUN 29* 25*  CREATININE 0.70 0.61  CALCIUM 9.2 8.1*    Studies/Results: Dg Chest 2 View  10/24/2015  CLINICAL DATA:  Shortness of breath.  Metastatic colon carcinoma. EXAM: CHEST  2 VIEW COMPARISON:  Chest CT August 19, 2015 ; thoracic MRI August 20, 2015 FINDINGS: Extensive pulmonary metastatic disease bilaterally is again noted. Pulmonary nodular lesions range in size from as small as 5 mm to as large as 5 x 4.5 cm. There is no edema or consolidation. Heart size and pulmonary vascularity are normal. No adenopathy evident. Port-A-Cath tip is in the superior vena cava. No pneumothorax. There is collapse of the T6 vertebral body. IMPRESSION: Extensive pulmonary metastatic disease. No edema or consolidation. Cardiac silhouette within normal limits. Pathologic fracture of the T6 vertebral body appears grossly stable. Electronically Signed   By: Lowella Grip III M.D.   On: 10/24/2015 19:47   Abd 1 View (kub)  10/25/2015  CLINICAL DATA:  Nausea for several hours.  Lower abdominal pain. EXAM: ABDOMEN - 1 VIEW COMPARISON:  None. FINDINGS: The bowel gas pattern is normal. No biliary or urinary calculi. There are skeletal metastases, seen to better advantage on the recent cross-sectional imaging studies. IMPRESSION: Negative for obstruction or perforation. Electronically Signed   By: Andreas Newport M.D.   On: 10/25/2015 03:10   Ct Head Wo Contrast  10/24/2015  CLINICAL DATA:  Hypertension and weakness. History of metastatic colon cancer.  EXAM: CT HEAD WITHOUT CONTRAST TECHNIQUE: Contiguous axial images were obtained from the base of the skull through the vertex without intravenous contrast. COMPARISON:  None. FINDINGS: Brain: No evidence of acute infarction, hemorrhage, extra-axial collection, ventriculomegaly, or mass effect. Mild brain parenchymal volume loss and deep white matter microvascular changes. Vascular: No hyperdense  vessel or unexpected calcification. Skull: Negative for fracture or focal lesion. Sinuses/Orbits: No acute findings. Other: None. IMPRESSION: No acute intracranial abnormality. Mild brain parenchymal atrophy and chronic microvascular disease. Electronically Signed   By: Fidela Salisbury M.D.   On: 10/24/2015 19:21    Medications: I have reviewed the patient's current medications.  Assessment/Plan:  1. Metastatic colon cancer (T4, N0, M1) adenocarcinoma of the sigmoid colon status post sigmoid colectomy and partial right hepatectomy 04/11/2012. Microsatellite stable. No loss of mismatch repair proteins. K-ras wild-type on the initial codon 12 and 13 testing. K-ras Q61L and APC mutations identified on extended testing. BRAF not mutated. No HER-2 amplification on Foundation 1 testing.  She completed cycle 1 CAPOX beginning 05/25/2012.   She completed cycle 8 CAPOX beginning 10/19/2012.   CEA on 10/19/2012 normal at 0.8.   CEA 6.1 on 01/11/2013   Restaging CT on 01/11/2013 consistent with multiple new pulmonary metastases   Cycle 1 of FOLFIRI/Avastin 01/18/2013; cycle 2 02/01/2013,cycle 3 02/16/2013.   CEA 5.0 on 03/01/2013.   Cycle 4 FOLFIRI/Avastin 03/01/2013.   Cycle 5 FOLFIRI/Avastin 03/15/2013.   Restaging chest CT 03/29/2013 with possible slight decrease in size of a left upper lobe metastasis. Other pulmonary nodules stable.   Cycle 6 FOLFIRI/Avastin 03/29/2013   Cycle 7 of FOLFIRI/Avastin 04/12/2013.   CEA 6.3 05/03/2013.   Cycle 8 FOLFIRI/Avastin 05/03/2013.   Cycle 9 FOLFIRI/Avastin 05/17/2013.   Cycle 10 FOLFIRI/Avastin 05/31/2013.   CEA 10.6 on 06/19/2013.   CT chest 06/19/2013 with mild progression of bilateral pulmonary nodules/metastases.   Decision made to continue FOLFIRI/Avastin. She completed cycle 11 06/21/2013.   Cycle 12 FOLFIRI/Avastin 07/05/2013   Cycle 13 FOLFIRI/Avastin 07/19/2013   Cycle 14 FOLFIRI/Avastin 08/01/2013.    CT chest 08/28/2013 with increased in size of bilateral lung nodules.   PET scan 09/22/2013 with multiple hypermetabolic pulmonary nodules bilaterally. Nodules demonstrate progressive enlargement compared with prior CTs. No evidence of extrathoracic metastatic disease.   Status post SRS 2 lung nodules, completed 10/18/2013  CT 01/12/2014 with airspace disease surrounding treated pulmonary nodules, no new nodules  01/12/2014 CEA 0.9  CT chest 04/12/2014 with improvement in airspace disease surrounding treated pulmonary nodules, no new nodules  04/12/2014 CEA 8.2  07/03/2014 CEA 60.3  CT 07/03/2014 with masslike enlargement of 2 treated lung lesions  Cycle 1 Lonsurf 07/12/2014.  Cycle 2 Lonsurf 08/11/2014 or 08/12/2014  Cycle 3 Lonsurf 09/08/2014  Cycle 4 Lonsurf 10/06/2014  Restaging CT of the chest on 10/01/2014 with stable lung nodules  Cycle 5 Lonsurf 11/05/2014  Cycle 6 Lonsurf 12/03/2014  Restaging CTs of the chest, abdomen, and pelvis on 12/26/2014 with no evidence of disease progression, stable lung nodules  Cycle 7 Lonsurf 12/31/2014  Cycle 8 Lonsurf 01/28/2015  Elevated CEA 03/12/2015  CT 03/19/2015 with enlargement of bilateral lung masses and progressive bone metastases including an enlarging lesion at T6 with replacement of the vertebral body  Cycle 1 FOLFOX/Avastin 03/25/2015  Cycle 2 FOLFOX/Avastin 04/08/2015  Cycle 3 FOLFOX/Avastin 04/23/2015  Cycle 4 FOLFOX/Avastin 05/08/2015  Cycle 5 FOLFOX/Avastin 05/22/2015  Restaging CT scans 06/10/2015 with decreased size of bilateral pulmonary metastases; stable hepatic steatosis and postoperative changes from partial hepatectomy;  no definite liver metastases; mild increase in size of lytic bone metastasis in the right ilium; stable T6 vertebral body lytic metastasis  CT right femur 06/12/2015 with lesion in the right femur, status post placement of an intramedullary nail 06/15/2015  Cycle 1  Regorafenib beginning 08/02/2015 (prematurely discontinued)  Pathologic T6 compression fracture 08/19/2015 status post [facility] 08/26/2015  Cycle 2 Regorafenib beginning 08/29/2015 (dose reduced to 80 mg daily for 21 days followed by a 7 day break)  Cycle 3 Regorafenib beginning 09/26/2015, discontinued on hospital admission 10/02/2015 2. Microcytic anemia, iron deficiency. Improved.  3. Status post Port-A-Cath placement 03/14/2012. X-ray showed the Port-A-Cath tip to be in the azygos vein. The left-sided Port-A-Cath was removed and a new right Port-A-Cath was placed on 05/23/2012. 4. 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 Xarelto discontinued April 2017 due to rectal bleeding. 5. Hypertension-persistent on amlodipine. HCTZ added 12/26/2014 6. Mild oxaliplatin neuropathy 7. CT right femur 06/12/2015 with a partially visualized 4.3 x 2.3 cm lucent lesion in the inferior aspect of the right iliac bone superior to the right acetabulum. Associated destruction of the cortex of the bone; second lesion noted in the midportion of the right femur measuring 1.7 x 6.6 cm. Partial erosive changes of the cortex of the bone adjacent to the lesion.   Status post placement of an intramedullary nail right femur 06/15/2015.   She completed palliative radiation to the right femur and right pelvis 07/04/2015 through 07/19/2015.  11. Anemia secondary to chronic disease, chemotherapy and rectal bleeding 12. Severe left leg pain-likely related to tumor involving the lumbosacral spine  Palliative radiation to lumbosacral spine initiated 10/03/15, completed 10/21/2015 14. 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 Admission 10/24/2015 with fever, hypotension, and nausea 16. Thrombocytopenia-likely secondary to radiation, metastatic disease involving the bone marrow, and potentially a systemic infection  [name] has  advanced metastatic colon cancer. The admission chest x-ray confirms extensive metastatic disease involving the lungs. She had recent Progression of disease at the lumbosacral spine and completed a course of palliative radiation.  [name] has a poor prognosis for survival beyond weeks to a few months. I recommend Hospice care. She agrees to a [facility] referral. She would prefer to remain at home with Hospice care. I think she would benefit from [facility] given her home situation and limited mobility. We discussed CODE STATUS. She has not made a decision on her CODE STATUS.  The acute presentation may be related to toxicity from recent radiation with nausea, diarrhea, and dehydration. The fever could be related to systemic infection or "tumor "fever  Recommendations: 1. Continue intravenous hydration, antibiotics, and follow-up cultures 2. Agree with palliative care consult to discuss Hospice care and CODE STATUS 3. [facility] referral 4. Continue Decadron and narcotic analgesics 5. Follow up on the thrombocytopenia 6. Please call Oncology as needed. I will be out until 10/31/2015.     LOS: 1 day   Betsy Coder, MD   10/25/2015, 7:44 AM

## 2015-10-25 NOTE — Progress Notes (Signed)
Date: October 25 2015/Fusae Florio Rosana Hoes, RN, BSN, CCM   [phone]/tct-Stacie Delice Lesch of [facility] referral for home care given along with son contact information.

## 2015-10-25 NOTE — Progress Notes (Addendum)
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 She has also had diarrhea. She was found to be hypotensive in the ER with a temp of 100.4 and tachycardia. She was admitted for sepsis. Oncology, Dr Ammie Dalton had recommended hospice for her but she had declined. He recommended DNR today but she states she will think about it further prior to making a decision.   Subjective: Continues to have pain in left foot and leg- feels like pins and needles- had a boot for her left foot drop but it was making the pain worse - she is trying to get a new one. Was getting to the bedside commode but recently has not been able to do this. No diarrhea, abdominal pain or vomiting.   Assessment & Plan:   Active Problems: Fever, hypotension, diarrhea, lactic acidosis, dehydration- SIRS - has received Vanc, Cefepime and Zosyn - diarrhea resolved- abdomen benign, cont IVF - tachycardia improving - F/u cultures - has been on Decadron- change to Solu-cortef for stress dosing  Left foot and leg pain and swelling - thought to be due to 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 - has been on 40 mg of Dilaudid Q 4 hrs at home  - Dilaudid changed to IV- add Neurontin and MS Contin - has been on Decadron- Solu-cortef for now - duplex neg for DVT    Essential hypertension - hypotensive now    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     Bone metastasis  - palliative care per oncology- just completed pelvic radiation     Thrombocytopenia  - due to infection??- follow    DVT prophylaxis: SCDs Code Status: full code Family Communication:  Disposition Plan:  Consultants:   oncology Procedures:   Venous duplex LE  Antimicrobials:  Anti-infectives    Start      Dose/Rate Route Frequency Ordered Stop   10/25/15 1000  ceFEPIme (MAXIPIME) 2 g in dextrose 5 % 50 mL IVPB  Status:  Discontinued     2 g 100 mL/hr over 30 Minutes Intravenous Every 12 hours 10/24/15 2002 10/24/15 2325   10/25/15 0800  vancomycin (VANCOCIN) IVPB 1000 mg/200 mL premix     1,000 mg 200 mL/hr over 60 Minutes Intravenous Every 12 hours 10/24/15 2002     10/25/15 0800  piperacillin-tazobactam (ZOSYN) IVPB 3.375 g     3.375 g 12.5 mL/hr over 240 Minutes Intravenous Every 8 hours 10/25/15 0002     10/24/15 2359  piperacillin-tazobactam (ZOSYN) IVPB 3.375 g     3.375 g 12.5 mL/hr over 240 Minutes Intravenous  Once 10/24/15 2322 10/25/15 0428   10/24/15 2015  vancomycin (VANCOCIN) IVPB 1000 mg/200 mL premix     1,000 mg 200 mL/hr over 60 Minutes Intravenous STAT 10/24/15 2002 10/24/15 2224   10/24/15 1945  vancomycin (VANCOCIN) IVPB 1000 mg/200 mL premix     1,000 mg 200 mL/hr over 60 Minutes Intravenous  Once 10/24/15 1944 10/24/15 2109   10/24/15 1945  ceFEPIme (MAXIPIME) 2 g in dextrose 5 % 50 mL IVPB     2 g 100 mL/hr over 30 Minutes Intravenous  Once 10/24/15 1944 10/24/15 2300       Objective: Filed Vitals:   10/25/15 0500 10/25/15 0600 10/25/15 0700 10/25/15  0800  BP: 103/57 114/66 102/64   Pulse:      Temp:    98.1 F (36.7 C)  TempSrc:    Oral  Resp: '15 16 12   '$ Height:      Weight:      SpO2: 92% 93% 94%     Intake/Output Summary (Last 24 hours) at 10/25/15 1303 Last data filed at 10/25/15 0200  Gross per 24 hour  Intake     50 ml  Output    800 ml  Net   -750 ml   Filed Weights   10/24/15 1721 10/25/15 0427  Weight: 112.038 kg (247 lb) 109.2 kg (240 lb 11.9 oz)    Examination: General exam: Appears uncomfortable due to pain HEENT: PERRLA, oral mucosa moist, no sclera icterus or thrush Respiratory system: Clear to auscultation. Respiratory effort normal. Cardiovascular system: S1 & S2 heard, RRR.  No murmurs  Gastrointestinal system: Abdomen  soft, non-tender, nondistended. Normal bowel sound. No organomegaly Central nervous system: Alert and oriented. No focal neurological deficits. Extremities: No cyanosis, clubbing- edema in right lower leg and foot- left foot and leg very tender to touch- left foot drop Skin: No rashes or ulcers Psychiatry:  Mood & affect appropriate.     Data Reviewed: I have personally reviewed following labs and imaging studies  CBC:  Recent Labs Lab 10/24/15 1853 10/24/15 2105 10/25/15 0430  WBC 5.3  --  4.6  NEUTROABS 4.9  --   --   HGB 12.0  --  10.7*  HCT 36.3  --  31.6*  MCV 85.4  --  85.9  PLT 63* 59* 50*   Basic Metabolic Panel:  Recent Labs Lab 10/24/15 1853 10/25/15 0430  NA 135 136  K 3.6 3.3*  CL 101 107  CO2 24 23  GLUCOSE 117* 120*  BUN 29* 25*  CREATININE 0.70 0.61  CALCIUM 9.2 8.1*  MG  --  1.7  PHOS  --  3.4   GFR: Estimated Creatinine Clearance: 95.6 mL/min (by C-G formula based on Cr of 0.61). Liver Function Tests:  Recent Labs Lab 10/24/15 1853 10/25/15 0430  AST 48* 44*  ALT 72* 58*  ALKPHOS 100 83  BILITOT 1.0 1.3*  PROT 5.6* 4.7*  ALBUMIN 3.0* 2.6*   No results for input(s): LIPASE, AMYLASE in the last 168 hours. No results for input(s): AMMONIA in the last 168 hours. Coagulation Profile:  Recent Labs Lab 10/24/15 2105  INR 1.22   Cardiac Enzymes: No results for input(s): CKTOTAL, CKMB, CKMBINDEX, TROPONINI in the last 168 hours. BNP (last 3 results) No results for input(s): PROBNP in the last 8760 hours. HbA1C: No results for input(s): HGBA1C in the last 72 hours. CBG: No results for input(s): GLUCAP in the last 168 hours. Lipid Profile: No results for input(s): CHOL, HDL, LDLCALC, TRIG, CHOLHDL, LDLDIRECT in the last 72 hours. Thyroid Function Tests:  Recent Labs  10/25/15 0430  TSH 0.874   Anemia Panel: No results for input(s): VITAMINB12, FOLATE, FERRITIN, TIBC, IRON, RETICCTPCT in the last 72 hours. Urine analysis:      Component Value Date/Time   COLORURINE YELLOW 10/25/2015 0143   APPEARANCEUR CLOUDY* 10/25/2015 0143   LABSPEC 1.029 10/25/2015 0143   PHURINE 5.0 10/25/2015 0143   GLUCOSEU NEGATIVE 10/25/2015 0143   GLUCOSEU NEG mg/dL 06/01/2006 2232   HGBUR NEGATIVE 10/25/2015 0143   BILIRUBINUR NEGATIVE 10/25/2015 0143   KETONESUR NEGATIVE 10/25/2015 0143   PROTEINUR NEGATIVE 10/25/2015 0143   UROBILINOGEN 0.2 04/07/2012  [place] 10/25/2015 0143   LEUKOCYTESUR NEGATIVE 10/25/2015 0143   Sepsis Labs: '@LABRCNTIP'$ (procalcitonin:4,lacticidven:4) ) Recent Results (from the past 240 hour(s))  Culture, blood (Routine x 2)     Status: None (Preliminary result)   Collection Time: 10/24/15  6:40 PM  Result Value Ref Range Status   Specimen Description   Final    BLOOD PORTA CATH Performed at [facility]    Special Requests BOTTLES DRAWN AEROBIC AND ANAEROBIC [facility]  Final   Culture PENDING  Incomplete   Report Status PENDING  Incomplete  MRSA PCR Screening     Status: None   Collection Time: 10/24/15 11:00 PM  Result Value Ref Range Status   MRSA by PCR NEGATIVE NEGATIVE Final    Comment:        The GeneXpert MRSA Assay (FDA approved for NASAL specimens only), is one component of a comprehensive MRSA colonization surveillance program. It is not intended to diagnose MRSA infection nor to guide or monitor treatment for MRSA infections.          Radiology Studies: Dg Chest 2 View  10/24/2015  CLINICAL DATA:  Shortness of breath.  Metastatic colon carcinoma. EXAM: CHEST  2 VIEW COMPARISON:  Chest CT August 19, 2015 ; thoracic MRI August 20, 2015 FINDINGS: Extensive pulmonary metastatic disease bilaterally is again noted. Pulmonary nodular lesions range in size from as small as 5 mm to as large as 5 x 4.5 cm. There is no edema or consolidation. Heart size and pulmonary vascularity are normal. No adenopathy evident. Port-A-Cath tip is in the superior vena cava. No  pneumothorax. There is collapse of the T6 vertebral body. IMPRESSION: Extensive pulmonary metastatic disease. No edema or consolidation. Cardiac silhouette within normal limits. Pathologic fracture of the T6 vertebral body appears grossly stable. Electronically Signed   By: Lowella Grip III M.D.   On: 10/24/2015 19:47   Abd 1 View (kub)  10/25/2015  CLINICAL DATA:  Nausea for several hours.  Lower abdominal pain. EXAM: ABDOMEN - 1 VIEW COMPARISON:  None. FINDINGS: The bowel gas pattern is normal. No biliary or urinary calculi. There are skeletal metastases, seen to better advantage on the recent cross-sectional imaging studies. IMPRESSION: Negative for obstruction or perforation. Electronically Signed   By: Andreas Newport M.D.   On: 10/25/2015 03:10   Ct Head Wo Contrast  10/24/2015  CLINICAL DATA:  Hypertension and weakness. History of metastatic colon cancer. EXAM: CT HEAD WITHOUT CONTRAST TECHNIQUE: Contiguous axial images were obtained from the base of the skull through the vertex without intravenous contrast. COMPARISON:  None. FINDINGS: Brain: No evidence of acute infarction, hemorrhage, extra-axial collection, ventriculomegaly, or mass effect. Mild brain parenchymal volume loss and deep white matter microvascular changes. Vascular: No hyperdense vessel or unexpected calcification. Skull: Negative for fracture or focal lesion. Sinuses/Orbits: No acute findings. Other: None. IMPRESSION: No acute intracranial abnormality. Mild brain parenchymal atrophy and chronic microvascular disease. Electronically Signed   By: Fidela Salisbury M.D.   On: 10/24/2015 19:21      Scheduled Meds: . dexamethasone  4 mg Oral BID  . famotidine (PEPCID) IV  20 mg Intravenous Q12H  . ferrous sulfate  325 mg Oral Q breakfast  . gabapentin  100 mg Oral TID  . morphine  30 mg Oral Q12H  . piperacillin-tazobactam (ZOSYN)  IV  3.375 g Intravenous Q8H  . sodium chloride flush  3 mL Intravenous Q12H  .  vancomycin  1,000 mg Intravenous Q12H  Continuous Infusions:    LOS: 1 day    Time spent in minutes: 31    [place], MD [facility] Pager: [email or web address] Password [email or web address] 10/25/2015, 1:03 PM

## 2015-10-25 NOTE — Progress Notes (Addendum)
[facility] Liaison Note  Received request from [place] this am for family interest in [facility]. Chart reviewed and received report from Imogene Burn, [place] with Palliative Medicine Team and [name], LCSW with cancer center. Later in afternoon received notification referral is for home with hospice rather than [facility]. Spoke with son [name] by phone to confirm interest, explain services and answer questions. Met briefly with patient and daughter [name] to confirm interest and make them aware [facility] is working on arrangements for home. Dr. Benay Spice has been confirmed to attend. Confirmed no additional DME needed at this time. Patient already has wheelchair, walker, BSC and tube seat. Family and RNCM Suanne Marker uncertain of discharge date at this time. [facility] hospital liaison will follow daily until discharge date is determined. [name] is aware [facility] will contact them to schedule admission visit. Best number to reach [name] at is [phone].   Upon discharge please: Send scripts for comfort medications with patient. Please fax discharge summary to [phone]. Please make [facility] aware of discharge by calling [phone].   Thank you,  Erling Conte, LCSW [phone]

## 2015-10-25 NOTE — Consult Note (Signed)
Consultation Note Date: 10/25/2015   Patient Name: [name]  DOB: 1955/08/06  MRN: [id number]  Age / Sex: 60 y.o., female  PCP: Donald Prose, MD Referring Physician: Debbe Odea, MD  Reason for Consultation: Establishing goals of care and Hospice Evaluation  HPI/Patient Profile: 60 y.o. female  with past medical history of anxiety, heart murmur, and colon cancer with liver and bone mets who was admitted on 10/24/2015 with worsening leg pain, nausea, vomiting, confusion and fever.  Today her nausea and vomiting are somewhat improved.  Doppler of her left leg is negative for DVT.  Dr. Benay Spice has cared for [name] for a long time.  There are no more chemotherapy options for her.  He has recommended Hospice services and specifically [facility].  The patient agreed to Hospice services in her conversation with Dr. Benay Spice.  Clinical Assessment and Goals of Care: I met with the patient and [name] (of the [place]) at bedside.  The patient accepted my visit and was very polite but refused to discuss any "Palliative" issues such as advanced directives, code status, or Hospice services.  I attempted to engage her multiple times in casual conversation and then lead into a more Palliative discussion, but the patient nicely refused three times.    I spoke with the bedside RN, Delmar Landau of [address] and [place] CSW immediately after my visit.  We agreed that her son [name]) will need to make these decisions for her.   The patient indicated to [name] that she has told her son what she wants and he has that information.  I reached [name] (Son) on [phone].  He understands that his mother would most likely not survive a code, but he respects her wish to remain a full code.   [name] accepts Hospice services at home on behalf of his mother.   NEXT OF KINKailena [name], Son. [phone],  [phone], [phone])    SUMMARY OF RECOMMENDATIONS   Patient is still a full code. Hospice Services at home.  Prognosis:   Weeks - months.  Per Dr. Gearldine Shown notes  Discharge Planning: Home with Hospice.       Primary Diagnoses: Present on Admission:  . 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 Bone metastasis ([place]) . Essential hypertension . Sepsis ([place]) . Thrombocytopenia ([place])  I have reviewed the medical record, interviewed the patient and family, and examined the patient. The following aspects are pertinent.  Past Medical History  Diagnosis Date  . Hypertension     pt states she stopped taking meds while in her 20's  . Blood in stool   . History of blood transfusion   . Anemia   . Eczema   . Heart murmur     does not see a cardiologist has been told she does has a murmur and also that she does not have a murmur  . Anxiety   . Neuromuscular disorder (H[facility])     numbness feet  . Hx of radiation therapy 10/06/13,16th,19th,22nd,&10/18/13  SBRT lung  . Cancer ([place])   . Metastatic colon cancer to liver [facility])    Social History   Social History  . Marital Status: Single    Spouse Name: N/A  . Number of Children: N/A  . Years of Education: N/A   Social History Main Topics  . Smoking status: Never Smoker   . Smokeless tobacco: Never Used  . Alcohol Use: No  . Drug Use: No  . Sexual Activity: Not Currently   Other Topics Concern  . None   Social History Narrative   Family History  Problem Relation Age of Onset  . Hypertension Mother   . Cancer Mother 63    breast ca  . Heart disease Father    Scheduled Meds: . famotidine (PEPCID) IV  20 mg Intravenous Q12H  . ferrous sulfate  325 mg Oral Q breakfast  . gabapentin  100 mg Oral TID  . hydrocortisone sod succinate (SOLU-CORTEF) inj  100 mg Intravenous Q8H  . morphine  30 mg Oral Q12H  . piperacillin-tazobactam (ZOSYN)  IV  3.375 g Intravenous Q8H  . sodium chloride flush  3 mL  Intravenous Q12H  . vancomycin  1,000 mg Intravenous Q12H   Continuous Infusions:  PRN Meds:.acetaminophen **OR** acetaminophen, albuterol, HYDROmorphone (DILAUDID) injection, ondansetron **OR** ondansetron (ZOFRAN) IV   No Known Allergies Review of Systems  :  Patient complains of fatigue and left leg pain.  She denies further vomiting.  Physical Exam  Wd, Obese, female, appears younger than stated age. CV:  RRR Resp:  NAD Abdomen:  Soft Extremities:  Able to move all 4  Vital Signs: BP 102/64 mmHg  Pulse 119  Temp(Src) 98.1 F (36.7 C) (Oral)  Resp 12  Ht _0  (1.727 m)  Wt 109.2 kg (240 lb 11.9 oz)  BMI 36.61 kg/m2  SpO2 94% Pain Assessment: 0-10   Pain Score: 8    SpO2: SpO2: 94 % O2 Device:SpO2: 94 % O2 Flow Rate: .   IO: Intake/output summary:   Intake/Output Summary (Last 24 hours) at 10/25/15 1343 Last data filed at 10/25/15 0200  Gross per 24 hour  Intake     50 ml  Output    800 ml  Net   -750 ml    LBM: Last BM Date: 10/25/15 Baseline Weight: Weight: 112.038 kg (247 lb) Most recent weight: Weight: 109.2 kg (240 lb 11.9 oz)     Palliative Assessment/Data:     Time In: 10:30 Time Out: 11:40 Time Total: 70 min  Greater than 50%  of this time was spent counseling and coordinating care related to the above assessment and plan.  Signed by: Imogene Burn, PA-C Palliative Medicine Pager: [phone]   Please contact Palliative Medicine Team phone at [phone] for questions and concerns.  For individual provider: See Shea Evans

## 2015-10-25 NOTE — Progress Notes (Signed)
Date: October 25 2015/1016/[name], BSN, [place]   [phone] tct-[name] with [facility] hospice referral given to talk with patient concerning [facility] place versus home hospice.

## 2015-10-25 NOTE — Telephone Encounter (Signed)
returned call and lvm for pt to call back to r/s appt....pt is in the hospital now

## 2015-10-25 NOTE — Progress Notes (Signed)
Notes and chart reviewed 60 Y/o with metastatic colon can admitted with hypotension, SIRS BP has improved overnight after IVF with normalization of lactate  Blood pressure 102/64, pulse 119, temperature 98.5 F (36.9 C), temperature source Oral, resp. rate 12, height '5\' 8"'$  (1.727 m), weight 240 lb 11.9 oz (109.2 kg), SpO2 94 %. Stable on examination CVS-RRR RS- Clear Abd- Soft, + BS Ext- No edema  PCCM will sign off. Discussed with Dr. Wynelle Cleveland.  Marshell Garfinkel MD [facility] Pager [phone] If no answer or after 3pm call: [phone] 10/25/2015, 8:37 AM

## 2015-10-25 NOTE — Progress Notes (Signed)
[place] Work  Clinical Social Work was referred by Dr. Benay Spice as pt requested CSW to visit with pt in her room at [facility] ICU/Stepdown. Per Dr. Benay Spice, pt open to hospice referral and had agreed to possibly placement at [facility]. CSW visited with pt at length to provide support, review possible plans after this hospitalization and also attended brief meeting with pt and Imogene Burn with Palliative Team.   Pt lives in her two story townhome where the bathrooms and bedrooms are on the second floor. The living room and kitchen are on the main floor. She resides with her daughter and one granddaughter. Her son, [name] lives in [place]. This CSW has followed Ms. Brick for several years through her cancer journey. CSW has had several discussions with Ms [place]ado about her plans, support, and HCPOA/ADRs. Pt has not completed ADRs to date and this is a very difficult thing for her to discuss. At times, pt is open to discussing, but then shuts down the conversation. She reports she has spoken with her son, [name] about her wishes and plans and "he knows what to do". CSW encouraged pt to share her wishes with the team or get [name] to tell the team. Pt appears unable to make these plans/decisions on her own. Pt appears to not have adequate support at home currently, but appears open to hospice referral. She is not currently open to [facility] placement. Pt asked CSW to leave as she "was done talking about this". She suggested CSW go home and "not worry about this stuff". [facility] CSW will continue to follow. CSW updated team, who reports to have placed calls to pt's son. RN Navigator plans to visit this afternoon as well.   Clinical Social Work interventions: Supportive listening Education   [name], [place] Social Worker [place]  [place] Phone: [phone] Fax: [phone]

## 2015-10-26 LAB — CBC
HCT: 27.4 % — ABNORMAL LOW (ref 36.0–46.0)
Hemoglobin: 9.2 g/dL — ABNORMAL LOW (ref 12.0–15.0)
MCH: 28.6 pg (ref 26.0–34.0)
MCHC: 33.6 g/dL (ref 30.0–36.0)
MCV: 85.1 fL (ref 78.0–100.0)
PLATELETS: 48 10*3/uL — AB (ref 150–400)
RBC: 3.22 MIL/uL — ABNORMAL LOW (ref 3.87–5.11)
RDW: 17.6 % — AB (ref 11.5–15.5)
WBC: 3.8 10*3/uL — AB (ref 4.0–10.5)

## 2015-10-26 LAB — BASIC METABOLIC PANEL
Anion gap: 8 (ref 5–15)
BUN: 16 mg/dL (ref 6–20)
CALCIUM: 8 mg/dL — AB (ref 8.9–10.3)
CHLORIDE: 106 mmol/L (ref 101–111)
CO2: 22 mmol/L (ref 22–32)
CREATININE: 0.54 mg/dL (ref 0.44–1.00)
GFR calc Af Amer: >60 mL/min (ref >60)
GFR calc non Af Amer: >60 mL/min (ref >60)
Glucose, Bld: 195 mg/dL — ABNORMAL HIGH (ref 65–99)
Potassium: 2.9 mmol/L — ABNORMAL LOW (ref 3.5–5.1)
SODIUM: 136 mmol/L (ref 135–145)

## 2015-10-26 LAB — PROCALCITONIN: PROCALCITONIN: 0.2 ng/mL

## 2015-10-26 LAB — URINE CULTURE: Culture: NO GROWTH

## 2015-10-26 MED ORDER — POTASSIUM CHLORIDE 10 MEQ/100ML IV SOLN
10.0000 meq | INTRAVENOUS | Status: AC
  Administered 2015-10-26 – 2015-10-27 (×3): 10 meq via INTRAVENOUS
  Filled 2015-10-26 (×3): qty 100

## 2015-10-26 MED ORDER — HYDROMORPHONE HCL 4 MG PO TABS
4.0000 mg | ORAL_TABLET | ORAL | Status: DC | PRN
Start: 2015-10-26 — End: 2015-10-28
  Administered 2015-10-26 – 2015-10-28 (×8): 4 mg via ORAL
  Filled 2015-10-26 (×3): qty 2
  Filled 2015-10-26 (×2): qty 1
  Filled 2015-10-26: qty 2
  Filled 2015-10-26 (×2): qty 1

## 2015-10-26 MED ORDER — HYDROCORTISONE NA SUCCINATE PF 100 MG IJ SOLR
50.0000 mg | Freq: Three times a day (TID) | INTRAMUSCULAR | Status: DC
  Administered 2015-10-26 – 2015-10-28 (×7): 50 mg via INTRAVENOUS
  Filled 2015-10-26 (×7): qty 2

## 2015-10-26 MED ORDER — AMOXICILLIN-POT CLAVULANATE 875-125 MG PO TABS
1.0000 | ORAL_TABLET | Freq: Two times a day (BID) | ORAL | Status: DC
  Administered 2015-10-26 – 2015-10-28 (×5): 1 via ORAL
  Filled 2015-10-26 (×5): qty 1

## 2015-10-26 NOTE — Progress Notes (Addendum)
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 She has also had diarrhea. She was found to be hypotensive in the ER with a temp of 100.4 and tachycardia. She was admitted for sepsis. Oncology, Dr Ammie Dalton had recommended hospice for her but she had declined. He recommended DNR but she states she will think about it further prior to making a decision.   Subjective: Pain is controlled as long as she receives her medications. No nausea or vomiting. Eating well. Foot is still "not working". Asked what her ultimate goal is. She states she wants the foot to get better and the Reita Cliche will heal it soon. Discussed getting out of bed to chair today. She agrees to try.   Assessment & Plan:   Active Problems: Fever (102.1), hypotension (76/34) , diarrhea, lactic acidosis, dehydration- SIRS - has received Vanc, Cefepime and Zosyn - diarrhea resolved- abdomen benign, cont IVF - tachycardia improving - F/u cultures - has been on Decadron- changed to Solu-cortef for stress dosing -fever has nor recurred-  Cultures from 6/29 negative so far- MRSA screen negative - stop Vanc and Zosyn today- switch to Augmentin to cover GI infection as she had no other symptoms related to other organ systems- will continue to follow cultures for 24 hrs  U retention - 800 cc on I and O cath 2 AM on 6/29  - > 650 cc U retention subsequently found on bladder scan last night  - foley placed- has skin breakdown as well from moisture   Left foot and leg pain and swelling - thought to be due to 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 - has been on 4 mg of Dilaudid Q 4 hrs at home which we can continue- she does not want to change this- d/c IV Dilaudid today - has been on Decadron-  Solu-cortef given for stress dosing - duplex neg for DVT    Essential hypertension - hypotensive on admission- improving - Norvasc and HCTZ on hold    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     Bone metastasis  - palliative care per oncology- just completed pelvic radiation     Thrombocytopenia  - due to infection??- follow    DVT prophylaxis: SCDs Code Status: full code Family Communication: son [name] Disposition Plan: home with hospice in 1-2 days Consultants:   oncology Procedures:   Venous duplex LE  Antimicrobials:  Anti-infectives    Start     Dose/Rate Route Frequency Ordered Stop   10/25/15 1000  ceFEPIme (MAXIPIME) 2 g in dextrose 5 % 50 mL IVPB  Status:  Discontinued     2 g 100 mL/hr over 30 Minutes Intravenous Every 12 hours 10/24/15 2002 10/24/15 2325   10/25/15 0800  vancomycin (VANCOCIN) IVPB 1000 mg/200 mL premix     1,000 mg 200 mL/hr over 60 Minutes Intravenous Every 12 hours 10/24/15 2002     10/25/15 0800  piperacillin-tazobactam (ZOSYN) IVPB 3.375 g     3.375 g 12.5 mL/hr over 240 Minutes Intravenous Every 8 hours 10/25/15 0002     10/24/15 2359  piperacillin-tazobactam (ZOSYN) IVPB 3.375 g     3.375 g 12.5 mL/hr over 240 Minutes Intravenous  Once 10/24/15 2322 10/25/15 0428   10/24/15 2015  vancomycin (  VANCOCIN) IVPB 1000 mg/200 mL premix     1,000 mg 200 mL/hr over 60 Minutes Intravenous STAT 10/24/15 2002 10/24/15 2224   10/24/15 1945  vancomycin (VANCOCIN) IVPB 1000 mg/200 mL premix     1,000 mg 200 mL/hr over 60 Minutes Intravenous  Once 10/24/15 1944 10/24/15 2109   10/24/15 1945  ceFEPIme (MAXIPIME) 2 g in dextrose 5 % 50 mL IVPB     2 g 100 mL/hr over 30 Minutes Intravenous  Once 10/24/15 1944 10/24/15 2300       Objective: Filed Vitals:   10/26/15 0600 10/26/15 0800 10/26/15 0900 10/26/15 1000  BP: 124/75 126/81  131/81  Pulse:      Temp:  98.3 F (36.8 C)    TempSrc:  Oral    Resp: '11 19 19 13  '$ Height:      Weight:       SpO2: 97% 97% 97% 97%    Intake/Output Summary (Last 24 hours) at 10/26/15 1059 Last data filed at 10/26/15 0811  Gross per 24 hour  Intake    670 ml  Output   1025 ml  Net   -355 ml   Filed Weights   10/24/15 1721 10/25/15 0427  Weight: 112.038 kg (247 lb) 109.2 kg (240 lb 11.9 oz)    Examination: General exam: Appears uncomfortable due to pain HEENT: PERRLA, oral mucosa moist, no sclera icterus or thrush Respiratory system: Clear to auscultation. Respiratory effort normal. Cardiovascular system: S1 & S2 heard, RRR.  No murmurs  Gastrointestinal system: Abdomen soft, non-tender, nondistended. Normal bowel sound. No organomegaly Central nervous system: Alert and oriented. No focal neurological deficits. Extremities: No cyanosis, clubbing- edema in right lower leg and foot- left foot and leg very tender to touch- left foot drop Skin: No rashes or ulcers Psychiatry:  Mood & affect appropriate.     Data Reviewed: I have personally reviewed following labs and imaging studies  CBC:  Recent Labs Lab 10/24/15 1853 10/24/15 2105 10/25/15 0430  WBC 5.3  --  4.6  NEUTROABS 4.9  --   --   HGB 12.0  --  10.7*  HCT 36.3  --  31.6*  MCV 85.4  --  85.9  PLT 63* 59* 50*   Basic Metabolic Panel:  Recent Labs Lab 10/24/15 1853 10/25/15 0430  NA 135 136  K 3.6 3.3*  CL 101 107  CO2 24 23  GLUCOSE 117* 120*  BUN 29* 25*  CREATININE 0.70 0.61  CALCIUM 9.2 8.1*  MG  --  1.7  PHOS  --  3.4   GFR: Estimated Creatinine Clearance: 95.6 mL/min (by C-G formula based on Cr of 0.61). Liver Function Tests:  Recent Labs Lab 10/24/15 1853 10/25/15 0430  AST 48* 44*  ALT 72* 58*  ALKPHOS 100 83  BILITOT 1.0 1.3*  PROT 5.6* 4.7*  ALBUMIN 3.0* 2.6*   No results for input(s): LIPASE, AMYLASE in the last 168 hours. No results for input(s): AMMONIA in the last 168 hours. Coagulation Profile:  Recent Labs Lab 10/24/15 2105  INR 1.22   Cardiac Enzymes: No results for  input(s): CKTOTAL, CKMB, CKMBINDEX, TROPONINI in the last 168 hours. BNP (last 3 results) No results for input(s): PROBNP in the last 8760 hours. HbA1C: No results for input(s): HGBA1C in the last 72 hours. CBG: No results for input(s): GLUCAP in the last 168 hours. Lipid Profile: No results for input(s): CHOL, HDL, LDLCALC, TRIG, CHOLHDL, LDLDIRECT in the last 72 hours. Thyroid Function Tests:  Recent Labs  10/25/15 0430  TSH 0.874   Anemia Panel: No results for input(s): VITAMINB12, FOLATE, FERRITIN, TIBC, IRON, RETICCTPCT in the last 72 hours. Urine analysis:    Component Value Date/Time   COLORURINE YELLOW 10/25/2015 0143   APPEARANCEUR CLOUDY* 10/25/2015 0143   LABSPEC 1.029 10/25/2015 0143   PHURINE 5.0 10/25/2015 0143   GLUCOSEU NEGATIVE 10/25/2015 0143   GLUCOSEU NEG mg/dL 06/01/2006 2232   HGBUR NEGATIVE 10/25/2015 0143   BILIRUBINUR NEGATIVE 10/25/2015 0143   KETONESUR NEGATIVE 10/25/2015 0143   PROTEINUR NEGATIVE 10/25/2015 0143   UROBILINOGEN 0.2 04/07/2012 0948   NITRITE NEGATIVE 10/25/2015 0143   LEUKOCYTESUR NEGATIVE 10/25/2015 0143   Sepsis Labs: '@LABRCNTIP'$ (procalcitonin:4,lacticidven:4) ) Recent Results (from the past 240 hour(s))  Culture, blood (Routine x 2)     Status: None (Preliminary result)   Collection Time: 10/24/15  6:40 PM  Result Value Ref Range Status   Specimen Description BLOOD PORTA CATH  Final   Special Requests BOTTLES DRAWN AEROBIC AND ANAEROBIC 5CC EACH  Final   Culture   Final    NO GROWTH < 24 HOURS Performed at [facility]    Report Status PENDING  Incomplete  Culture, blood (Routine x 2)     Status: None (Preliminary result)   Collection Time: 10/24/15  6:54 PM  Result Value Ref Range Status   Specimen Description BLOOD RIGHT ANTECUBITAL  Final   Special Requests IN PEDIATRIC BOTTLE 2CC  Final   Culture   Final    NO GROWTH < 24 HOURS Performed at [facility]    Report Status PENDING  Incomplete  MRSA  PCR Screening     Status: None   Collection Time: 10/24/15 11:00 PM  Result Value Ref Range Status   MRSA by PCR NEGATIVE NEGATIVE Final    Comment:        The GeneXpert MRSA Assay (FDA approved for NASAL specimens only), is one component of a comprehensive MRSA colonization surveillance program. It is not intended to diagnose MRSA infection nor to guide or monitor treatment for MRSA infections.   Urine culture     Status: None   Collection Time: 10/25/15  1:43 AM  Result Value Ref Range Status   Specimen Description URINE, RANDOM  Final   Special Requests NONE  Final   Culture NO GROWTH Performed at [facility]   Final   Report Status 10/26/2015 FINAL  Final         Radiology Studies: Dg Chest 2 View  10/24/2015  CLINICAL DATA:  Shortness of breath.  Metastatic colon carcinoma. EXAM: CHEST  2 VIEW COMPARISON:  Chest CT August 19, 2015 ; thoracic MRI August 20, 2015 FINDINGS: Extensive pulmonary metastatic disease bilaterally is again noted. Pulmonary nodular lesions range in size from as small as 5 mm to as large as 5 x 4.5 cm. There is no edema or consolidation. Heart size and pulmonary vascularity are normal. No adenopathy evident. Port-A-Cath tip is in the superior vena cava. No pneumothorax. There is collapse of the T6 vertebral body. IMPRESSION: Extensive pulmonary metastatic disease. No edema or consolidation. Cardiac silhouette within normal limits. Pathologic fracture of the T6 vertebral body appears grossly stable. Electronically Signed   By: Lowella Grip III M.D.   On: 10/24/2015 19:47   Abd 1 View (kub)  10/25/2015  CLINICAL DATA:  Nausea for several hours.  Lower abdominal pain. EXAM: ABDOMEN - 1 VIEW COMPARISON:  None. FINDINGS: The bowel gas pattern is normal.  No biliary or urinary calculi. There are skeletal metastases, seen to better advantage on the recent cross-sectional imaging studies. IMPRESSION: Negative for obstruction or perforation.  Electronically Signed   By: Andreas Newport M.D.   On: 10/25/2015 03:10   Ct Head Wo Contrast  10/24/2015  CLINICAL DATA:  Hypertension and weakness. History of metastatic colon cancer. EXAM: CT HEAD WITHOUT CONTRAST TECHNIQUE: Contiguous axial images were obtained from the base of the skull through the vertex without intravenous contrast. COMPARISON:  None. FINDINGS: Brain: No evidence of acute infarction, hemorrhage, extra-axial collection, ventriculomegaly, or mass effect. Mild brain parenchymal volume loss and deep white matter microvascular changes. Vascular: No hyperdense vessel or unexpected calcification. Skull: Negative for fracture or focal lesion. Sinuses/Orbits: No acute findings. Other: None. IMPRESSION: No acute intracranial abnormality. Mild brain parenchymal atrophy and chronic microvascular disease. Electronically Signed   By: Fidela Salisbury M.D.   On: 10/24/2015 19:21      Scheduled Meds: . famotidine (PEPCID) IV  20 mg Intravenous Q12H  . ferrous sulfate  325 mg Oral Q breakfast  . gabapentin  100 mg Oral TID  . hydrocortisone sod succinate (SOLU-CORTEF) inj  100 mg Intravenous Q8H  . piperacillin-tazobactam (ZOSYN)  IV  3.375 g Intravenous Q8H  . sodium chloride flush  3 mL Intravenous Q12H  . vancomycin  1,000 mg Intravenous Q12H   Continuous Infusions:    LOS: 2 days    Time spent in minutes: 22    Kenyotta Dorfman, MD [facility] Pager: [email or web address] Password [email or web address] 10/26/2015, 10:59 AM

## 2015-10-26 NOTE — Evaluation (Signed)
Physical Therapy Evaluation Patient Details Name: [name] MRN: [id number] DOB: 1955-11-19 Today's Date: 10/26/2015   History of Present Illness  60 yo female admitted with colon cancer, L LE pain, weakness, numbness. Hx of colon cancer with bony mets, breast cancer, path fx T6, HTN, neuropathy, spinal stenosis, admitted   Clinical Impression  The patient presents with a significant decline in function since last admit 3 weeks ago. Per [name], patient has not been ambulatory and was transported to  Radiation via [facility].per patient , she has not really stood and ambulated for > week. She reports having HHPT.  The patient's bedroom is on 2nd floor. RECOMMEND HOSPITAL BED FOR FIRST FLOOR. The patient agreed. Pt admitted with above diagnosis. Pt currently with functional limitations due to the deficits listed below (see PT Problem List).  Pt will benefit from skilled PT to increase their independence and safety with mobility to allow discharge to the venue listed below.       Follow Up Recommendations Supervision/Assistance - 24 hour;Home health PT (if eligible with Hospice)    Equipment Recommendations  Hospital bed    Recommendations for Other Services       Precautions / Restrictions Precautions Precautions: Fall Precaution Comments: profound  left leg  weakness      Mobility  Bed Mobility Overal bed mobility: Needs Assistance Bed Mobility: Rolling Rolling: Mod assist         General bed mobility comments: assist with the leg  Transfers Overall transfer level: Needs assistance Equipment used: Rolling walker (2 wheeled)             General transfer comment: Attempted  to stand from  recliner and unsuccessful, attempte to use the SARA plus and patient unable to stand, resorted to [facility].  Ambulation/Gait                Stairs            Wheelchair Mobility    Modified Rankin (Stroke Patients Only)       Balance                                              Pertinent Vitals/Pain Pain Assessment: 0-10 Pain Score: 8  Pain Location: left leg    Home Living Family/patient expects to be discharged to:: Private residence   Available Help at Discharge: Family Type of Home: Apartment Home Access: Level entry     Home Layout: Two level Home Equipment: Environmental consultant - 2 wheels;Bedside commode;Wheelchair - manual      Prior Function                 Hand Dominance        Extremity/Trunk Assessment   Upper Extremity Assessment: Generalized weakness               LLE Deficits / Details: DF 1/, PF at least 2/5 for hip flexion,  knee ext at least 2/5. LE numbness.     Communication      Cognition Arousal/Alertness: Awake/alert Behavior During Therapy: WFL for tasks assessed/performed (emotional about  not being able to stand) Overall Cognitive Status: Within Functional Limits for tasks assessed                      General Comments      Exercises  Assessment/Plan    PT Assessment Patient needs continued PT services  PT Diagnosis Generalized weakness;Acute pain   PT Problem List Decreased strength;Decreased range of motion;Decreased balance;Decreased mobility;Pain;Decreased knowledge of use of DME  PT Treatment Interventions Functional mobility training;Therapeutic activities   PT Goals (Current goals can be found in the Care Plan section) Acute Rehab PT Goals Patient Stated Goal: home, to  find out shy my leg is so weak. PT Goal Formulation: With patient Time For Goal Achievement: 11/09/15 Potential to Achieve Goals: Poor    Frequency Min 2X/week   Barriers to discharge Decreased caregiver support      Co-evaluation               End of Session   Activity Tolerance: Patient tolerated treatment well Patient left: in bed;with call bell/phone within reach;with bed alarm set Nurse Communication: Mobility status;Need for lift equipment         Time:  1620-1700 PT Time Calculation (min) (ACUTE ONLY): 40 min   Charges:   PT Evaluation $PT Eval Moderate Complexity: 1 Procedure PT Treatments $Therapeutic Activity: 23-37 mins   PT G Codes:        Claretha Cooper 10/26/2015, 5:12 PM Tresa Endo PT [phone]

## 2015-10-27 DIAGNOSIS — R339 Retention of urine, unspecified: Secondary | ICD-10-CM

## 2015-10-27 DIAGNOSIS — M79605 Pain in left leg: Secondary | ICD-10-CM

## 2015-10-27 LAB — BASIC METABOLIC PANEL
ANION GAP: 5 (ref 5–15)
BUN: 20 mg/dL (ref 6–20)
CHLORIDE: 105 mmol/L (ref 101–111)
CO2: 26 mmol/L (ref 22–32)
Calcium: 8.5 mg/dL — ABNORMAL LOW (ref 8.9–10.3)
Creatinine, Ser: 0.46 mg/dL (ref 0.44–1.00)
GFR calc non Af Amer: >60 mL/min (ref >60)
Glucose, Bld: 196 mg/dL — ABNORMAL HIGH (ref 65–99)
Potassium: 3.1 mmol/L — ABNORMAL LOW (ref 3.5–5.1)
Sodium: 136 mmol/L (ref 135–145)

## 2015-10-27 LAB — CBC
HEMATOCRIT: 28.3 % — AB (ref 36.0–46.0)
HEMOGLOBIN: 9.5 g/dL — AB (ref 12.0–15.0)
MCH: 28.6 pg (ref 26.0–34.0)
MCHC: 33.6 g/dL (ref 30.0–36.0)
MCV: 85.2 fL (ref 78.0–100.0)
Platelets: 46 10*3/uL — ABNORMAL LOW (ref 150–400)
RBC: 3.32 MIL/uL — AB (ref 3.87–5.11)
RDW: 17.7 % — ABNORMAL HIGH (ref 11.5–15.5)
WBC: 3.6 10*3/uL — AB (ref 4.0–10.5)

## 2015-10-27 MED ORDER — ACETAMINOPHEN 325 MG PO TABS: 650.0000 mg | ORAL_TABLET | Freq: Four times a day (QID) | ORAL | Status: AC | PRN

## 2015-10-27 MED ORDER — POTASSIUM CHLORIDE CRYS ER 20 MEQ PO TBCR
40.0000 meq | EXTENDED_RELEASE_TABLET | ORAL | Status: AC
  Administered 2015-10-27 (×2): 40 meq via ORAL
  Filled 2015-10-27 (×2): qty 2

## 2015-10-27 MED ORDER — GABAPENTIN 100 MG PO CAPS: 100.0000 mg | ORAL_CAPSULE | Freq: Three times a day (TID) | ORAL | Status: AC

## 2015-10-27 MED ORDER — AMOXICILLIN-POT CLAVULANATE 875-125 MG PO TABS: 1.0000 | ORAL_TABLET | Freq: Two times a day (BID) | ORAL | Status: AC

## 2015-10-27 MED ORDER — LOPERAMIDE HCL 2 MG PO CAPS
2.0000 mg | ORAL_CAPSULE | ORAL | Status: DC | PRN
  Administered 2015-10-27 – 2015-10-28 (×3): 2 mg via ORAL
  Filled 2015-10-27 (×3): qty 1

## 2015-10-27 NOTE — Discharge Summary (Addendum)
Physician Discharge Summary  [name] IRJ:[id number] DOB: [birth year] DOA: 10/24/2015  PCP: Lynne Logan, MD  Admit date: 10/24/2015 Discharge date: 10/27/2015  Admitted From: home  Disposition:    Home with Hospice Equipment: hospital bed - already has other necessary equipment  Recommendations for Outpatient Follow-up:  Continue foley catheter for now due to skin breakdown and inability to get out of bed or onto a bedpan.     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 She has also had diarrhea. She was found to be hypotensive in the ER with a temp of 100.4 and tachycardia. She was admitted for sepsis. Oncology, Dr Ammie Dalton had recommended hospice for her but she had declined. He recommended DNR but she states she will think about it further prior to making a decision.   Hospital Course:  Active Problems: Fever (102.1), hypotension (76/34) , diarrhea, lactic acidosis, dehydration- Sepsis - possibly GI related - has received Vanc, Cefepime and Zosyn - diarrhea resolved- abdomen benign - tachycardia improving-fever has not recurred - has been on Decadron- changed to temporarily to Solu-cortef for stress dosing - UA was negative -blood Cultures from 6/29 negative so far- MRSA screen  negative  - stopped Vanc and Zosyn and switched to Augmentin to cover GI infection as she had no other symptoms related to other organ systems   U retention - apparently she was able to ambulate to the bathroom prior to admission - found to have retention in the hospital and was unable to even get to a bedside commode - 800 cc on I and O cath 2 AM on 6/29  - > 650 cc U retention subsequently found on bladder scan l  - foley placed- about 700 cc urine emptied.  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 She is a 2 person assist to the chair. She wants to go back home and family initially did not initially want to take her home. She refuses to go to a facility. Today, the family is willing to take her  home.     Discharge Instructions      Discharge Instructions    Discharge instructions    Complete by:  As directed   Continue foley catheter for now due to skin breakdown and inability to get out of bed or onto a bedpan. Regular diet     Increase activity slowly    Complete by:  As directed             Medication List    STOP taking these medications        amLODipine 10 MG tablet  Commonly known as:  NORVASC     hydrochlorothiazide 12.5 MG capsule  Commonly known as:  MICROZIDE       TAKE these medications        acetaminophen 325 MG tablet  Commonly known as:  TYLENOL  Take 2 tablets (650 mg total) by mouth every 6 (six) hours as needed for mild pain (or Fever >/= 101).     amoxicillin-clavulanate 875-125 MG tablet  Commonly known as:  AUGMENTIN  Take 1 tablet by mouth every 12 (twelve) hours.     dexamethasone 4 MG tablet  Commonly known as:  DECADRON  Take 1 tablet (4 mg total) by mouth 2 (two) times daily.     ferrous sulfate 325 (65 FE) MG tablet  Take 325 mg by mouth daily with breakfast.     gabapentin 100 MG capsule  Commonly known as:  NEURONTIN  Take 1 capsule (100 mg total) by mouth 3 (three) times daily.     HYDROmorphone 4 MG tablet  Commonly known as:  DILAUDID  TAKE 1-2 TABLET BY MOUTH EVERY 4 HOURS AS NEEDED FOR MODERATE OR SEVERE PAIN     lidocaine-prilocaine cream  Commonly known as:  EMLA  Apply topically as needed. Apply to [facility] site 1-2 hours prior to stick and cover with plastic wrap to numb site     loperamide 2 MG capsule  Commonly known as:  IMODIUM  TAKE ONE CAPSULE BY MOUTH AS NEEDED FOR DIARRHEA OR LOOSE STOOLS. MAX OF 8 CAPSULES PER DAY     LORazepam 0.5 MG tablet  Commonly known as:  ATIVAN  Place 1 tablet (0.5 mg total) under the tongue every 6 (six) hours as needed (for nausea). Causes drowsiness     multivitamin tablet  Take 1 tablet by mouth daily.     ondansetron 8 MG tablet  Commonly known as:  ZOFRAN  TAKE 1 TABLET BY MOUTH EVERY 8 HOURS AS NEEDED FOR NAUSEA AND VOMITING     polyethylene glycol powder powder  Commonly known as:  MIRALAX  Take 17 g by mouth daily as needed.     potassium chloride SA 20 MEQ tablet  Commonly known as:  K-DUR,KLOR-CON  TAKE 1 TABLET BY MOUTH DAILY     prochlorperazine 10 MG tablet  Commonly known as:  COMPAZINE  TAKE 1 TABLET BY MOUTH EVERY 6 HOURS AS NEEDED FOR NAUSEA OR VOMITING        No Known Allergies   Procedures/Studies:    Dg Chest 2 View  10/24/2015   CLINICAL DATA:  Shortness of breath.  Metastatic colon carcinoma. EXAM: CHEST  2 VIEW COMPARISON:  Chest CT August 19, 2015 ; thoracic MRI August 20, 2015 FINDINGS: Extensive pulmonary metastatic disease bilaterally is again noted. Pulmonary nodular lesions range in size from as small as 5 mm to as large as 5 x 4.5 cm. There is no edema or consolidation. Heart  size and pulmonary vascularity are normal. No adenopathy evident. Port-A-Cath tip is in the superior vena cava. No pneumothorax. There is collapse of the T6 vertebral body. IMPRESSION: Extensive pulmonary metastatic disease. No edema or consolidation. Cardiac silhouette within normal limits. Pathologic fracture of the T6 vertebral body appears grossly stable. Electronically Signed   By: Lowella Grip III M.D.   On: 10/24/2015 19:47   Abd 1 View (kub)  10/25/2015  CLINICAL DATA:  Nausea for several hours.  Lower abdominal pain. EXAM: ABDOMEN - 1 VIEW COMPARISON:  None. FINDINGS: The bowel gas pattern is normal. No biliary or urinary calculi. There are skeletal metastases, seen to better advantage on the recent cross-sectional imaging studies. IMPRESSION: Negative for obstruction or perforation. Electronically Signed   By: Andreas Newport M.D.   On: 10/25/2015 03:10   Ct Head Wo Contrast  10/24/2015  CLINICAL DATA:  Hypertension and weakness. History of metastatic colon cancer. EXAM: CT HEAD WITHOUT CONTRAST TECHNIQUE: Contiguous axial images were obtained from the base of the skull through the vertex without intravenous contrast. COMPARISON:  None. FINDINGS: Brain: No evidence of acute infarction, hemorrhage, extra-axial collection, ventriculomegaly, or mass effect. Mild brain parenchymal volume loss and deep white matter microvascular changes. Vascular: No hyperdense vessel or unexpected calcification. Skull: Negative for fracture or focal lesion. Sinuses/Orbits: No acute findings. Other: None. IMPRESSION: No acute intracranial abnormality. Mild  brain parenchymal atrophy and chronic microvascular disease. Electronically Signed   By: Fidela Salisbury M.D.   On: 10/24/2015 19:21   Mr Lumbar Spine Wo Contrast  09/30/2015  CLINICAL DATA:  Left lower extremity pain and numbness, 2 weeks duration. Patient with history of metastatic cancer. Colon carcinoma. Being treated with chemotherapy. EXAM: MRI LUMBAR SPINE WITHOUT CONTRAST TECHNIQUE: Multiplanar, multisequence MR imaging of the lumbar spine was performed. No intravenous contrast was administered. COMPARISON:  Radiography 08/03/2015.  CT abdomen 08/04/2015. FINDINGS: Segmentation:  S1 is transitional. Alignment:  Normal Vertebrae: There is a metastatic lesion within the central portion of the L3 vertebra measuring 2.7 cm in diameter. Minimal superior endplate depression probably secondary to this. No extraosseous tumor or any encroachment upon the nerves or neural spaces. There is tumor involvement within the right pedicle and transverse process at L5. More extensive tumor involvement is seen affecting the transitional S1 segment, particularly along the left side with extension into the left pedicle and posterior elements. There may be minimal ventral epidural tumor but this does not appear to affect the neural structures. Tumor is also present extensively within the S2 segment on the left. Left sacral ala as extensively involved with tumor. I do not demonstrate any compressive effect upon the sacral nerve roots, but there is mild encroachment upon the left sacral foramina that could possibly be symptomatic. Conus medullaris: Extends to the L2-3 level and appears normal. Paraspinal and other soft tissues: Negative Disc levels: T12-L1 and L1-2 are normal. L2-3: Minimal bulging of the disc. No stenosis or neural compression. Minimal facet hypertrophy. L3-4:  No disc pathology.  Minimal facet hypertrophy.  No stenosis. L4-5: Facet degeneration with 1 mm of anterolisthesis. Minimal bulging of the disc. No  stenosis. L5-S1: Minimal bulging of the disc. Mild facet hypertrophy. No stenosis. IMPRESSION: No spinal stenosis or degenerative disc disease of significance. Lower lumbar facet osteoarthritis, mild to moderate in degree, which could be associated with low back pain or facet syndrome pain. Osseous metastatic disease demonstrated affecting the L3 vertebral body, L5 posterior elements on the right, and extensively involving the S1  and S2 levels on the left. There is minimal ventral epidural tumor at S1. There is mild tumor encroachment upon the intervertebral foramina in the sacral region on the left. Definite neural compression is not demonstrated, but the left-sided sacral nerve roots could be affected or irritated. Electronically Signed   By: Nelson Chimes M.D.   On: 09/30/2015 13:30       Discharge Exam: Filed Vitals:   10/27/15 1200 10/27/15 1230  BP: 127/84 138/78  Pulse:  103  Temp:  98.9 F (37.2 C)  Resp: 18 18   Filed Vitals:   10/27/15 1000 10/27/15 1100 10/27/15 1200 10/27/15 1230  BP:   127/84 138/78  Pulse:    103  Temp:    98.9 F (37.2 C)  TempSrc:    Oral  Resp: '11 14 18 18  '$ Height:      Weight:      SpO2: 94% 94% 94% 96%    General: Pt is alert, awake, not in acute distress Cardiovascular: RRR, S1/S2 +, no rubs, no gallops Respiratory: CTA bilaterally, no wheezing, no rhonchi Abdominal: Soft, NT, ND, bowel sounds + Extremities: no edema, no cyanosis    The results of significant diagnostics from this hospitalization (including imaging, microbiology, ancillary and laboratory) are listed below for reference.     Microbiology: Recent Results (from the past 240 hour(s))  Culture, blood (Routine x 2)     Status: None (Preliminary result)   Collection Time: 10/24/15  6:40 PM  Result Value Ref Range Status   Specimen Description BLOOD PORTA CATH  Final   Special Requests BOTTLES DRAWN AEROBIC AND ANAEROBIC 5CC EACH  Final   Culture   Final    NO GROWTH 2  DAYS Performed at [facility]    Report Status PENDING  Incomplete  Culture, blood (Routine x 2)     Status: None (Preliminary result)   Collection Time: 10/24/15  6:54 PM  Result Value Ref Range Status   Specimen Description BLOOD RIGHT ANTECUBITAL  Final   Special Requests IN PEDIATRIC BOTTLE 2CC  Final   Culture   Final    NO GROWTH 2 DAYS Performed at [facility]    Report Status PENDING  Incomplete  MRSA PCR Screening     Status: None   Collection Time: 10/24/15 11:00 PM  Result Value Ref Range Status   MRSA by PCR NEGATIVE NEGATIVE Final    Comment:        The GeneXpert MRSA Assay (FDA approved for NASAL specimens only), is one component of a comprehensive MRSA colonization surveillance program. It is not intended to diagnose MRSA infection nor to guide or monitor treatment for MRSA infections.   Urine culture     Status: None   Collection Time: 10/25/15  1:43 AM  Result Value Ref Range Status   Specimen Description URINE, RANDOM  Final   Special Requests NONE  Final   Culture NO GROWTH Performed at [facility]   Final   Report Status 10/26/2015 FINAL  Final     Labs: BNP (last 3 results) No results for input(s): BNP in the last 8760 hours. Basic Metabolic Panel:  Recent Labs Lab 10/24/15 1853 10/25/15 0430 10/26/15 1200 10/27/15 0500  NA 135 136 136 136  K 3.6 3.3* 2.9* 3.1*  CL 101 107 106 105  CO2 '24 23 22 26  '$ GLUCOSE 117* 120* 195* 196*  BUN 29* 25* 16 20  CREATININE 0.70 0.61 0.54 0.46  CALCIUM 9.2 8.1* 8.0* 8.5*  MG  --  1.7  --   --   PHOS  --  3.4  --   --    Liver Function Tests:  Recent Labs Lab 10/24/15 1853 10/25/15 0430  AST 48* 44*  ALT 72* 58*  ALKPHOS 100 83  BILITOT 1.0 1.3*  PROT 5.6* 4.7*  ALBUMIN 3.0* 2.6*   No results for input(s): LIPASE, AMYLASE in the last 168 hours. No results for input(s): AMMONIA in the last 168 hours. CBC:  Recent Labs Lab 10/24/15 1853 10/24/15 2105  10/25/15 0430 10/26/15 1200 10/27/15 0500  WBC 5.3  --  4.6 3.8* 3.6*  NEUTROABS 4.9  --   --   --   --   HGB 12.0  --  10.7* 9.2* 9.5*  HCT 36.3  --  31.6* 27.4* 28.3*  MCV 85.4  --  85.9 85.1 85.2  PLT 63* 59* 50* 48* 46*   Cardiac Enzymes: No results for input(s): CKTOTAL, CKMB, CKMBINDEX, TROPONINI in the last 168 hours. BNP: Invalid input(s): POCBNP CBG: No results for input(s): GLUCAP in the last 168 hours. D-Dimer  Recent Labs  10/24/15 2105  DDIMER 2.62*   Hgb A1c No results for input(s): HGBA1C in the last 72 hours. Lipid Profile No results for input(s): CHOL, HDL, LDLCALC, TRIG, CHOLHDL, LDLDIRECT in the last 72 hours. Thyroid function studies  Recent Labs  10/25/15 0430  TSH 0.874   Anemia work up No results for input(s): VITAMINB12, FOLATE, FERRITIN, TIBC, IRON, RETICCTPCT in the last 72 hours. Urinalysis    Component Value Date/Time   COLORURINE YELLOW 10/25/2015 0143   APPEARANCEUR CLOUDY* 10/25/2015 0143   LABSPEC 1.029 10/25/2015 0143   PHURINE 5.0 10/25/2015 0143   GLUCOSEU NEGATIVE 10/25/2015 0143   GLUCOSEU NEG mg/dL 06/01/2006 2232   HGBUR NEGATIVE 10/25/2015 0143   BILIRUBINUR NEGATIVE 10/25/2015 0143   KETONESUR NEGATIVE 10/25/2015 0143   PROTEINUR NEGATIVE 10/25/2015 0143   UROBILINOGEN 0.2 04/07/2012 0948   NITRITE NEGATIVE 10/25/2015 0143   LEUKOCYTESUR NEGATIVE 10/25/2015 0143   Sepsis Labs Invalid input(s): PROCALCITONIN,  WBC,  LACTICIDVEN Microbiology Recent Results (from the past 240 hour(s))  Culture, blood (Routine x 2)     Status: None (Preliminary result)   Collection Time: 10/24/15  6:40 PM  Result Value Ref Range Status   Specimen Description BLOOD PORTA CATH  Final   Special Requests BOTTLES DRAWN AEROBIC AND ANAEROBIC 5CC EACH  Final   Culture   Final    NO GROWTH 2 DAYS Performed at [facility]    Report Status PENDING  Incomplete  Culture, blood (Routine x 2)     Status: None (Preliminary result)    Collection Time: 10/24/15  6:54 PM  Result Value Ref Range Status   Specimen Description BLOOD RIGHT ANTECUBITAL  Final   Special Requests IN PEDIATRIC BOTTLE [place]  Final   Culture   Final    NO GROWTH 2 DAYS Performed at [facility]    Report Status PENDING  Incomplete  MRSA PCR Screening     Status: None   Collection Time: 10/24/15 11:00 PM  Result Value Ref Range Status   MRSA by PCR NEGATIVE NEGATIVE Final    Comment:        The GeneXpert MRSA Assay (FDA approved for NASAL specimens only), is one component of a comprehensive MRSA colonization surveillance program. It is not intended to diagnose MRSA infection nor to guide or monitor treatment for  MRSA infections.   Urine culture     Status: None   Collection Time: 10/25/15  1:43 AM  Result Value Ref Range Status   Specimen Description URINE, RANDOM  Final   Special Requests NONE  Final   Culture NO GROWTH Performed at [facility]   Final   Report Status 10/26/2015 FINAL  Final     Time coordinating discharge: Over 30 minutes  SIGNED:   Debbe Odea, MD  [facility] 10/27/2015, 3:08 PM Pager   If 7PM-7AM, please contact night-coverage [email or web address] Password [email or web address]

## 2015-10-27 NOTE — Progress Notes (Signed)
Pt was lying in bed and awake/alert when I arrived. She talked her reason for being here, "drop foot". She tearfully shared how of her initial concern was when people were talking to her about dying in ED. She said she was also concerned in ED with the attention. We discussed how in ICU that is protocol and acknowledged how she is on a different floor now. CH offered encouragement and emotional support. She asked for me to pray for her. Pt had a good sense of humor and past her concerns we were able to share laughter as I listened to some of her stories. Please page if additional support is needed.  Chaplain Ernest Haber, M.Div.   10/27/15 1600  Clinical Encounter Type  Visited With Patient

## 2015-10-28 DIAGNOSIS — R110 Nausea: Secondary | ICD-10-CM | POA: Insufficient documentation

## 2015-10-28 DIAGNOSIS — Z515 Encounter for palliative care: Secondary | ICD-10-CM

## 2015-10-28 DIAGNOSIS — L8990 Pressure ulcer of unspecified site, unspecified stage: Secondary | ICD-10-CM

## 2015-10-28 LAB — BASIC METABOLIC PANEL
ANION GAP: 7 (ref 5–15)
BUN: 17 mg/dL (ref 6–20)
CHLORIDE: 106 mmol/L (ref 101–111)
CO2: 25 mmol/L (ref 22–32)
Calcium: 8.4 mg/dL — ABNORMAL LOW (ref 8.9–10.3)
Creatinine, Ser: 0.47 mg/dL (ref 0.44–1.00)
GFR calc non Af Amer: >60 mL/min (ref >60)
GLUCOSE: 173 mg/dL — AB (ref 65–99)
POTASSIUM: 3.5 mmol/L (ref 3.5–5.1)
Sodium: 138 mmol/L (ref 135–145)

## 2015-10-28 MED ORDER — HEPARIN SOD (PORK) LOCK FLUSH 100 UNIT/ML IV SOLN
500.0000 [IU] | INTRAVENOUS | Status: AC | PRN
  Administered 2015-10-28: 500 [IU]
  Filled 2015-10-28: qty 5

## 2015-10-28 MED ORDER — LOPERAMIDE HCL 2 MG PO CAPS: 2.0000 mg | ORAL_CAPSULE | ORAL | Status: AC | PRN

## 2015-10-28 MED FILL — GABAPENTIN 100 MG CAPSULE: 100 | 30 days supply | Qty: 90 | Fill #0

## 2015-10-28 MED FILL — AMOX-CLAV 875-125 MG TABLET: 875-125 | 5 days supply | Qty: 10 | Fill #0

## 2015-10-28 NOTE — Progress Notes (Signed)
Patient d/c home via ambulance. Denies pain. Stable.

## 2015-10-28 NOTE — Progress Notes (Signed)
Physical Therapy Treatment Patient Details Name: [name] MRN: [id number] DOB: 28-Apr-1955 Today's Date: 10/28/2015    History of Present Illness 60 yo female admitted with colon cancer, L LE pain, weakness, numbness. Hx of colon cancer with bony mets, breast cancer, path fx T6, HTN, neuropathy, spinal stenosis, admitted     PT Comments    Performed side to side rolling for hygiene (loose stools) at [place] mainly for B LE.  Pt utilizes bed rails to complete.  Used Maxi Move to assist pt OOB to recliner due to profound B LE weakness.  Positioned in recliner upright with B LE elevated.    Follow Up Recommendations  Supervision/Assistance - 24 hour;Home health PT     Equipment Recommendations  Hospital bed    Recommendations for Other Services       Precautions / Restrictions Precautions Precautions: Fall Precaution Comments: profound  left leg  weakness Restrictions Weight Bearing Restrictions: No    Mobility  Bed Mobility Overal bed mobility: Needs Assistance Bed Mobility: Rolling Rolling: Mod assist         General bed mobility comments: assist with B LE's.  Pt utilizes bed rails.  Able to roll side to side for hygiene and to place [place] pad under.    Transfers Overall transfer level: Needs assistance               General transfer comment: used Maxi Move to assist pt OOB to recliner and positioned in recliner upright with B LE elevated.    Ambulation/Gait             General Gait Details: unable to attempt due to profound B LE weakness   Stairs            Wheelchair Mobility    Modified Rankin (Stroke Patients Only)       Balance                                    Cognition Arousal/Alertness: Awake/alert Behavior During Therapy: WFL for tasks assessed/performed Overall Cognitive Status: Within Functional Limits for tasks assessed                      Exercises      General Comments         Pertinent Vitals/Pain Pain Assessment: No/denies pain    Home Living                      Prior Function            PT Goals (current goals can now be found in the care plan section) Progress towards PT goals: Progressing toward goals    Frequency  Min 2X/week    PT Plan Current plan remains appropriate    Co-evaluation             End of Session   Activity Tolerance: Patient tolerated treatment well Patient left: in chair;with call bell/phone within reach;with chair alarm set     Time: 5361-4431 PT Time Calculation (min) (ACUTE ONLY): 29 min  Charges:  $Therapeutic Activity: 23-37 mins                    G Codes:      Rica Koyanagi  PTA [facility]  Acute  Rehab Pager      [phone]

## 2015-10-28 NOTE — Progress Notes (Signed)
Patient's d/c instructions rendered, verbalized understanding. Prescription give, Poratcath deaccessed per protocol, site unremarkable.

## 2015-10-28 NOTE — Progress Notes (Signed)
Pt for transport home via [place]. [facility] called and transport scheduled for pickup at 3pm. Medical transport papers placed on front of chart. Son and RN made aware. Marney Doctor RN,BSN,NCM [phone]

## 2015-10-28 NOTE — Progress Notes (Addendum)
[facility] Liaison Note  Chart reviewed from weekend. Request for hospital bed confirmed with patient and son [name] this morning. Hospital bed ordered from [place], son [name] at home waiting for call from [facility] [place] to coordinate delivery. [facility] will continue to follow until discharge date determined.   Upon discharge please: Send scripts for comfort medications with patient. Fax discharge summary to [phone]. Make [facility] aware of discharge by calling [phone].  Thank you,  Erling Conte, LCSW [phone]

## 2015-10-29 LAB — CULTURE, BLOOD (ROUTINE X 2)
CULTURE: NO GROWTH
CULTURE: NO GROWTH

## 2015-11-04 ENCOUNTER — Telehealth: Payer: Self-pay | Admitting: *Deleted

## 2015-11-04 ENCOUNTER — Ambulatory Visit: Payer: Medicare Other | Admitting: Nurse Practitioner

## 2015-11-04 NOTE — Telephone Encounter (Signed)
Message from Thurston Pounds, Hospice RN reporting respiratory distress and lethargy. SpO2 50% on room air. RN placed face mask at 10L and nasal cannula at 5L. Sats up to 88% RN was told by family that a [facility] provider was planning to come out to pt's home to see her. Asking to clarify. Attempted to return call to [facility].  Number left on voicemail goes to a generic answering machine. Left message on pt's voicemail for pt/ family to call office.  Was able to obtain hospice RN's  phone #, [phone]: Stanton Kidney reports SpO2 is up to 95% on 15L. Shallow respirations in the 40s. Per Stanton Kidney, pt has stated she does not want chest compressions or to be placed on a ventilator but family is hesitant to make her DNR. Pt remains full code.  Stanton Kidney reports pt "subjectively does not feel short of breath." RN discussed with Dr. Tomasa Hosteller, Hospice MD: Pt to take Ativan and Dilaudid if she becomes "air hungry." Informed Stanton Kidney that family must have misunderstood when told hospice nurse will come out to see her. Dr. Benay Spice made aware of above: Hospice to reinforce teaching with family, discuss [facility]. MD will be glad to speak with family via phone.  Left message informing Stanton Kidney.

## 2015-11-05 ENCOUNTER — Telehealth: Payer: Self-pay | Admitting: *Deleted

## 2015-11-05 NOTE — Telephone Encounter (Signed)
Left VM for nurse w/Hospice to call with update on her status..is she comfortable now?

## 2015-11-05 NOTE — Telephone Encounter (Addendum)
Left message on voicemail at pt's home. MD requesting update on pt's status.  [place], Hospice RN: SpO2 at 76% while eating today with nasal cannula at 5L. SpO2 up to 96 with mask. Shallow espirations 36-41 today.Lungs are clear but diminished on R. No signs of UTI.  Pt is lethargic and has significant pain but is not taking pain med to avoid drowsiness. [name] reports patient refuses turning/ positioning due to pain.  Pt was discharged from hospital on Augmentin but has not been able to take that.

## 2015-11-05 NOTE — Telephone Encounter (Signed)
Called pt's son [name] [phone]) he states his mother is comfortable at home. He notices she seems a little confused and forgetful, asked if this is related to low oxygen level. Informed him it may be. Pt is bed bound and he is feeding her meals. Per [name], pt is not eating much.  [name] reports pt wants to be at home "in case something happens." Friends and family have been coming by to see her and she's speaking with them. He maintains pt will remain full code at this time. Discussed option of taking pt to ED. Son declined.  Dr. Benay Spice spoke with pt's son, discussed prognosis and code status. Encouraged family to call us or hospice as needed.

## 2015-11-07 ENCOUNTER — Telehealth: Payer: Self-pay | Admitting: *Deleted

## 2015-11-07 NOTE — Telephone Encounter (Signed)
Call to home # to check on status of [name]. Daughter answered phone and reports she is sleeping currently. Just took her pain med and is resting. Says her pain is under better control and her breathing is improved now. She will let patient know that navigator called to check on her.

## 2015-11-07 NOTE — Progress Notes (Incomplete)
°  Radiation Oncology         [phone] ________________________________  Name: [name] MRN: [id number]  Date: 10/21/2015  DOB: Sep 25, 1955  End of Treatment Note  Diagnosis:       ICD-9-CM ICD-10-CM   1. Bone metastasis ([place]) 198.5 C79.51              Indication for treatment:  Curative       Radiation treatment dates:   10/03/2015 to 10/21/2015  Site/dose:  The lumbar spine / sacrum was treated to 30 Gy in 10 fractions at 3 Gy per fraction.  Beams/energy:   3D // 10X, 15X  Narrative: The patient tolerated radiation treatment relatively well.   The patient experienced fatigue with treatment.   Plan: The patient has completed radiation treatment. The patient will return to radiation oncology clinic for routine followup in one month. I advised them to call or return sooner if they have any questions or concerns related to their recovery or treatment.  ------------------------------------------------  Jodelle Gross, MD, PhD  This document serves as a record of services personally performed by Kyung Rudd, MD. It was created on his behalf by Arlyce Harman, a trained medical scribe. The creation of this record is based on the scribe's personal observations and the provider's statements to them. This document has been checked and approved by the attending provider.

## 2015-11-12 ENCOUNTER — Telehealth: Payer: Self-pay | Admitting: *Deleted

## 2015-11-12 ENCOUNTER — Telehealth: Payer: Self-pay

## 2015-11-12 NOTE — Telephone Encounter (Signed)
Pt requested a call back Pt did not answer, lvm for her to call again.

## 2015-11-12 NOTE — Telephone Encounter (Signed)
Message from [place], [place] RN reporting pt is doing better this week. They have weaned her O2 down to 5.5L via nasal cannula. Pt remains bed bound but decubitis sores are being dressed, healing, now look like large excoriations.Per Stanton Kidney, it is unclear what was going on last week but pt appears to have recovered. Dr. Benay Spice made aware of update.

## 2015-11-14 ENCOUNTER — Telehealth: Payer: Self-pay | Admitting: *Deleted

## 2015-11-14 NOTE — Telephone Encounter (Signed)
Received vm call from [facility] stating that pt is c/o burning & has large amt of sediment in f/c & urine is cloudy.  She had changed f/c & is asking for order for u/a & cult.   Called Mary back & informed OK for these orders per Dr Benay Spice.

## 2015-11-16 NOTE — Progress Notes (Signed)
  Radiation Oncology         [phone] ________________________________  Name: Marialy Urbanczyk MRN: [id number]  Date: 10/03/2015  DOB: November 06, 1955  SIMULATION AND TREATMENT PLANNING NOTE  DIAGNOSIS:     ICD-9-CM ICD-10-CM   1. 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  Identity was confirmed.  All relevant records and images related to the planned course of therapy were reviewed.   Written consent to proceed with treatment was confirmed which was freely given after reviewing the details related to the planned course of therapy had been reviewed with the patient.  Then, the patient was set-up in a stable reproducible  supine position for radiation therapy.  CT images were obtained.  Surface markings were placed.    Medically necessary complex treatment device(s) for immobilization:  Customized vac lock bag.   The CT images were loaded into the planning software.  Then the target and avoidance structures were contoured.  Treatment planning then occurred.  The radiation prescription was entered and confirmed.  A total of 3 complex treatment devices were fabricated which relate to the designed radiation treatment fields. Each of these customized fields/ complex treatment devices will be used on a daily basis during the radiation course. I have requested : 3D Simulation  I have requested a DVH of the following structures: Target volume, bowels, left kidney, right kidney.   The patient will undergo daily image guidance to ensure accurate localization of the target, and adequate minimize dose to the normal surrounding structures in close proximity to the target.   PLAN:  The patient will receive 30 Gy in 10 fractions.  ________________________________   Jodelle Gross, MD, PhD

## 2015-11-16 NOTE — Addendum Note (Signed)
Encounter addended by: Kyung Rudd, MD on: 11/16/2015  9:05 PM<BR>     Documentation filed: Notes Section, Visit Diagnoses

## 2015-11-25 ENCOUNTER — Telehealth: Payer: Self-pay | Admitting: *Deleted

## 2015-11-25 NOTE — Telephone Encounter (Signed)
Message from [place] Pounds (sp?), Hospice RN reporting pt continues to decline. She is no longer moving her lower extremities. Pocketing foods when fed. Family is determined to keep her at home, they are more amenable to end of life discussions.  Dr. Benay Spice made aware of update.

## 2015-11-28 ENCOUNTER — Telehealth: Payer: Self-pay | Admitting: *Deleted

## 2015-11-28 NOTE — Telephone Encounter (Signed)
Signed death certificate returned to HIM dept.

## 2015-12-02 ENCOUNTER — Inpatient Hospital Stay: Admission: RE | Admit: 2015-12-02 | Payer: Self-pay | Source: Ambulatory Visit | Admitting: Radiation Oncology

## 2015-12-27 DEATH — deceased

## 2017-08-30 IMAGING — CT CT RENAL STONE PROTOCOL
2 of 3 series · 16 of 46 positions shown, 18 images · non-contrast
Comparison: 06/10/2015

CLINICAL DATA: Worsening back pain.

EXAM:
CT ABDOMEN AND PELVIS WITHOUT CONTRAST
TECHNIQUE: Multidetector CT imaging of the abdomen and pelvis was performed
following the standard protocol without IV contrast.

[Series 3: coronal · coronal · 0.89mm/px · 3 of 169 slices shown]
[im 57/169  soft-tissue]
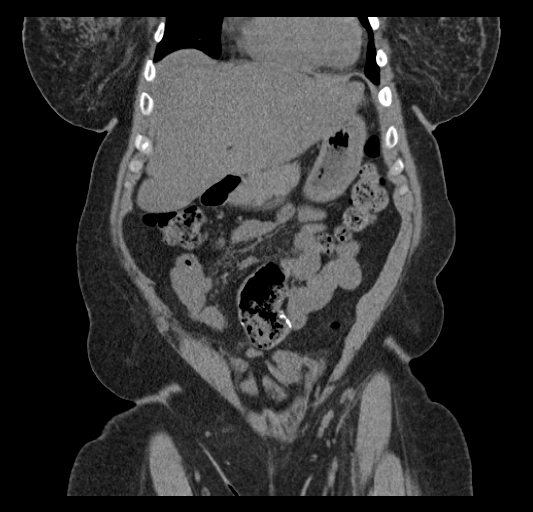
[im 75/169  soft-tissue]
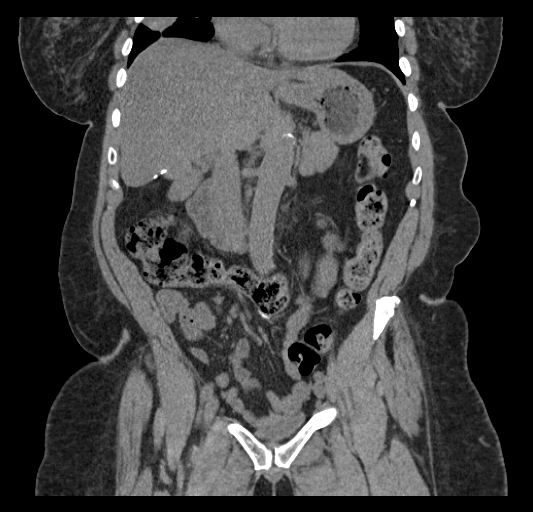
[im 94/169  soft-tissue]
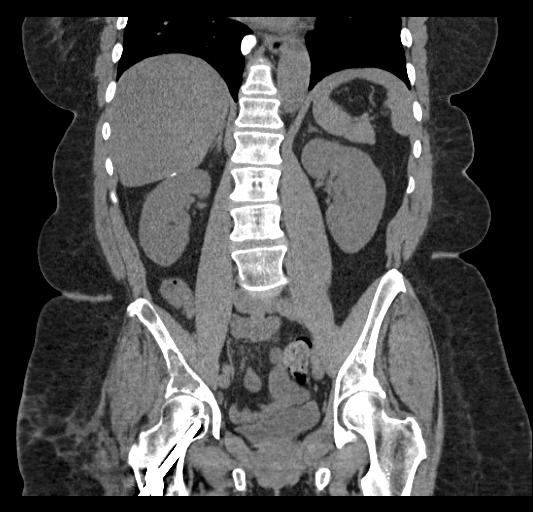

[Series 6: lung · axial · 0.82mm/px · z∈[+1914,+2010]mm · 13 of 38 slices shown, 15 images]
[im 3/38  soft-tissue]
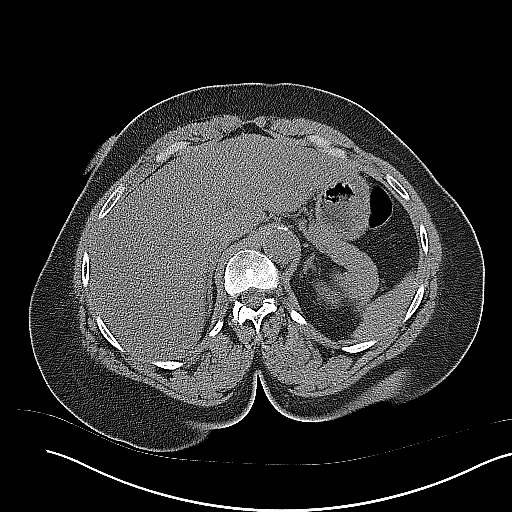
[im 3/38  bone]
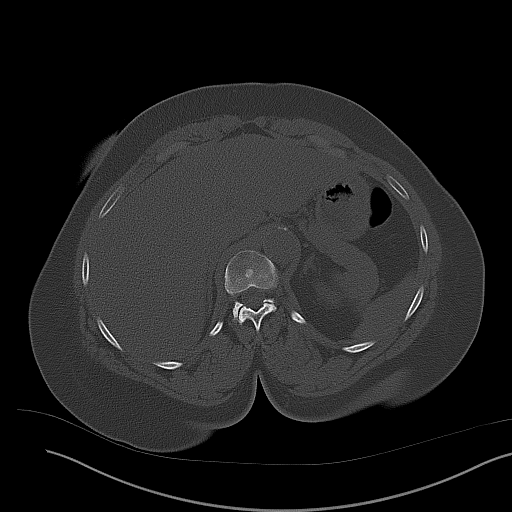
[im 5/38  soft-tissue]
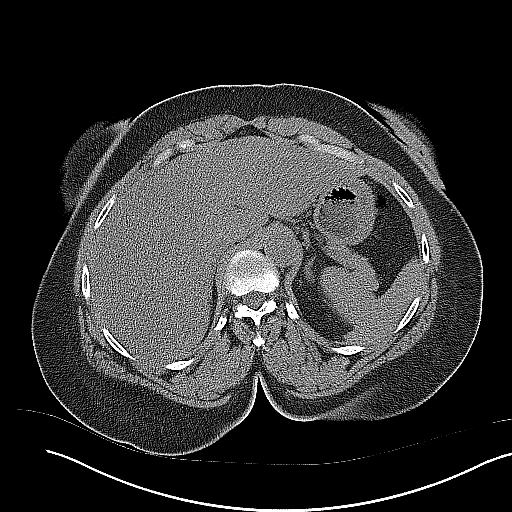
[im 8/38  soft-tissue]
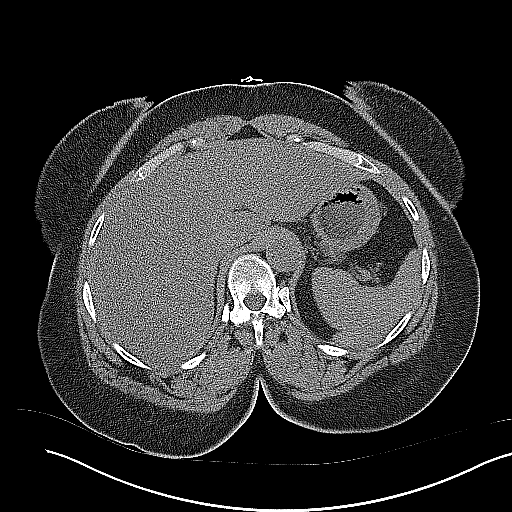
[im 11/38  soft-tissue]
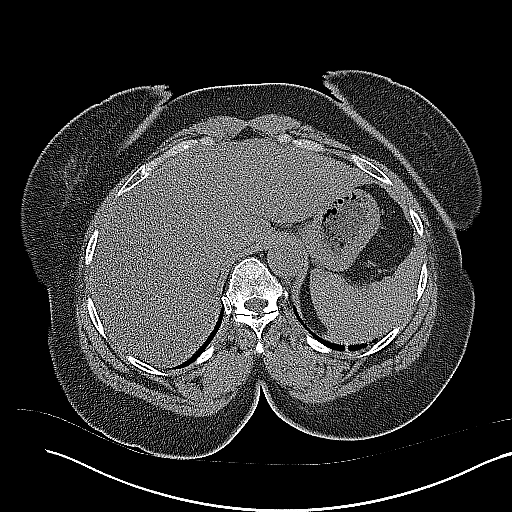
[im 14/38  soft-tissue]
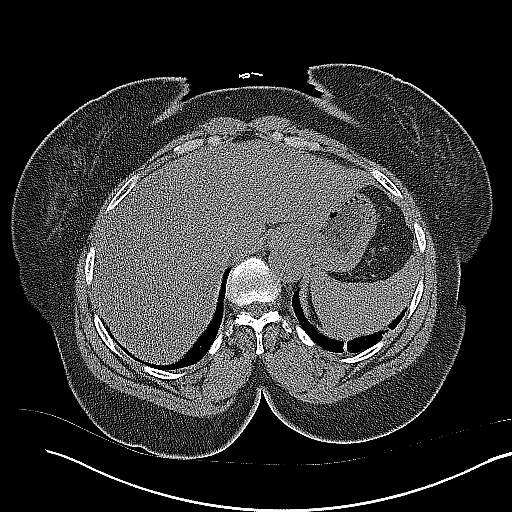
[im 16/38  soft-tissue]
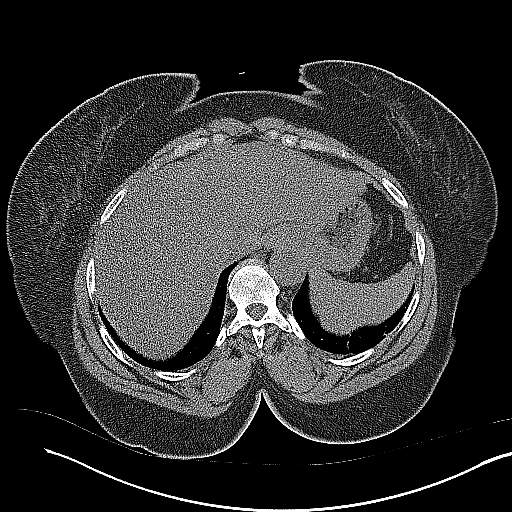
[im 20/38  soft-tissue]
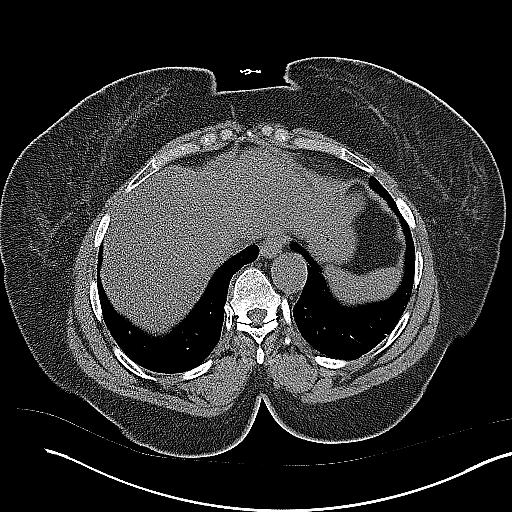
[im 22/38  soft-tissue]
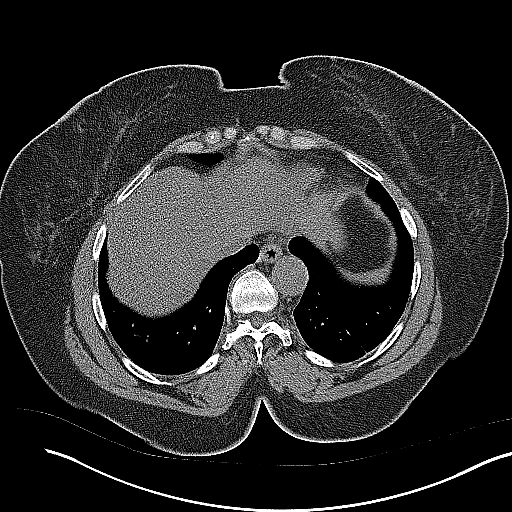
[im 24/38  soft-tissue]
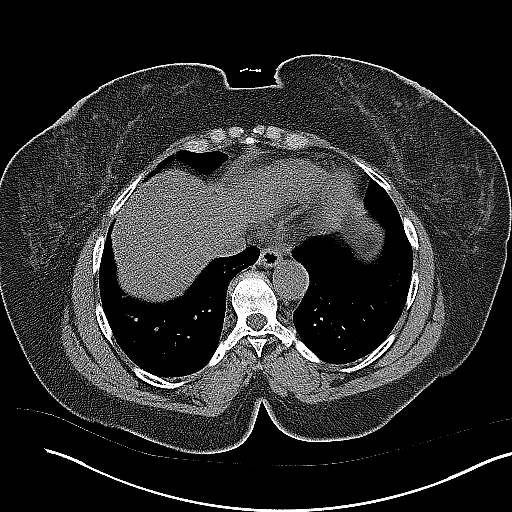
[im 24/38  bone]
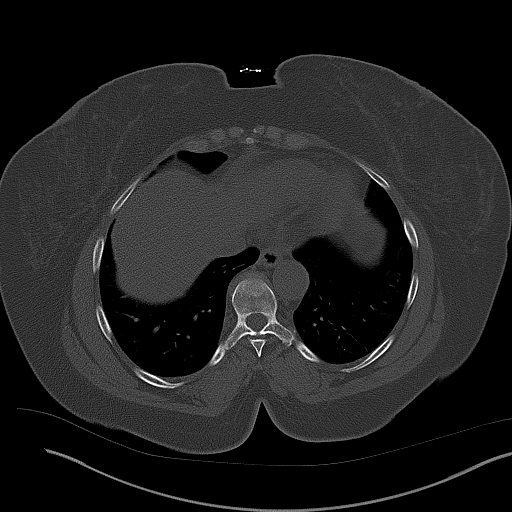
[im 27/38  soft-tissue]
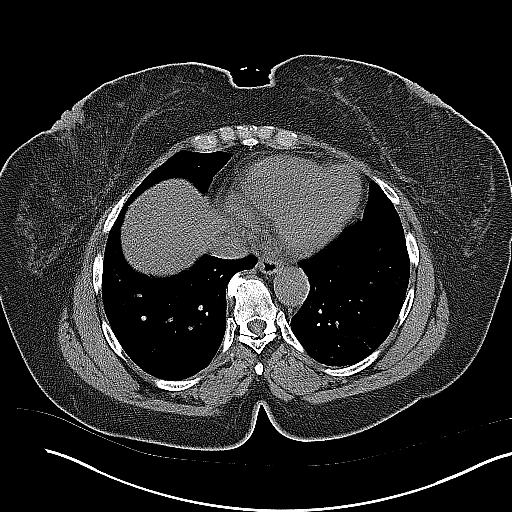
[im 30/38  soft-tissue]
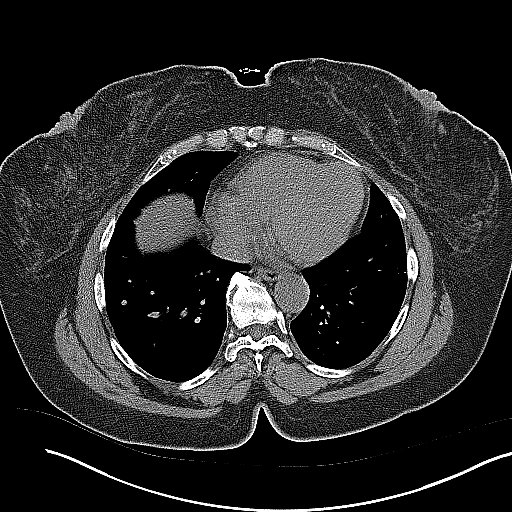
[im 33/38  soft-tissue]
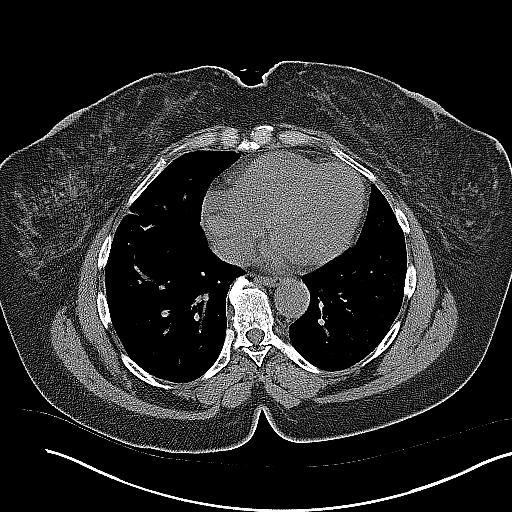
[im 35/38  soft-tissue]
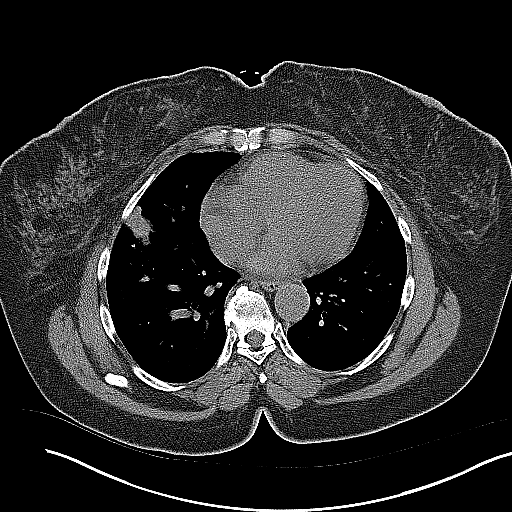

[16 of 46 positions shown; findings below may reference images not displayed]

FINDINGS: Lower chest: There are new pulmonary nodules in the lung bases,
numbering at least 4 in the right lower lobe posteriorly and at
least 1 on the left. These measure up to 7 mm, and likely represent
new hematogenous metastases. The right middle lobe mass is
incompletely imaged on this study.

Hepatobiliary: Stable morphologic irregularities of the liver
consistent with partial hepatectomy. No focal liver lesions are
evident on this unenhanced scan.

Pancreas: Negative for significant abnormality.

Spleen: Negative significant abnormality.

Adrenals/Urinary Tract: Adrenals and kidneys appear unremarkable. No
urinary calculi. Ureters and urinary bladder are unremarkable.

Stomach/Bowel: Colonic resections with unremarkable appearances of
the anastomoses. Small bowel is unremarkable. Stomach is
unremarkable.

Vascular/Lymphatic: The abdominal aorta is normal in caliber. There
is mild atherosclerotic calcification. There is no adenopathy in the
abdomen or pelvis.

Reproductive: Hysterectomy.  No adnexal abnormalities.

Other: No acute inflammatory changes are evident in the abdomen or
pelvis. There is no adenopathy. There is no ascites.

Musculoskeletal: Multiple skeletal metastases are again evident,
including the left sacrum, right iliac crest and right
supra-acetabular ischium. These are not convincingly different from
06/10/2015.
IMPRESSION: 1. No acute findings are evident in the abdomen or pelvis.
2. New pulmonary nodules in the lung bases bilaterally.
3. Multiple skeletal metastases, including the left sacrum as well
as the right hemipelvis, not significantly changed.

## 2018-02-05 ENCOUNTER — Other Ambulatory Visit: Payer: Self-pay | Admitting: Nurse Practitioner
